# Patient Record
Sex: Male | Born: 1965 | Race: Black or African American | Hispanic: No | Marital: Married
Health system: Midwestern US, Community
[De-identification: ages and names within clinical notes are randomized; demographics above are authoritative.]

## PROBLEM LIST (undated history)

## (undated) DIAGNOSIS — Z992 Dependence on renal dialysis: Secondary | ICD-10-CM

## (undated) DIAGNOSIS — I639 Cerebral infarction, unspecified: Secondary | ICD-10-CM

## (undated) DIAGNOSIS — I10 Essential (primary) hypertension: Secondary | ICD-10-CM

## (undated) DIAGNOSIS — E785 Hyperlipidemia, unspecified: Secondary | ICD-10-CM

## (undated) DIAGNOSIS — N19 Unspecified kidney failure: Secondary | ICD-10-CM

## (undated) DIAGNOSIS — I6350 Cerebral infarction due to unspecified occlusion or stenosis of unspecified cerebral artery: Secondary | ICD-10-CM

## (undated) DIAGNOSIS — N184 Chronic kidney disease, stage 4 (severe): Secondary | ICD-10-CM

## (undated) DIAGNOSIS — M5432 Sciatica, left side: Secondary | ICD-10-CM

## (undated) HISTORY — DX: Hyperlipidemia, unspecified: E78.5

## (undated) MED ORDER — ONDANSETRON 4 MG TAB, RAPID DISSOLVE: 4 mg | ORAL_TABLET | ORAL | Status: AC

## (undated) MED ORDER — CLOPIDOGREL 75 MG TAB
75 mg | ORAL_TABLET | ORAL | Status: DC
Start: ? — End: 2013-12-13

## (undated) MED ORDER — AMLODIPINE 10 MG TAB
10 mg | ORAL_TABLET | ORAL | Status: DC
Start: ? — End: 2013-10-22

## (undated) MED ORDER — HYDRALAZINE 50 MG TAB
50 mg | ORAL_TABLET | ORAL | Status: DC
Start: ? — End: 2013-12-13

## (undated) MED ORDER — LABETALOL 200 MG TAB
200 mg | ORAL_TABLET | ORAL | Status: DC
Start: ? — End: 2013-12-13

## (undated) MED ORDER — PRAVASTATIN 10 MG TAB
10 mg | ORAL_TABLET | ORAL | Status: DC
Start: ? — End: 2013-12-13

---

## 1998-10-02 ENCOUNTER — Emergency Department (HOSPITAL_COMMUNITY): Admission: EM | Admit: 1998-10-02 | Discharge: 1998-10-02 | Payer: Self-pay | Admitting: Emergency Medicine

## 1998-10-02 ENCOUNTER — Encounter: Payer: Self-pay | Admitting: Emergency Medicine

## 1998-11-02 ENCOUNTER — Encounter: Admission: RE | Admit: 1998-11-02 | Discharge: 1998-11-02 | Payer: Self-pay | Admitting: *Deleted

## 2007-06-18 NOTE — Consults (Signed)
Name: [name], [name] Admitted: 06/18/2007  MR #: [id number] DOB: November 23, 1965  Account #: [id number] Age: 41  Consultant: Ottis Stain T. Guss Bunde, M.D. Location: 5E2 521 A     CONSULTATION REPORT    DATE OF CONSULTATION:  REFERRING PHYSICIAN:      REFERRING PHYSICIAN: Carol Ada, MD    REASON FOR CONSULTATION: Assistance with management of a patient with  advanced renal failure.    HISTORY OF PRESENT ILLNESS: Mr. [name] is a very pleasant 41 year old  African-American man who presented to the emergency room of [facility] ([facility] with complaints of persistent accelerated headaches, dizziness,  lightheadedness, and generally not feeling well. The patient was diagnosed  with hypertension several years ago. In 2006, he was told by his primary  care physician, Dr. Colvin Caroli, that his hypertension had effected his  kidneys. The patient was lost to medical followup since he lost his  medical insurance. In the interim time, the patient has been complaining  of intermittent headaches, periods of blurry vision, episodes of  lightheadedness and dizziness. He did not take any medications. He did  not have any blood work done between 2006 and now. For the last several  days, the patient has been feeling increasingly worse. Upon further  questioning, he admits that he developed nocturia 3 times per night,  polyuria, and excessive bubbling of his urine. The patient never noticed  gross hematuria. He does not have urgency, UTI symptoms. He does not have  hesitancy, incontinence or flank pain. The patient never passed a kidney  stone, but he admits having fresh blood per rectum on several occasions.  The patient is not aware of having hemorrhoids or rectal pain.    PAST MEDICAL HISTORY: Hypertension only.    ALLERGIES: No known medical allergies.    MEDICATION LIST  1. At present, the patient is taking only a large amount of nonsteroidal  anti-inflammatory medications from the class of ibuprofen and Aleve, up to   10 to 12 pills per day for control of his headaches.  2. He used to take Lotrel, the strength is unknown, but he stopped taking  it due to financial reasons.    SOCIAL HISTORY: The patient works currently at [employer]. He smokes up to 6 or 7 cigarettes per day. Drinks alcohol  occasionally, rarely. He is married, lives with his wife. The patient  denies illicit drug abuse.    FAMILY HISTORY: Strongly positive for hypertension and diabetes, but the  patient does not recall any family member on dialysis or with established  renal disease.    REVIEW OF SYSTEMS  CONSTITUTIONAL: No chills or fevers, but generalized weakness and recently  poor appetite. The patient lost some weight.  INTEGUMENT: No pruritus or easy bruisability.  HEENT: Headaches, dizziness, lightheadedness. Episodes of blurry vision.  No auditory disturbances.  NECK: No stiffness or odynophagia.  RESPIRATORY: Episodes of minimal shortness of breath, but no cough, sputum  production or hemoptysis.  CARDIOVASCULAR: The patient had at least 1 episode of chest pain. No  palpitations.  GASTROINTESTINAL: No nausea, vomiting, diarrhea, constipation. As  mentioned above, the patient had fresh blood per rectum.  MUSCULOSKELETAL: No joint pain. Patient admits having episodes of  swelling of lower extremities that had diminished significantly recently.  NEUROPSYCHIATRIC: The patient is not depressed. He denies seizure  disorder.  ENDOCRINOLOGIC: The patient denies diabetes, heat or cold intolerance.    PHYSICAL EXAMINATION  GENERAL: Young, black man who is alert  and oriented. He is well built,  well nourished. He does not appear to be in any apparent distress. Now he  is able to provide adequate history. Wife is at bedside.  VITAL SIGNS: Blood pressure 116/68, heart rate in the 80s, respirations 16  to 18.  SKIN: Warm and dry with good peripheral turgor. There are striae around   the shoulders and anterior chest, but no rashes, purpura or eczema.  HEENT: Eyes with anicteric sclerae. Mouth with moist oral mucosa. Ears  and nose are unremarkable on examination.  NECK: Supple. No increased JVP. No carotid bruit or thyromegaly.  HEART: S1, S2. Regular rate and rhythm. No friction rub or gallop.  ABDOMEN: Soft, nontender, nondistended. No hepatosplenomegaly. No CVA  tenderness.  EXTREMITIES: Without edema, cyanosis or clubbing. Peripheral pulses are  preserved.  NEUROLOGICAL: Exam shows no gross motor deficits or sensory deficits. The  patient has appropriate affect.    LABORATORY DATA: Sodium is 138, potassium 2.8, CO2 29, BUN 57,  creatinine 7.5, glucose 105. Hemoglobin 9.6, white blood count is  5.1. Lipase is 1163.    Ultrasound of the abdomen shows bilateral echogenic kidneys without  evidence of hydronephrosis.    IMPRESSIONS:  1. 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 The current level of serum creatinine could represent acute renal  failure or result of the natural progression of the patient's chronic  kidney disease. Clinically, the patient does not appear to be uremic.  Electrolytes with hypokalemia. The patient's hypokalemia could be related  to secondary hyperaldosteronism from uncontrolled hypertension. Of note,  is the patient currently has actually low normal pressure and he is  symptomatic with possibly orthostatic changes. Clinically, volume status  appears to be depleted.  2. Anemia of undetermined etiology. The patient gives a history of a  gastrointestinal bleed with blood loss, and he likely has a diminished  endogenous erythropoietin production.    RECOMMENDATIONS:  1. For his renal failure, monitor renal function with daily electrolytes   and record his urinary output. We need to establish patient's baseline  renal function. Obtain urine studies, especially protein to creatinine  ratio to rule out any primary glomerular disease.  2. For the patient's hypertension, for now I would hold Minoxidil. Given  alone, minoxidil can cause severe orthostatic hypotensive changes. The  patient's blood pressure at this level is low normal anyway. We will  decrease the Norvasc to 5 mg, start hydrochlorothiazide, and adjust  antihypertensive medications as indicated after monitoring. Check for  orthostasis.  3. Rule out primary hyperaldosteronism. Check aldosterone level and a  level after the infusion of normal saline.  4. Continue IV fluids. Change fluids to normal saline combined  with potassium chloride to replace hypokalemia. Give a dose of potassium  chloride orally and monitor potassium level.  5. For anemia. Stool cards, check for occult blood loss. Proceed with GI  evaluation. Check an iron level, replace. The patient may be a candidate  for Procrit injections.  6. Screen for secondary hyperparathyroidism with intact PTH, phosphorous,  and calcium level.    The patient and his wife were informed of possible need for renal  replacement therapy in the near future.    Avoid any nonsteroidal anti-inflammatory medications.    Thank you very much for the opportunity to be part of this patient's care.  Our service will follow up with  you closely.      E-Signed By  Jessie Foot. Guss Bunde, M.D. 07/12/2007 14:03    Fredick Schlosser T. Guss Bunde, M.D.    cc: Ottis Stain T. Guss Bunde, M.D.   Cristine Polio, MD        LTD/WMX; D: 06/18/2007 4:41 P; T: 06/18/2007 6:23 P; Job# [id number]

## 2007-06-21 NOTE — Consults (Signed)
Name: [name], [name] Admitted: 06/18/2007  MR #: [id number] DOB: 11/08/1965  Account #: [id number] Age: 41  Consultant: Bryon Lions, MD Location: PTU 402 B     CONSULTATION REPORT    DATE OF CONSULTATION: 06/21/2007  REFERRING PHYSICIAN:      REFERRING PHYSICIAN: Carol Ada, MD    HISTORY OF PRESENT ILLNESS: This is a 41 year old gentleman with an  abnormal myocardial perfusion study. We are asked to see on this basis. The  patient has been here since 11/21 with complaints of headache and  dizziness, shortness of breath, tightness in his chest, nausea, and other  complaints. He has been found to have malignant or accelerated  hypertension, severe renal failure, profound anemia, and many other  laboratory abnormalities.    The patient has not seen a doctor for 1-1/2 to 2 years, has not taken  medicines for 2 years on the basis of his loss of his medical insurance. He  was established as hypertensive prior to this, and there was some mention  of renal issues then but not to this level. There is no personal history of  myocardial infarction, stroke, or rheumatic fever.    The patient smokes about 1/3 to 1/2 pack of cigarettes per day. He drinks  alcohol daily. He has not used any illegal drugs in several months.    ALLERGIES TO MEDICATIONS: None.    There is no formal diagnosis of asthma or emphysema or sleep apnea,  although the patient's wife says that he snores very loudly.    SOCIAL HISTORY: The patient is married, lives with his wife. He has 3  children including an infant less than a year old. He works at [employer]. Again, he is on no medications. His dizziness and lightheadedness and  headaches have improved since admission.    As part of his evaluation the patient had a myocardial perfusion study with  Persantine and was found to have a low ejection fraction on both the  resting and stress images and an area "suspicious for" mild anterior  ischemia.     REVIEW OF SYSTEMS: Negative for dyspnea on ordinary exertion, orthopnea,  paroxysmal nocturnal dyspnea, diaphoresis, frank syncope, edema, abdominal  pain. He dose have frequent episodes of nausea and vomiting and was having  bright red blood per rectum up until 2 weeks ago when he stopped taking so  many aspirin and other medications for pain. He apparently has not seen any  bright red blood since admission.    PHYSICAL EXAMINATION:  GENERAL: This is a well-developed, very affable, 41 year old gentleman in  no respiratory distress with unlabored breathing.  VITAL SIGNS: Include a blood pressure of 201/120, pulse of 88, respirations  18, temperature 97.8, saturations 98% on room air.  HEENT: Unremarkable. His sclerae are not icteric.  NECK: Supple. Normal carotid pulsations bilaterally. No bruits. No  adenopathy. Normal thyroid.  CHEST WALL: Nontender.  LUNGS: Scattered expiratory wheezes.  HEART: Regular, moderate rhythm. Moderate S4 gallop. No S3 gallop. No  friction rub. No neck vein distention. No hepatojugular reflux, no thrills,  lifts, or heaves.  ABDOMEN: Slightly distended, soft, nontender. No bruits, no organomegaly.  NEUROLOGIC: Normal.  EXTREMITIES: Trace pretibial edema. Normal pedal pulses. Normal brachial  pulses, normal radial pulses.  NEUROLOGIC: Nonfocal and unremarkable.    EKG: Left ventricular hypertrophy, nondiagnostic ST-T changes.    CHEST X-RAY: Mild cardiomegaly, prominence of aortic arch.    FOLLOWUP CT: The chest showed no aneurysm of the thoracic  aorta.    LABORATORY WORK: Indicates a very high creatinine; it was 8.6 on 11/22.  Most recent one is 7.5. His hemoglobin is down to 8.6, hematocrit 24.9,  platelet count 110,000. White count is normal. LDL cholesterol is 100.8,  HDL cholesterol 37.    IMPRESSION:  1. This patient has an abnormal myocardial perfusion study suggesting  coronary artery disease and coronary insufficiency; in addition, his left  ventricular function is depressed.   2. The patient has advanced renal failure, and his creatinine has improved  some from admission to its current level but it is still 7.5 creatinine.  3. Thrombocytopenia. He also has an anemia and demonstrated bright red  blood per rectum until 2 weeks ago. He also has substance issues with heavy  tobacco and alcohol use.    RECOMMENDATIONS: Medical therapy for his coronary disease for now including  a significant dose of beta-blockers, aspirin, and Plavix if his GI  evaluation is negative for active GI bleeding. If the patient has  breakthrough symptoms on medical therapy, then he should have coronary  angiography but this will guarantee renal failure and dialysis. A GI  evaluation should be done and he should also get transfusions for  hemoglobin less than 8. I will explain these issues to the patient and  follow along with you.    Thank you for this referral.      E-Signed By  Bryon Lions, MD 08/01/2007 13:20    Bryon Lions, MD    cc: Bryon Lions, MD   Cristine Polio, MD        SNA/WMX; D: 06/21/2007 8:17 P; T: 06/21/2007 9:37 P; Job# [id number]

## 2007-08-04 NOTE — Op Note (Signed)
Name: [name], [name]  MR #: [id number] Surgeon: Levonne Spiller,   M.D.  Account #: [id number] Surgery Date: 06/22/2007  DOB: 1966/07/27  Age: 42 Location: PTU 402 B     OPERATIVE REPORT      PROCEDURE: Esophagogastroduodenoscopy with biopsy.    PREOPERATIVE DIAGNOSIS: A 42 year old male with history of nausea and  vomiting over 10- to 12-day period with 10 to 12 episodes per day. The  patient is anemic, has a history of renal failure. H and P was done prior  to examination.    ANESTHESIA RISK: 3.    AIRWAY ASSESSMENT: 2.    MEDICATIONS: Fentanyl and Versed. See my notes for dosages.    MONITORING: Conscious anesthesia monitored including O2 saturation, blood  pressure, airway, and EKG monitoring, all remained stable during the  examination.    ANESTHESIA PRE-CHECK AND PLAN: No change. Time-out occurred.    CONSENT: Complications of this procedure were discussed including  perforation and need for surgery.    DESCRIPTION OF PROCEDURE: With the patient in the left lateral position,  an Olympus video endoscope was passed without difficulty from the patient's  oropharynx into the patient's esophagus. Oropharynx was unremarkable.  Esophagus throughout its entirety was unremarkable. No significant  esophagitis was noted. This was passed past the EG junction into the  patient's stomach. There was some mild erythema noted in the antrum of the  patient's stomach, but no evidence of any ulcerations. The instrument was  passed through the pylorus into the duodenum. The duodenum was  unremarkable in the bulb and 2nd portion of the duodenum. The instrument  was pulled back into the stomach and retroflexed. No additional findings  were noted in the fundus or cardia. Representative biopsies were taken for  Helicobacter pylori testing. The instrument was withdrawn. The patient  tolerated the entire procedure well.    ESTIMATED BLOOD LOSS:    SPECIMENS REMOVED:          E-Signed By   Levonne Spiller, M.D. 08/04/2007 13:52  Levonne Spiller, M.D.    cc: Cristine Polio, MD   Levonne Spiller, M.D.      HJS/FI3; D: 08/03/2007 11:48 P; T: 08/04/2007 5:11 A; Job# [id number]

## 2007-08-04 NOTE — Op Note (Signed)
Op Notes signed by  at 08/04/07 1352                  Author: Charolette Forward, MD  Service: --  Author Type: Physician            Filed:   Date of Service: 08/04/07 0511  Status: Signed          <!--EPICS--> Name:      [name], Alexander S.<BR> MR #:      [id number]                    Surgeon:        Levonne Spiller, <BR> M.D.<BR> Account  #: [id number]                 Surgery Date:   06/22/2007<BR> DOB:       1965/10/07<BR> Age:       42                           Location:       PTU 402 B<BR> <BR>                              OPERATIVE REPORT<BR> <BR> <BR> PROCEDURE:  Esophagogastroduodenoscopy  with biopsy.<BR> <BR> PREOPERATIVE DIAGNOSIS:  A 42 year old male with history of nausea and<BR> vomiting over 10- to 12-day period with 10 to 12 episodes per day.  The<BR> patient is anemic, has a history of renal failure.  H and P was done prior<BR>  to examination.<BR> <BR> ANESTHESIA RISK:  3.<BR> <BR> AIRWAY ASSESSMENT:  2.<BR> <BR> MEDICATIONS:  Fentanyl and Versed.  See my notes for dosages.<BR> <BR> MONITORING:  Conscious anesthesia monitored including O2 saturation, blood<BR> pressure, airway,  and EKG monitoring, all remained stable during the<BR> examination.<BR> <BR> ANESTHESIA PRE-CHECK AND PLAN:  No change.  Time-out occurred.<BR> <BR> CONSENT:  Complications of this procedure were discussed including<BR> perforation and need for surgery.<BR>  <BR> DESCRIPTION OF PROCEDURE:  With the patient in the left lateral position,<BR> an Olympus video endoscope was passed without difficulty from the patient's<BR> oropharynx into the patient's esophagus.  Oropharynx was unremarkable.<BR> Esophagus throughout  its entirety was unremarkable.  No significant<BR> esophagitis was noted.  This was passed past the EG junction into the<BR> patient's stomach.  There was some mild erythema noted in the antrum of the<BR> patient's stomach, but no evidence of any ulcerations.   The instrument was<BR> passed through the  pylorus into the duodenum.  The duodenum was<BR> unremarkable in the bulb and 2nd portion of the duodenum.  The instrument<BR> was pulled back into the stomach and retroflexed.  No additional findings<BR> were  noted in the fundus or cardia.  Representative biopsies were taken for<BR> Helicobacter pylori testing.  The instrument was withdrawn.  The patient<BR> tolerated the entire procedure well.<BR> <BR> ESTIMATED BLOOD LOSS:<BR> <BR> SPECIMENS REMOVED:<BR>  <BR> <BR> <BR> <BR> E-Signed By<BR> Levonne Spiller, M.D. 08/04/2007 13:52<BR> Levonne Spiller, M.D.<BR> <BR> cc:   Desmond Dike Haithcock, MD<BR>       Levonne Spiller, M.D.<BR> <BR> <BR> HJS/FI3; D: 08/03/2007 11:48 P; T: 08/04/2007  5:11 A; Job#  [id number]<OZ> <!--EPICE-->

## 2007-09-19 LAB — CBC WITH AUTOMATED DIFF
ABS. BASOPHILS: 0 10*3/uL (ref 0.0–0.1)
ABS. EOSINOPHILS: 0.1 10*3/uL (ref 0.0–0.4)
ABS. LYMPHOCYTES: 1.4 10*3/uL (ref 0.8–3.5)
ABS. MONOCYTES: 0.6 10*3/uL (ref 0–1.0)
ABS. NEUTROPHILS: 1.9 10*3/uL (ref 1.8–8.0)
BASOPHILS: 1 % (ref 0–1)
EOSINOPHILS: 2 % (ref 0–7)
HCT: 34.6 % — ABNORMAL LOW (ref 36.6–50.3)
HGB: 11.5 g/dL — ABNORMAL LOW (ref 12.1–17.0)
LYMPHOCYTES: 35 % (ref 12–49)
MCH: 31.1 PG (ref 26.0–34.0)
MCHC: 33.2 g/dL (ref 30.0–35.0)
MCV: 93.5 FL (ref 80.0–99.0)
MONOCYTES: 14 % — ABNORMAL HIGH (ref 5–13)
NEUTROPHILS: 48 % (ref 32–75)
PLATELET: 224 10*3/uL (ref 150–400)
RBC: 3.7 M/uL — ABNORMAL LOW (ref 4.10–5.70)
RDW: 13.3 % (ref 11.5–14.5)
WBC: 4 10*3/uL — ABNORMAL LOW (ref 4.1–11.1)

## 2007-09-19 LAB — TROPONIN I: Troponin-I, Qt.: 0.11 ng/mL — ABNORMAL HIGH (ref 0.0–0.05)

## 2007-09-19 LAB — METABOLIC PANEL, COMPREHENSIVE
A-G Ratio: 0.8 — ABNORMAL LOW (ref 1.1–2.2)
ALT (SGPT): 30 U/L (ref 30–65)
AST (SGOT): 21 U/L (ref 15–37)
Albumin: 3.7 g/dL (ref 3.5–5.0)
Alk. phosphatase: 85 U/L (ref 50–136)
Anion gap: 7 (ref 5–15)
BUN/Creatinine ratio: 7 — ABNORMAL LOW (ref 12–20)
BUN: 38 MG/DL — ABNORMAL HIGH (ref 6–20)
Bilirubin, total: 0.3 MG/DL (ref 0–1.0)
CO2: 28 MMOL/L (ref 21–32)
Calcium: 8.7 MG/DL (ref 8.5–10.1)
Chloride: 100 MMOL/L (ref 97–108)
Creatinine: 5.8 MG/DL — ABNORMAL HIGH (ref 0.6–1.3)
GFR est AA: 14 mL/min/{1.73_m2} — ABNORMAL LOW (ref >60)
GFR est non-AA: 12 mL/min/{1.73_m2} — ABNORMAL LOW (ref >60)
Globulin: 4.6 g/dL — ABNORMAL HIGH (ref 2.0–4.0)
Glucose: 85 MG/DL (ref 75–110)
Potassium: 3 MMOL/L — ABNORMAL LOW (ref 3.5–5.1)
Protein, total: 8.3 g/dL — ABNORMAL HIGH (ref 6.4–8.2)
Sodium: 135 MMOL/L — ABNORMAL LOW (ref 136–145)

## 2007-09-19 LAB — CK-MB,QUANT.
CK - MB: 1.5 NG/ML (ref 0–5)
CK-MB Index: 0.5 (ref 0–2.5)

## 2007-09-19 LAB — CK W/ REFLX CKMB: CK: 284 U/L — ABNORMAL HIGH (ref 35–232)

## 2007-09-19 NOTE — Consults (Signed)
Name: AZZAN, BUTLER Admitted: 09/19/2007  MR #: [id number] DOB: [birth year]  Account #: [id number] Age: 42  Consultant: Sharon Seller A. Colvin Caroli, M.D. Location:     CONSULTATION REPORT    DATE OF CONSULTATION:  REFERRING PHYSICIAN:      ADDENDUM    His laboratory values are as follows (which I did not mention in my initial  evaluation): Hemoglobin is 11.5, WBC 4.0, MCV was 93.5. Abnormalities on  the comprehensive panel limited to potassium of 3.0, BUN and creatinine 38  and 5.8, respectively. CO2 was 28. Total protein 8.3, globulin 4.6. CK  was elevated at 284 with normal isoenzymes. Troponin was slightly  elevated, and we will repeat this prior to discharge to make sure that the  trend is not one of increasing.      E-Signed By  Christean Grief Colvin Caroli, M.D. 09/22/2007 12:51    Sharon Seller A. Colvin Caroli, M.D.    cc: Christean Grief. Colvin Caroli, M.D.        Kipp Brood; D: 09/19/2007 4:00 P; T: 09/19/2007 5:03 P; DOC# [id number]; Job#  [id number]

## 2007-09-19 NOTE — Consults (Signed)
Name: [name], [name] Admitted: 09/19/2007  MR #: [id number] DOB: 17-Jan-1966  Account #: [id number] Age: 42  Consultant: Sharon Seller A. Colvin Caroli, M.D. Location:     CONSULTATION REPORT    DATE OF CONSULTATION: 09/19/2007  REFERRING PHYSICIAN:      HISTORY OF PRESENT ILLNESS: The patient is a 42 year old gentleman who has  a history of severe primary hypertension. Noted on Monday, which was 6 days  ago, blurred vision, initially involved the right eye, but subsequently  began involving both eyes, and did not involve his entire visual field.  Based on his history, it sounded like he indeed had visual field cuts. He  saw his optometrist/ophthalmologist on Friday where the exam was normal.  Because of the persistence of the symptoms, and the new involvement of the  contralateral eye, he presented to the emergency room. When I evaluated  him, there was an obvious visual field defect that existed in both eyes.  His acuity was adequate in the portion of his visual field that was not  involved.    He has severe high blood pressure and is on polydrug therapy. When he saw  his optometrist/ophthalmologist on Friday, it was suggested that the  current symptoms might be medication related, and he immediately  discontinued the majority of them. Blood pressure was markedly elevated  today presumably as a direct result of this. Historically, the patient  states that with his current blood pressure regimen, his pressures  systolically have generally been in the 140 range and diastolics have  generally been in the mid 90 range.    PAST MEDICAL HISTORY: As a direct result of his long-standing high blood  pressure, he has developed left ventricular hypertrophy, fairly severe.  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  He is being followed by Dr. Guss Bunde of nephrology.     During his last hospitalization, at which time the severity of this renal  failure was manifest, he had esophagogastroduodenoscopy which revealed  gastritis, a stress Cardiolite scan which was negative, CT scan of the  chest which revealed tortuosity of the aorta, abdominal ultrasound which is  negative. It should be known that the patient's secondary complications  were all related to his noncompliance. I saw him in the office years ago,  and at that time he had very mild elevation of his creatinine and had a  good opportunity to arrest the process, but he never followed up and as a  result never took any blood pressure medications on a consistent basis.    SOCIAL HISTORY: He is married and has 3 children. He formerly worked at  [employer], but unfortunately lost his job most recently. He smokes  cigarettes, approximately a third of a pack a day, and drinks daily, 1 or 2  beers at best, and this may have changed of late.    ALLERGIES: He has no known allergies.    CURRENT MEDICATIONS INCLUDE:  1. Omeprazole 20 mg daily.  2. Amlodipine 10 mg daily.  3. Hydralazine 40 mg t.i.d.  4. Metoprolol tartrate 50 mg b.i.d.  5. Rocaltrol 0.25 mcg daily.    REVIEW OF SYSTEMS: HEENT: See history of present illness.  CARDIOVASCULAR: See past medical history. PULMONARY: Negative.  GASTROINTESTINAL: Negative. GENITOURINARY: Negative. NEUROLOGICAL: See  history of present illness. MUSCULOSKELETAL: Negative.    FAMILY HISTORY: Father died at age 108 from cancer of the lung. Mom died  at 54 from breast cancer. He has 2 brothers alive and well.    PHYSICAL EXAMINATION:  VITAL SIGNS: Temperature 98.4 degrees Fahrenheit, pulse 80, respiratory  rate 16, initial blood pressure 215/138.  GENERAL: The patient is a 42 year old well-developed, well-nourished,  unhealthy-looking male in no acute respiratory distress at rest. He is  alert and oriented x3, cooperative, and quite pleasant.  SKIN: Warm, dry, without pallor or cyanosis.   HEENT: Pupils equal, round, and reactive to light. He has fair  dentition.  NECK: There is no JVD, HJR, thyromegaly, carotid bruits, or  lymphadenopathy noted.  CHEST: Clear to A and P.  HEART: PMI is not palpable. S1 and S2 within normal limits. No murmurs  or gallops noted.  ABDOMEN: Soft, without obvious organomegaly, masses, or areas of  tenderness noted.  EXTREMITIES: Pulses full and symmetrical.  NEUROLOGIC: No focality is noted to his exam. In attempting to do visual  fields, there is a definite visual field cut midline involving the medial  aspect on the right and the lateral aspect on the left.    IMPRESSION:  1. 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  2. Severe pulmonary hypertension, currently elevated, secondary to  noncompliance.  3. Left ventricular hypertrophy secondary to long-standing uncontrolled  hypertension.  4. Chronic renal failure secondary to hypertensive nephrosclerosis.    DISPOSITION: Given the onset of the symptoms, which is over a week ago,  with progression only to the contralateral eye, a CT scan of the brain will  be obtained. If there is no hemorrhagic bleed as well as no other  unsuspecting defect, the patient will be discharged home to follow up as an  outpatient. He is already on aspirin 81 mg which I will increase to 325  mg. He will return to the office on Monday for followup.    Poorly controlled blood pressure is related to his noncompliance, and the  patient will resume is antihypertensive medication, and given the fact that  during the week and earlier his pressures had been in a reasonable range,  systolics in the 140s and diastolics in the 90 range, I do not anticipate  him not being able to get back to that baseline with resumption of his  current regimen. Prior to discharge, however, he was given clonidine and I  anticipate a modest drop.      E-Signed By  Christean Grief Colvin Caroli, M.D. 09/22/2007 12:51     Sharon Seller A. Colvin Caroli, M.D.    cc: Christean Grief. Colvin Caroli, M.D.        Kipp Brood; D: 09/19/2007 3:47 P; T: 09/19/2007 5:28 P; DOC# [id number]; Job#  [id number]

## 2007-09-20 LAB — TROPONIN I: Troponin-I, Qt.: 0.1 ng/mL — ABNORMAL HIGH (ref 0.0–0.05)

## 2008-09-01 LAB — CELL COUNT, CSF
CSF RBCs: 1 /mm3 — ABNORMAL HIGH
CSF RBCs: 17 /mm3 — ABNORMAL HIGH
CSF TUBE NO.: 1
CSF TUBE NO.: 3
CSF WBCs: 0 /mm3 (ref 0–5)
CSF WBCs: 3 /mm3 (ref 0–5)

## 2008-09-01 LAB — GLUCOSE, CSF
Glucose,CSF: 65 MG/DL (ref 40–70)
Tube No.: 3

## 2008-09-01 LAB — GLUCOSE, RANDOM: Glucose: 92 MG/DL (ref 50–100)

## 2008-09-01 LAB — PROTEIN, CSF
Protein,CSF: 33 MG/DL (ref 15–45)
Tube No.: 3

## 2008-09-01 NOTE — Consults (Signed)
Name: [name], [name] Admitted: 09/01/2008  MR #: [id number] DOB: 12/07/1965  Account #: [id number] Age: 43  Consultant: Clydene Laming, M.D. Location:     CONSULTATION REPORT    DATE OF CONSULTATION:  REFERRING PHYSICIAN:      REFERRING PHYSICIAN:    The patient had an abnormal neurologic examination with visual loss,  distortion of vision. He had imaging studies of the brain a year ago that  showed temporal lobe inflammation that appeared to be demyelinating. The  patient had no major symptoms of this type of visual change until late last  year. For that reason, we have scheduled lumbar puncture several times to  try to help coordinate, to try to set up an LP. Today this was accomplished  to rule out any additional signs of demyelination. He has not have MRI  since last year.    We did complete LP without complications, immediate type. An 18-gauge  needle was passed with standard sterile procedure to CSF acquisition.  Approximately 8 to 10 mL of spinal fluid was obtained. Opening pressure was  200 mm of water, closing pressure was 180 mm of water. The fluid cleared  after the first slightly blood painted tube. No xanthochromia was seen.  The patient's blood work was sent for standard MS panel to correlate with  the patient's CSF. The patient tolerated the procedures without any  immediate consequences outside of mild anxiety reaction following the  procedure. He tolerated it without any other major problems.    His wife was present throughout the whole procedure and was quite a helpful  assistant as we obtained patient's CSF.        Reviewed on 09/01/2008 1:10 PM      E-Signed By  Clydene Laming, M.D. 09/04/2008 09:49    J. Jerrilyn Cairo, M.D.    cc: Christean Grief. Colvin Caroli, M.D.   Clydene Laming, M.D.        JKH/WMX; D: 09/01/2008 1:00 P; T: 09/01/2008 1:56 P; DOC# [id number]; Job#  [id number]

## 2008-09-06 LAB — MULTIPLE SCLEROSIS EVAL
ALBUMIN INDEX(CSF IgG): 4.7 ratio (ref 0.0–9.0)
ALBUMIN,SERUM: 3840 mg/dL (ref 3500–5200)
Albumin, CSF: 18 mg/dL (ref 0–35)
CSF IgG/ALB RATIO: 0.18 ratio (ref 0.09–0.25)
CSF OLIGOCLONAL BANDS: NEGATIVE
IMMUNOGLOBULIN G: 1780 mg/dL — ABNORMAL HIGH (ref 768–1632)
IgG INDEX: 0.4 ratio (ref 0.28–0.66)
IgG SYNTHESIS RATE: <0 mg/d (ref <=8.0)
Immunoglobulin G,CSF: 3.3 mg/dL (ref 0.0–6.0)
MYELIN BASIC PROTEIN: 0.46 ng/mL (ref 0.00–1.10)

## 2009-07-18 DIAGNOSIS — I1310 Hypertensive heart and chronic kidney disease without heart failure, with stage 1 through stage 4 chronic kidney disease, or unspecified chronic kidney disease: Secondary | ICD-10-CM

## 2009-07-18 NOTE — Progress Notes (Signed)
TRANSFER - IN REPORT:    Verbal report received from Kristen RN(name) on [name]  being received from ED(unit) for routine progression of care      Report consisted of patient???s Situation, Background, Assessment and   Recommendations(SBAR).     Information from the following report(s) ED Summary and Recent Results was reviewed with the receiving nurse.    Opportunity for questions and clarification was provided.      Assessment completed upon patient???s arrival to unit and care assumed.

## 2009-07-18 NOTE — ED Notes (Signed)
Pt was brought in by squad for chest pain and right arm pain. Pt has received 1 SL NTG with no relief. Pt is having nausea and SOB. Pt cannot take Asprin due to renal failure. STEMI not initiated in the field.

## 2009-07-18 NOTE — H&P (Signed)
History and Physical    Subjective:     [name] is a 43 y.o. African American male who presents with R arm tightness and very elevated BP. He has not been taking his meds appropriately and also has been eating a high salt diet. Pt denies chest pain, dyspnea, and edema.    Past Medical History   Diagnosis Date   ??? Chronic kidney disease    ??? Hypertension    ??? Anemia    ??? Coronary artery disease         Past Surgical History   Procedure Date   ??? Abdomen surgery proc unlisted    ??? Hx orthopaedic    ??? Pr acne surgery of skin abscess        No family history on file.   History   Substance Use Topics   ??? Smoking status: Current Everyday Smoker -- 0.2 packs/day   ??? Smokeless tobacco: Not on file   ??? Alcohol Use: 1.5 oz/week     3 Cans of beer per week         Prior to Admission medications    Medication Sig Start Date End Date Taking? Authorizing Provider   AMLODIPINE BESYLATE (NORVASC PO) Take 20 mg by mouth daily. Indications: HYPERTENSION 07/18/09  Yes Phys Other, MD   lisinopril (PRINIVIL, ZESTRIL) 20 mg tablet Take  by mouth daily. Indications: HYPERTENSION   Yes Phys Other, MD   hydrALAZINE (APRESOLINE) 50 mg tablet Take 50 mg by mouth three (3) times daily. Indications: HYPERTENSION   Yes Phys Other, MD   metoprolol (LOPRESSOR) 25 mg tablet Take 50 mg by mouth two (2) times a day. Indications: HYPERTENSION   Yes Phys Other, MD   clopidogrel (PLAVIX) 75 mg tablet Take 75 mg by mouth daily. Indications: UNSTABLE ANGINA PECTORIS   Yes Phys Other, MD       No Known Allergies     Review of Systems:  A comprehensive review of systems was negative except for that written in the History of Present Illness.     Objective:     Intake and Output:            Physical Exam:   BP 187/120   Pulse 74   Resp 16   Ht 6'   Wt 186 lb   SpO2 100%  General appearance: alert, cooperative, no distress, appears stated age   Neck: supple, symmetrical, trachea midline, no adenopathy, thyroid: not enlarged, symmetric, no tenderness/mass/nodules, no carotid bruit and no JVD  Lungs: clear to auscultation bilaterally  Heart: regular rate and rhythm, S1, S2 normal, no murmur, click, rub or gallop  Abdomen: soft, non-tender. Bowel sounds normal. No masses,  no organomegaly  Extremities: extremities normal, atraumatic, no cyanosis or edema  Neurologic: Grossly normal    ECG:  LVH, strain pattern     Data Review:   Recent Results (from the past 24 hour(s))   CBC WITH AUTOMATED DIFF    Collection Time    07/18/09  8:40 PM   Component Value Range   ??? WBC 6.4  4.1 - 11.1 (K/uL)   ??? RBC 3.70 (*) 4.10 - 5.70 (M/uL)   ??? HGB 11.6 (*) 12.1 - 17.0 (g/dL)   ??? HCT 34.4 (*) 36.6 - 50.3 (%)   ??? MCV 93.0  80.0 - 99.0 (FL)   ??? MCH 31.4  26.0 - 34.0 (PG)   ??? MCHC 33.7  30.0 - 36.5 (g/dL)   ??? RDW  13.7  11.5 - 14.5 (%)   ??? PLATELET 174  150 - 400 (K/uL)   ??? NEUTROPHILS 63  32 - 75 (%)   ??? LYMPHOCYTES 26  12 - 49 (%)   ??? MONOCYTES 9  5 - 13 (%)   ??? EOSINOPHILS 2  0 - 7 (%)   ??? BASOPHILS 0  0 - 1 (%)   ??? ABSOLUTE NEUTS 4.0  1.8 - 8.0 (K/UL)   ??? ABSOLUTE LYMPHS 1.7  0.8 - 3.5 (K/UL)   ??? ABSOLUTE MONOS 0.6  0.0 - 1.0 (K/UL)   ??? ABSOLUTE EOSINS 0.1  0.0 - 0.4 (K/UL)   ??? ABSOLUTE BASOS 0.0  0.0 - 0.1 (K/UL)   CK W/ REFLX CKMB    Collection Time    07/18/09  8:40 PM   Component Value Range   ??? CK 209  35 - 232 (U/L)   TROPONIN I    Collection Time    07/18/09  8:40 PM   Component Value Range   ??? Troponin-I, Qt. 0.12 (*) <0.05 (ng/mL)   METABOLIC PANEL, COMPREHENSIVE    Collection Time    07/18/09  8:40 PM   Component Value Range   ??? Sodium 140  136 - 145 (MMOL/L)   ??? Potassium 4.0  3.5 - 5.1 (MMOL/L)   ??? Chloride 109 (*) 97 - 108 (MMOL/L)   ??? CO2 19 (*) 21 - 32 (MMOL/L)   ??? Anion gap 12  5 - 15 (mmol/L)   ??? Glucose 87  65 - 100 (MG/DL)   ??? BUN 29 (*) 6 - 20 (MG/DL)   ??? Creatinine 4.3 (*) 0.6 - 1.3 (MG/DL)   ??? BUN/Creatinine ratio 7 (*) 12 - 20 ( )    ??? GFR est AA 20 (*) >60 (ml/min/1.28m2)   ??? GFR est non-AA 16 (*) >60 (ml/min/1.68m2)   ??? Calcium 8.7  8.5 - 10.1 (MG/DL)   ??? Bilirubin, total 0.3  0.2 - 1.0 (MG/DL)   ??? ALT 19  12 - 78 (U/L)   ??? AST 20  15 - 37 (U/L)   ??? Alk. phosphatase 55  50 - 136 (U/L)   ??? Protein, total 8.3 (*) 6.4 - 8.2 (g/dL)   ??? Albumin 3.8  3.5 - 5.0 (g/dL)   ??? Globulin 4.5 (*) 2.0 - 4.0 (g/dL)   ??? A-G Ratio 0.8 (*) 1.1 - 2.2 ( )   PTT    Collection Time    07/18/09  8:40 PM   Component Value Range   ??? aPTT 29.4  24.0 - 33.0 (sec)   PROTHROMBIN TIME    Collection Time    07/18/09  8:40 PM   Component Value Range   ??? INR 1.0  0.9 - 1.1 ( )   ??? Prothrombin Time-PT 10.7  9.0 - 11.0 (SECS)   BNP    Collection Time    07/18/09  8:40 PM   Component Value Range   ??? B-NP 116 (*) 0 - 100 (pg/mL)   POC TROPONIN-I    Collection Time    07/18/09  8:41 PM   Component Value Range   ??? Troponin-I (POC) 0.04  0.00 - 0.08 (ng/mL)             Assessment:     Patient Active Hospital Problem List:  *Accelerated hypertension (07/18/2009)      Plan:     1) Accelerated Hypertension  2) Get back on previous PO meds and on a  low salt diet.   3) Moniter, R/O MI    Signed By: Barron Schmid, MD     July 18, 2009

## 2009-07-18 NOTE — ED Provider Notes (Addendum)
HPI Comments: This is a 43 year old male with a previous medical hx significant for chronic kidney disease, HTN, and anemia who presents to the ED via EMS for chest flutter and arm pain.  Pt reports constant right arm pain for the past 1 hour which began at work, described as a dull ache, rated as 7/10.  Pt also reports fluttering in his chest.  Pt also reports dizziness and lightheadedness.  EMS reports pt was found to have BP of 222/156, given 1 Nitro, brought down to 190/146.  Pt is followed by Dr. Brooke Bonito.  Pt denies chest pain, SOB, abdominal pain, headache, cough, congestion, and rhinorrhea.  Note written by Larose Hires, Scribe, as dictated by Christella Noa., MD 8:42 PM      The history is provided by the patient and the EMS personnel.        Past Medical History   Diagnosis Date   ??? Chronic kidney disease    ??? Hypertension    ??? Anemia           Past Surgical History   Procedure Date   ??? Abdomen surgery proc unlisted    ??? Hx orthopaedic    ??? Pr acne surgery of skin abscess            No family history on file.     History   Social History   ??? Marital Status: Married     Spouse Name: N/A     Number of Children: N/A   ??? Years of Education: N/A   Occupational History   ??? Not on file.   Social History Main Topics   ??? Smoking status: Current Everyday Smoker -- 0.2 packs/day   ??? Smokeless tobacco: Not on file   ??? Alcohol Use: 1.5 oz/week     3 Cans of beer per week   ??? Drug Use: Yes     Special: Marijuana   ??? Sexually Active:    Other Topics Concern   ??? Not on file   Social History Narrative   ??? No narrative on file           ALLERGIES: Review of patient's allergies indicates no known allergies.      Review of Systems   Constitutional: Negative for fever, chills and appetite change.   HENT: Negative for congestion, rhinorrhea, neck pain and neck stiffness.    Eyes: Negative for discharge and redness.   Respiratory: Negative for cough, shortness of breath and stridor.     Cardiovascular: Positive for chest pain and palpitations. Negative for leg swelling.   Gastrointestinal: Negative for nausea, vomiting, abdominal pain and diarrhea.   Genitourinary: Negative for dysuria, hematuria, flank pain and decreased urine volume.   Neurological: Positive for dizziness and light-headedness. Negative for syncope, weakness and headaches.   Psychiatric/Behavioral: Negative for behavioral problems, confusion and agitation.   Note written by Larose Hires, Scribe, as dictated by Christella Noa., MD 8:43 PM    Filed Vitals:    07/18/2009  8:30 PM   BP: 212/129   Pulse: 72   Resp: 24   Height: 6' (1.829 m)   Weight: 186 lb (84.369 kg)   SpO2: 100%              Physical Exam   Constitutional: He is oriented to person, place, and time. He appears well-developed and well-nourished.   HENT:   Head: Normocephalic and atraumatic.   Mouth/Throat: Oropharynx is clear and moist. No oropharyngeal exudate.  Eyes: Conjunctivae are normal. No scleral icterus.   Neck: Neck supple. No thyromegaly present.   Cardiovascular: Normal rate, regular rhythm and normal heart sounds.  Exam reveals no gallop and no friction rub.    No murmur heard.  Pulmonary/Chest: Effort normal and breath sounds normal. No stridor. No respiratory distress. He has no wheezes. He has no rales.   Abdominal: Soft. Bowel sounds are normal. No tenderness. He has no rebound and no guarding.   Musculoskeletal: Normal range of motion.   Lymphadenopathy:     He has no cervical adenopathy.   Neurological: He is alert and oriented to person, place, and time.   Skin: Skin is warm and dry.   Psychiatric: He has a normal mood and affect.        Coding    Procedures    8:44 PM  EKG: (ED MD Interpretation)  NSR, 66, LVH with strain.  Note written by Larose Hires, Scribe, as dictated by Christella Noa., MD 8:45 PM    10:01 PM  CONSULT NOTE:   Jceon Alverio D., MD spoke to Dr. Bing Ree concerning the patient. The patient's history, presentation, physical findings, and results were all discussed. Will come to see and admit.  Note written by Larose Hires, Scribe, as dictated by Christella Noa., MD 10:02 PM

## 2009-07-18 NOTE — ED Notes (Signed)
Pt has urinal at bedside. Pt states he is unable to void at this time.

## 2009-07-18 NOTE — Progress Notes (Signed)
TRANSFER - OUT REPORT:    Verbal report given to Ebony on Carla Whilden Rainwater  being transferred to PTU 406A for routine progression of care       Report consisted of patient???s Situation, Background, Assessment and   Recommendations(SBAR).     Information from the following report(s) SBAR, ED Summary, [facility] and Recent Results was reviewed with the receiving nurse.    Opportunity for questions and clarification was provided.

## 2009-07-19 ENCOUNTER — Inpatient Hospital Stay
Admit: 2009-07-19 | Discharge: 2009-07-20 | Disposition: A | Discharge status: Home / Self Care | Payer: PRIVATE HEALTH INSURANCE | Attending: Internal Medicine | Admitting: Internal Medicine

## 2009-07-19 LAB — URINALYSIS W/ REFLEX CULTURE
Bacteria: NEGATIVE /HPF
Bilirubin: NEGATIVE
Blood: NEGATIVE
Glucose: NEGATIVE MG/DL
Ketone: NEGATIVE MG/DL
Leukocyte Esterase: NEGATIVE
Nitrites: NEGATIVE
Protein: 30 MG/DL — AB
Specific gravity: 1.02 (ref 1.003–1.030)
Urobilinogen: 0.2 EU/DL (ref 0.2–1.0)
pH (UA): 5.5 (ref 5.0–8.0)

## 2009-07-19 LAB — METABOLIC PANEL, COMPREHENSIVE
A-G Ratio: 0.8 — ABNORMAL LOW (ref 1.1–2.2)
ALT (SGPT): 19 U/L (ref 12–78)
AST (SGOT): 20 U/L (ref 15–37)
Albumin: 3.8 g/dL (ref 3.5–5.0)
Alk. phosphatase: 55 U/L (ref 50–136)
Anion gap: 12 mmol/L (ref 5–15)
BUN/Creatinine ratio: 7 — ABNORMAL LOW (ref 12–20)
BUN: 29 MG/DL — ABNORMAL HIGH (ref 6–20)
Bilirubin, total: 0.3 MG/DL (ref 0.2–1.0)
CO2: 19 MMOL/L — ABNORMAL LOW (ref 21–32)
Calcium: 8.7 MG/DL (ref 8.5–10.1)
Chloride: 109 MMOL/L — ABNORMAL HIGH (ref 97–108)
Creatinine: 4.3 MG/DL — ABNORMAL HIGH (ref 0.6–1.3)
GFR est AA: 20 mL/min/{1.73_m2} — ABNORMAL LOW (ref >60)
GFR est non-AA: 16 mL/min/{1.73_m2} — ABNORMAL LOW (ref >60)
Globulin: 4.5 g/dL — ABNORMAL HIGH (ref 2.0–4.0)
Glucose: 87 MG/DL (ref 65–100)
Potassium: 4 MMOL/L (ref 3.5–5.1)
Protein, total: 8.3 g/dL — ABNORMAL HIGH (ref 6.4–8.2)
Sodium: 140 MMOL/L (ref 136–145)

## 2009-07-19 LAB — CBC WITH AUTOMATED DIFF
ABS. BASOPHILS: 0 10*3/uL (ref 0.0–0.1)
ABS. EOSINOPHILS: 0.1 10*3/uL (ref 0.0–0.4)
ABS. LYMPHOCYTES: 1.7 10*3/uL (ref 0.8–3.5)
ABS. MONOCYTES: 0.6 10*3/uL (ref 0.0–1.0)
ABS. NEUTROPHILS: 4 10*3/uL (ref 1.8–8.0)
BASOPHILS: 0 % (ref 0–1)
EOSINOPHILS: 2 % (ref 0–7)
HCT: 34.4 % — ABNORMAL LOW (ref 36.6–50.3)
HGB: 11.6 g/dL — ABNORMAL LOW (ref 12.1–17.0)
LYMPHOCYTES: 26 % (ref 12–49)
MCH: 31.4 PG (ref 26.0–34.0)
MCHC: 33.7 g/dL (ref 30.0–36.5)
MCV: 93 FL (ref 80.0–99.0)
MONOCYTES: 9 % (ref 5–13)
NEUTROPHILS: 63 % (ref 32–75)
PLATELET: 174 10*3/uL (ref 150–400)
RBC: 3.7 M/uL — ABNORMAL LOW (ref 4.10–5.70)
RDW: 13.7 % (ref 11.5–14.5)
WBC: 6.4 10*3/uL (ref 4.1–11.1)

## 2009-07-19 LAB — EKG, 12 LEAD, INITIAL
Atrial Rate: 66 {beats}/min
Calculated P Axis: 61 degrees
Calculated R Axis: 8 degrees
Calculated T Axis: -124 degrees
Diagnosis: NORMAL
P-R Interval: 156 ms
Q-T Interval: 438 ms
QRS Duration: 78 ms
QTC Calculation (Bezet): 459 ms
Ventricular Rate: 66 {beats}/min

## 2009-07-19 LAB — METABOLIC PANEL, BASIC
Anion gap: 11 mmol/L (ref 5–15)
BUN/Creatinine ratio: 7 — ABNORMAL LOW (ref 12–20)
BUN: 30 MG/DL — ABNORMAL HIGH (ref 6–20)
CO2: 23 MMOL/L (ref 21–32)
Calcium: 8.6 MG/DL (ref 8.5–10.1)
Chloride: 107 MMOL/L (ref 97–108)
Creatinine: 4.4 MG/DL — ABNORMAL HIGH (ref 0.6–1.3)
GFR est AA: 19 mL/min/{1.73_m2} — ABNORMAL LOW (ref >60)
GFR est non-AA: 16 mL/min/{1.73_m2} — ABNORMAL LOW (ref >60)
Glucose: 95 MG/DL (ref 65–100)
Potassium: 4 MMOL/L (ref 3.5–5.1)
Sodium: 141 MMOL/L (ref 136–145)

## 2009-07-19 LAB — TROPONIN I
Troponin-I, Qt.: 0.12 ng/mL — ABNORMAL HIGH (ref <0.05)
Troponin-I, Qt.: 0.11 ng/mL — ABNORMAL HIGH (ref <0.05)

## 2009-07-19 LAB — POC TROPONIN-I: Troponin-I (POC): 0.04 ng/mL (ref 0.00–0.08)

## 2009-07-19 LAB — BNP: BNP: 116 pg/mL — ABNORMAL HIGH (ref 0–100)

## 2009-07-19 LAB — PROTHROMBIN TIME + INR
INR: 1 (ref 0.9–1.1)
Prothrombin time: 10.7 SECS (ref 9.0–11.0)

## 2009-07-19 LAB — CK W/ REFLX CKMB: CK: 209 U/L (ref 35–232)

## 2009-07-19 LAB — PTT: aPTT: 29.4 s (ref 24.0–33.0)

## 2009-07-19 LAB — CK: CK: 170 U/L (ref 35–232)

## 2009-07-19 MED ADMIN — lisinopril (PRINIVIL, ZESTRIL) tablet 20 mg: ORAL | @ 05:00:00 | NDC 00185010210

## 2009-07-19 MED ADMIN — SALINE PERIPHERAL FLUSH Q8H 5 mL: @ 19:00:00 | NDC 82903065462

## 2009-07-19 MED ADMIN — amlodipine (NORVASC) tablet 20 mg: ORAL | @ 19:00:00 | NDC 59762153002

## 2009-07-19 MED ADMIN — metoprolol (LOPRESSOR) tablet 50 mg: ORAL | @ 19:00:00 | NDC 51079080101

## 2009-07-19 MED ADMIN — clopidogrel (PLAVIX) tablet 75 mg: ORAL | @ 19:00:00 | NDC 63653117103

## 2009-07-19 MED ADMIN — metoprolol (LOPRESSOR) tablet 50 mg: ORAL | @ 06:00:00 | NDC 72888000501

## 2009-07-19 MED ADMIN — hydrALAZINE (APRESOLINE) tablet 50 mg: ORAL | @ 21:00:00 | NDC 50111032801

## 2009-07-19 MED ADMIN — hydrocodone-acetaminophen (LORTAB) 7.5-500 mg per tablet 1 Tab: ORAL | @ 06:00:00 | NDC 63739014110

## 2009-07-19 MED ADMIN — hydrALAZINE (APRESOLINE) tablet 50 mg: ORAL | @ 19:00:00 | NDC 50111032801

## 2009-07-19 MED ADMIN — hydrALAZINE (APRESOLINE) tablet 50 mg: ORAL | @ 05:00:00 | NDC 50111032801

## 2009-07-19 MED ADMIN — saline peripheral flush 5 mL: @ 02:00:00 | NDC 82903065462

## 2009-07-19 MED ADMIN — SALINE PERIPHERAL FLUSH Q8H 5 mL: @ 03:00:00

## 2009-07-19 MED ADMIN — lisinopril (PRINIVIL, ZESTRIL) tablet 20 mg: ORAL | @ 19:00:00 | NDC 00185010210

## 2009-07-19 MED ADMIN — saline peripheral flush 5 mL: @ 17:00:00

## 2009-07-19 MED ADMIN — labetalol (NORMODYNE;TRANDATE) injection 20 mg: INTRAVENOUS | @ 02:00:00 | NDC 00409226720

## 2009-07-19 MED ADMIN — nitroglycerin (NITROBID) 2 % ointment 1 Inch: TOPICAL | @ 02:00:00 | NDC 00281032608

## 2009-07-19 MED ADMIN — amlodipine (NORVASC) tablet 20 mg: ORAL | @ 05:00:00 | NDC 59762153002

## 2009-07-19 MED ADMIN — SALINE PERIPHERAL FLUSH Q8H 5 mL: @ 13:00:00 | NDC 82903065462

## 2009-07-19 MED ADMIN — hydrocodone-acetaminophen (LORTAB) 7.5-500 mg per tablet 1 Tab: ORAL | @ 11:00:00 | NDC 63739014110

## 2009-07-19 MED FILL — HYDROCODONE-ACETAMINOPHEN 7.5 MG-500 MG TAB: ORAL | Qty: 1

## 2009-07-19 MED FILL — AMLODIPINE 5 MG TAB: 5 mg | ORAL | Qty: 4

## 2009-07-19 MED FILL — BD PRE-FILLED 0.9% SALINE WITH BLUNT PLASTIC CANNULA INJECTION SYRINGE: INTRAMUSCULAR | Qty: 10

## 2009-07-19 MED FILL — LISINOPRIL 20 MG TAB: 20 mg | ORAL | Qty: 1

## 2009-07-19 MED FILL — PLAVIX 75 MG TABLET: 75 mg | ORAL | Qty: 1

## 2009-07-19 MED FILL — NITRO-BID 2 % TRANSDERMAL OINTMENT: 2 % | TRANSDERMAL | Qty: 1

## 2009-07-19 MED FILL — HYDRALAZINE 50 MG TAB: 50 mg | ORAL | Qty: 1

## 2009-07-19 MED FILL — BD POSIFLUSH NORMAL SALINE 0.9 % INJECTION SYRINGE: INTRAMUSCULAR | Qty: 10

## 2009-07-19 MED FILL — LABETALOL 5 MG/ML IV SOLN: 5 mg/mL | INTRAVENOUS | Qty: 20

## 2009-07-19 MED FILL — METOPROLOL TARTRATE 50 MG TAB: 50 mg | ORAL | Qty: 1

## 2009-07-19 NOTE — Progress Notes (Signed)
Medical Progress Note      NAME: [name]   DOB:  1965-08-18  MRM:  [id number]    Date/Time: 07/19/2009           Problem List:     Patient Active Hospital Problem List:  *Accelerated hypertension (07/18/2009)    Chronic kidney disease, stage IV (severe) (07/19/2009)           Subjective:     Right arm pain 2 years ago associated with salt excess and medication non-compliance resolved overnight then with salt restriction and med compliance.    Right arm discomfort rates a 5/10 this am without chest pain,dizziness, or dyspnea. Right arm discomfort is positional.    1-2 months of migratory extremity cramps including right foot last night.      Past Medical History   Diagnosis Date   ??? Chronic kidney disease    ??? Hypertension    ??? Anemia    ??? Coronary artery disease               Objective:         Vitals:      Last 24hrs VS reviewed since prior progress note. Most recent are:    Visit Vitals   Item Reading   ??? BP 149/88   ??? Pulse 79   ??? Temp 97.4 ??F (36.3 ??C)   ??? Resp 18   ??? Ht 6'   ??? Wt 182 lb 8.7 oz   ??? SpO2 100%       SpO2 Readings from Last 6 Encounters:   07/19/2009 100%      O2 Flow Rate (L/min): 2 l/min     Intake/Output Summary (Last 24 hours) at 07/19/09 0737  Last data filed at 07/19/09 0130   Gross per 24 hour   Intake      0 ml   Output    250 ml   Net   -250 ml                    Exam:      General:  Alert, cooperative, no distress, appears stated age.   Lungs:   Clear to auscultation bilaterally.   Heart:  Regular rate and rhythm, S1, S2 normal, no murmur, click, rub or gallop.   Abdomen:   Soft, non-tender. Bowel sounds normal. No masses,  No organomegaly.   Extremities: Extremities normal, atraumatic, no cyanosis or edema. 2+ DP pulses b/l. Minor right antecubital fossa area discomfort with passive bicep flexion.elbow flex/extend rom intact. A/c fossa iv site looks clean.     Lab Data Reviewed: (see below)  Recent Results (from the past 24 hour(s))   CBC WITH AUTOMATED DIFF     Collection Time    07/18/09  8:40 PM   Component Value Range   ??? WBC 6.4  4.1 - 11.1 (K/uL)   ??? RBC 3.70 (*) 4.10 - 5.70 (M/uL)   ??? HGB 11.6 (*) 12.1 - 17.0 (g/dL)   ??? HCT 34.4 (*) 36.6 - 50.3 (%)   ??? MCV 93.0  80.0 - 99.0 (FL)   ??? MCH 31.4  26.0 - 34.0 (PG)   ??? MCHC 33.7  30.0 - 36.5 (g/dL)   ??? RDW 13.7  11.5 - 14.5 (%)   ??? PLATELET 174  150 - 400 (K/uL)   ??? NEUTROPHILS 63  32 - 75 (%)   ??? LYMPHOCYTES 26  12 - 49 (%)   ??? MONOCYTES 9  5 - 13 (%)   ??? EOSINOPHILS 2  0 - 7 (%)   ??? BASOPHILS 0  0 - 1 (%)   ??? ABSOLUTE NEUTS 4.0  1.8 - 8.0 (K/UL)   ??? ABSOLUTE LYMPHS 1.7  0.8 - 3.5 (K/UL)   ??? ABSOLUTE MONOS 0.6  0.0 - 1.0 (K/UL)   ??? ABSOLUTE EOSINS 0.1  0.0 - 0.4 (K/UL)   ??? ABSOLUTE BASOS 0.0  0.0 - 0.1 (K/UL)   CK Desaree Downen/ REFLX CKMB    Collection Time    07/18/09  8:40 PM   Component Value Range   ??? CK 209  35 - 232 (U/L)   TROPONIN I    Collection Time    07/18/09  8:40 PM   Component Value Range   ??? Troponin-I, Qt. 0.12 (*) <0.05 (ng/mL)   METABOLIC PANEL, COMPREHENSIVE    Collection Time    07/18/09  8:40 PM   Component Value Range   ??? Sodium 140  136 - 145 (MMOL/L)   ??? Potassium 4.0  3.5 - 5.1 (MMOL/L)   ??? Chloride 109 (*) 97 - 108 (MMOL/L)   ??? CO2 19 (*) 21 - 32 (MMOL/L)   ??? Anion gap 12  5 - 15 (mmol/L)   ??? Glucose 87  65 - 100 (MG/DL)   ??? BUN 29 (*) 6 - 20 (MG/DL)   ??? Creatinine 4.3 (*) 0.6 - 1.3 (MG/DL)   ??? BUN/Creatinine ratio 7 (*) 12 - 20 ( )   ??? GFR est AA 20 (*) >60 (ml/min/1.19m2)   ??? GFR est non-AA 16 (*) >60 (ml/min/1.27m2)   ??? Calcium 8.7  8.5 - 10.1 (MG/DL)   ??? Bilirubin, total 0.3  0.2 - 1.0 (MG/DL)   ??? ALT 19  12 - 78 (U/L)   ??? AST 20  15 - 37 (U/L)   ??? Alk. phosphatase 55  50 - 136 (U/L)   ??? Protein, total 8.3 (*) 6.4 - 8.2 (g/dL)   ??? Albumin 3.8  3.5 - 5.0 (g/dL)   ??? Globulin 4.5 (*) 2.0 - 4.0 (g/dL)   ??? A-G Ratio 0.8 (*) 1.1 - 2.2 ( )   PTT    Collection Time    07/18/09  8:40 PM   Component Value Range   ??? aPTT 29.4  24.0 - 33.0 (sec)   PROTHROMBIN TIME    Collection Time    07/18/09  8:40 PM    Component Value Range   ??? INR 1.0  0.9 - 1.1 ( )   ??? Prothrombin Time-PT 10.7  9.0 - 11.0 (SECS)   BNP    Collection Time    07/18/09  8:40 PM   Component Value Range   ??? B-NP 116 (*) 0 - 100 (pg/mL)   POC TROPONIN-I    Collection Time    07/18/09  8:41 PM   Component Value Range   ??? Troponin-I (POC) 0.04  0.00 - 0.08 (ng/mL)   URINALYSIS Cleon Thoma/ REFLEX CULTURE    Collection Time    07/19/09  1:30 AM   Component Value Range   ??? Color YELLOW     ??? Appearance CLOUDY     ??? Specific gravity 1.020  1.003 - 1.030 ( )   ??? pH 5.5  5.0 - 8.0 ( )   ??? Protein 30 (*) NEGATIVE (MG/DL)   ??? Glucose NEGATIVE   NEGATIVE (MG/DL)   ??? Ketone NEGATIVE   NEGATIVE (MG/DL)   ??? Bilirubin NEGATIVE  NEGATIVE    ??? Blood NEGATIVE   NEGATIVE    ??? Urobilinogen 0.2  0.2 - 1.0 (EU/DL)   ??? Nitrites NEGATIVE   NEGATIVE    ??? Leukocyte Esterase NEGATIVE   NEGATIVE    ??? UA:UC IF INDICATED CULTURE NOT INDICATED BY UA RESULT     ??? WBC 0-4  0 - 4 (/HPF)   ??? RBC 0-3  0 - 5 (/HPF)   ??? Epithelial cells 0-5  0 - 5 (/LPF)   ??? Bacteria NEGATIVE   NEGATIVE (/HPF)   ??? Hyaline Cast 0-2  0 - 2    METABOLIC PANEL, BASIC    Collection Time    07/19/09  5:30 AM   Component Value Range   ??? Sodium 141  136 - 145 (MMOL/L)   ??? Potassium 4.0  3.5 - 5.1 (MMOL/L)   ??? Chloride 107  97 - 108 (MMOL/L)   ??? CO2 23  21 - 32 (MMOL/L)   ??? Anion gap 11  5 - 15 (mmol/L)   ??? Glucose 95  65 - 100 (MG/DL)   ??? BUN 30 (*) 6 - 20 (MG/DL)   ??? Creatinine 4.4 (*) 0.6 - 1.3 (MG/DL)   ??? BUN/Creatinine ratio 7 (*) 12 - 20 ( )   ??? GFR est AA 19 (*) >60 (ml/min/1.16m2)   ??? GFR est non-AA 16 (*) >60 (ml/min/1.22m2)   ??? Calcium 8.6  8.5 - 10.1 (MG/DL)   TROPONIN I    Collection Time    07/19/09  5:30 AM   Component Value Range   ??? Troponin-I, Qt. 0.11 (*) <0.05 (ng/mL)   CK    Collection Time    07/19/09  5:30 AM   Component Value Range   ??? CK 170  35 - 232 (U/L)         Medications Reviewed: (see below)    ______________________________________________________________________    Medications:      Current facility-administered medications   Medication Dose Route Frequency   ??? SALINE PERIPHERAL FLUSH Q8H 5 mL  5 mL InterCATHeter Q8H   ??? saline peripheral flush 5 mL  5 mL InterCATHeter PRN   ??? aspirin (ASPIRIN) tablet 325 mg  325 mg Oral ONCE   ??? nitroglycerin (NITROBID) 2 % ointment 1 Inch  1 Inch Topical ONCE   ??? labetalol (NORMODYNE;TRANDATE) injection 20 mg  20 mg IntraVENous NOW   ??? amlodipine (NORVASC) tablet 20 mg  20 mg Oral DAILY   ??? clopidogrel (PLAVIX) tablet 75 mg  75 mg Oral DAILY   ??? hydrALAZINE (APRESOLINE) tablet 50 mg  50 mg Oral TID   ??? lisinopril (PRINIVIL, ZESTRIL) tablet 20 mg  20 mg Oral DAILY   ??? metoprolol (LOPRESSOR) tablet 50 mg  50 mg Oral BID   ??? acetaminophen (TYLENOL) tablet 650 mg  650 mg Oral Q4H PRN   ??? hydrocodone-acetaminophen (LORTAB) 7.5-500 mg per tablet 1 Tab  1 Tab Oral Q4H PRN   ??? nitroglycerin (NITROSTAT) tablet 0.4 mg  0.4 mg SubLINGual PRN                     Assessment:   1. Right arm pain with positional component.  2. EKG with NSR , LVH with repolarization changes , no change on today's ekg vs. Yesterday's   3. Accelerated HTN-bp now controlled.  4. Discussed Cohl Behrens/ dr. Hanley Ben who will see.  Problem List Date Reviewed: 07/19/2009      Class  Chronic kidney disease, stage IV (severe) [585.4] (Chronic)         *Accelerated hypertension [401.0J]                    Plan:   Exercise stress cardiolite since he has ruled out for mi. Nephrology to see. If stress cardiolite neg. And all agree, then hopefully home later today.                                                       ___________________________________________________      Attending Physician: Britt Boozer. Doreene Eland, MD

## 2009-07-19 NOTE — Consults (Signed)
Renal Consult Note                                 ASSESSMENT/PLAN:    HTN - much better control back on PO meds (compliance issues)    CKD 4 - secondary to HTN; bland U/A; no acute indication for RRT; last saw Dr. Mora Appl early in 2010 - needs to get back in with her (I gave him a card to call on Monday for a f/u appt with Dr. Mora Appl in next few weeks)    OK for discharge from my standpoint    Anemia - Hgb greater than 10    Patient Active Hospital Problem List:  *Accelerated hypertension (07/18/2009)    Chronic kidney disease, stage IV (severe) (07/19/2009)      Admit Date- 07/18/2009  Consult Date - 07/19/2009    Consult requested by -  Dr. Doreene Eland    Chief Complaint -  CKD 4 with HTN    HPI-  [name] is a 43 y.o. African American male who has a previous medical hx significant for chronic kidney disease (creatinine 5.8 2/09 and GFR of 17 cc/min earlier this year; followed by Dr. Mora Appl in the past), HTN, and anemia who presented to the ED via EMS for chest flutter and arm pain. Pt reports constant right arm pain for 1 hour prior to presentation to ED (began at work), described as a dull ache, rated as 7/10. Pt also reported fluttering in his chest. Pt also reports dizziness and lightheadedness. EMS reports pt was found to have BP of 222/156, given 1 Nitro, brought down to 190/146. Pt is followed by Dr. Brooke Bonito. Pt denies chest pain, SOB, abdominal pain, headache, cough, congestion, and rhinorrhea.    No further pain or fluttery feeling. Some pruritis, no n/v/d, appetite is ok, some weight loss, good UOP.        LAB -  Lab Results   Component Value Date/Time    BUN 30 07/19/2009  5:30 AM    BUN 29 07/18/2009  8:40 PM    BUN 38 09/19/2007  2:20 PM    Creatinine 4.4 07/19/2009  5:30 AM    Creatinine 4.3 07/18/2009  8:40 PM    Creatinine 5.8 09/19/2007  2:20 PM       Recent Labs   Basename 07/18/09 2040   ??? WBC 6.4   ??? HGB 11.6*   ??? HCT 34.4*   ??? PLT 174       Recent Labs    Basename 07/19/09 0530 07/18/09 2040   ??? NA 141 140   ??? K 4.0 4.0   ??? CL 107 109*   ??? CO2 23 19*   ??? BUN 30* 29*   ??? CREA 4.4* 4.3*   ??? GLU 95 87   ??? CA 8.6 8.7   ??? MG -- --   ??? PHOS -- --   ??? URICA -- --       Recent Labs   Basename 07/18/09 2040   ??? SGOT 20   ??? GPT 19   ??? AP 55   ??? TBIL 0.3   ??? TP 8.3*   ??? ALB 3.8   ??? GLOB 4.5*   ??? GGT --         No results found for this basename: PH:2,PCO2:2,PO2:2, in the last 72 hours    U/A - bland     Chest X-Ray  -  no infiltrate      Physical Exam-    Filed Vitals:    07/19/2009 12:54 AM 07/19/2009 12:55 AM 07/19/2009  5:25 AM 07/19/2009  8:34 AM   BP: 169/92  149/88 141/79   Pulse:   79 76   Temp:   97.4 ??F (36.3 ??C) 96.4 ??F (35.8 ??C)   Resp:   18 18   Height:       Weight:  183 lb 3.2 oz (83.1 kg) 182 lb 8.7 oz (82.8 kg)    SpO2:   100% 100%         Intake/Output Summary (Last 24 hours) at 07/19/09 1121  Last data filed at 07/19/09 0130   Gross per 24 hour   Intake      0 ml   Output    250 ml   Net   -250 ml       Wt Readings from Last 3 Encounters:   07/19/2009 182 lb 8.7 oz (82.8 kg)         Physical Examination: General appearance - alert, well appearing, and in no distress and oriented to person, place, and time  Mental status - normal mood, behavior, speech, dress, motor activity, and thought processes  Eyes - pupils equal and reactive, extraocular eye movements intact  Lymphatics - no palpable lymphadenopathy, no hepatosplenomegaly  Chest - clear to auscultation, no wheezes, rales or rhonchi, symmetric air entry  Heart - normal rate, regular rhythm, normal S1, S2, no murmurs, rubs, clicks or gallops  Abdomen - soft, nontender, nondistended, no masses or organomegaly  bowel sounds normal  Neurological - cranial nerves II through XII intact  Musculoskeletal - no joint tenderness, deformity or swelling  Extremities - peripheral pulses normal, no pedal edema, no clubbing or cyanosis  Skin - normal coloration and turgor, no rashes, no suspicious skin lesions noted      Past Medical History   Diagnosis Date   ??? Chronic kidney disease    ??? Hypertension    ??? Anemia    ??? Coronary artery disease         Past Surgical History   Procedure Date   ??? Pr acne surgery of skin abscess    ??? Hx orthopaedic      right miniscus repair        Patient's Medications   Start Taking    No medications on file   Continue Taking    AMLODIPINE BESYLATE (NORVASC PO)    Take 20 mg by mouth daily. Indications: HYPERTENSION    CLOPIDOGREL (PLAVIX) 75 MG TABLET    Take 75 mg by mouth daily. Indications: UNSTABLE ANGINA PECTORIS    HYDRALAZINE (APRESOLINE) 50 MG TABLET    Take 50 mg by mouth three (3) times daily. Indications: HYPERTENSION    LISINOPRIL (PRINIVIL, ZESTRIL) 20 MG TABLET    Take  by mouth daily. Indications: HYPERTENSION    METOPROLOL (LOPRESSOR) 25 MG TABLET    Take 50 mg by mouth two (2) times a day. Indications: HYPERTENSION   These Medications have changed    No medications on file   Stop Taking    No medications on file        Current facility-administered medications   Medication Dose Route Frequency   ??? SALINE PERIPHERAL FLUSH Q8H 5 mL  5 mL InterCATHeter Q8H   ??? saline peripheral flush 5 mL  5 mL InterCATHeter PRN   ??? aspirin (ASPIRIN) tablet 325 mg  325 mg Oral ONCE   ???  nitroglycerin (NITROBID) 2 % ointment 1 Inch  1 Inch Topical ONCE   ??? labetalol (NORMODYNE;TRANDATE) injection 20 mg  20 mg IntraVENous NOW   ??? amlodipine (NORVASC) tablet 20 mg  20 mg Oral DAILY   ??? clopidogrel (PLAVIX) tablet 75 mg  75 mg Oral DAILY   ??? hydrALAZINE (APRESOLINE) tablet 50 mg  50 mg Oral TID   ??? lisinopril (PRINIVIL, ZESTRIL) tablet 20 mg  20 mg Oral DAILY   ??? metoprolol (LOPRESSOR) tablet 50 mg  50 mg Oral BID   ??? acetaminophen (TYLENOL) tablet 650 mg  650 mg Oral Q4H PRN   ??? hydrocodone-acetaminophen (LORTAB) 7.5-500 mg per tablet 1 Tab  1 Tab Oral Q4H PRN   ??? nitroglycerin (NITROSTAT) tablet 0.4 mg  0.4 mg SubLINGual PRN       No Known Allergies   History   Substance Use Topics    ??? Smoking status: Current Everyday Smoker -- 0.5 packs/day for 15 years   ??? Smokeless tobacco: Never Used   ??? Alcohol Use: 6.0 oz/week     12 Cans of beer per week        No family history on file.  No family history of renal disease.     ROS-   Negative except right arm pain, chest fluttering (gone now), weight loss, pruritis.     Thank you for the consult.    Andrez Grime, MD

## 2009-07-19 NOTE — Progress Notes (Signed)
Bedside and Verbal shift change report given to oncoming nurse.  Frances RN

## 2009-07-19 NOTE — Progress Notes (Signed)
Pt stated he was having R arm pain and not chest pain therefore wanting the Hydrocodone instead of the nitroglycerin. Rosezetta Schlatter, RN

## 2009-07-19 NOTE — Progress Notes (Signed)
Reviewed D/C instructions with Pt and family, no questions were asked, pt d/c by wheelchair with belongings.  Chelsea Aus, RN

## 2009-07-19 NOTE — Consults (Signed)
Name: ANGELA, PLATNER Admitted: 07/18/2009  MR #: [id number] DOB: 1965/11/16  Account #: [id number] Age: 43  Consultant: Ninfa Meeker, M.D. Location: [id number]     CONSULTATION REPORT    DATE OF CONSULTATION:      PROBLEM: Arm pain.    HISTORY OF PRESENT ILLNESS: The patient is a 43 year old, married, black  male with longstanding hypertension and chronic renal failure stage 4. He  presents with arm aching and some breathlessness and accelerated  hypertension. The patient is a third of a pack a day cigarette smoker, and  apparently 2 years ago when hospitalized, Dr. Brooke Bonito saw him and nuclear  stress testing was suggestive of anterior ischemia with a creatinine of  about 7 at the time. It was felt that catheterization would tip him over to  dialysis and medical management of possible coronary disease was  entertained, and has continued. The patient apparently has taken various  medicines sporadically and had been doing okay until the last several days  when he developed recurrent aching in his arm and increasing dyspnea.    PAST HISTORY: Remarkable for right knee arthroscopy, remote appendectomy.  He also had some sort of trauma to his chest playing football in his youth  and wound up needing some surgical removal of a mass, presumably benign.    MEDICATIONS: Recent medications have included:  1. Amlodipine 20 mg a day.  2. Plavix 75 mg a day.  3. Hydralazine 50 mg 3 times a day.  4. Lisinopril 20 mg day.  5. Metoprolol 50 mg twice a day. Some question about compliance.    ALLERGIES: NONE.    FAMILY HISTORY: Mother is diabetic but alive. Family history of cancer,  not much of heart disease.    SOCIAL HISTORY: The patient was in the [employer]. He is married. He has 3  children, ages 2 to 17. 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 Not a significant drinker. A third of a pack of cigarettes a day.    REVIEW OF SYSTEMS: Other than the current arm pain and breathlessness,   review of systems is negative.    PHYSICAL EXAMINATION  VITAL SIGNS: Exam reveals blood pressure 150/80, heart rate of 76,  respiratory rate of 18.  GENERAL: The patient is a well-developed, very pleasant, cooperative black  male.  HEENT: Unremarkable.  NECK: Supple. No bruits.  THORAX: Symmetrical, mobile.  LUNGS: Clear.  HEART: Crisp heart sounds.  ABDOMEN: Soft, active bowel sounds.  EXTREMITIES: Normal pulses. Normal musculature. No edema.  NEUROLOGICAL: Nonfocal.    IMPRESSION  1. Arm pain and breathlessness. Rule out active ischemia.  2. Organic heart disease. Etiology: Hypertension, possibly atherosclerosis.  Anatomy: Left ventricular hypertrophy, possible coronary obstruction.  Physiology: Possible angina.  3. Hypertension.  4. Chronic renal failure.    RECOMMENDATIONS: Blood pressure seems to be controlled taking drugs.  Nuclear stress testing would seem reasonable, looking to see if there is  any significant active ischemic defect. If so, may need to consider  angiography which would obviously require dye. If again trivial hint of  ischemia or none, continue medical management. We will check later after  stress testing has been done. Thank you for allowing me to see him.        Reviewed on 07/19/2009 11:21 AM            Ninfa Meeker, M.D.    cc: Ninfa Meeker, M.D.   Britt Boozer. Doreene Eland, M.D.  Trudy L. Mora Appl, M.D.        GHD/wmx; D: 07/19/2009 9:18 A; T: 07/19/2009 10:16 A; DOC# [id number]; Job#  [id number]

## 2009-07-19 NOTE — Progress Notes (Signed)
IM . STress Myoview negative. He is anxious for discharge and stable for discharge. Instructions are reviewed with patient and family(present). Discharge summary dictated.

## 2009-07-19 NOTE — Consults (Signed)
Consult dictated [phone]    Imp) Right arm pain/dyspnea  Possibly angina           Hypertension with HCVD with LVH           Chronic kidney disease stage IV    Rec) Nuclear stress test with further suggestions pending results                                                                                                        Thanks

## 2009-07-19 NOTE — Procedures (Signed)
Procedures signed by Jerrol Banana, MD at 07/24/09 [phone]                 Author: Jerrol Banana, MD  Service: --  Author Type: Physician       Filed: 07/24/09 0828  Date of Service: 07/23/09 1120  Status: Signed          Editor: Jerrol Banana, MD (Physician)            Procedure Orders        1. STRESS TEST MYOVIEW [[id number]] ordered by  at 07/23/09 1120                         <!--EPICS-->                       [facility]<BR>                               [address]<BR>                              [place], [place] [address]<BR> <BR> <BR> Name:      [name], [name]         Service Date:    07/19/2009<BR> DOB:       Apr 01, 1966                   Ordered by:      Ninfa Meeker, MD<BR> MR #:      [id number]                    Location:        [id number]  [id number]<BR> Sex:       M                            Age:             43<BR> Billing #: [id number]                 Date of Adm:     07/18/2009<BR> <BR> <BR>                             MYOVIEW STRESS TEST<BR> <BR> <BR> <BR> MEDICATIONS:<BR> <BR> Baseline  Tracing Supine:<BR> Standing BP:<BR> Sitting  BP:  144/100<BR> <BR> Smoke:   ppd.<BR> Hypertensive: Yes.<BR> Diabetic: No.<BR> Cholesterol:<BR> Resting EKG: Left ventricular hypertrophy.<BR> Resting BP: 144/100 mmHg<BR> Resting Heart Rate: 74 bpm<BR>  <BR> Baseline Tracing Standing - Limb leads Attached To Trunk:<BR> <BR>              STAGE   mph   %GRADE   MIN   HEART      BP      ST SEGMENT<BR>                                            RATE              SYMPTOM(S)<BR> Preliminary  80    144/100<BR> Progressive    I     1.7     10      3      123    160/90<BR> Multistage    II     2.5     12      3      136    160/80<BR> Continuous    III    3.4     14     2:01   155    170/90<BR> Exercise      IV<BR> <BR> Standing  Post Exercise<BR> Sitting Post Exercise                               185/96<BR> <BR> 86     % of predicted max heart rate (Predicted max  179/min) 152=85%<BR>    - &quot;Double Product&quot; (max systolic pressure x max heart rate)<BR>       METS reached  at maximum effort<BR> <BR> REPORT:<BR> SUMMARY:<BR>    1. Resting electrocardiogram, left ventricular hypertrophy.<BR>    2. Normal functional capacity.<BR>    3. Appropriate heart rate response to exercise.<BR>    4. BP response to exercise, resting  hypertension-blunted response.<BR>    5. No chest pain.<BR>    6. No arrhythmias.<BR>    7. ST changes, not meeting criteria for ischemia.<BR> <BR> IMPRESSION:<BR>    1. Non-diagnostic stress electrocardiogram due to resting abnormality.<BR>    2. Nuclear  images and results to be reported separately.<BR> <BR> <BR> <BR> <BR> <BR> <BR> <BR> ___________________________<BR> Otis Brace. Oscar La, MD<BR> <BR> <BR> cc:   Otis Brace. Oscar La, MD<BR>       Ninfa Meeker, M.D.<BR> <BR> <BR> MDC/cb;  D: 07/19/2009 12:00  P; T:  07/23/2009 11:20 A; DOC# [id number];<BR> [id number]<BR> <!--EPICE-->

## 2009-07-20 MED ORDER — HYDROCODONE-ACETAMINOPHEN 7.5 MG-500 MG TAB
ORAL_TABLET | ORAL | Status: DC | PRN
Start: 2009-07-20 — End: 2010-05-22

## 2009-07-20 MED ORDER — LISINOPRIL 20 MG TAB
20 mg | ORAL_TABLET | Freq: Every day | ORAL | Status: DC
Start: 2009-07-20 — End: 2010-05-22

## 2009-07-20 MED ORDER — ACETAMINOPHEN 325 MG TABLET
325 mg | ORAL_TABLET | ORAL | Status: AC | PRN
Start: 2009-07-20 — End: 2010-07-14

## 2009-07-20 MED ORDER — AMLODIPINE 10 MG TAB
10 mg | ORAL_TABLET | Freq: Every day | ORAL | Status: AC
Start: 2009-07-20 — End: 2010-07-14

## 2009-07-20 NOTE — Discharge Summary (Signed)
Name: [name], [name] Admitted: 07/18/2009  MR #: [id number] Discharged: 07/19/2009  Account #: [id number] DOB: [birth year]  Physician: Britt Boozer. Doreene Eland, M.D. Age 43     DISCHARGE SUMMARY      DIAGNOSES  1. Right arm pain.  2. Accelerated hypertension.  3. 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  4. Hypertension.  5. History of anemia.    DISCHARGE MEDICATIONS  1. Tylenol 650 mg p.o. every 4 hours p.r.n. mild right arm pain.  2. Lortab 7.5/500 one p.o. every 4 hours p.r.n. moderate pain.  3. Amlodipine 20 mg daily.  4. Lisinopril 20 mg daily.  5. Hydralazine 50 mg t.i.d.  6. Lopressor 50 mg b.i.d.  7. Plavix 75 mg daily.  8. Nitroglycerin 0.4 mg sublingual q. 5 minutes p.r.n. arm or chest pain.  If no relief after 2 tablets call MD.    DIET: Low-salt diet.    INSTRUCTIONS: No work 12/24 or 12/25. Return to work 12/28. Call Dr.  Doreene Eland for persisting chest or arm pain. Follow up with Dr. Doreene Eland in 1  week and Dr. Hanley Ben in 2 months.    CHIEF COMPLAINT: A 43 year old African American male known with right arm  tightness and very elevated blood pressure. He has not been taking his  medications appropriately and has been eating a high-salt diet. He reported  feeling right arm discomfort at work and had some associated dizziness. It  is unclear whether he had chest pain.    PAST MEDICAL HISTORY: Hypertensive nephrosclerosis with chronic kidney  disease, stage 4, followed by [name], hypertension, anemia,  hospitalization for accelerated hypertension in 2008 with associated vision  disturbance, possible CVA. He had some thrombocytopenia and a stress test  during that hospitalization suggested anterior wall or septal ischemia.  Other past medical history secondary hyperparathyroidism.    PAST SURGICAL HISTORY: Appendectomy, right knee meniscus surgery, chest  wall mass excision at age 24.    MEDICATIONS  1. Amlodipine 20 mg daily.  2. Lisinopril 20 mg daily.   3. Hydralazine 50 mg t.i.d.  4. Metoprolol 50 mg b.i.d.  5. Plavix 75 mg daily.  6. Nitroglycerin p.r.n.    ALLERGIES: NO KNOWN DRUG ALLERGIES.    FAMILY HISTORY: Noncontributory.    SOCIAL HISTORY: Alcohol: 3 cans of beer per week.    Smoking: 4 cigarettes a day.    REVIEW OF SYSTEMS: A comprehensive review of systems is negative, except  for that written in the History of Present Illness.    PHYSICAL EXAMINATION: Blood pressure 187/120, pulse 74, respiration 16,  SpO2 of 100%.  GENERAL APPEARANCE: Alert, cooperative, in no distress, appears stated  age.  NECK: Supple and symmetric. Trachea midline, no adenopathy. Thyroid not  enlarged, symmetric. No tenderness, mass, nodules. No carotid bruits. No  JVD.  LUNGS: Clear to auscultation bilaterally.  HEART: Regular rate and rhythm, S1, S2 normal. No murmur, click, rub, or  gallop.  ABDOMEN: Soft, nontender. Bowel sounds normal. No masses or organomegaly.  EXTREMITIES: Normal, atraumatic. No cyanosis or edema.  NEUROLOGIC: Grossly normal.    DIAGNOSTIC STUDIES: EKG: Normal sinus rhythm, LVH with strain pattern.    White cells 6.4, hemoglobin 11.6, platelet count 174. Troponin-I of 0.12,  CPK 209. BUN 29, creatinine 4.3, chloride 109, CO2 of 19, calcium 8.7,  total bilirubin 0.3, total protein 8.3, albumin 3.8, globulin 4.5. Pro time  10.7, INR 1.0, PTT 29.4. Brain natriuretic peptide 116. Followup troponin-I  was 0.04.  HOSPITAL COURSE: The patient was admitted by Dr. Mayford Knife on call for me,  placed back on his home medications, admitted to Telemetry, ruled out for  MI by serial EKGs and cardiac enzymes. Dr. Deeann Saint (Nephrology) and Dr.  Hanley Ben (Cardiology) saw the patient and participated in his care. It  was noted that there was at least a component of positional right arm  discomfort. A stress Myoview showed no ischemia, demonstrated no infarct  visible, LVEF 52%. He was feeling improved and was anxious for discharge.   On the evening of 12/23 he was discharged home in improved condition and  would have followup as above. He was to keep his appointment with Dr.  Mora Richardson.    Note in the past the patient had seen Dr. Alvira Philips, who does not  have privileges at [facility].                  Britt Boozer. Doreene Eland, M.D.    cc: [name], M.D.   Britt Boozer. Doreene Eland, M.D.   [name], M.D.        WCH/wmx; D: 07/19/2009 7:43 P; T: 07/20/2009 6:28 A; DOC# [id number]; Job#  [id number]

## 2009-07-23 LAB — STRESS TEST MYOVIEW
Functional capacity: NORMAL
Max. Diastolic BP: 90 mmHg
Max. Heart rate: 157 {beats}/min
Max. Systolic BP: 170 mmHg
Peak Ex METs: 6.3 METS

## 2009-07-23 NOTE — Procedures (Signed)
[facility]ondra [address] [facility]   [address]   [place], [place] [address]      Name: [name], SUR Service Date: 07/19/2009  DOB: 1966/05/02 Ordered by: Ninfa Meeker, MD  MR #: [id number] Location: [id number]  Sex: M Age: 43  Billing #: [id number] Date of Adm: 07/18/2009       MYOVIEW STRESS TEST        MEDICATIONS:    Baseline Tracing Supine:  Standing BP:  Sitting BP: 144/100    Smoke: ppd.  Hypertensive: Yes.  Diabetic: No.  Cholesterol:  Resting EKG: Left ventricular hypertrophy.  Resting BP: 144/100 mmHg  Resting Heart Rate: 74 bpm    Baseline Tracing Standing - Limb leads Attached To Trunk:     STAGE mph %GRADE MIN HEART BP ST SEGMENT   RATE SYMPTOM(S)  Preliminary 80 144/100  Progressive I 1.7 10 3  123 160/90  Multistage II 2.5 12 3  136 160/80  Continuous III 3.4 14 2:01 155 170/90  Exercise IV    Standing Post Exercise  Sitting Post Exercise 185/96    86 % of predicted max heart rate (Predicted max 179/min) 152=85%   - "Double Product" (max systolic pressure x max heart rate)   METS reached at maximum effort    REPORT:  SUMMARY:   1. Resting electrocardiogram, left ventricular hypertrophy.   2. Normal functional capacity.   3. Appropriate heart rate response to exercise.   4. BP response to exercise, resting hypertension-blunted response.   5. No chest pain.   6. No arrhythmias.   7. ST changes, not meeting criteria for ischemia.    IMPRESSION:   1. Non-diagnostic stress electrocardiogram due to resting abnormality.   2. Nuclear images and results to be reported separately.                ___________________________  Otis Brace Oscar La, MD      cc: Otis Brace. Oscar La, MD   Ninfa Meeker, M.D.      MDC/cb; D: 07/19/2009 12:00 P; T: 07/23/2009 11:20 A; DOC# [id number];  [id number]

## 2009-07-25 LAB — EKG, 12 LEAD, INITIAL
Atrial Rate: 74 {beats}/min
Calculated P Axis: 70 degrees
Calculated R Axis: 9 degrees
Calculated T Axis: -88 degrees
Diagnosis: NORMAL
P-R Interval: 168 ms
Q-T Interval: 426 ms
QRS Duration: 88 ms
QTC Calculation (Bezet): 472 ms
Ventricular Rate: 74 {beats}/min

## 2010-05-22 MED ORDER — IBUPROFEN 600 MG TAB
600 mg | ORAL_TABLET | Freq: Four times a day (QID) | ORAL | Status: DC | PRN
Start: 2010-05-22 — End: 2011-06-09

## 2010-05-22 MED ORDER — OXYCODONE-ACETAMINOPHEN 5 MG-325 MG TAB
5-325 mg | ORAL | Status: AC
Start: 2010-05-22 — End: 2010-05-22
  Administered 2010-05-22: 16:00:00 via ORAL

## 2010-05-22 MED ORDER — DIAZEPAM 5 MG TAB
5 mg | ORAL_TABLET | Freq: Three times a day (TID) | ORAL | Status: DC | PRN
Start: 2010-05-22 — End: 2011-06-09

## 2010-05-22 MED ORDER — OXYCODONE-ACETAMINOPHEN 5 MG-325 MG TAB
5-325 mg | ORAL_TABLET | ORAL | Status: AC | PRN
Start: 2010-05-22 — End: 2010-05-29

## 2010-05-22 MED ORDER — DIAZEPAM 5 MG TAB
5 mg | ORAL | Status: AC
Start: 2010-05-22 — End: 2010-05-22
  Administered 2010-05-22: 16:00:00 via ORAL

## 2010-05-22 NOTE — ED Provider Notes (Signed)
Patient is a 44 y.o. male presenting with neck pain.   Neck Pain   Pertinent negatives include no headaches.    Pt. States that he awoke with neck and low back pain on Saturday. Denies any blunt trauma or injury. Denies any heavy pushing, pulling or lifting. Denies fever, abdominal pain or urinary symptoms. Pain increases with bending, lifting and position changes. Gait is slow but steady; reflexes are normal. He has been taking motrin without relief.       Past Medical History   Diagnosis Date   ??? Chronic kidney disease    ??? Hypertension    ??? Anemia    ??? Coronary artery disease           Past Surgical History   Procedure Date   ??? Pr acne surgery of skin abscess    ??? Hx orthopaedic      right miniscus repair           No family history on file.     History   Social History   ??? Marital Status: Married     Spouse Name: N/A     Number of Children: N/A   ??? Years of Education: N/A   Occupational History   ??? Not on file.   Social History Main Topics   ??? Smoking status: Current Everyday Smoker -- 0.5 packs/day for 15 years   ??? Smokeless tobacco: Never Used   ??? Alcohol Use: 6.0 oz/week     12 Cans of beer per week   ??? Drug Use: Yes     Special: Marijuana   ??? Sexually Active:    Other Topics Concern   ??? Not on file   Social History Narrative   ??? No narrative on file                    ALLERGIES: Review of patient's allergies indicates no known allergies.      Review of Systems   Constitutional: Negative for activity change and appetite change.   HENT: Positive for neck pain. Negative for sore throat, facial swelling and trouble swallowing.    Eyes: Negative.    Respiratory: Negative for shortness of breath.    Cardiovascular: Negative.    Gastrointestinal: Negative for vomiting, abdominal pain and diarrhea.   Genitourinary: Negative for dysuria.   Musculoskeletal: Positive for myalgias and back pain.   Skin: Negative for color change.   Neurological: Negative for headaches.   Psychiatric/Behavioral: Negative.         Filed Vitals:    05/22/10 1040   BP: 183/124   Pulse: 72   Temp: 96.7 ??F (35.9 ??C)   Resp: 16   Height: 5\' 11"  (1.803 m)   Weight: 189 lb (85.73 kg)   SpO2: 71%              Physical Exam   Nursing note and vitals reviewed.  Constitutional: He is oriented to person, place, and time. He appears well-nourished.        Black male; non smoker   HENT:   Head: Normocephalic.   Eyes: Pupils are equal, round, and reactive to light.   Neck: Neck supple.        Right sided neck pain with active ROM of the head/neck   Cardiovascular: Normal rate and regular rhythm.    Pulmonary/Chest: Effort normal and breath sounds normal.   Abdominal: Soft. Bowel sounds are normal.   Musculoskeletal: He exhibits tenderness.  Low back pain with bending, lifting, extending and position changes; normal reflexes; normal gait; denies any saddle anesthesia   Neurological: He is alert and oriented to person, place, and time.   Skin: Skin is warm and dry.   Psychiatric: He has a normal mood and affect.        MDM    Procedures  Pt. Has been re-examined and is resting comfortably. 1:18 PM  Patient's results have been reviewed with the pt and his wife.  Patient and/or family have verbally conveyed their understanding and agreement of the patient's signs, symptoms, diagnosis, treatment and prognosis and additionally agree to follow up as recommended or return to the Emergency Room should his condition change prior to follow-up.  Discharge instructions have also been provided to the patient with some educational information regarding his diagnosis as well a list of reasons why he would want to return to the ER prior to his follow-up appointment should his condition change.Yetta Numbers, NP

## 2010-05-22 NOTE — Telephone Encounter (Signed)
[name] Needs Appointment for  Today if possible for sharp pain in neck going down his back . Please call. Ph # is [phone].

## 2010-05-22 NOTE — ED Notes (Signed)
Assumed care of pt at this time

## 2010-05-22 NOTE — ED Notes (Signed)
Pt reports neck pain and mid lower back pain since Sunday.

## 2010-05-27 NOTE — Telephone Encounter (Signed)
[name] [name] Needs Appointment for  Pain in his back; was seen  in [facility] ER last Wednesday . Please call. Ph # is [phone].

## 2010-05-27 NOTE — Patient Instructions (Signed)
Heat, valium, and oxycodone as needed for back and neck pain. See Dr. Jenean Lindau  This week. [phone]. No work till 06/03/10 unless Dr. Aris Everts says differently. Avoid lifting more than 10 pounds.

## 2010-05-28 NOTE — ED Provider Notes (Signed)
I was personally available for consultation in the emergency department.  I have reviewed the chart and agree with the documentation recorded by the MLP, including the assessment, treatment plan, and disposition.  Brallan Denio C Aarna Mihalko, MD

## 2010-05-29 NOTE — Progress Notes (Signed)
[name] is a 44 y.o. male     SUBJECTIVE:    Patient  C/o low back pain in the remote past. Recent cervical and lumbar pain [name]/ radiation from  Low back to buttocks., evaluated in the ER.  Past Medical History   Diagnosis Date   ??? Chronic kidney disease    ??? Hypertension    ??? Anemia    ??? Coronary artery disease    ??? Low back pain        Current outpatient prescriptions   Medication Sig Dispense Refill   ??? ibuprofen (MOTRIN) 600 mg tablet Take 1 Tab by mouth every six (6) hours as needed for Pain.  20 Tab  0   ??? oxycodone-acetaminophen (PERCOCET) 5-325 mg per tablet Take 1 Tab by mouth every four (4) hours as needed for Pain for 7 days.  20 Tab  0   ??? diazepam (VALIUM) 5 mg tablet Take 1 Tab by mouth every eight (8) hours as needed.  20 Tab  0   ??? amlodipine (NORVASC) 10 mg tablet Take 2 Tabs by mouth daily for 360 days.  60 Tab  11   ??? acetaminophen (TYLENOL) 325 mg tablet Take 2 Tabs by mouth every four (4) hours as needed for 360 days.  25 Tab  prn   ??? lisinopril (PRINIVIL, ZESTRIL) 20 mg tablet Take  by mouth daily. Indications: HYPERTENSION       ??? hydrALAZINE (APRESOLINE) 50 mg tablet Take 50 mg by mouth three (3) times daily. Indications: HYPERTENSION       ??? metoprolol (LOPRESSOR) 25 mg tablet Take 50 mg by mouth two (2) times a day. Indications: HYPERTENSION       ??? clopidogrel (PLAVIX) 75 mg tablet Take 75 mg by mouth daily. Indications: UNSTABLE ANGINA PECTORIS           Not on File  .History   Smoking status   ??? Current Everyday Smoker -- 0.5 packs/day for 15 years   Smokeless tobacco   ??? Never Used         Review of Systems  Review of Systems - negative except as per HPI      Physical Examination:  BP 160/112   Wt 198 lb 8 oz (90.039 kg)  Physical Exam    General: Appears in mild  Discomfort  Neck: generally good rom.  Lung-clear  Heart - reg  Abdomen -soft, nt  Extremities -minor low back discomfort Hernandez Losasso/ PROM right hip.     [name] Results for orders placed during the hospital encounter of 07/18/09    CBC WITH AUTOMATED DIFF   Component Value Range   ??? WBC 6.4  4.1 - 11.1 (K/uL)   ??? RBC 3.70 (*) 4.10 - 5.70 (M/uL)   ??? HGB 11.6 (*) 12.1 - 17.0 (g/dL)   ??? HCT 34.4 (*) 36.6 - 50.3 (%)   ??? MCV 93.0  80.0 - 99.0 (FL)   ??? MCH 31.4  26.0 - 34.0 (PG)   ??? MCHC 33.7  30.0 - 36.5 (g/dL)   ??? RDW 13.7  11.5 - 14.5 (%)   ??? PLATELET 174  150 - 400 (K/uL)   ??? NEUTROPHILS 63  32 - 75 (%)   ??? LYMPHOCYTES 26  12 - 49 (%)   ??? MONOCYTES 9  5 - 13 (%)   ??? EOSINOPHILS 2  0 - 7 (%)   ??? BASOPHILS 0  0 - 1 (%)   ??? ABSOLUTE NEUTS  4.0  1.8 - 8.0 (K/UL)   ??? ABSOLUTE LYMPHS 1.7  0.8 - 3.5 (K/UL)   ??? ABSOLUTE MONOS 0.6  0.0 - 1.0 (K/UL)   ??? ABSOLUTE EOSINS 0.1  0.0 - 0.4 (K/UL)   ??? ABSOLUTE BASOS 0.0  0.0 - 0.1 (K/UL)   CK Giah Fickett/ REFLX CKMB   Component Value Range   ??? CK 209  35 - 232 (U/L)   TROPONIN I   Component Value Range   ??? Troponin-I, Qt. 0.12 (*) <0.05 (ng/mL)   METABOLIC PANEL, COMPREHENSIVE   Component Value Range   ??? Sodium 140  136 - 145 (MMOL/L)   ??? Potassium 4.0  3.5 - 5.1 (MMOL/L)   ??? Chloride 109 (*) 97 - 108 (MMOL/L)   ??? CO2 19 (*) 21 - 32 (MMOL/L)   ??? Anion gap 12  5 - 15 (mmol/L)   ??? Glucose 87  65 - 100 (MG/DL)   ??? BUN 29 (*) 6 - 20 (MG/DL)   ??? Creatinine 4.3 (*) 0.6 - 1.3 (MG/DL)   ??? BUN/Creatinine ratio 7 (*) 12 - 20 ( )   ??? GFR est AA 20 (*) >60 (ml/min/1.20m2)   ??? GFR est non-AA 16 (*) >60 (ml/min/1.73m2)   ??? Calcium 8.7  8.5 - 10.1 (MG/DL)   ??? Bilirubin, total 0.3  0.2 - 1.0 (MG/DL)   ??? ALT 19  12 - 78 (U/L)   ??? AST 20  15 - 37 (U/L)   ??? Alk. phosphatase 55  50 - 136 (U/L)   ??? Protein, total 8.3 (*) 6.4 - 8.2 (g/dL)   ??? Albumin 3.8  3.5 - 5.0 (g/dL)   ??? Globulin 4.5 (*) 2.0 - 4.0 (g/dL)   ??? A-G Ratio 0.8 (*) 1.1 - 2.2 ( )   PTT   Component Value Range   ??? aPTT 29.4  24.0 - 33.0 (sec)   PROTHROMBIN TIME   Component Value Range   ??? INR 1.0  0.9 - 1.1 ( )   ??? Prothrombin Time-PT 10.7  9.0 - 11.0 (SECS)   EKG, 12 LEAD, INITIAL   Component Value Range   ??? Ventricular Rate 66  (BPM)   ??? Atrial Rate 66  (BPM)    ??? P-R Interval 156  (ms)   ??? QRS Duration 78  (ms)   ??? Q-T Interval 438  (ms)   ??? QTC Calculation (Bezet) 459  (ms)   ??? Calculated P Axis 61  (degrees)   ??? Calculated R Axis 8  (degrees)   ??? Calculated T Axis -124  (degrees)   ??? Diagnosis        Value: Normal sinus rhythm      Left ventricular hypertrophy with repolarization abnormality      No previous ECGs available      Confirmed by Gasper Lloyd, M.D., [place] ([id number]) on 07/19/2009 9:20:15 AM   BNP   Component Value Range   ??? B-NP 116 (*) 0 - 100 (pg/mL)   POC TROPONIN-I   Component Value Range   ??? Troponin-I (POC) 0.04  0.00 - 0.08 (ng/mL)   METABOLIC PANEL, BASIC   Component Value Range   ??? Sodium 141  136 - 145 (MMOL/L)   ??? Potassium 4.0  3.5 - 5.1 (MMOL/L)   ??? Chloride 107  97 - 108 (MMOL/L)   ??? CO2 23  21 - 32 (MMOL/L)   ??? Anion gap 11  5 - 15 (mmol/L)   ??? Glucose 95  65 -  100 (MG/DL)   ??? BUN 30 (*) 6 - 20 (MG/DL)   ??? Creatinine 4.4 (*) 0.6 - 1.3 (MG/DL)   ??? BUN/Creatinine ratio 7 (*) 12 - 20 ( )   ??? GFR est AA 19 (*) >60 (ml/min/1.73m2)   ??? GFR est non-AA 16 (*) >60 (ml/min/1.39m2)   ??? Calcium 8.6  8.5 - 10.1 (MG/DL)   TROPONIN I   Component Value Range   ??? Troponin-I, Qt. 0.11 (*) <0.05 (ng/mL)   CK   Component Value Range   ??? CK 170  35 - 232 (U/L)   EKG, 12 LEAD, INITIAL   Component Value Range   ??? Ventricular Rate 74  (BPM)   ??? Atrial Rate 74  (BPM)   ??? P-R Interval 168  (ms)   ??? QRS Duration 88  (ms)   ??? Q-T Interval 426  (ms)   ??? QTC Calculation (Bezet) 472  (ms)   ??? Calculated P Axis 70  (degrees)   ??? Calculated R Axis 9  (degrees)   ??? Calculated T Axis -88  (degrees)   ??? Diagnosis        Value: Normal sinus rhythm      Voltage criteria for left ventricular hypertrophy      T wave abnormality, consider inferolateral ischemia      Prolonged QT      Confirmed by Arnoldo Lenis, M.D., [place] ([id number]) on 07/21/2009 2:54:08 PM      Also confirmed by Arnoldo Lenis, M.D., [place] ([id number]), editor Abbey Chatters [phone])        on 07/25/2009 4:16:39 PM    URINALYSIS Kareem Cathey/ REFLEX CULTURE   Component Value Range   ??? Color YELLOW     ??? Appearance CLOUDY     ??? Specific gravity 1.020  1.003 - 1.030 ( )   ??? pH 5.5  5.0 - 8.0 ( )   ??? Protein 30 (*) NEGATIVE (MG/DL)   ??? Glucose NEGATIVE   NEGATIVE (MG/DL)   ??? Ketone NEGATIVE   NEGATIVE (MG/DL)   ??? Bilirubin NEGATIVE   NEGATIVE    ??? Blood NEGATIVE   NEGATIVE    ??? Urobilinogen 0.2  0.2 - 1.0 (EU/DL)   ??? Nitrites NEGATIVE   NEGATIVE    ??? Leukocyte Esterase NEGATIVE   NEGATIVE    ??? UA:UC IF INDICATED CULTURE NOT INDICATED BY UA RESULT     ??? WBC 0-4  0 - 4 (/HPF)   ??? RBC 0-3  0 - 5 (/HPF)   ??? Epithelial cells 0-5  0 - 5 (/LPF)   ??? Bacteria NEGATIVE   NEGATIVE (/HPF)   ??? Hyaline Cast 0-2  0 - 2    STRESS TEST MYOVIEW   Component Value Range   ??? Diagnosis        Value: Confirmed by Rhea Pink ([id number]), editor Gasper Sells, [employer] [phone]) on       07/23/2009 8:25:35 AM   ??? Test indication Chest Discomfort     ??? Functional capacity Normal     ??? ECG Interp. Before Exercise left ventricular hypertrophy     ??? ECG Interp. During Exercise Not meeting criteria for ischemia     ??? Overall HR response to exercise appropriate     ??? Overall BP response to exercise resting hypertension - blunted response     ??? Max. Systolic BP 170  (mmHg)   ??? Max. Diastolic BP 90  (mmHg)   ??? Max. Heart rate 157  (BPM)   ???  Duke treadmill score       ??? Duke TM score result       ??? Peak Ex METs 6.3  (METS)   ??? Protocol name BRUCE                ??? Known cardiac condition       ??? Attending physician [facility] MD.            Assessment/Plan  1. Lumbar sprain (847.2B)    2. Cervical sprain (847.0T)    3. HTN (hypertension) (401.9AF)      Heat . Valium , percocet prn low back pain  And cervical sprain.  . See DR. Sciocssia.. Call back here prn. Renewed his bp meds. He ran out of  1                                                                          Author:  Ysidro Evert, MD 10:24 PM11/08/2009

## 2010-06-17 ENCOUNTER — Encounter

## 2010-08-19 ENCOUNTER — Encounter

## 2010-08-26 NOTE — Telephone Encounter (Signed)
Pt was in hospital yesterday and needs a follow up was having back problems  Pt ph [phone]

## 2010-08-26 NOTE — Patient Instructions (Signed)
MyChart Activation    Thank you for requesting access to MyChart. Please follow the instructions below to securely access and download your online medical record. MyChart allows you to send messages to your doctor, view your test results, renew your prescriptions, schedule appointments, and more.    How Do I Sign Up?    1. In your internet browser, go to [email or web address]  2. Click on the First Time User? Click Here link in the Sign In box. You will be redirect to the New Member Sign Up page.  3. Enter your MyChart Access Code exactly as it appears below. You will not need to use this code after you???ve completed the sign-up process. If you do not sign up before the expiration date, you must request a new code.    MyChart Access Code: [email or web address]  Expires: 11/24/10 04:14 PM (This is the date your MyChart access code will expire)    4. Enter the last four digits of your Social Security Number (xxxx) and Date of Birth (mm/dd/yyyy) as indicated and click Submit. You will be taken to the next sign-up page.  5. Create a MyChart ID. This will be your MyChart login ID and cannot be changed, so think of one that is secure and easy to remember.  6. Create a MyChart password. You can change your password at any time.  7. Enter your Password Reset Question and Answer. This can be used at a later time if you forget your password.   8. Enter your e-mail address. You will receive e-mail notification when new information is available in MyChart.  9. Click Sign Up. You can now view and download portions of your medical record.  10. Click the Download Summary menu link to download a portable copy of your medical information.    Additional Information    If you have questions, please call [phone]. Remember, MyChart is NOT to be used for urgent needs. For medical emergencies, dial 911.  Appt. [name]/ dR. Sciioscia. No work 08/26/10 through 08/30/10.  Ask him about further work restrictions if any.

## 2010-08-26 NOTE — Telephone Encounter (Signed)
Scheduled appt

## 2010-08-27 NOTE — Progress Notes (Signed)
[name] is a 45 y.o. male     SUBJECTIVE:    Patient  C/o  5 days ago lumbar spine contused.  2days ago heavy  Lifting associ'd Sirenity Shew/ left sciatica evaluated by [facility]  [facility]. D/c'd home [facility]/ prn : Valium, Percocet, and muscle relaxer.  Denies saddle anesthesia. Occasional urge fecal near incontinence -difficulty getting to toilet due to leg pain  Past Medical History   Diagnosis Date   ??? Chronic kidney disease    ??? Hypertension    ??? Anemia    ??? Coronary artery disease    ??? Low back pain        Current outpatient prescriptions   Medication Sig Dispense Refill   ??? amLODIPine (NORVASC) 10 mg tablet Take  by mouth daily.       ??? ergocalciferol (VITAMIN D) 50,000 unit capsule Take 50,000 Units by mouth.       ??? oxyCODONE-acetaminophen (PERCOCET) 5-325 mg per tablet Take 1 Tab by mouth every four (4) hours as needed.       ??? ibuprofen (MOTRIN) 600 mg tablet Take 1 Tab by mouth every six (6) hours as needed for Pain.  20 Tab  0   ??? diazepam (VALIUM) 5 mg tablet Take 1 Tab by mouth every eight (8) hours as needed.  20 Tab  0   ??? hydrALAZINE (APRESOLINE) 50 mg tablet Take 50 mg by mouth three (3) times daily. Indications: HYPERTENSION       ??? metoprolol (LOPRESSOR) 25 mg tablet Take 100 mg by mouth two (2) times a day. Indications: HYPERTENSION       ??? clopidogrel (PLAVIX) 75 mg tablet Take 75 mg by mouth daily. Indications: UNSTABLE ANGINA PECTORIS           Not on File  .History   Smoking status   ??? Current Everyday Smoker -- 0.5 packs/day for 15 years   Smokeless tobacco   ??? Never Used         Review of Systems  Review of Systems - negative except as per HPI      Physical Examination:  BP 180/120   Wt 206 lb (93.441 kg)  Physical Exam    General: Appears in no acute distress  Lung-  Heart -   Abdomen -  Extremities: + st. Leg raise on Left. Able to stand on toes and heels. Left leg strength intact. Hip: prom +pain in low back.    . Results for orders placed during the hospital encounter of 07/18/09    CBC WITH AUTOMATED DIFF   Component Value Range   ??? WBC 6.4  4.1 - 11.1 (K/uL)   ??? RBC 3.70 (*) 4.10 - 5.70 (M/uL)   ??? HGB 11.6 (*) 12.1 - 17.0 (g/dL)   ??? HCT 34.4 (*) 36.6 - 50.3 (%)   ??? MCV 93.0  80.0 - 99.0 (FL)   ??? MCH 31.4  26.0 - 34.0 (PG)   ??? MCHC 33.7  30.0 - 36.5 (g/dL)   ??? RDW 13.7  11.5 - 14.5 (%)   ??? PLATELET 174  150 - 400 (K/uL)   ??? NEUTROPHILS 63  32 - 75 (%)   ??? LYMPHOCYTES 26  12 - 49 (%)   ??? MONOCYTES 9  5 - 13 (%)   ??? EOSINOPHILS 2  0 - 7 (%)   ??? BASOPHILS 0  0 - 1 (%)   ??? ABSOLUTE NEUTS 4.0  1.8 - 8.0 (K/UL)   ???  ABSOLUTE LYMPHS 1.7  0.8 - 3.5 (K/UL)   ??? ABSOLUTE MONOS 0.6  0.0 - 1.0 (K/UL)   ??? ABSOLUTE EOSINS 0.1  0.0 - 0.4 (K/UL)   ??? ABSOLUTE BASOS 0.0  0.0 - 0.1 (K/UL)   CK Willine Schwalbe/ REFLX CKMB   Component Value Range   ??? CK 209  35 - 232 (U/L)   TROPONIN I   Component Value Range   ??? Troponin-I, Qt. 0.12 (*) <0.05 (ng/mL)   METABOLIC PANEL, COMPREHENSIVE   Component Value Range   ??? Sodium 140  136 - 145 (MMOL/L)   ??? Potassium 4.0  3.5 - 5.1 (MMOL/L)   ??? Chloride 109 (*) 97 - 108 (MMOL/L)   ??? CO2 19 (*) 21 - 32 (MMOL/L)   ??? Anion gap 12  5 - 15 (mmol/L)   ??? Glucose 87  65 - 100 (MG/DL)   ??? BUN 29 (*) 6 - 20 (MG/DL)   ??? Creatinine 4.3 (*) 0.6 - 1.3 (MG/DL)   ??? BUN/Creatinine ratio 7 (*) 12 - 20 ( )   ??? GFR est AA 20 (*) >60 (ml/min/1.76m2)   ??? GFR est non-AA 16 (*) >60 (ml/min/1.23m2)   ??? Calcium 8.7  8.5 - 10.1 (MG/DL)   ??? Bilirubin, total 0.3  0.2 - 1.0 (MG/DL)   ??? ALT 19  12 - 78 (U/L)   ??? AST 20  15 - 37 (U/L)   ??? Alk. phosphatase 55  50 - 136 (U/L)   ??? Protein, total 8.3 (*) 6.4 - 8.2 (g/dL)   ??? Albumin 3.8  3.5 - 5.0 (g/dL)   ??? Globulin 4.5 (*) 2.0 - 4.0 (g/dL)   ??? A-G Ratio 0.8 (*) 1.1 - 2.2 ( )   PTT   Component Value Range   ??? aPTT 29.4  24.0 - 33.0 (sec)   PROTHROMBIN TIME   Component Value Range   ??? INR 1.0  0.9 - 1.1 ( )   ??? Prothrombin Time-PT 10.7  9.0 - 11.0 (SECS)   EKG, 12 LEAD, INITIAL   Component Value Range   ??? Ventricular Rate 66  (BPM)   ??? Atrial Rate 66  (BPM)    ??? P-R Interval 156  (ms)   ??? QRS Duration 78  (ms)   ??? Q-T Interval 438  (ms)   ??? QTC Calculation (Bezet) 459  (ms)   ??? Calculated P Axis 61  (degrees)   ??? Calculated R Axis 8  (degrees)   ??? Calculated T Axis -124  (degrees)   ??? Diagnosis        Value: Normal sinus rhythm      Left ventricular hypertrophy with repolarization abnormality      No previous ECGs available      Confirmed by Gasper Lloyd, M.D., [place] ([id number]) on 07/19/2009 9:20:15 AM   BNP   Component Value Range   ??? B-NP 116 (*) 0 - 100 (pg/mL)   POC TROPONIN-I   Component Value Range   ??? Troponin-I (POC) 0.04  0.00 - 0.08 (ng/mL)   METABOLIC PANEL, BASIC   Component Value Range   ??? Sodium 141  136 - 145 (MMOL/L)   ??? Potassium 4.0  3.5 - 5.1 (MMOL/L)   ??? Chloride 107  97 - 108 (MMOL/L)   ??? CO2 23  21 - 32 (MMOL/L)   ??? Anion gap 11  5 - 15 (mmol/L)   ??? Glucose 95  65 - 100 (MG/DL)   ??? BUN 30 (*)  6 - 20 (MG/DL)   ??? Creatinine 4.4 (*) 0.6 - 1.3 (MG/DL)   ??? BUN/Creatinine ratio 7 (*) 12 - 20 ( )   ??? GFR est AA 19 (*) >60 (ml/min/1.24m2)   ??? GFR est non-AA 16 (*) >60 (ml/min/1.103m2)   ??? Calcium 8.6  8.5 - 10.1 (MG/DL)   TROPONIN I   Component Value Range   ??? Troponin-I, Qt. 0.11 (*) <0.05 (ng/mL)   CK   Component Value Range   ??? CK 170  35 - 232 (U/L)   EKG, 12 LEAD, INITIAL   Component Value Range   ??? Ventricular Rate 74  (BPM)   ??? Atrial Rate 74  (BPM)   ??? P-R Interval 168  (ms)   ??? QRS Duration 88  (ms)   ??? Q-T Interval 426  (ms)   ??? QTC Calculation (Bezet) 472  (ms)   ??? Calculated P Axis 70  (degrees)   ??? Calculated R Axis 9  (degrees)   ??? Calculated T Axis -88  (degrees)   ??? Diagnosis        Value: Normal sinus rhythm      Voltage criteria for left ventricular hypertrophy      T wave abnormality, consider inferolateral ischemia      Prolonged QT      Confirmed by Arnoldo Lenis, M.D., [place] ([id number]) on 07/21/2009 2:54:08 PM      Also confirmed by Arnoldo Lenis, M.D., [place] ([id number]), editor Abbey Chatters [phone])        on 07/25/2009 4:16:39 PM    URINALYSIS Riley Papin/ REFLEX CULTURE   Component Value Range   ??? Color YELLOW     ??? Appearance CLOUDY     ??? Specific gravity 1.020  1.003 - 1.030 ( )   ??? pH 5.5  5.0 - 8.0 ( )   ??? Protein 30 (*) NEGATIVE (MG/DL)   ??? Glucose NEGATIVE   NEGATIVE (MG/DL)   ??? Ketone NEGATIVE   NEGATIVE (MG/DL)   ??? Bilirubin NEGATIVE   NEGATIVE    ??? Blood NEGATIVE   NEGATIVE    ??? Urobilinogen 0.2  0.2 - 1.0 (EU/DL)   ??? Nitrites NEGATIVE   NEGATIVE    ??? Leukocyte Esterase NEGATIVE   NEGATIVE    ??? UA:UC IF INDICATED CULTURE NOT INDICATED BY UA RESULT     ??? WBC 0-4  0 - 4 (/HPF)   ??? RBC 0-3  0 - 5 (/HPF)   ??? Epithelial cells 0-5  0 - 5 (/LPF)   ??? Bacteria NEGATIVE   NEGATIVE (/HPF)   ??? Hyaline Cast 0-2  0 - 2    STRESS TEST MYOVIEW   Component Value Range   ??? Diagnosis        Value: Confirmed by Rhea Pink ([id number]), editor Gasper Sells, [employer] [phone]) on       07/23/2009 8:25:35 AM   ??? Test indication Chest Discomfort     ??? Functional capacity Normal     ??? ECG Interp. Before Exercise left ventricular hypertrophy     ??? ECG Interp. During Exercise Not meeting criteria for ischemia     ??? Overall HR response to exercise appropriate     ??? Overall BP response to exercise resting hypertension - blunted response     ??? Max. Systolic BP 170  (mmHg)   ??? Max. Diastolic BP 90  (mmHg)   ??? Max. Heart rate 157  (BPM)   ??? Duke treadmill score       ???  Duke TM score result       ??? Peak Ex METs 6.3  (METS)   ??? Protocol name BRUCE                ??? Known cardiac condition       ??? Attending physician [facility] MD.            Assessment/Plan  1. Sciatica of left side (724.3W)  REFERRAL TO ORTHOPEDICS   2. HTN (hypertension) (401.9AF)         No work 1/30-08/30/10.  See Dr. Aris Everts in f/u soon. Same meds. No heavy lifting for now. Pain likely impacting his bp.  Follow up PRN.    Author:  Ysidro Evert, MD 10:37 PM1/31/2012

## 2010-09-02 ENCOUNTER — Encounter

## 2010-09-09 ENCOUNTER — Telehealth

## 2010-09-09 NOTE — Telephone Encounter (Signed)
Message copied by Coralyn Pear IV on Mon Sep 09, 2010  5:30 PM  ------       Message from: Sabino Donovan       Created: Mon Sep 09, 2010  3:14 PM       Regarding: spec.       Contact: [phone]         [name] needs to speak to you about getting a sooner back spec appt.       # [phone]

## 2010-09-13 NOTE — Patient Instructions (Signed)
Stop smoking. Use Diazepam and Percocet as needed for back pain. See dr. Lodema Hong on 09/16/10

## 2010-09-15 NOTE — Progress Notes (Signed)
[name] is a 45 y.o. male     SUBJECTIVE:    Patient  C/o persisting Lt sciatica  And out  Of work for completed so he is out through 09/09/19  C/o lower extr . Edema.  Past Medical History   Diagnosis Date   ??? Chronic kidney disease    ??? Hypertension    ??? Anemia    ??? Coronary artery disease    ??? Low back pain      Current Outpatient Prescriptions   Medication Sig Dispense Refill   ??? amLODIPine (NORVASC) 10 mg tablet Take  by mouth daily.       ??? ergocalciferol (VITAMIN D) 50,000 unit capsule Take 50,000 Units by mouth.       ??? oxyCODONE-acetaminophen (PERCOCET) 5-325 mg per tablet Take 1 Tab by mouth every four (4) hours as needed.       ??? ibuprofen (MOTRIN) 600 mg tablet Take 1 Tab by mouth every six (6) hours as needed for Pain.  20 Tab  0   ??? diazepam (VALIUM) 5 mg tablet Take 1 Tab by mouth every eight (8) hours as needed.  20 Tab  0   ??? hydrALAZINE (APRESOLINE) 50 mg tablet Take 50 mg by mouth three (3) times daily. Indications: HYPERTENSION       ??? metoprolol (LOPRESSOR) 25 mg tablet Take 100 mg by mouth two (2) times a day. Indications: HYPERTENSION       ??? clopidogrel (PLAVIX) 75 mg tablet Take 75 mg by mouth daily. Indications: UNSTABLE ANGINA PECTORIS         No Known Allergies  .  History   Smoking status   ??? Current Some Day Smoker -- 0.5 packs/day for 15 years   Smokeless tobacco   ??? Never Used       Review of Systems  Review of Systems - negative except as per HPI      Physical Examination:  BP 160/100   Ht 5\' 11"  (1.803 m)   Wt 214 lb (97.07 kg)   BMI 29.85 kg/m2  Physical Exam    General: Appears in no acute distress  Lung-clear  Heart - reg  Abdomen -bs+, soft, nt  Extremities - 1+ pretib b/l  Edema.  Lt +st. Leg raise  Barely able to stand heels and toes.     .   Results for orders placed during the hospital encounter of 07/18/09   CBC WITH AUTOMATED DIFF       Component Value Range    WBC 6.4  4.1 - 11.1 (K/uL)    RBC 3.70 (*) 4.10 - 5.70 (M/uL)    HGB 11.6 (*) 12.1 - 17.0 (g/dL)     HCT 16.1 (*) 09.6 - 50.3 (%)    MCV 93.0  80.0 - 99.0 (FL)    MCH 31.4  26.0 - 34.0 (PG)    MCHC 33.7  30.0 - 36.5 (g/dL)    RDW 04.5  40.9 - 81.1 (%)    PLATELET 174  150 - 400 (K/uL)    NEUTROPHILS 63  32 - 75 (%)    LYMPHOCYTES 26  12 - 49 (%)    MONOCYTES 9  5 - 13 (%)    EOSINOPHILS 2  0 - 7 (%)    BASOPHILS 0  0 - 1 (%)    ABSOLUTE NEUTS 4.0  1.8 - 8.0 (K/UL)    ABSOLUTE LYMPHS 1.7  0.8 - 3.5 (K/UL)    ABSOLUTE  MONOS 0.6  0.0 - 1.0 (K/UL)    ABSOLUTE EOSINS 0.1  0.0 - 0.4 (K/UL)    ABSOLUTE BASOS 0.0  0.0 - 0.1 (K/UL)   CK [name]/ REFLX CKMB       Component Value Range    CK 209  35 - 232 (U/L)   TROPONIN I       Component Value Range    Troponin-I, Qt. 0.12 (*) <0.05 (ng/mL)   METABOLIC PANEL, COMPREHENSIVE       Component Value Range    Sodium 140  136 - 145 (MMOL/L)    Potassium 4.0  3.5 - 5.1 (MMOL/L)    Chloride 109 (*) 97 - 108 (MMOL/L)    CO2 19 (*) 21 - 32 (MMOL/L)    Anion gap 12  5 - 15 (mmol/L)    Glucose 87  65 - 100 (MG/DL)    BUN 29 (*) 6 - 20 (MG/DL)    Creatinine 4.3 (*) 0.6 - 1.3 (MG/DL)    BUN/Creatinine ratio 7 (*) 12 - 20 ( )    GFR est AA 20 (*) >60 (ml/min/1.38m2)    GFR est non-AA 16 (*) >60 (ml/min/1.42m2)    Calcium 8.7  8.5 - 10.1 (MG/DL)    Bilirubin, total 0.3  0.2 - 1.0 (MG/DL)    ALT 19  12 - 78 (U/L)    AST 20  15 - 37 (U/L)    Alk. phosphatase 55  50 - 136 (U/L)    Protein, total 8.3 (*) 6.4 - 8.2 (g/dL)    Albumin 3.8  3.5 - 5.0 (g/dL)    Globulin 4.5 (*) 2.0 - 4.0 (g/dL)    A-G Ratio 0.8 (*) 1.1 - 2.2 ( )   PTT       Component Value Range    aPTT 29.4  24.0 - 33.0 (sec)   PROTHROMBIN TIME       Component Value Range    INR 1.0  0.9 - 1.1 ( )    Prothrombin Time-PT 10.7  9.0 - 11.0 (SECS)   EKG, 12 LEAD, INITIAL       Component Value Range    Ventricular Rate 66      Atrial Rate 66      P-R Interval 156      QRS Duration 78      Q-T Interval 438      QTC Calculation (Bezet) 459      Calculated P Axis 61      Calculated R Axis 8      Calculated T Axis -124      Diagnosis         Value: Normal sinus rhythm      Left ventricular hypertrophy with repolarization abnormality      No previous ECGs available      Confirmed by Gasper Lloyd, M.D., [place] ([id number]) on 07/19/2009 9:20:15 AM   BNP       Component Value Range    B-NP 116 (*) 0 - 100 (pg/mL)   POC TROPONIN-I       Component Value Range    Troponin-I (POC) 0.04  0.00 - 0.08 (ng/mL)   METABOLIC PANEL, BASIC       Component Value Range    Sodium 141  136 - 145 (MMOL/L)    Potassium 4.0  3.5 - 5.1 (MMOL/L)    Chloride 107  97 - 108 (MMOL/L)    CO2 23  21 - 32 (MMOL/L)  Anion gap 11  5 - 15 (mmol/L)    Glucose 95  65 - 100 (MG/DL)    BUN 30 (*) 6 - 20 (MG/DL)    Creatinine 4.4 (*) 0.6 - 1.3 (MG/DL)    BUN/Creatinine ratio 7 (*) 12 - 20 ( )    GFR est AA 19 (*) >60 (ml/min/1.31m2)    GFR est non-AA 16 (*) >60 (ml/min/1.57m2)    Calcium 8.6  8.5 - 10.1 (MG/DL)   TROPONIN I       Component Value Range    Troponin-I, Qt. 0.11 (*) <0.05 (ng/mL)   CK       Component Value Range    CK 170  35 - 232 (U/L)   EKG, 12 LEAD, INITIAL       Component Value Range    Ventricular Rate 74      Atrial Rate 74      P-R Interval 168      QRS Duration 88      Q-T Interval 426      QTC Calculation (Bezet) 472      Calculated P Axis 70      Calculated R Axis 9      Calculated T Axis -88      Diagnosis        Value: Normal sinus rhythm      Voltage criteria for left ventricular hypertrophy      T wave abnormality, consider inferolateral ischemia      Prolonged QT      Confirmed by Arnoldo Lenis, M.D., [place] ([id number]) on 07/21/2009 2:54:08 PM      Also confirmed by Arnoldo Lenis, M.D., [place] ([id number]), editor Abbey Chatters [phone])        on 07/25/2009 4:16:39 PM   URINALYSIS [name]/ REFLEX CULTURE       Component Value Range    Color YELLOW      Appearance CLOUDY      Specific gravity 1.020  1.003 - 1.030 ( )    pH 5.5  5.0 - 8.0 ( )    Protein 30 (*) NEGATIVE (MG/DL)    Glucose NEGATIVE   NEGATIVE (MG/DL)    Ketone NEGATIVE   NEGATIVE (MG/DL)    Bilirubin NEGATIVE   NEGATIVE      Blood NEGATIVE   NEGATIVE     Urobilinogen 0.2  0.2 - 1.0 (EU/DL)    Nitrites NEGATIVE   NEGATIVE     Leukocyte Esterase NEGATIVE   NEGATIVE     UA:UC IF INDICATED CULTURE NOT INDICATED BY UA RESULT      WBC 0-4  0 - 4 (/HPF)    RBC 0-3  0 - 5 (/HPF)    Epithelial cells 0-5  0 - 5 (/LPF)    Bacteria NEGATIVE   NEGATIVE (/HPF)    Hyaline Cast 0-2  0 - 2    STRESS TEST MYOVIEW       Component Value Range    Diagnosis        Value: Confirmed by Rhea Pink [phone]), editor Gasper Sells, [employer] [phone]) on       07/23/2009 8:25:35 AM    Test indication Chest Discomfort      Functional capacity Normal      ECG Interp. Before Exercise left ventricular hypertrophy      ECG Interp. During Exercise Not meeting criteria for ischemia      Overall HR response to exercise appropriate      Overall BP response to exercise resting  hypertension - blunted response      Max. Systolic BP 170      Max. Diastolic BP 90      Max. Heart rate 157      Duke treadmill score        Duke TM score result        Peak Ex METs 6.3      Protocol name BRUCE                 Known cardiac condition        Attending physician [facility] MD.          Assessment/Plan  1. Low back pain    2. Lumbar spinal stenosis    3. HTN (hypertension)        Renewed Valium and Percocet . Advised rest.     Out from  Through 2/21. appt Lakes[facility] Levinson/ Dr. Lodema Hong  Soon.  bp chronically difficutl to rx. bp likely aggravated by  Back pain. Same rx .  Fu Nephrology soon.   Suspected gout. Advised  Stopping Aleve now. Tylenol prn back pain No smoking, advised.     Author:  Ysidro Evert, MD 9:43 PM2/19/2012

## 2010-10-25 MED ORDER — AMLODIPINE 10 MG TAB
10 mg | ORAL_TABLET | ORAL | Status: DC
Start: 2010-10-25 — End: 2011-06-12

## 2011-03-20 MED ORDER — HYDRALAZINE 50 MG TAB
50 mg | ORAL_TABLET | ORAL | Status: DC
Start: 2011-03-20 — End: 2011-06-12

## 2011-06-09 ENCOUNTER — Inpatient Hospital Stay
Admit: 2011-06-09 | Discharge: 2011-06-12 | Disposition: A | Discharge status: Home / Self Care | Payer: PRIVATE HEALTH INSURANCE | Attending: Internal Medicine | Admitting: Internal Medicine

## 2011-06-09 DIAGNOSIS — I6350 Cerebral infarction due to unspecified occlusion or stenosis of unspecified cerebral artery: Secondary | ICD-10-CM

## 2011-06-09 LAB — CBC WITH AUTOMATED DIFF
ABS. BASOPHILS: 0 10*3/uL (ref 0.0–0.1)
ABS. EOSINOPHILS: 0.1 10*3/uL (ref 0.0–0.4)
ABS. LYMPHOCYTES: 1.3 10*3/uL (ref 0.8–3.5)
ABS. MONOCYTES: 0.4 10*3/uL (ref 0.0–1.0)
ABS. NEUTROPHILS: 2.3 10*3/uL (ref 1.8–8.0)
BASOPHILS: 1 % (ref 0–1)
EOSINOPHILS: 3 % (ref 0–7)
HCT: 35.8 % — ABNORMAL LOW (ref 36.6–50.3)
HGB: 11.8 g/dL — ABNORMAL LOW (ref 12.1–17.0)
LYMPHOCYTES: 31 % (ref 12–49)
MCH: 30.8 PG (ref 26.0–34.0)
MCHC: 33 g/dL (ref 30.0–36.5)
MCV: 93.5 FL (ref 80.0–99.0)
MONOCYTES: 11 % (ref 5–13)
NEUTROPHILS: 54 % (ref 32–75)
PLATELET: 161 10*3/uL (ref 150–400)
RBC: 3.83 M/uL — ABNORMAL LOW (ref 4.10–5.70)
RDW: 14 % (ref 11.5–14.5)
WBC: 4.2 10*3/uL (ref 4.1–11.1)

## 2011-06-09 LAB — METABOLIC PANEL, COMPREHENSIVE
A-G Ratio: 0.9 — ABNORMAL LOW (ref 1.1–2.2)
ALT (SGPT): 17 U/L (ref 12–78)
AST (SGOT): 18 U/L (ref 15–37)
Albumin: 3.6 g/dL (ref 3.5–5.0)
Alk. phosphatase: 52 U/L (ref 50–136)
Anion gap: 9 mmol/L (ref 5–15)
BUN/Creatinine ratio: 9 — ABNORMAL LOW (ref 12–20)
BUN: 31 MG/DL — ABNORMAL HIGH (ref 6–20)
Bilirubin, total: 0.3 MG/DL (ref 0.2–1.0)
CO2: 20 MMOL/L — ABNORMAL LOW (ref 21–32)
Calcium: 8.5 MG/DL (ref 8.5–10.1)
Chloride: 112 MMOL/L — ABNORMAL HIGH (ref 97–108)
Creatinine: 3.59 MG/DL — ABNORMAL HIGH (ref 0.45–1.15)
GFR est AA: 23 mL/min/{1.73_m2} — ABNORMAL LOW (ref >60)
GFR est non-AA: 19 mL/min/{1.73_m2} — ABNORMAL LOW (ref >60)
Globulin: 3.9 g/dL (ref 2.0–4.0)
Glucose: 91 MG/DL (ref 65–100)
Potassium: 4.2 MMOL/L (ref 3.5–5.1)
Protein, total: 7.5 g/dL (ref 6.4–8.2)
Sodium: 141 MMOL/L (ref 136–145)

## 2011-06-09 LAB — EKG, 12 LEAD, INITIAL
Atrial Rate: 60 {beats}/min
Calculated P Axis: 50 degrees
Calculated R Axis: 0 degrees
Calculated T Axis: -15 degrees
P-R Interval: 166 ms
Q-T Interval: 424 ms
QRS Duration: 88 ms
QTC Calculation (Bezet): 424 ms
Ventricular Rate: 60 {beats}/min

## 2011-06-09 LAB — PROTHROMBIN TIME + INR
INR: 1 (ref 0.9–1.1)
Prothrombin time: 10.6 s (ref 9.4–11.7)

## 2011-06-09 LAB — TSH 3RD GENERATION: TSH: 1.05 u[IU]/mL (ref 0.36–3.74)

## 2011-06-09 LAB — CK W/ CKMB & INDEX
CK - MB: 1.3 NG/ML (ref 0.5–3.6)
CK-MB Index: 0.6 (ref 0–2.5)
CK: 231 U/L (ref 39–308)

## 2011-06-09 LAB — TROPONIN I: Troponin-I, Qt.: 0.07 ng/mL — ABNORMAL HIGH (ref <0.05)

## 2011-06-09 LAB — PTT: aPTT: 28.6 s (ref 24.0–31.5)

## 2011-06-09 MED ADMIN — hydrALAZINE (APRESOLINE) tablet 50 mg: ORAL | @ 23:00:00 | NDC 62584073411

## 2011-06-09 MED ADMIN — aspirin chewable tablet 325 mg: ORAL | @ 19:00:00 | NDC 63739043401

## 2011-06-09 MED ADMIN — 0.9% sodium chloride infusion: INTRAVENOUS | @ 23:00:00 | NDC 00409798348

## 2011-06-09 MED ADMIN — metoprolol (LOPRESSOR) tablet 100 mg: ORAL | @ 23:00:00 | NDC 62584026611

## 2011-06-09 MED ADMIN — 0.9% sodium chloride infusion: INTRAVENOUS | @ 19:00:00 | NDC 00409798348

## 2011-06-09 NOTE — H&P (Signed)
Name:       [name], [name]             Admitted:    06/09/2011    Account #:  [id number]                     DOB:         Feb 23, 1966  Physician:  Britt Boozer. Doreene Eland, M.D.          Age:         45                               HISTORY AND PHYSICAL      CHIEF COMPLAINT: A 45 year old African American male admitted with probable  CVA.    HISTORY OF PRESENT ILLNESS: The patient has a history of severe  hypertension, chronic kidney disease stage IV, medical noncompliance, an  episode of accelerated hypertension in 2008 with vision obscuration, an  episode of right arm discomfort and accelerated hypertension with negative  stress Cardiolite in 2010. Yesterday while in a restaurant he developed  right hemiparesis transiently, lasting 15-20 minutes, and may have been  associated numbness. He went home and experienced a recurrence of the right  hemiparesis, this time associated with right facial droop.  He had rt leg tingling .He elected to  stay home. This morning, he again had a right facial droop and had  expressive aphasia.[name] He came to the  ER, head CT was negative. He was admitted for probable CVA, having been  seen by consulting neurologist, Jethro Bolus, MD.    PAST MEDICAL HISTORY: Illnesses: Severe hypertension, chronic kidney  disease stage IV, secondary to hypertensive nephrosclerosis, followed by  Dr. Ignacia Bayley, hypertension, anemia. Abnormal stress Cardiolite a  number of years ago, prior to 2010 and it was abnormal. No cardiac  catheterization done because of his chronic kidney disease. Low back pain,  secondary hyperparathyroidism, thrombocytopenia, and accelerated  hypertension admissions as mentioned.    PAST SURGICAL HISTORY: Removal of skin abscess from the chest remotely.  Right knee meniscus repair, appendectomy.    ALLERGIES: NO KNOWN DRUG ALLERGIES.    MEDICATIONS  1. Metoprolol.  100 mg b.i.d.  2. Amlodipine.  10 mg b.i.d.  3. Hydralazine 50 mg t.i.d.     FAMILY HISTORY: Mother, cancer, diabetes. Father, cancer. Maternal  grandfather and paternal grandfather, cancer.    SOCIAL HISTORY: Current some day smoker of cigarettes. ETOH social. He  admits to smoking cannabis, but denies use of cocaine or other illicit  substances.    REVIEW OF SYSTEMS: Denies headache, fever, dizziness, palpitations,  dyspnea, chest pain, abdominal pain, hematochezia, urinary difficulties.    PHYSICAL EXAMINATION  VITAL SIGNS: 98.1, 64, 150/107. Follow up BP 145/102, 100% on room air  saturation.  HEENT: Slight right facial droop, otherwise negative.  NECK: No carotid bruits heard.  LUNGS: Clear.  HEART: Regular without murmur, gallop, or rub.  ABDOMEN: BS positive, soft, no mass felt, nontender, nondistended.  EXTREMITIES: Nontender. No pedal edema.  NEUROLOGIC: He is alert and oriented x3. Slight expressive aphasia,  moderate right arm hemiparesis, mild right leg hemiparesis. Decreased light  touch sensation in the right thigh. Remainder of neuro exam is nonfocal.    LABORATORY EVALUATION: EKG normal sinus rhythm, consistent with left  ventricular hypertrophy.    Troponin I 0.07. INR 1.0. PTT 28.6. BUN 31, creatinine 3.6, LFTs normal,  CO2  20, anion gap 9. WBC 4.2, hemoglobin 11.8, hematocrit 35.8, platelet  161.    Portable chest x-ray, no acute process. No change.    Head CT, scattered foci of nonspecific white matter hypoattenuation. No  intracranial bleed or shift of the midline.    Preliminary carotid Doppler consistent with no stenosis of the right  internal carotid, no stenosis of the left internal carotid, and vertebrals  are patent with antegrade flow.    Brain MRI and MRA are pending.    Echocardiogram result is pending.    IMPRESSION  1. Probable CVA.  2. Severe hypertension.  3. Chronic kidney disease, stage IV.  4. Chronic anemia.  5. Borderline troponin I may be due to chronic kidney disease.  6. Stress Cardiolite was negative in 2010, here at [facility].     PLAN: neurology input is appreciated. The patient has been restarted on  Plavix, per recommendation of neurologist. Echocardiogram, head MRI, head  MRA results, lipid profile pending. PT/OT are ordered. The patient knows he  should quit smoking.              Britt Boozer. Doreene Eland, M.D.    cc:   Britt Boozer. Doreene Eland, M.D.      [facility]/wmx; D: 06/09/2011  6:26 P; T: 06/09/2011  7:26 P; DOC# [id number]; Job#  [id number]

## 2011-06-09 NOTE — Progress Notes (Signed)
H and P dict  1. Probable CVA  2. Severe HTN  3. CKD stage 4  4. Chronic anemia  5. Borderline Trop I may be due to CKD  6. Stress Cardiolite 2010 neg . Here    Neuro input appreciated.   Corryn Madewell/u of CVA continues.  LMOM for wife .

## 2011-06-09 NOTE — ED Provider Notes (Signed)
HPI Comments: This is a 45 y.o.male who presents to the ED secondary to right leg weakness. Yesterday at [employer] and began with numb feeling in his leg and weakness; he had to sit down at that time- symptoms lasted 20 minutes. The sx went away for awhile- pt returned home and laid down. Later that day the sx returned to his leg; he reports "it felt dead." Later that night he had difficulty using a fork with the right hand at dinner- this lasted 45 minutes. Wife notes that today patient's voice has been different- she had him smile and it looked asymmetrical last night- noted right sided droop that is still present Pt still having right arm and leg trouble today. He states he can move these extremities but not as well as he can on the left; Speech is still not normal, per family.  Pt has hx of kidney failure- not on dialysis. He has hx of CAD but has not had angioplasty because of his kidney failure. Pt did not take any medications today. No hx of any similar weakness.     Cardiologist; Dr. Brooke Bonito. Neurologist: Dr. Tiburcio Pea  PCP: Ysidro Evert, MD  Social hx: +smoker; occasional EtOH    Note written by Aris Lot, Scribe, as dictated by Domingo Cocking, MD 12:57 PM        The history is provided by the patient, the spouse and a relative.        Past Medical History   Diagnosis Date   ??? Chronic kidney disease    ??? Hypertension    ??? Anemia    ??? Coronary artery disease    ??? Low back pain         Past Surgical History   Procedure Date   ??? Pr acne surgery of skin abscess    ??? Hx orthopaedic      right miniscus repair         No family history on file.     History     Social History   ??? Marital Status: Married     Spouse Name: N/A     Number of Children: N/A   ??? Years of Education: N/A     Occupational History   ??? Not on file.     Social History Main Topics   ??? Smoking status: Current Some Day Smoker -- 0.5 packs/day for 15 years   ??? Smokeless tobacco: Never Used   ??? Alcohol Use: 6.0 oz/week      12 Cans of beer per week   ??? Drug Use: Yes     Special: Marijuana   ??? Sexually Active: Not on file     Other Topics Concern   ??? Not on file     Social History Narrative   ??? No narrative on file                  ALLERGIES: Review of patient's allergies indicates no known allergies.      Review of Systems   Constitutional: Negative for fever and chills.   Respiratory: Negative for shortness of breath.    Cardiovascular: Negative for chest pain.   Genitourinary: Negative for difficulty urinating.   Musculoskeletal: Negative for back pain and arthralgias.   Skin: Negative for rash.   Neurological: Positive for weakness (right leg).   All other systems reviewed and are negative.        Filed Vitals:    06/09/11 1138  BP: 150/107   Pulse: 64   Temp: 98.1 ??F (36.7 ??C)   Resp: 21   Height: 5\' 11"  (1.803 m)   Weight: 88.451 kg (195 lb)   SpO2: 100%            Physical Exam   Nursing note and vitals reviewed.  Constitutional: He is oriented to person, place, and time. He appears well-developed and well-nourished. No distress.   HENT:   Head: Normocephalic and atraumatic.   Eyes: Conjunctivae and EOM are normal. Pupils are equal, round, and reactive to light.   Neck: Normal range of motion. Neck supple. No JVD present. No tracheal deviation present. No thyromegaly present.   Cardiovascular: Normal rate, regular rhythm and normal heart sounds.  Exam reveals no gallop and no friction rub.    No murmur heard.  Pulmonary/Chest: Effort normal and breath sounds normal. No respiratory distress. He has no wheezes. He has no rales.   Abdominal: Soft. He exhibits no mass. There is no tenderness. There is no rebound and no guarding.   Musculoskeletal: Normal range of motion. He exhibits no edema.   Neurological: He is alert and oriented to person, place, and time.        Right sided facial droop- upper and lower.   4/5 strength in the RUE, 3/5 in the RLE and 5/5 in the LUE.    Skin: Skin is warm and dry.    Psychiatric: He has a normal mood and affect. His behavior is normal.        MDM     Differential Diagnosis; Clinical Impression; Plan:     A:  45yo m with R sided weakness and facial droop concerning for CVA.  Had 2 similar episodes yesterday likely TIA's.    P:  Head ct  Labs  ecg  Asa    Pt outside of window for tPA.      Procedures    CONSULT NOTE:  Domingo Cocking, MD spoke to Dr. Doreene Eland concerning the patient. The patient's history, presentation, physical findings, and results were all discussed.   Note written by Joyce Copa ARMSTRONG, Scribe, as dictated by Domingo Cocking, MD 1:45 PM      CONSULT NOTE:  Domingo Cocking, MD spoke to Dr. Maurene Capes concerning the patient. The patient's history, presentation, physical findings, and results were all discussed. He will see.  Note written by JESSICA L ARMSTRONG, Scribe, as dictated by Domingo Cocking, MD 1:49 PM      ED EKG interpretation:  Rhythm: normal sinus rhythm. Rate (approx.): 60.  Axis: normal.  ST segment:  No concerning ST elevations or depressions. This EKG was interpreted by Domingo Cocking, MD,ED Provider.    Head ct neg    1:49 PM  Patient is being admitted to the hospital.  The results of their tests and reasons for their admission have been discussed with them and/or available family.  They convey agreement and understanding for the need to be admitted and for their admission diagnosis.  Consultation will be made now with the inpatient physician for hospitalization.

## 2011-06-09 NOTE — Progress Notes (Addendum)
Bedside and Verbal shift change report given to Sandria Bales, RN (oncoming nurse) by Nada Libman, RN (offgoing nurse).  Report given with SBAR, Kardex, MAR and Recent Results.     The patient has been without neuro changes since arriving on the floor, testing is complete awaiting MRI results.

## 2011-06-09 NOTE — Progress Notes (Signed)
Spoken with Dr Annye Asa r/t Mri/MRA results

## 2011-06-09 NOTE — Consults (Signed)
NEUROLOGY CONSULT NOTE    Patient ID:  [name]  [id number]  45 y.o.  02-26-1966    Date of Consultation:  June 09, 2011    Referring Physician: Marlene Bast    Reason for Consultation:  Stroke    Subjective:      [name] is a 45 y.o. male for whom I have been asked to consult for an acute neurological event. He developed right leg numbness and weakness yesterday evening.  Before going to bed, he noticed numbness and dyscoordination of his right hand.  Today upon awakening, it was noticed that his face was asymmetric.  He was previously on Plavix but stopped taking it after having multiple appointments with the cardiologist rescheduled.    He had a possible stroke in 2009 at which time he had visual disturbance.  MRI showed atypical white matter disease, and he had a lumbar puncture to evaluate for vasculitis, MS, etc.  This was negative.    Past Medical History   Diagnosis Date   ??? Chronic kidney disease    ??? Hypertension    ??? Anemia    ??? Coronary artery disease    ??? Low back pain    ??? Stroke 06/09/2011   ??? HTN (hypertension) 06/09/2011      Past Surgical History   Procedure Date   ??? Pr acne surgery of skin abscess    ??? Hx orthopaedic      right miniscus repair      Prior to Admission medications    Medication Sig Start Date End Date Taking? Authorizing Provider   hydrALAZINE (APRESOLINE) 50 mg tablet TAKE ONE TABLET BY MOUTH THREE TIMES A DAY 03/20/11  Yes W Cliff Hendrix IV, MD   amLODIPine (NORVASC) 10 mg tablet TAKE TWO TABLETS BY MOUTH EVERY DAY 10/25/10  Yes W Cliff Hendrix IV, MD   metoprolol (LOPRESSOR) 25 mg tablet Take 100 mg by mouth two (2) times a day. Indications: HYPERTENSION   Yes Phys Other, MD     No Known Allergies   History   Substance Use Topics   ??? Smoking status: Current Some Day Smoker -- 0.5 packs/day for 15 years   ??? Smokeless tobacco: Never Used   ??? Alcohol Use: 6.0 oz/week     12 Cans of beer per week        Family history:  + for stroke.    Review of Systems:     As noted in the PMH and HPI. No fever, headache, joint pains.  He has had a rash on his back. All other systems were reviewed and were negative.    Objective:     Patient Vitals for the past 8 hrs:   BP Temp Pulse Resp SpO2 Height Weight   06/09/11 1138 150/107 mmHg 98.1 ??F (36.7 ??C) 64  21  100 % 5\' 11"  (1.803 m) 88.451 kg (195 lb)       NEUROLOGICAL EXAM:    Neurovascular:  Normal heart sounds and regular rhythm.  No carotid bruits.   Appearance:  Well developed, well nourished,  and is in no acute distress.   Mental Status: Alert.  Oriented to time, place and person. Fully attentive.  No aphasia. Full fund of knowledge.  Normal recent and remote memory.   Cranial Nerves:    Fundi poorly visualized. PERRL, EOM's full, no nystagmus, no ptosis. Facial sensation is normal.  Mild right facial weakness.  No dysarthria.  Palate is midline.  Normal sternocleidomastoid  strength. Tongue is midline.   Motor:  Right pronator drift.  Right leg drift.  4+/5 to confrontation right arm and leg.   Reflexes:   Deep tendon reflexes 2+/4 and symmetrical.  Toes downgoing left, upgoing right.   Sensory:   Normal to temperature and vibration.   Gait:  Cannot walk due to acute stroke       Cerebellar:  Right sided intention tremor.  Markedly reduced toe- and finger-tapping on right.           Imaging  CT Head: Report and images were independently reviewed. Chronic WM dz.  I reviewed his MRI from 2009.  Extensive white matter changes which include the corpus callosum.  No contrast given.    Lab Review  Laboratory studies were reviewed as below.    Recent Results (from the past 24 hour(s))   CBC WITH AUTOMATED DIFF    Collection Time    06/09/11  1:40 PM       Component Value Range    WBC 4.2  4.1 - 11.1 K/uL    RBC 3.83 (*) 4.10 - 5.70 M/uL    HGB 11.8 (*) 12.1 - 17.0 g/dL    HCT 16.1 (*) 09.6 - 50.3 %    MCV 93.5  80.0 - 99.0 FL    MCH 30.8  26.0 - 34.0 PG    MCHC 33.0  30.0 - 36.5 g/dL    RDW 04.5  40.9 - 81.1 %     PLATELET 161  150 - 400 K/uL    NEUTROPHILS 54  32 - 75 %    LYMPHOCYTES 31  12 - 49 %    MONOCYTES 11  5 - 13 %    EOSINOPHILS 3  0 - 7 %    BASOPHILS 1  0 - 1 %    ABS. NEUTROPHILS 2.3  1.8 - 8.0 K/UL    ABS. LYMPHOCYTES 1.3  0.8 - 3.5 K/UL    ABS. MONOCYTES 0.4  0.0 - 1.0 K/UL    ABS. EOSINOPHILS 0.1  0.0 - 0.4 K/UL    ABS. BASOPHILS 0.0  0.0 - 0.1 K/UL   PROTHROMBIN TIME    Collection Time    06/09/11  1:40 PM       Component Value Range    INR 1.0  0.9 - 1.1      Prothrombin time 10.6  9.4 - 11.7 sec   PTT    Collection Time    06/09/11  1:40 PM       Component Value Range    aPTT 28.6  24.0 - 31.5 sec    aPTT, therapeutic range      58.0 - 77.0 SECS       I discussed the case with Dr. Marlene Bast.    Office records were reviewed.  Dr. Tiburcio Pea raised concern for vasculitis, MS etc.  CSF normal with no oligoclonal bands.  Multiple missed office appointments are described.      Assessment:   Acute stroke, likely due to small vessel disease.  Demyelinating disease less likely with negative OCB's but not excluded.  Malignant HTN  CAD, not on antiplatelet therapy  Tobacco use  Questionable compliance  Chronic renal failure, apparently due to HTN    Active Problems:   Stroke (06/09/2011)     HTN (hypertension) (06/09/2011)     CKD (chronic kidney disease) (06/09/2011)          Plan:   Restart Plavix  Out of  the time window for tPA, etc.  MRI Brain without contrast given renal failure  MRA Head without contrast  2D Echo   Carotid Dopplers  Fasting lipid profile  PT, OT  DVT prophylaxis  I strongly encouraged him to quit smoking

## 2011-06-09 NOTE — ED Notes (Signed)
Intermittent right-sided weakness since yesterday evening.

## 2011-06-09 NOTE — Other (Signed)
TRANSFER - OUT REPORT:    Verbal report given to christina on Jann Ra Storts  being transferred to [facility] for routine progression of care       Report consisted of patient???s Situation, Background, Assessment and   Recommendations(SBAR).     Information from the following report(s) SBAR, ED Summary and MAR was reviewed with the receiving nurse.    Opportunity for questions and clarification was provided.

## 2011-06-09 NOTE — Progress Notes (Signed)
Echocardiogram completed.

## 2011-06-09 NOTE — ED Notes (Signed)
Assumed care of the patient.  Introduced self to the patient.  Patient is in stretcher, on monitor x3.  Patient states that he is thirsty, but has no other complaints at this time.  Advised patient to wait on food or drink until seen by ED MD.  Patient expressed understanding.  Patient has family at the bedside.

## 2011-06-10 LAB — URINALYSIS W/ REFLEX CULTURE
Bacteria: NEGATIVE /HPF
Bilirubin: NEGATIVE
Blood: NEGATIVE
Glucose: NEGATIVE MG/DL
Ketone: NEGATIVE MG/DL
Nitrites: NEGATIVE
Protein: 30 MG/DL — AB
Specific gravity: 1.011 (ref 1.003–1.030)
Urobilinogen: 0.2 EU/DL (ref 0.2–1.0)
pH (UA): 5.5 (ref 5.0–8.0)

## 2011-06-10 LAB — LIPID PANEL
CHOL/HDL Ratio: 4.2 (ref 0–5.0)
Cholesterol, total: 203 MG/DL — ABNORMAL HIGH (ref <200)
HDL Cholesterol: 48 MG/DL
LDL, calculated: 76.2 MG/DL (ref 0–100)
Triglyceride: 394 MG/DL — ABNORMAL HIGH (ref <150)
VLDL, calculated: 78.8 MG/DL

## 2011-06-10 LAB — METABOLIC PANEL, BASIC
Anion gap: 10 mmol/L (ref 5–15)
BUN/Creatinine ratio: 9 — ABNORMAL LOW (ref 12–20)
BUN: 31 MG/DL — ABNORMAL HIGH (ref 6–20)
CO2: 21 MMOL/L (ref 21–32)
Calcium: 8.2 MG/DL — ABNORMAL LOW (ref 8.5–10.1)
Chloride: 109 MMOL/L — ABNORMAL HIGH (ref 97–108)
Creatinine: 3.48 MG/DL — ABNORMAL HIGH (ref 0.45–1.15)
GFR est AA: 23 mL/min/{1.73_m2} — ABNORMAL LOW (ref >60)
GFR est non-AA: 19 mL/min/{1.73_m2} — ABNORMAL LOW (ref >60)
Glucose: 97 MG/DL (ref 65–100)
Potassium: 3.9 MMOL/L (ref 3.5–5.1)
Sodium: 140 MMOL/L (ref 136–145)

## 2011-06-10 LAB — CK W/ CKMB & INDEX
CK - MB: 0.9 NG/ML (ref 0.5–3.6)
CK-MB Index: 0.4 (ref 0–2.5)
CK: 232 U/L (ref 39–308)

## 2011-06-10 LAB — DRUG SCREEN, URINE
AMPHETAMINES: NEGATIVE
BARBITURATES: NEGATIVE
BENZODIAZEPINES: NEGATIVE
COCAINE: NEGATIVE
METHADONE: NEGATIVE
OPIATES: NEGATIVE
PCP(PHENCYCLIDINE): NEGATIVE
THC (TH-CANNABINOL): POSITIVE — AB

## 2011-06-10 LAB — TROPONIN I: Troponin-I, Qt.: 0.08 ng/mL — ABNORMAL HIGH (ref <0.05)

## 2011-06-10 MED ADMIN — amLODIPine (NORVASC) tablet 10 mg: ORAL | @ 14:00:00 | NDC 59762153006

## 2011-06-10 MED ADMIN — aspirin chewable tablet 81 mg: ORAL | @ 14:00:00 | NDC 63739043401

## 2011-06-10 MED ADMIN — hydrALAZINE (APRESOLINE) tablet 50 mg: ORAL | @ 14:00:00 | NDC 62584073411

## 2011-06-10 MED ADMIN — metoprolol (LOPRESSOR) tablet 100 mg: ORAL | @ 23:00:00 | NDC 62584026611

## 2011-06-10 MED ADMIN — atorvastatin (LIPITOR) tablet 10 mg: ORAL | @ 14:00:00 | NDC 68084056411

## 2011-06-10 MED ADMIN — metoprolol (LOPRESSOR) tablet 100 mg: ORAL | @ 14:00:00 | NDC 51079080101

## 2011-06-10 MED ADMIN — amLODIPine (NORVASC) tablet 10 mg: ORAL | @ 23:00:00 | NDC 68084050511

## 2011-06-10 MED ADMIN — hydrALAZINE (APRESOLINE) tablet 50 mg: ORAL | @ 23:00:00 | NDC 62584073411

## 2011-06-10 MED ADMIN — clopidogrel (PLAVIX) tablet 75 mg: ORAL | @ 14:00:00 | NDC 68084060911

## 2011-06-10 MED ADMIN — hydrALAZINE (APRESOLINE) tablet 50 mg: ORAL | @ 05:00:00 | NDC 62584073411

## 2011-06-10 NOTE — Progress Notes (Signed)
Speech Therapy Note    Consult received and appreciated. Spoke with RN who reports no difficulty with speech or swallowing. PT to see patient prior to SLP and concurred no difficulty with speech noted.     Spoke with patient bedside who presented with fluent, clear and appropriate speech. Oral motor exam WFL. 100% intelligible in conversation.    No formal SLP evaluation needed at this time. Signing off; please reconsult as needed.    Alexander Lovett, MS, CCC-SLP

## 2011-06-10 NOTE — Progress Notes (Signed)
Problem: Mobility Impaired (Adult and Pediatric)  Goal: *Acute Goals and Plan of Care (Insert Text)  Physical Therapy Goals  Initiated 06/10/2011  1. Patient will transfer from bed to chair and chair to bed with independence using the least restrictive device within 7 day(s).  2. Patient will perform sit to stand with independence within 7 day(s).  3. Patient will ambulate with independence for 300 feet with the least restrictive device within 7 day(s).   4. Patient will ascend/descend 15 stairs with use of handrail(s) with supervision/set-up within 7 day(s).  PHYSICAL THERAPY EVALUATION  Patient: [name] (45 y.o. male)  Date: 06/10/2011  Primary Diagnosis: Stroke        Precautions:  Fall      ASSESSMENT :   Based on the objective data described below, the patient presents with decreased R side coordination with noted difficulty in finger to nose, heel to shin and rapid alternating movements. He is also presenting with R side weakness and hypertension. BP waxing and waning with positional movements, however continued to stay in the 130's/110's making it unsafe to perform excess activity today. Assessed short distance ambulation of 10 ft and pt did not have any difficulty or LOB. He reports LOB to the right side over the past 24 hours. Will continue to follow for acute PT needs and assess additional ambulation/stairs as appropriate. Anticipate he will be safe for d/c home with family and pending his progression may benefit from outpatient therapy to maximize his functional independence. Recommend he get up in chair and ambulate with nursing staff this afternoon pending BP status.      Patient will benefit from skilled intervention to address the above impairments.  Patient???s rehabilitation potential is considered to be Good  Factors which may influence rehabilitation potential include:   [X]          None noted  [ ]          Mental ability/status  [ ]          Medical condition   [ ]          Home/family situation and support systems  [ ]          Safety awareness  [ ]          Pain tolerance/management  [ ]          Other:        PLAN :   Recommendations and Planned Interventions:  [ ]            Bed Mobility Training             [ ]     Neuromuscular Re-Education  [ ]            Transfer Training                   [ ]     Orthotic/Prosthetic Training  [X]            Gait Training                         [ ]     Modalities  [X]            Therapeutic Exercises           [ ]     Edema Management/Control  [X]            Therapeutic Activities            [X]     Patient and Family Training/Education  [ ]   Other (comment):    Frequency/Duration: Patient will be followed by physical therapy 5 times a week to address goals.  Discharge Recommendations: Outpatient vs None  Further Equipment Recommendations for Discharge: tbd       SUBJECTIVE:   Patient stated ???I just have noticed the weakness on my right side .???      OBJECTIVE DATA SUMMARY:       Past Medical History   Diagnosis Date   ??? Chronic kidney disease         hypertensive nephrosclerosis   ??? Hypertension     ??? Anemia     ??? Coronary artery disease     ??? Low back pain     ??? Stroke 06/09/2011   ??? HTN (hypertension) 06/09/2011   ??? Secondary hyperparathyroidism     ??? Thrombocytopenia     ??? Accelerated hypertension         Hospitalization 2008 -w/ vision disturbance  and 2010 with rt arm pain      Past Surgical History   Procedure Date   ??? Pr acne surgery of skin abscess     ??? Hx orthopaedic         right miniscus repair   ??? Hx appendectomy       Prior Level of Function/Home Situation: indep, working for [employer]  Home Situation  Home Environment: Private residence  # Steps to Enter: 4   Rails to Enter: Yes  Secondary school teacher : Bilateral  Wheelchair Ramp: No  One/Two Story Residence: Two Engineer, materials Steps: 16   Height of Each Step (in): 5 inches  Interior Rails: Both  Retail buyer Available: No  Living Alone: No  Support Systems: Spouse   Patient Expects to be Discharged to:: Private residence  Current DME Used/Available at Home: None  Critical Behavior:  Neurologic State: Alert  Orientation Level: Oriented X4  Cognition: Appropriate decision making;Appropriate for age attention/concentration;Appropriate safety awareness  Safety/Judgement: Awareness of environment  Skin:  Appears intact  Strength:    Strength: Generally decreased, functional (R side weaker than L ); grip strength weaker on R, RLE 4/5 throughout, LLE 5/5 throughout     Tone & Sensation:   Tone: Normal  Sensation: Intact     Range Of Motion:  AROM: Within functional limits     Coordination:  Coordination: Generally decreased, functional (decreased coordiniation R side, difficulty with finger to nose, heel to shin and rapid alternating movements; noted slower movements on the R with overshooting/undershooting with finger to nose and poor control of RLE with heel to shin)    Functional Mobility:  Bed Mobility:  Supine to Sit: Independent  Sit to Supine: Independent     Transfers:  Sit to Stand: Supervision  Stand to Sit: Supervision         Balance:   Sitting: Intact  Standing: Intact  Ambulation/Gait Training:  Distance (ft): 10 Feet (ft)  Assistive Device: Gait belt  Ambulation - Level of Assistance: Supervision/Set-up   Unsafe to ambulate further distances at this time due to high BP    Pain:  Pain Scale 1: Numeric (0 - 10)  Pain Intensity 1: 0     Activity Tolerance:   Limited by increased BP   Please refer to the flowsheet for vital signs taken during this treatment.  After treatment:   [ ]          Patient left in no apparent distress sitting up in chair  [X]   Patient left in no apparent distress in bed  [X]          Call bell left within reach  [X]          Nursing notified  [ ]          Caregiver present  [ ]          Bed alarm activated      COMMUNICATION/EDUCATION:   The patient???s plan of care was discussed with: Registered Nurse, OT, Speech Therapist.   [X]          Fall prevention education was provided and the patient/caregiver indicated understanding.  [X]          Patient/family have participated as able in goal setting and plan of care.  [X]          Patient/family agree to work toward stated goals and plan of care.  [ ]          Patient understands intent and goals of therapy, but is neutral about his/her participation.  [ ]          Patient is unable to participate in goal setting and plan of care.    Thank you for this referral.  Vonzell Schlatter Vellucci, PT   Time Calculation: 20 mins

## 2011-06-10 NOTE — Progress Notes (Signed)
Pressure Ulcer Documentation  (COMPLETE ONE LABEL PER PRESSURE ULCER)  For further information, please review corresponding Wound Care flowsheet.      [name] has:    No pressure ulcer noted and pressure ulcer prevention initiated.      Location Number:      Stage:     Size (cm):  Length:  Width:  Depth:  Undermining/Tracking:      Tissue (%):  Red:  Pink:  Yellow:  Necrotic:  Maroon/Purple:      Barrie Lyme, RN

## 2011-06-10 NOTE — Progress Notes (Signed)
Bedside and Verbal shift change report given to Lauren, RN (oncoming nurse) by Kristi Norment, RN (offgoing nurse).  Report given with SBAR, Kardex, ED Summary, Intake/Output, MAR and Recent Results.

## 2011-06-10 NOTE — Progress Notes (Signed)
Medical Progress Note      NAME: [name]   DOB:  1966/03/28  MRM:  [id number]    Date/Time: 06/10/2011           Problem List:     Active Problems:   Stroke (06/09/2011)     HTN (hypertension) (06/09/2011)     CKD (chronic kidney disease) (06/09/2011)           Subjective:     Echo: nl; carotids:(prelim) neg. Brain MRA(prelim): patent circle of Willis; Brain MRI: Lt Post BG infarct.  Studies reviewed Cannan Beeck/ pt.   Rt hp about the same. Unclear if sensation disturbed.  Speech about same . Denies CP, SOB,  HA or swallowing trouble.    Past Medical History   Diagnosis Date   ??? Chronic kidney disease      hypertensive nephrosclerosis   ??? Hypertension    ??? Anemia    ??? Coronary artery disease    ??? Low back pain    ??? Stroke 06/09/2011   ??? HTN (hypertension) 06/09/2011   ??? Secondary hyperparathyroidism    ??? Thrombocytopenia    ??? Accelerated hypertension      Hospitalization 2008 -Seria[facility] Brotzman/ vision disturbance  and 2010 with rt arm pain             Objective:         Vitals:      Last 24hrs VS reviewed since prior progress note. Most recent are:    Visit Vitals   Item Reading   ??? BP 129/91   ??? Pulse 62   ??? Temp 98.3 ??F (36.8 ??C)   ??? Resp 14   ??? Ht 5\' 11"  (1.803 m)   ??? Wt 194 lb 3.2 oz (88.089 kg)   ??? BMI 27.09 kg/m2   ??? SpO2 96%     SpO2 Readings from Last 6 Encounters:   06/10/11 96%   05/22/10 100%   07/19/09 99%            Intake/Output Summary (Last 24 hours) at 06/10/11 3086  Last data filed at 06/09/11 1748   Gross per 24 hour   Intake 516.67 ml   Output    525 ml   Net  -8.33 ml                  Exam:      General:  Alert, cooperative, no distress, appears stated age.   Lungs:   Clear to auscultation bilaterally.   Heart:  Regular rate and rhythm, S1, S2 normal : monitor : nsr.   Abdomen:   Soft, non-tender. Bowel sounds normal. .   Extremities: No ankle edema.    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 Yesterday.   O/Leelynd Maldonado Neuro Non focal     Ambulates in the hall slowly Elliet Goodnow/ contact guard.     Lab Data Reviewed: (see below)  Recent Results (from the past 24 hour(s))   EKG, 12 LEAD, INITIAL    Collection Time    06/09/11  1:32 PM       Component Value Range    Ventricular Rate 60      Atrial Rate 60      P-R Interval 166      QRS Duration 88      Q-T Interval 424      QTC Calculation (Bezet) 424      Calculated P  Axis 50      Calculated R Axis 0      Calculated T Axis -15      Diagnosis        Value: ** Poor data quality, interpretation may be adversely affected      Normal sinus rhythm      Voltage criteria for left ventricular hypertrophy      Nonspecific T wave abnormality      When compared with ECG of 19-Jul-2009 05:33,      Nonspecific T wave abnormality has replaced inverted T waves in Lateral leads      Confirmed by Delton See, M.D., [place] [phone]) on 06/09/2011 3:35:43 PM   CBC WITH AUTOMATED DIFF    Collection Time    06/09/11  1:40 PM       Component Value Range    WBC 4.2  4.1 - 11.1 K/uL    RBC 3.83 (*) 4.10 - 5.70 M/uL    HGB 11.8 (*) 12.1 - 17.0 g/dL    HCT 52.8 (*) 41.3 - 50.3 %    MCV 93.5  80.0 - 99.0 FL    MCH 30.8  26.0 - 34.0 PG    MCHC 33.0  30.0 - 36.5 g/dL    RDW 24.4  01.0 - 27.2 %    PLATELET 161  150 - 400 K/uL    NEUTROPHILS 54  32 - 75 %    LYMPHOCYTES 31  12 - 49 %    MONOCYTES 11  5 - 13 %    EOSINOPHILS 3  0 - 7 %    BASOPHILS 1  0 - 1 %    ABS. NEUTROPHILS 2.3  1.8 - 8.0 K/UL    ABS. LYMPHOCYTES 1.3  0.8 - 3.5 K/UL    ABS. MONOCYTES 0.4  0.0 - 1.0 K/UL    ABS. EOSINOPHILS 0.1  0.0 - 0.4 K/UL    ABS. BASOPHILS 0.0  0.0 - 0.1 K/UL   CK Vertie Dibbern/ CKMB & INDEX    Collection Time    06/09/11  1:40 PM       Component Value Range    CK - MB 1.3  0.5 - 3.6 NG/ML    CK-MB Index 0.6  0 - 2.5      CK 231  39 - 308 U/L   METABOLIC PANEL, COMPREHENSIVE    Collection Time    06/09/11  1:40 PM       Component Value Range    Sodium 141  136 - 145 MMOL/L    Potassium 4.2  3.5 - 5.1 MMOL/L    Chloride 112 (*) 97 - 108 MMOL/L     CO2 20 (*) 21 - 32 MMOL/L    Anion gap 9  5 - 15 mmol/L    Glucose 91  65 - 100 MG/DL    BUN 31 (*) 6 - 20 MG/DL    Creatinine 5.36 (*) 0.45 - 1.15 MG/DL    BUN/Creatinine ratio 9 (*) 12 - 20      GFR est AA 23 (*) >60 ml/min/1.21m2    GFR est non-AA 19 (*) >60 ml/min/1.55m2    Calcium 8.5  8.5 - 10.1 MG/DL    Bilirubin, total 0.3  0.2 - 1.0 MG/DL    ALT 17  12 - 78 U/L    AST 18  15 - 37 U/L    Alk. phosphatase 52  50 - 136 U/L    Protein, total 7.5  6.4 -  8.2 g/dL    Albumin 3.6  3.5 - 5.0 g/dL    Globulin 3.9  2.0 - 4.0 g/dL    A-G Ratio 0.9 (*) 1.1 - 2.2     PROTHROMBIN TIME    Collection Time    06/09/11  1:40 PM       Component Value Range    INR 1.0  0.9 - 1.1      Prothrombin time 10.6  9.4 - 11.7 sec   PTT    Collection Time    06/09/11  1:40 PM       Component Value Range    aPTT 28.6  24.0 - 31.5 sec    aPTT, therapeutic range      58.0 - 77.0 SECS   TROPONIN I    Collection Time    06/09/11  1:40 PM       Component Value Range    Troponin-I, Qt. 0.07 (*) <0.05 ng/mL   TSH, 3RD GENERATION    Collection Time    06/09/11  1:40 PM       Component Value Range    TSH 1.05  0.36 - 3.74 uIU/mL   TROPONIN I    Collection Time    06/10/11  4:00 AM       Component Value Range    Troponin-I, Qt. 0.08 (*) <0.05 ng/mL   CK Annalisa Colonna/ CKMB & INDEX    Collection Time    06/10/11  4:00 AM       Component Value Range    CK - MB 0.9  0.5 - 3.6 NG/ML    CK-MB Index 0.4  0 - 2.5      CK 232  39 - 308 U/L   METABOLIC PANEL, BASIC    Collection Time    06/10/11  4:00 AM       Component Value Range    Sodium 140  136 - 145 MMOL/L    Potassium 3.9  3.5 - 5.1 MMOL/L    Chloride 109 (*) 97 - 108 MMOL/L    CO2 21  21 - 32 MMOL/L    Anion gap 10  5 - 15 mmol/L    Glucose 97  65 - 100 MG/DL    BUN 31 (*) 6 - 20 MG/DL    Creatinine 1.61 (*) 0.45 - 1.15 MG/DL    BUN/Creatinine ratio 9 (*) 12 - 20      GFR est AA 23 (*) >60 ml/min/1.52m2    GFR est non-AA 19 (*) >60 ml/min/1.90m2    Calcium 8.2 (*) 8.5 - 10.1 MG/DL   LIPID PANEL     Collection Time    06/10/11  4:00 AM       Component Value Range    LIPID PROFILE          Cholesterol, total 203 (*) <200 MG/DL    Triglyceride 096 (*) <150 MG/DL    HDL Cholesterol 48      LDL, calculated 76.2  0 - 045 MG/DL    VLDL, calculated 40.9      CHOL/HDL Ratio 4.2  0 - 5.0         Medications Reviewed: (see below)    ______________________________________________________________________    Medications:     Current Facility-Administered Medications   Medication Dose Route Frequency   ??? aspirin chewable tablet 81 mg  81 mg Oral DAILY   ??? amLODIPine (NORVASC) tablet 10 mg  10 mg Oral BID   ??? atorvastatin (LIPITOR)  tablet 10 mg  10 mg Oral DAILY   ??? aspirin chewable tablet 325 mg  325 mg Oral NOW   ??? hydrALAZINE (APRESOLINE) tablet 50 mg  50 mg Oral TID   ??? metoprolol (LOPRESSOR) tablet 100 mg  100 mg Oral BID   ??? acetaminophen (TYLENOL) tablet 650 mg  650 mg Oral Q4H PRN   ??? acetaminophen (TYLENOL) tablet 650 mg  650 mg Oral Q4H PRN   ??? clopidogrel (PLAVIX) tablet 75 mg  75 mg Oral DAILY   ??? DISCONTD: 0.9% sodium chloride infusion  125 mL/hr IntraVENous CONTINUOUS   ??? DISCONTD: 0.9% sodium chloride infusion  50 mL/hr IntraVENous CONTINUOUS   ??? DISCONTD: amLODIPine (NORVASC) tablet 10 mg  10 mg Oral DAILY   ??? DISCONTD: 0.9% sodium chloride infusion  50 mL/hr IntraVENous CONTINUOUS   ??? DISCONTD: aspirin chewable tablet 81 mg  81 mg Oral DAILY   ??? DISCONTD: amLODIPine (NORVASC) tablet 20 mg  20 mg Oral DAILY                   Assessment:   CVA  Elevated lipids  HTN under better control   Borderline Trop I Darwyn Ponzo/ nl CPK. Suspect due to CKD  Patient Active Problem List   Diagnoses Code   ??? Accelerated hypertension 401.0   ??? Chronic kidney disease, stage IV (severe) 585.4   ??? Low back pain 724.2   ??? Lumbar spinal stenosis 724.02   ??? Stroke 434.91   ??? HTN (hypertension) 401.9   ??? CKD (chronic kidney disease) 585.9          Plan:      PT, OT, ST. Add Lipitor. Neuro to advise whether he should take both Aspirin and Plavix and how long he should be out from work. Aim for d/c tomorrow if all agree. Spoke Wallie Lagrand/ wife. [phone]  Wandra Scot.                                                        ___________________________________________________      Attending Physician: Ysidro Evert, MD

## 2011-06-10 NOTE — Progress Notes (Signed)
Neurology Progress Note    Patient ID:  [name]  [id number]  45 y.o.  November 11, 1965    Subjective:     Chief complaint: Stroke    HPI:   Patient seen for an acute neurological event. Mild right arm and leg weakness and numbness x 2 days now.  No change in symptoms.  Remains markedly hypertensive.  Was taking neither aspirin nor Plavix at home.    Review of Systems:  As noted in the HPI. No CP, SOB.    Current Facility-Administered Medications   Medication Dose Route Frequency   ??? aspirin chewable tablet 81 mg  81 mg Oral DAILY   ??? amLODIPine (NORVASC) tablet 10 mg  10 mg Oral BID   ??? atorvastatin (LIPITOR) tablet 10 mg  10 mg Oral DAILY   ??? aspirin chewable tablet 325 mg  325 mg Oral NOW   ??? hydrALAZINE (APRESOLINE) tablet 50 mg  50 mg Oral TID   ??? metoprolol (LOPRESSOR) tablet 100 mg  100 mg Oral BID   ??? acetaminophen (TYLENOL) tablet 650 mg  650 mg Oral Q4H PRN   ??? acetaminophen (TYLENOL) tablet 650 mg  650 mg Oral Q4H PRN   ??? clopidogrel (PLAVIX) tablet 75 mg  75 mg Oral DAILY        Past Medical History   Diagnosis Date   ??? Chronic kidney disease      hypertensive nephrosclerosis   ??? Hypertension    ??? Anemia    ??? Coronary artery disease    ??? Low back pain    ??? Stroke 06/09/2011   ??? HTN (hypertension) 06/09/2011   ??? Secondary hyperparathyroidism    ??? Thrombocytopenia    ??? Accelerated hypertension      Hospitalization 2008 -w/ vision disturbance  and 2010 with rt arm pain        History   Substance Use Topics   ??? Smoking status: Current Some Day Smoker -- 0.5 packs/day for 24 years   ??? Smokeless tobacco: Never Used   ??? Alcohol Use: 6.0 oz/week     12 Cans of beer per week           Objective:     Visit Vitals   Item Reading   ??? BP 134/121   ??? Pulse 70   ??? Temp 98.2 ??F (36.8 ??C)   ??? Resp 14   ??? Ht 5\' 11"  (1.803 m)   ??? Wt 88.089 kg (194 lb 3.2 oz)   ??? BMI 27.09 kg/m2   ??? SpO2 100%         NEUROLOGICAL EXAM:    Appearance:  Well developed, well nourished,  and is in no acute distress.    Mental Status: Alert.  Oriented to time, place and person. Fully attentive.  No aphasia. Full fund of knowledge.   Cranial Nerves:   PERRL, EOM's full, no nystagmus, no ptosis. Facial sensation is normal. Mild right facial weakness. Palate is midline.  Normal sternocleidomastoid strength. Tongue is midline.   Motor:  Right pronator drift.  4+/5 right arm.  5/5 elsewhere.     Reflexes:   Deep tendon reflexes 2+/4 and symmetrical.  Toes downgoing left, upgoing right.   Sensory:   Normal to temperature.   Gait:  Slightly wide-based gait.       Cerebellar:  Right sided intention tremor.   Neurovascular:  Normal heart sounds and regular rhythm.  No carotid bruits.  Lab Review   Recent Results (from the past 24 hour(s))   EKG, 12 LEAD, INITIAL    Collection Time    06/09/11  1:32 PM       Component Value Range    Ventricular Rate 60      Atrial Rate 60      P-R Interval 166      QRS Duration 88      Q-T Interval 424      QTC Calculation (Bezet) 424      Calculated P Axis 50      Calculated R Axis 0      Calculated T Axis -15      Diagnosis        Value: ** Poor data quality, interpretation may be adversely affected      Normal sinus rhythm      Voltage criteria for left ventricular hypertrophy      Nonspecific T wave abnormality      When compared with ECG of 19-Jul-2009 05:33,      Nonspecific T wave abnormality has replaced inverted T waves in Lateral leads      Confirmed by Delton See, M.D., [place] [id number]) on 06/09/2011 3:35:43 PM   CBC WITH AUTOMATED DIFF    Collection Time    06/09/11  1:40 PM       Component Value Range    WBC 4.2  4.1 - 11.1 K/uL    RBC 3.83 (*) 4.10 - 5.70 M/uL    HGB 11.8 (*) 12.1 - 17.0 g/dL    HCT 60.4 (*) 54.0 - 50.3 %    MCV 93.5  80.0 - 99.0 FL    MCH 30.8  26.0 - 34.0 PG    MCHC 33.0  30.0 - 36.5 g/dL    RDW 98.1  19.1 - 47.8 %    PLATELET 161  150 - 400 K/uL    NEUTROPHILS 54  32 - 75 %    LYMPHOCYTES 31  12 - 49 %    MONOCYTES 11  5 - 13 %    EOSINOPHILS 3  0 - 7 %     BASOPHILS 1  0 - 1 %    ABS. NEUTROPHILS 2.3  1.8 - 8.0 K/UL    ABS. LYMPHOCYTES 1.3  0.8 - 3.5 K/UL    ABS. MONOCYTES 0.4  0.0 - 1.0 K/UL    ABS. EOSINOPHILS 0.1  0.0 - 0.4 K/UL    ABS. BASOPHILS 0.0  0.0 - 0.1 K/UL   CK W/ CKMB & INDEX    Collection Time    06/09/11  1:40 PM       Component Value Range    CK - MB 1.3  0.5 - 3.6 NG/ML    CK-MB Index 0.6  0 - 2.5      CK 231  39 - 308 U/L   METABOLIC PANEL, COMPREHENSIVE    Collection Time    06/09/11  1:40 PM       Component Value Range    Sodium 141  136 - 145 MMOL/L    Potassium 4.2  3.5 - 5.1 MMOL/L    Chloride 112 (*) 97 - 108 MMOL/L    CO2 20 (*) 21 - 32 MMOL/L    Anion gap 9  5 - 15 mmol/L    Glucose 91  65 - 100 MG/DL    BUN 31 (*) 6 - 20 MG/DL    Creatinine 2.95 (*) 0.45 - 1.15 MG/DL  BUN/Creatinine ratio 9 (*) 12 - 20      GFR est AA 23 (*) >60 ml/min/1.16m2    GFR est non-AA 19 (*) >60 ml/min/1.79m2    Calcium 8.5  8.5 - 10.1 MG/DL    Bilirubin, total 0.3  0.2 - 1.0 MG/DL    ALT 17  12 - 78 U/L    AST 18  15 - 37 U/L    Alk. phosphatase 52  50 - 136 U/L    Protein, total 7.5  6.4 - 8.2 g/dL    Albumin 3.6  3.5 - 5.0 g/dL    Globulin 3.9  2.0 - 4.0 g/dL    A-G Ratio 0.9 (*) 1.1 - 2.2     PROTHROMBIN TIME    Collection Time    06/09/11  1:40 PM       Component Value Range    INR 1.0  0.9 - 1.1      Prothrombin time 10.6  9.4 - 11.7 sec   PTT    Collection Time    06/09/11  1:40 PM       Component Value Range    aPTT 28.6  24.0 - 31.5 sec    aPTT, therapeutic range      58.0 - 77.0 SECS   TROPONIN I    Collection Time    06/09/11  1:40 PM       Component Value Range    Troponin-I, Qt. 0.07 (*) <0.05 ng/mL   TSH, 3RD GENERATION    Collection Time    06/09/11  1:40 PM       Component Value Range    TSH 1.05  0.36 - 3.74 uIU/mL   TROPONIN I    Collection Time    06/10/11  4:00 AM       Component Value Range    Troponin-I, Qt. 0.08 (*) <0.05 ng/mL   CK W/ CKMB & INDEX    Collection Time    06/10/11  4:00 AM       Component Value Range     CK - MB 0.9  0.5 - 3.6 NG/ML    CK-MB Index 0.4  0 - 2.5      CK 232  39 - 308 U/L   METABOLIC PANEL, BASIC    Collection Time    06/10/11  4:00 AM       Component Value Range    Sodium 140  136 - 145 MMOL/L    Potassium 3.9  3.5 - 5.1 MMOL/L    Chloride 109 (*) 97 - 108 MMOL/L    CO2 21  21 - 32 MMOL/L    Anion gap 10  5 - 15 mmol/L    Glucose 97  65 - 100 MG/DL    BUN 31 (*) 6 - 20 MG/DL    Creatinine 0.98 (*) 0.45 - 1.15 MG/DL    BUN/Creatinine ratio 9 (*) 12 - 20      GFR est AA 23 (*) >60 ml/min/1.61m2    GFR est non-AA 19 (*) >60 ml/min/1.69m2    Calcium 8.2 (*) 8.5 - 10.1 MG/DL   LIPID PANEL    Collection Time    06/10/11  4:00 AM       Component Value Range    LIPID PROFILE          Cholesterol, total 203 (*) <200 MG/DL    Triglyceride 119 (*) <150 MG/DL    HDL Cholesterol 48  LDL, calculated 76.2  0 - 540 MG/DL    VLDL, calculated 98.1      CHOL/HDL Ratio 4.2  0 - 5.0         Additional comments:I reviewed the patient's new clinical lab test results.     Images and a report from MRI Brain reviewed.  Acute infarct in posterior limb of left internal capsule.  MRA Head normal.  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  Will d/c ASA  I suspect he will need to be out of work at least 2 weeks, perhaps longer depending on recovery.  His ability to go back to work will depend entirely on when/whether he gets adequate function back in his right hand.  Rehab to evaluate  BP control  Agree with statin  Again emphasized the need to quit smoking      Signed:  Nestor Lewandowsky, MD  06/10/2011  10:05 AM

## 2011-06-10 NOTE — Progress Notes (Signed)
Problem: Ischemic Stroke: 3-24 hours  Goal: *Hemodynamically stable  Outcome: Progressing Towards Goal  Iv fluids in progress

## 2011-06-10 NOTE — Progress Notes (Signed)
Problem: Self Care Deficits Care Plan (Adult)  Goal: *Acute Goals and Plan of Care (Insert Text)  Occupational Therapy Goals  Initiated 06/10/2011  1. Patient will perform opening and closing 10/10 ADL containers with supervision/set-up within 7 day(s).  2. Patient will perform simple home management with modified independence within 7 day(s).  3. Patient will perform peg hole test for further OT assessment with modified independence within 7 day(s).  4. Patient will participate in upper extremity therapeutic exercise/activities with light resistance R UE with supervision within 7 day(s).   OCCUPATIONAL THERAPY EVALUATION  Patient: [name] (45 y.o. male)  Date: 06/10/2011  Primary Diagnosis: Stroke        Precautions:   Fall      ASSESSMENT :   Based on the objective data described below, the patient presents with setup containers, supervision gathering items, fair dynamic standing balance, poor FM coordination R hand, strength 3/5 hand and +3/5 elbow - shoulders, elevated BP. Recommend outpatient OT services to increase independence for home and return to work, safety, strengthening, FM coordination.   Safe to discharge home with wife, wife to drive.     Patient will benefit from skilled intervention to address the above impairments.  Patient???s rehabilitation potential is considered to be Good  Factors which may influence rehabilitation potential include:   [X]              None noted  [ ]              Mental ability/status  [ ]              Medical condition  [ ]              Home/family situation and support systems  [ ]              Safety awareness  [ ]              Pain tolerance/management  [ ]              Other:        PLAN :   Recommendations and Planned Interventions:  [X]                Self Care Training                  [X]         Therapeutic Activities  [X]                Functional Mobility Training    [ ]         Cognitive Retraining   [X]                Therapeutic Exercises           [X]         Endurance Activities  [X]                Balance Training                   [ ]         Neuromuscular Re-Education  [ ]                Visual/Perceptual Training     [X]    Home Safety Training  [X]                Patient Education                 [X]         Family  Training/Education  [ ]                Other (comment):    Frequency/Duration: Patient will be followed by occupational therapy 5 times a week to address goals.  Discharge Recommendations: Outpatient  Further Equipment Recommendations for Discharge: TBD       SUBJECTIVE:   Patient stated ???I will do whatever I need to get better.???      OBJECTIVE DATA SUMMARY:       Past Medical History   Diagnosis Date   ??? Chronic kidney disease         hypertensive nephrosclerosis   ??? Hypertension     ??? Anemia     ??? Coronary artery disease     ??? Low back pain     ??? Stroke 06/09/2011   ??? HTN (hypertension) 06/09/2011   ??? Secondary hyperparathyroidism     ??? Thrombocytopenia     ??? Accelerated hypertension         Hospitalization 2008 -w/ vision disturbance  and 2010 with rt arm pain      Past Surgical History   Procedure Date   ??? Pr acne surgery of skin abscess     ??? Hx orthopaedic         right miniscus repair   ??? 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   Home Situation  Home Environment: Private residence  # Steps to Enter: 4   Rails to Enter: Yes  Hand Rails : Bilateral  Wheelchair Ramp: No  One/Two Story Residence: Two Engineer, materials Steps: 16   Height of Each Step (in): 5 inches  Interior Rails: Both  Retail buyer Available: No  Living Alone: No  Support Systems: Spouse  Patient Expects to be Discharged to:: Private residence  Current DME Used/Available at Home: None  Tub or Shower Type: Tub/Shower combination  [X]   Right hand dominant   [ ]   Left hand dominant  Cognitive/Behavioral Status:  Neurologic State: Alert   Orientation Level: Oriented X4  Cognition: Appropriate decision making;Appropriate for age attention/concentration;Appropriate safety awareness  Safety/Judgement: Awareness of environment  Skin: intact as seen  Edema: intact as seen  Vision/Perceptual:                           Acuity: Within Defined Limits       Coordination:  Coordination: Generally decreased, functional (decreased coordiniation R side, difficulty with finger to no)  Fine Motor Skills-Upper: Right Impaired;Left Intact    Gross Motor Skills-Upper: Right Impaired;Left Intact  Balance:  Sitting: Intact  Standing: Impaired;Without support  Standing - Static: Good  Standing - Dynamic : Fair  Strength:  R shoulder - elbow +3/5  R Wrist - digits 3/5;   L UE 4/5  Strength: Grossly decreased, non-functional (R UE)              Tone & Sensation:    Tone: Normal  Sensation: Intact  Per patient                    Range of Motion:  B UEs  AROM: Within functional limits                       Functional Mobility and Transfers for ADLs:  Transfers:  Sit to Stand: Supervision chair  Toilet Transfer : Supervision/set up              Tub Transfer: Total assistance(dependent) (not safe at this time)                                ADL Assessment:  Feeding: Supervision/set up containers    Oral Facial Hygiene/Grooming: Supervision/set up containers, dropping items, self corrects increased time    Bathing: Total assistance (not assessed at this time, states completed with nursing)    Upper Body Dressing: Supervision/set up (loose shirt pulled over head, did not complete buttons)    Lower Body Dressing: Supervision/set up (loose elastic pants, did not complete zipper or shoe laces)    Toileting: Supervision/set up (states completes with L hand increased time to compensate)              ADL Intervention:         Feeding: instruction to use R hand but can use L hand to aid R hand via stabilizing or hand over hand completing full motion. Can use this technique for all tasks until increased ability R UE.      Dressing: encouraged to continue to use loose clothing does not require FM coordination to increase independence and decrease patient's frustration over having wife assist.                         Cognitive Retraining  Safety/Judgement: Awareness of environment    Therapeutic Exercise:  Patient instructed and provided with handout light resistance putty and theraband exercises to increase neuro re-education R UE in prep for ADLs. Goal 10 reps 3 sets 2x/day.     Pain:  Pain Scale 1: Numeric (0 - 10)  Pain Intensity 1: 0              Activity Tolerance:   VSS  Please refer to the flowsheet for vital signs taken during this treatment.  After treatment:   [X]  Patient left in no apparent distress sitting up in chair  [ ]  Patient left in no apparent distress in bed  [X]  Call bell left within reach  [X]  Nursing notified  [ ]  Caregiver present  [ ]  Bed alarm activated      COMMUNICATION/EDUCATION:   The patient???s plan of care was discussed with: Physical Therapist, Registered Nurse and Social Worker.  [X]  Home safety education was provided and the patient/caregiver indicated understanding.  [X]  Patient/family have participated as able in goal setting and plan of care.  [X]  Patient/family agree to work toward stated goals and plan of care.  [ ]  Patient understands intent and goals of therapy, but is neutral about his/her participation.  [ ]  Patient is unable to participate in goal setting and plan of care.  This patient???s plan of care is appropriate for delegation to OTA.    Thank you for this referral.  JACQUELINE R CHERRY, OT  Time Calculation: 20 mins

## 2011-06-10 NOTE — Progress Notes (Signed)
Bedside and Verbal shift change report given to Laymond Purser (oncoming nurse) by Samara Deist (offgoing nurse).  Report given with SBAR, Intake/Output, MAR and Recent Results. New onset of cva with rt sided weakness orherwise alert and oriented.No pain issues

## 2011-06-10 NOTE — Progress Notes (Signed)
Spiritual Care Assessment/Progress Notes    [name] [id number]  [id number]    [birth year]  45 y.o.  male    Patient Telephone Number: [phone] (home)   Religious Affiliation: [name]   Language: English   Extended Emergency Contact Information  Primary Emergency Contact: [name],[name]  Address: [address]           [place], [place] [address] [place]  Home Phone: [phone]  Mobile Phone: [phone]  Relation: Spouse   Patient Active Problem List   Diagnoses Date Noted   ??? Stroke 06/09/2011   ??? HTN (hypertension) 06/09/2011   ??? CKD (chronic kidney disease) 06/09/2011   ??? Lumbar spinal stenosis 08/27/2010   ??? Low back pain 05/29/2010   ??? Chronic kidney disease, stage IV (severe) 07/19/2009   ??? Accelerated hypertension 07/18/2009        Date: 06/10/2011       Level of Religious/Spiritual Activity:  []          Involved in faith tradition/spiritual practice    []          Not involved in faith tradition/spiritual practice  [x]          Spiritually oriented    []          Claims no spiritual orientation    []          seeking spiritual identity  []          Feels alienated from religious practice/tradition  []          Feels angry about religious practice/tradition  []          Spirituality/religious tradition not a resource for coping at this time.  []          Not able to assess due to medical condition    Services Provided Today:  []          crisis intervention    []          reading Scriptures  [x]          spiritual assessment    [x]          prayer  [x]          empathic listening/emotional support  []          rites and rituals (cite in comments)  []          life review     []          religious support  []          theological development   []          advocacy  []          ethical dialog     []          blessing  []          bereavement support    [x]          support to family  []          anticipatory grief support   []          help with AMD  []          spiritual guidance    []          meditation       Spiritual Care Needs  []          Emotional Support  []          Spiritual/Religious Care  []          Loss/Adjustment  []   Advocacy/Referral /Ethics  [x]          No needs expressed at this time  []          Other: (note in comments)  Spiritual Care Plan  []          Follow up visits with pt/family  []          Provide materials  []          Schedule sacraments  []          Contact [facility]  [x]          Follow up as needed  []          Other: (note in comments)     Comments: Provided an empathic and supportive presence to [name] and his wife; offered prayer and assured them of our availability.    Timmothy Euler, M.Div, Staff Chaplain

## 2011-06-10 NOTE — Progress Notes (Signed)
Pressure Ulcer Documentation  (COMPLETE ONE LABEL PER PRESSURE ULCER)  For further information, please review corresponding Wound Care flowsheet.      Izora Ribas Pfarr has:    No pressure ulcer noted and pressure ulcer prevention initiated.      Location Number:      Stage:         Size (cm):  Length:  Width:  Depth:  Undermining/Tracking:      Tissue (%):  Red:  Pink:  Yellow:  Necrotic:  Maroon/Purple:      Barrie Lyme, RN

## 2011-06-11 LAB — CK W/ CKMB & INDEX
CK - MB: 0.9 NG/ML (ref 0.5–3.6)
CK-MB Index: 0.3 (ref 0–2.5)
CK: 281 U/L (ref 39–308)

## 2011-06-11 LAB — CULTURE, URINE
Colonies Counted: <1000
Colony Count: <1000
Culture result:: NO GROWTH
Culture: NO GROWTH

## 2011-06-11 MED ADMIN — atorvastatin (LIPITOR) tablet 10 mg: ORAL | @ 14:00:00 | NDC 68084056411

## 2011-06-11 MED ADMIN — acetaminophen (TYLENOL) tablet 650 mg: ORAL | @ 18:00:00 | NDC 50580050130

## 2011-06-11 MED ADMIN — amLODIPine (NORVASC) tablet 10 mg: ORAL | @ 14:00:00 | NDC 68084050511

## 2011-06-11 MED ADMIN — hydrALAZINE (APRESOLINE) tablet 75 mg: ORAL | @ 23:00:00 | NDC 62584073411

## 2011-06-11 MED ADMIN — clopidogrel (PLAVIX) tablet 75 mg: ORAL | @ 14:00:00 | NDC 68084060911

## 2011-06-11 MED ADMIN — hydrALAZINE (APRESOLINE) tablet 50 mg: ORAL | @ 14:00:00 | NDC 62584073411

## 2011-06-11 MED ADMIN — acetaminophen (TYLENOL) tablet 650 mg: ORAL | @ 23:00:00 | NDC 50580050130

## 2011-06-11 MED ADMIN — hydrALAZINE (APRESOLINE) tablet 50 mg: ORAL | @ 01:00:00 | NDC 62584073411

## 2011-06-11 MED ADMIN — labetalol (NORMODYNE) tablet 100 mg: ORAL | @ 14:00:00 | NDC 00185001001

## 2011-06-11 MED ADMIN — amLODIPine (NORVASC) tablet 10 mg: ORAL | @ 23:00:00 | NDC 00904599261

## 2011-06-11 MED ADMIN — sodium chloride (NS) 0.9 % flush: @ 17:00:00 | NDC 82903065462

## 2011-06-11 NOTE — Progress Notes (Signed)
Neurology Progress Note    Patient ID:  [name]  [id number]  45 y.o.  1965-12-26    Subjective:     Chief complaint: Stroke    HPI: Patient complains of increased difficulty lifting his right arm with pain at the shoulder.  He was doing a lot of exercises with his right arm yesterday.    Review of Systems:  As noted in the HPI.      Current Facility-Administered Medications   Medication Dose Route Frequency   ??? labetalol (NORMODYNE) tablet 100 mg  100 mg Oral Q12H   ??? amLODIPine (NORVASC) tablet 10 mg  10 mg Oral BID   ??? atorvastatin (LIPITOR) tablet 10 mg  10 mg Oral DAILY   ??? hydrALAZINE (APRESOLINE) tablet 50 mg  50 mg Oral TID   ??? acetaminophen (TYLENOL) tablet 650 mg  650 mg Oral Q4H PRN   ??? acetaminophen (TYLENOL) tablet 650 mg  650 mg Oral Q4H PRN   ??? clopidogrel (PLAVIX) tablet 75 mg  75 mg Oral DAILY            Objective:     Visit Vitals   Item Reading   ??? BP 143/108   ??? Pulse 67   ??? Temp 98.6 ??F (37 ??C)   ??? Resp 13   ??? Ht 5\' 11"  (1.803 m)   ??? Wt 88.1 kg (194 lb 3.6 oz)   ??? BMI 27.09 kg/m2   ??? SpO2 97%            11/12 1900 - 11/14 0659  In: 1080 [P.O.:1080]  Out: -     Exam:  Alert. Fully oriented. Attentive. No aphasia.  Tearful at times.  PERRL. EOMI. Face symmetric. Tongue midline.  Mild right pronator drift.  He has pain in the shoulder with passive ROM.  Decreased finger and toe tapping right.  Minimal intention tremor right.     Lab Review   Recent Results (from the past 24 hour(s))   URINALYSIS W/ REFLEX CULTURE    Collection Time    06/10/11 10:30 AM       Component Value Range    Color YELLOW      Appearance CLEAR      Specific gravity 1.011  1.003 - 1.030      pH 5.5  5.0 - 8.0      Protein 30 (*) NEGATIVE MG/DL    Glucose NEGATIVE   NEGATIVE MG/DL    Ketone NEGATIVE   NEGATIVE MG/DL    Bilirubin NEGATIVE   NEGATIVE    Blood NEGATIVE   NEGATIVE    Urobilinogen 0.2  0.2 - 1.0 EU/DL    Nitrites NEGATIVE   NEGATIVE    Leukocyte Esterase TRACE (*) NEGATIVE     UA:UC IF INDICATED URINE CULTURE ORDERED      WBC 5-10  0 - 4 /HPF    RBC 0-3  0 - 5 /HPF    Epithelial cells 0-5  0 - 5 /LPF    Bacteria NEGATIVE   NEGATIVE /HPF    Hyaline Cast 2-5  0 - 2   DRUG SCREEN, URINE    Collection Time    06/10/11 10:30 AM       Component Value Range    AMPHETAMINE NEGATIVE   NEGATIVE    BARBITURATES NEGATIVE   NEGATIVE    BENZODIAZEPINE NEGATIVE   NEGATIVE    COCAINE NEGATIVE   NEGATIVE    METHADONE NEGATIVE   NEGATIVE  OPIATES NEGATIVE   NEGATIVE    PCP(PHENCYCLIDINE) NEGATIVE   NEGATIVE    THC (TH-CANNABINOL) POSITIVE (*) NEGATIVE    DRUG SCRN COMMENT (NOTE)         Additional comments:I reviewed the patient's new clinical lab test results.           Assessment:   Left lacunar infarct involving posterior limb of the internal capsule.  His increased difficulty today appears to be related to shoulder pain rather than worsening of his stroke symptoms.    Active Problems:   Stroke (06/09/2011)     HTN (hypertension) (06/09/2011)     CKD (chronic kidney disease) (06/09/2011)      Plan:   Continue Plavix, statin  Outpatient PT/OT  We talked again about how he is going to quit smoking  His stroke has been stable.  I think he can go home anytime but would defer to PCP with regard to whether he is stable for discharge from a BP standpoint as this still seems to be fluctuating.      Signed:  Nestor Lewandowsky, MD  06/11/2011  9:30 AM

## 2011-06-11 NOTE — Procedures (Signed)
[facility]  *** FINAL REPORT ***    Name: [name], [name]  MRN: [id number]  DOB: [birth year]  Visit: [id number]  Date: 09 Jun 2011    TYPE OF TEST: Cerebrovascular Duplex    REASON FOR TEST  Cerebrovascular accident    Right Carotid:-             Proximal               Mid                 Distal  cm/s  Systolic  Diastolic  Systolic  Diastolic  Systolic  Diastolic  CCA:     16.6                                      47.0  Bulb:  ECA:     57.0  ICA:     31.0                                      31.0  ICA/CCA:  0.7    ICA Stenosis: Unknown    Right Vertebral:-  Finding:  Sys:       16.0  Dia:    Right Subclavian:    Left Carotid:-            Proximal                Mid                 Distal  cm/s  Systolic  Diastolic  Systolic  Diastolic  Systolic  Diastolic  CCA:     06.3                                      52.0  Bulb:  ECA:     44.0  ICA:     33.0                                      31.0  ICA/CCA:  0.6    ICA Stenosis: Unknown    Left Vertebral:-  Finding: Antegrade  Sys:       14.0  Dia:    Left Subclavian:    INTERPRETATION/FINDINGS  PROCEDURE:  Evaluation of the extracranial cerebrovascular arteries  with ultrasound (B-mode imaging, pulsed Doppler, color Doppler).  Includes the common carotid, internal carotid, external carotid, and  vertebral arteries.    FINDINGS:         Right: Internal carotid velocity is within normal limits.  Color  Doppler demonstrates no narrowing of the flow channel.  B-mode imaging   demonstrates no plaque.         Left:   Internal carotid velocity is within normal limits.  Color   Doppler demonstrates no narrowing of the flow channel.  B-mode  imaging demonstrates no plaque.    IMPRESSION:  Consistent with no stenosis of the right internal carotid   and no stenosis of the left internal carotid.  Vertebrals are patent  with antegrade flow.    ADDITIONAL COMMENTS    I have personally reviewed the data relevant  to the interpretation of  this  study.     TECHNOLOGIST:  Alcario Drought, RVT, RDMS    PHYSICIAN:  Electronically signed by:  Mady Haagensen, MD  06/11/2011 06:24 PM

## 2011-06-11 NOTE — Progress Notes (Signed)
Medical Progress Note      NAME: [name]   DOB:  28-Sep-1965  MRM:  [id number]    Date/Time: 06/11/2011           Problem List:     Active Problems:   Stroke (06/09/2011)     HTN (hypertension) (06/09/2011)     CKD (chronic kidney disease) (06/09/2011)           Subjective:     He thinks he overused his rt arm yesterday and now c/o rt shoulder pain on abduction that improves as he moves rt arm near his ear.   Rt 5th finger adduction weakness yesterday persists today.  Hearing ok today.     Past Medical History   Diagnosis Date   ??? Chronic kidney disease      hypertensive nephrosclerosis   ??? Hypertension    ??? Anemia    ??? Coronary artery disease    ??? Low back pain    ??? Stroke 06/09/2011   ??? HTN (hypertension) 06/09/2011   ??? Secondary hyperparathyroidism    ??? Thrombocytopenia    ??? Accelerated hypertension      Hospitalization 2008 -[facility]/ vision disturbance  and 2010 with rt arm pain             Objective:         Vitals:      Last 24hrs VS reviewed since prior progress note. Most recent are:    Visit Vitals   Item Reading   ??? BP 143/108   ??? Pulse 67   ??? Temp 98.6 ??F (37 ??C)   ??? Resp 13   ??? Ht 5\' 11"  (1.803 m)   ??? Wt 194 lb 3.6 oz (88.1 kg)   ??? BMI 27.09 kg/m2   ??? SpO2 97%     SpO2 Readings from Last 6 Encounters:   06/11/11 97%   05/22/10 100%   07/19/09 99%          Intake/Output Summary (Last 24 hours) at 06/11/11 0749  Last data filed at 06/10/11 2028   Gross per 24 hour   Intake   1080 ml   Output      0 ml   Net   1080 ml                  Exam:      General:  Alert, cooperative, no distress, appears stated age.    Lungs:   Clear to auscultation bilaterally.   Heart:  Regular rate and rhythm, S1, S2 normal.   Abdomen:   Soft, non-tender. Bowel sounds normal.   Extremities: No ankle  Edema.    Rt shoulder nt; neg impingement sign. Mild restriction shoulder rom . No shoulder crepitance noted    Neuro:  Rt arm Moderate HP stable. Rt 5th finger Addcution weakness.   Rt leg mild HP stable.    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   Stable    O/Alexzandra Bilton Nonfocal neuro exam     Lab Data Reviewed: (see below)  Recent Results (from the past 24 hour(s))   URINALYSIS Ida Uppal/ REFLEX CULTURE    Collection Time    06/10/11 10:30 AM       Component Value Range    Color YELLOW      Appearance CLEAR      Specific gravity 1.011  1.003 - 1.030      pH 5.5  5.0 - 8.0      Protein 30 (*)  NEGATIVE MG/DL    Glucose NEGATIVE   NEGATIVE MG/DL    Ketone NEGATIVE   NEGATIVE MG/DL    Bilirubin NEGATIVE   NEGATIVE    Blood NEGATIVE   NEGATIVE    Urobilinogen 0.2  0.2 - 1.0 EU/DL    Nitrites NEGATIVE   NEGATIVE    Leukocyte Esterase TRACE (*) NEGATIVE    UA:UC IF INDICATED URINE CULTURE ORDERED      WBC 5-10  0 - 4 /HPF    RBC 0-3  0 - 5 /HPF    Epithelial cells 0-5  0 - 5 /LPF    Bacteria NEGATIVE   NEGATIVE /HPF    Hyaline Cast 2-5  0 - 2   DRUG SCREEN, URINE    Collection Time    06/10/11 10:30 AM       Component Value Range    AMPHETAMINE NEGATIVE   NEGATIVE    BARBITURATES NEGATIVE   NEGATIVE    BENZODIAZEPINE NEGATIVE   NEGATIVE    COCAINE NEGATIVE   NEGATIVE    METHADONE NEGATIVE   NEGATIVE    OPIATES NEGATIVE   NEGATIVE    PCP(PHENCYCLIDINE) NEGATIVE   NEGATIVE    THC (TH-CANNABINOL) POSITIVE (*) NEGATIVE    DRUG SCRN COMMENT (NOTE)         Medications Reviewed: (see below)    ______________________________________________________________________    Medications:     Current Facility-Administered Medications   Medication Dose Route Frequency   ??? labetalol (NORMODYNE) tablet 100 mg  100 mg Oral Q12H   ??? amLODIPine (NORVASC) tablet 10 mg  10 mg Oral BID   ??? atorvastatin (LIPITOR) tablet 10 mg  10 mg Oral DAILY   ??? DISCONTD: aspirin chewable tablet 81 mg  81 mg Oral DAILY   ??? hydrALAZINE (APRESOLINE) tablet 50 mg  50 mg Oral TID   ??? acetaminophen (TYLENOL) tablet 650 mg  650 mg Oral Q4H PRN   ??? acetaminophen (TYLENOL) tablet 650 mg  650 mg Oral Q4H PRN   ??? clopidogrel (PLAVIX) tablet 75 mg  75 mg Oral DAILY    ??? DISCONTD: metoprolol (LOPRESSOR) tablet 100 mg  100 mg Oral BID                   Assessment:   Rt arm overuse. Ortho to see. Shoulder xray pending  nephro to see re: difficult HTN. 143/108 today. Metoprolol changed to Labetalol  CVA. Neuro to see in f/u.   Spoke Sujata Maines/ wife.   Patient Active Problem List   Diagnoses Code   ??? Accelerated hypertension 401.0   ??? Chronic kidney disease, stage IV (severe) 585.4   ??? Low back pain 724.2   ??? Lumbar spinal stenosis 724.02   ??? Stroke 434.91   ??? HTN (hypertension) 401.9   ??? CKD (chronic kidney disease) 585.9          Plan:     Specialists to advise on d/c timing . May need to stay another day to monitor bp and progress.                                                        ___________________________________________________      Attending Physician: Ysidro Evert, MD

## 2011-06-11 NOTE — Progress Notes (Signed)
Patient received and left in bed. Patient completed all ADLs this am with wife and nursing supervision, compensating with increased time L hand, pain R shoulder s/p too many exercises. Patient reminded to slowly increase reps and sets, when fatigue is not felt the next day then can add more reps; to use R hand first then use L hand to support activity and guide in order to increase independence and use R UE. Patient stating no further questions at this time. Patient with home exercise plan for R UE strengthening shoulder - digits GM to FM. Patient highly motivated to increase independence. Recommend outpatient OT services to progress therapy and speed for return to work. Patient is safe to discharge home with wife assistance, not safe to drive or return back to work yet. Discharging further skilled acute OT services at this time. JACQUELINE R CHERRY, OT  Total time 8 mins

## 2011-06-11 NOTE — Consults (Signed)
CKD/ESRD Consult    Subjective:     Patient is a African American who was admitted to the hospital due to left lacunar infarct.  I was asked to see the patient regarding bp management.  HIs stroke symptoms began on Sunday while at [employer]; however, he did not go to the hospital until 11/12.  He has been non compliant with office follow ups.  He states he was waiting for his new insurance to start since he switched from [employer] to [employer]  He admits to not taking the hydralazine mid day.     Patient Active Problem List   Diagnoses Date Noted   ??? Stroke 06/09/2011   ??? HTN (hypertension) 06/09/2011   ??? CKD (chronic kidney disease) 06/09/2011   ??? Lumbar spinal stenosis 08/27/2010   ??? Low back pain 05/29/2010   ??? Chronic kidney disease, stage IV (severe) 07/19/2009   ??? Accelerated hypertension 07/18/2009     Past Medical History   Diagnosis Date   ??? Chronic kidney disease      hypertensive nephrosclerosis   ??? Hypertension    ??? Anemia    ??? Coronary artery disease    ??? Low back pain    ??? Stroke 06/09/2011   ??? HTN (hypertension) 06/09/2011   ??? Secondary hyperparathyroidism    ??? Thrombocytopenia    ??? Accelerated hypertension      Hospitalization 2008 -w/ vision disturbance  and 2010 with rt arm pain       Past Surgical History   Procedure Date   ??? Pr acne surgery of skin abscess    ??? Hx orthopaedic      right miniscus repair   ??? Hx appendectomy       No Known Allergies   History   Substance Use Topics   ??? Smoking status: Current Some Day Smoker -- 0.5 packs/day for 24 years   ??? Smokeless tobacco: Never Used   ??? Alcohol Use: 6.0 oz/week     12  Cans of beer per week      Family History   Problem Relation Age of Onset   ??? Cancer Father      lung   ??? Cancer Paternal Uncle    ??? Cancer Maternal Grandfather    ??? Cancer Paternal Grandfather    ??? Diabetes Mother    ??? Cancer Mother      HOME MEDICINE  Prior to Admission medications    Medication Sig Start Date End Date Taking? Authorizing Provider    hydrALAZINE (APRESOLINE) 50 mg tablet TAKE ONE TABLET BY MOUTH THREE TIMES A DAY 03/20/11  Yes W Cliff Hendrix IV, MD   amLODIPine (NORVASC) 10 mg tablet TAKE TWO TABLETS BY MOUTH EVERY DAY 10/25/10  Yes W Cliff Hendrix IV, MD   metoprolol (LOPRESSOR) 25 mg tablet Take 100 mg by mouth two (2) times a day. Indications: HYPERTENSION   Yes Phys Other, MD     INPATIENT MEDICINES  Current Facility-Administered Medications   Medication Dose Route Frequency   ??? labetalol (NORMODYNE) tablet 100 mg  100 mg Oral Q12H   ??? amLODIPine (NORVASC) tablet 10 mg  10 mg Oral BID   ??? atorvastatin (LIPITOR) tablet 10 mg  10 mg Oral DAILY   ??? hydrALAZINE (APRESOLINE) tablet 50 mg  50 mg Oral TID   ??? acetaminophen (TYLENOL) tablet 650 mg  650 mg Oral Q4H PRN   ??? acetaminophen (TYLENOL) tablet 650 mg  650 mg Oral Q4H PRN   ???  clopidogrel (PLAVIX) tablet 75 mg  75 mg Oral DAILY        Review of Systems  Right hand weakness, trouble with word finding, no cp/sob/f/c/utis    Objective:     Patient Vitals for the past 8 hrs:   BP Temp Pulse Resp SpO2   06/11/11 0741 143/108 mmHg 98.6 ??F (37 ??C) 67  13  97 %   06/11/11 0506 - - - - 96 %   06/11/11 0414 134/84 mmHg 98.2 ??F (36.8 ??C) 59  16  96 %      Temp (24hrs), Avg:98.4 ??F (36.9 ??C), Min:98.1 ??F (36.7 ??C), Max:98.8 ??F (37.1 ??C)    11/12 1900 - 11/14 0659  In: 1080 [P.O.:1080]  Out: -   11/14 0700 - 11/14 1859  In: 240 [P.O.:240]  Out: -     PHYSICAL EXAM:  General   well developed, well nourished, appears stated age, in no acute distress  EENT  Normocephalic, Atraumatic, PERRLA, EOMI, sclera clear, nares clear, pharynx normal  Respiratory   Clear To Auscultation bilaterally - no wheezes, rales, rhonchi, or crackles  Cardiology  RRR  Abdominal  Soft, non-tender, non-distended, positive bowel sounds, no hepatosplenomegaly  Extremities  No edema  Neurological  Facial droop on right, right hand weakness  Psychological  Oriented x 3.  Normal affect.   ________________________________________________________________________  Dialysis access: none ,yet  ________________________________________________________________________  Data Review CBC: Lab Results   Component Value Date/Time    WBC 4.2 06/09/2011  1:40 PM    RBC 3.83 06/09/2011  1:40 PM    HGB 11.8 06/09/2011  1:40 PM    HCT 35.8 06/09/2011  1:40 PM    PLATELET 161 06/09/2011  1:40 PM   , BMP: Lab Results   Component Value Date/Time    Glucose 97 06/10/2011  4:00 AM    Sodium 140 06/10/2011  4:00 AM    Potassium 3.9 06/10/2011  4:00 AM    Chloride 109 06/10/2011  4:00 AM    CO2 21 06/10/2011  4:00 AM    BUN 31 06/10/2011  4:00 AM    Creatinine 3.48 06/10/2011  4:00 AM    Calcium 8.2 06/10/2011  4:00 AM   , PHOS:   No results found for this basename: PHOS   , Coagulation: Lab Results   Component Value Date/Time    Prothrombin time 10.6 06/09/2011  1:40 PM    INR 1.0 06/09/2011  1:40 PM   , Cardiac markers: Lab Results   Component Value Date/Time    CK 232 06/10/2011  4:00 AM       Assessment:   Active Problems:   Stroke (06/09/2011)     HTN (hypertension) (06/09/2011)     CKD (chronic kidney disease) (06/09/2011)      Plan:   1. He is having trouble taking the hydralazine tid.  WIll adjust the dose to twice a day  2. I really don't like amlodipine at 10 bid; however, he has been on this dose since March (px by one of my partners); he seems to be tolerating it so I will leave for now  3. Our office records had him on metoprolol, not labetalol-will titrate labetalol up.  4. Consider clonidine,cardura, etc.  NOte, pt was on bumex-not now.  May need to resume as outpatient.  5. Renal function poor but stable    Gary Fleet, MD

## 2011-06-11 NOTE — Progress Notes (Signed)
Bedside and Verbal shift change report given to Jamison Neighbor RN  (oncoming nurse) by Matilde Haymaker RN  (offgoing nurse).  Report given with SBAR, Kardex, Procedure Summary, Intake/Output, MAR and Recent Results.

## 2011-06-11 NOTE — Other (Signed)
NEURO Stroke INTERDISCIPLINARY ROUNDS      Patient Information:    Name:   [name]    Age:   45 y.o.    Admission Date:   06/09/2011    Expected Discharge Date:    Possibly today or 11/ 15/ 12    Attending Provider:   Jill Side, MD    Consultant:   Neurology, Nephrology and Orthopedics    Problem List:     Patient Active Problem List   Diagnoses Code   ??? Accelerated hypertension 401.0   ??? Chronic kidney disease, stage IV (severe) 585.4   ??? Low back pain 724.2   ??? Lumbar spinal stenosis 724.02   ??? Stroke 434.91   ??? HTN (hypertension) 401.9   ??? CKD (chronic kidney disease) 585.9       Principal Problem:  <principal problem not specified> Right sided weakness    Procedure:   MRI showed acute ischemic changes to left basal ganglia    Lab Results   Component Value Date/Time    Cholesterol, total 203 06/10/2011  4:00 AM    HDL Cholesterol 48 06/10/2011  4:00 AM    LDL, calculated 76.2 06/10/2011  4:00 AM    Triglyceride 394 06/10/2011  4:00 AM    CHOL/HDL Ratio 4.2 06/10/2011  1:61 AM        BSV Stroke Education Binder and Stroke Education provided to patient and the following topics were discussed:    Patients personal risk factors for stroke are hypertension, smoking and Other smoking marijuana    [name] is taking the following medicine(s) Plavix 75mg ,       Patient is receiving the following therapy intervention(s): PT and OT       During rounds the following quality care indicators and evidence based practices were addressed :     Pain Management and Nutritional Status     During rounds the following patient satisfaction measures were addressed: nurse responsiveness, room cleanliness, pain management and medication education    Discharge Management:     Home    Concerns for the day:  Needs encouragement and information about smoking cessation, pt would like to talk to MD about Chantix.  Patient educated on BP management and new medications.     Interdisciplinary team rounds were held on 06/11/11 with the following team members: Care Management and Nursing and the patient and spouse. Plan of care discussed. See clinical pathway and/or care plan for interventions and desired outcomes.

## 2011-06-11 NOTE — Consults (Signed)
Name:       [name], [name]       Admitted:          06/09/2011                                         DOB:               June 25, 1966  Account #:  [id number]               Age:               45  Consultant: Mardi Mainland, M.D.   Location           [id number]                                CONSULTATION REPORT    DATE OF CONSULTATION           06/11/2011      Thank you for asking me to see this patient for evaluation of his right  shoulder.    HISTORY OF PRESENT ILLNESS:  The patient was admitted for CVA.  He has  weakness on his right side.  He has been doing physical therapy modalities  with a lot of active forward flexion exercises and has developed some  anterior right shoulder pain.  He has not had prior surgery on his  shoulder.  He has not had any prior injuries to the right shoulder.  He  does not have symptoms of instability.  He does not have symptoms of  cervical radiculitis.    PHYSICAL EXAMINATION:  On examination the patient is alert and oriented x3.  He exhibits some mild weakness in his right arm.  He can actively elevate  the right shoulder about 125 degrees.  With elevation he notes he has  anterior subacromial tenderness and mildly positive impingement test.  No  evidence of instability, no evidence of cervical radiculitis, no evidence  of cellulitis or infection.    X-RAY:  I reviewed x-ray of the right shoulder.  He does have a type 2  acromion, but no other abnormalities were noted.    IMPRESSION:  I fear that the patient's discomfort is primarily muscular in  nature with his recent therapy modalities.  He may have a component of  impingement.    PLAN:  We discussed the use of ice and head and avoiding repetitive  overhead activities.  If not better in a week or two we mentioned the  possibility of steroid injection.    Thank you for asking me to see this patient.  I will be glad to see him in  a week or two if not better.              Mardi Mainland, M.D.     cc:   Mardi Mainland, M.D.        Britt Boozer. Doreene Eland, M.D.      LWG/wmx; D: 06/11/2011 11:30 A; T: 06/11/2011 12:06 P; DOC# [id number]; Job#  [id number]

## 2011-06-11 NOTE — Progress Notes (Addendum)
physical Therapy TREATMENT/DISCHARGE  Patient: [name] (45 y.o. male)  Date: 06/11/2011  Diagnosis: Stroke <principal problem not specified>       Precautions: Fall    ASSESSMENT:  Pt progressing with all goals met.  Pt completed stairs for home needs and also performed a TUG with avg score of 7.5 seconds indicating low-no fall risk.  Also educated pt on neuroplasticity and importance of "training" his body for return to normal function.  Pt informed to request OPPT consult if he feels his LE coordination and proprioception are not back within a month or so.  Pt safe for return home with no skilled services at this time.  Pt will require OPOT consult for RUE training when home.  Progression toward goals:  [x]       Improving appropriately and progressing toward goals  []       Improving slowly and progressing toward goals  []       Not making progress toward goals and plan of care will be adjusted     PLAN:  Patient will be discharged from physical therapy at this time.  Rationale for discharge:  [x]  Goals Achieved  []  Plateau Reached  []  Patient not participating in therapy  []  Other:  Discharge Recommendations:  None  Further Equipment Recommendations for Discharge:  none     SUBJECTIVE:   Patient stated ???Do you think I need a cane????  TUG completed and pt found to not require DME.  Educated pt on going slowly and playing with children in sitting position for safety until more balance returns if concerned.  Pt agreeable.  OBJECTIVE DATA SUMMARY:   Critical Behavior:  Neurologic State: Alert  Orientation Level: Oriented X4  Cognition: Follows commands  Safety/Judgement: Awareness of environment  Functional Mobility Training:  Transfers:  Sit to Stand: Supervision  Stand to Sit: Supervision  Balance:  Sitting: Intact  Standing: Intact;Without support  Standing - Static: Good  Standing - Dynamic : Poor  Ambulation/Gait Training:  Distance (ft): 140 Feet (ft)  Assistive Device: Gait belt   Ambulation - Level of Assistance: SBA  Gait Abnormalities: Steppage gait (slight steppage gait on RLE)  Swing Pattern: Right asymmetrical   Get-up and Go (sec): 7.5       Stairs:  Number of Stairs Trained: 16   Stairs - Level of Assistance: Contact guard assistance   Rail Use: Both  Pain:  Pain Scale 1: Numeric (0 - 10)  Pain Intensity 1: 0  Activity Tolerance:   Good; pt reported some RLE fatigue following stairs and walking; pt educated on coordination therex such as alternating toe taps, heel-to-shin, and thumb to finger touches  Please refer to the flowsheet for vital signs taken during this treatment.  After treatment:   [x]  Patient left in no apparent distress sitting up in chair with orthopedist  []  Patient left in no apparent distress in bed  []  Call bell left within reach  []  Nursing notified  [x]  Caregiver present  []  Bed alarm activated    COMMUNICATION/COLLABORATION:   The patient???s plan of care was discussed with: Registered Nurse    Gust Rung, PT   Time Calculation: 23 mins

## 2011-06-11 NOTE — Progress Notes (Signed)
VSS. Still having decreased fine motor and grips to right hand.     Bedside and Verbal shift change report given to [facility] (oncoming nurse) by Elvera Maria (offgoing nurse).  Report given with SBAR and Kardex.

## 2011-06-11 NOTE — Procedures (Signed)
[facility]  *** FINAL REPORT ***    Name: [name], [name]  MRN: [id number]  DOB: 21-Aug-1965  Visit: [id number]  Date: 09 Jun 2011    TYPE OF TEST: Cerebrovascular Duplex    REASON FOR TEST  Cerebrovascular accident    Right Carotid:-             Proximal               Mid                 Distal  cm/s  Systolic  Diastolic  Systolic  Diastolic  Systolic  Diastolic  CCA:     29.5                                      47.0  Bulb:  ECA:     57.0  ICA:     31.0                                      31.0  ICA/CCA:  0.7    ICA Stenosis: Unknown    Right Vertebral:-  Finding:  Sys:       16.0  Dia:    Right Subclavian:    Left Carotid:-            Proximal                Mid                 Distal  cm/s  Systolic  Diastolic  Systolic  Diastolic  Systolic  Diastolic  CCA:     28.4                                      52.0  Bulb:  ECA:     44.0  ICA:     33.0                                      31.0  ICA/CCA:  0.6    ICA Stenosis: Unknown    Left Vertebral:-  Finding: Antegrade  Sys:       14.0  Dia:    Left Subclavian:    INTERPRETATION/FINDINGS  PROCEDURE:  Evaluation of the extracranial cerebrovascular arteries  with ultrasound (B-mode imaging, pulsed Doppler, color Doppler).  Includes the common carotid, internal carotid, external carotid, and  vertebral arteries.    FINDINGS:         Right: Internal carotid velocity is within normal limits.  Color  Doppler demonstrates no narrowing of the flow channel.  B-mode imaging   demonstrates no plaque.         Left:   Internal carotid velocity is within normal limits.  Color   Doppler demonstrates no narrowing of the flow channel.  B-mode  imaging demonstrates no plaque.    IMPRESSION:  Consistent with no stenosis of the right internal carotid   and no stenosis of the left internal carotid.  Vertebrals are patent  with antegrade flow.    ADDITIONAL COMMENTS    I have personally reviewed the data relevant  to the interpretation  of  this  study.    TECHNOLOGIST:  Alcario Drought, RVT, RDMS    PHYSICIAN:  Electronically signed by:  Mady Haagensen, MD  06/11/2011 06:24 PM

## 2011-06-12 MED ORDER — HYDRALAZINE 25 MG TAB
25 mg | ORAL_TABLET | Freq: Two times a day (BID) | ORAL | Status: AC
Start: 2011-06-12 — End: ?

## 2011-06-12 MED ORDER — AMLODIPINE 10 MG TAB
10 mg | ORAL_TABLET | Freq: Two times a day (BID) | ORAL | Status: DC
Start: 2011-06-12 — End: 2013-05-21

## 2011-06-12 MED ORDER — ATORVASTATIN 10 MG TAB
10 mg | ORAL_TABLET | Freq: Every day | ORAL | Status: DC
Start: 2011-06-12 — End: 2011-12-07

## 2011-06-12 MED ORDER — ACETAMINOPHEN 650 MG TABLET
650 mg | ORAL_TABLET | ORAL | Status: AC | PRN
Start: 2011-06-12 — End: ?

## 2011-06-12 MED ORDER — LABETALOL 200 MG TAB
200 mg | ORAL_TABLET | Freq: Two times a day (BID) | ORAL | Status: DC
Start: 2011-06-12 — End: 2012-11-11

## 2011-06-12 MED ORDER — CLOPIDOGREL 75 MG TAB
75 mg | ORAL_TABLET | Freq: Every day | ORAL | Status: DC
Start: 2011-06-12 — End: 2012-11-11

## 2011-06-12 MED ADMIN — hydrALAZINE (APRESOLINE) tablet 75 mg: ORAL | @ 16:00:00 | NDC 62584073411

## 2011-06-12 MED ADMIN — clopidogrel (PLAVIX) tablet 75 mg: ORAL | @ 16:00:00 | NDC 68084060911

## 2011-06-12 MED ADMIN — amLODIPine (NORVASC) tablet 10 mg: ORAL | @ 16:00:00 | NDC 68084050511

## 2011-06-12 MED ADMIN — labetalol (NORMODYNE) tablet 200 mg: ORAL | @ 16:00:00 | NDC 68084045611

## 2011-06-12 MED ADMIN — atorvastatin (LIPITOR) tablet 10 mg: ORAL | @ 16:00:00 | NDC 68084056411

## 2011-06-12 MED ADMIN — labetalol (NORMODYNE) tablet 200 mg: ORAL | @ 01:00:00 | NDC 68084045611

## 2011-06-12 NOTE — Progress Notes (Signed)
Renal-OK with discharge. Pt seen. meds reviewed. He will follow up with me upon discharge after Thanksgiving.  Gary Fleet, MD

## 2011-06-12 NOTE — Progress Notes (Signed)
Stable for d/c . instr reviewed [name]/ pt and wife(phone) . D/c summ dictated.

## 2011-06-12 NOTE — Progress Notes (Incomplete)
VSS. No c/o pain.    Bedside and Verbal shift change report given to Lauren May,RN (oncoming nurse) by Elvera Maria (offgoing nurse).  Report given with SBAR and Kardex.

## 2011-06-13 NOTE — Discharge Summary (Signed)
Name:       [name], [name]             Admitted:    06/09/2011                                               Discharged:  06/12/2011  Account #:  [id number]                     DOB:         1966-01-05  Consultant: Britt Boozer. Doreene Eland, M.D.          Age          45                                 DISCHARGE SUMMARY      DISCHARGE DIAGNOSES:  1. Acute cerebrovascular accident.  2. Severe hypertension.  3. Chronic kidney disease.  4. Hypertensive nephrosclerosis.  5. Medical noncompliance.  6. Hyperlipidemia.    DISCHARGE INSTRUCTIONS:  1. Amlodipine 10 mg b.i.d.  2. Tylenol 650 mg q.4h. p.r.n. pain or fever.  3. Lipitor 10 mg daily.  4. Plavix 75 mg daily.  5. Hydralazine 75 mg b.i.d.  6. Labetalol 200 mg b.i.d.  7. Discontinue metoprolol.  8. Outpatient PT, OT.  9. Call for worsening neurologic symptoms.  10. Tylenol or heating pad p.r.n. shoulder pain.  11. Contact Dr. Doreene Eland for up in 2 weeks' time or p.r.n. sooner.  12. No driving, sexual intercourse for now.    CHIEF COMPLAINT: A 45 year old [place] American male admitted with CVA.    HISTORY OF PRESENT ILLNESS: He has severe hypertension, chronic kidney  disease stage IV, medical noncompliance, previous episodes of accelerated  hypertension. On the day prior to admission, he developed waxing and waning  right hemiparesis with associated numbness, right facial droop, and speech  disturbance. He elected to come to the hospital on the day of admission,  where head CT was negative. He was admitted with CVA.    PAST MEDICAL HISTORY: Severe hypertension, chronic kidney disease stage IV  secondary to hypertensive nephrosclerosis, followed by Dr. Madalyn Rob group,  anemia. Accelerated hypertension episodes:  in 2008, with visual  obscuration ;in 2010, with right arm discomfort, and negative stress  Cardiolite. He had a prior abnormal stress Cardiolite, but never had a  cardiac catheterization due to chronic kidney disease. He has had low back   pain, secondary hyperparathyroidism, thrombocytopenia.    PAST SURGICAL HISTORY: Skin abscess removed from the chest, right knee  meniscus injury repair, appendectomy.    ALLERGIES: NO KNOWN DRUG ALLERGIES.    MEDICATIONS:  1. Metoprolol 100 mg b.i.d.  2. Amlodipine 10 mg b.i.d.  3. Hydralazine 50 mg t.i.d. He admits that he has difficulty remembering  the midday dose of hydralazine.    FAMILY HISTORY: Mother cancer, diabetes. Father cancer. Maternal  grandfather and paternal grandfather cancer.    SOCIAL HISTORY: Sunday smoker. ETOH social. Some cannabis use. Denies  cocaine or other illicit substance use.    REVIEW OF SYSTEMS: Denies headache, fever, dizziness, palpitations,  dyspnea, chest pain, abdominal pain, hematochezia, urinary difficulties.    PHYSICAL EXAMINATION  VITAL SIGNS: 98.1, 64, 150/107, 100% room air saturation.  HEENT: Slight right facial droop, otherwise negative.  NECK:  No carotid bruits heard.  LUNGS: Clear.  HEART: Regular without murmur, gallop, or rub.  ABDOMEN: Negative.  EXTREMITIES: No pedal edema.  NEUROLOGIC: Alert and oriented x3. Slight expressive aphasia. Moderate  right arm hemiparesis and mild right leg hemiparesis. Decreased light touch  sensation in the right thigh. Remainder of neuro exam nonfocal.    EKG: NSR, probable LVH. Creatinine 3.6, BUN 31, LFTs normal, CO2 20. WBC  4.3, hemoglobin 11.8. Portable chest x-ray: No acute disease. Head CT:  Negative. Carotid Doppler: Negative.    HOSPITAL COURSE: The patient was admitted to neuro-telemetry, seen by Jethro Bolus, MD, neurologist. He was placed on Plavix 75 mg daily. His blood  pressure medicine was adjusted with the help of consulting nephrologist,  Dr. Ignacia Bayley. His blood pressure came under good control. He ruled out  for MI by serial cardiac enzymes and EKG. UA, 30 protein, otherwise  negative. TSH normal. Serum creatinine remained stable. Total cholesterol   203, triglycerides 394, HDL 48, LDL 76. Urine drug screen positive for THC,  otherwise negative. The patient was advised again to stop smoking all  substances. A detailed explanation of stroke risk factors were discussed  with the patient. 2-D echocardiogram, EF 65%, no regional wall motion  abnormalities, essentially normal echocardiogram, with no thrombus seen.  Carotid Doppler negative. Brain MRI, a small area of acute ischemic change  in the basal ganglia on the left and areas of white matter abnormality in  both hemispheres, consistent with chronic ischemic change from small vessel  disease. Brain MRA normal. He strained his right shoulder during   exercises with physical therapy. Right shoulder x-ray was negative. Dr.  Glendon Axe, consulting orthopedist, felt like he had right shoulder mild  muscular injury. The shoulder pain improved with rest. He was comfortable  by the day of discharge. His neurologic deficits remained stable.    He was discharged home in stable condition, to have followup as above. His  family was apprised of his progress.               Britt Boozer. Doreene Eland, M.D.    cc:   Mardi Mainland, M.D.        Britt Boozer. Doreene Eland, M.D.        Trudy L. Mora Appl, M.D.        Nestor Lewandowsky, MD      WCH/wmx; D: 06/12/2011  7:59 A; T: 06/13/2011  6:16 A; DOC# [id number]; Job#  [id number]

## 2011-06-27 NOTE — Patient Instructions (Addendum)
MyChart Activation    Thank you for requesting access to MyChart. Please follow the instructions below to securely access and download your online medical record. MyChart allows you to send messages to your doctor, view your test results, renew your prescriptions, schedule appointments, and more.    How Do I Sign Up?    1. In your internet browser, go to [email or web address]  2. Click on the First Time User? Click Here link in the Sign In box. You will be redirect to the New Member Sign Up page.  3. Enter your MyChart Access Code exactly as it appears below. You will not need to use this code after you???ve completed the sign-up process. If you do not sign up before the expiration date, you must request a new code.    MyChart Access Code: [email or web address]  Expires: 09/10/2011  8:26 AM (This is the date your MyChart access code will expire)    4. Enter the last four digits of your Social Security Number (xxxx) and Date of Birth (mm/dd/yyyy) as indicated and click Submit. You will be taken to the next sign-up page.  5. Create a MyChart ID. This will be your MyChart login ID and cannot be changed, so think of one that is secure and easy to remember.  6. Create a MyChart password. You can change your password at any time.  7. Enter your Password Reset Question and Answer. This can be used at a later time if you forget your password.   8. Enter your e-mail address. You will receive e-mail notification when new information is available in MyChart.  9. Click Sign Up. You can now view and download portions of your medical record.  10. Click the Download Summary menu link to download a portable copy of your medical information.    Additional Information    If you have questions, please visit the Frequently Asked Questions section of the MyChart website at [email or web address] Remember, MyChart is NOT to be used for urgent needs. For medical emergencies, dial 911.      Stay off smoking   No work for now .   Return visit 08/01/11  Do therapy.   No driving till you return.

## 2011-06-27 NOTE — Progress Notes (Signed)
[name] is a 45 y.o. male     SUBJECTIVE:    S/p CVA. 06/08/11 .  Mild improvement in mild expressive aphasia.  1 hour before next bp med due. , dbp home is in 90's . Yesterday marked headache . dbp 90's -105 . [name] Minor rt temporal headache today and mild probable cervical sprain -post cerv  Positional pain   Smoked only 1 cigarette since d/c from hospital , he says.   Rt hemiparesis and numbness  is about the same . PT to start today.   On Labetalol. Extremities  feel warmer.   Since CVA, reports rt sided swelling.     Past Medical History   Diagnosis Date   ??? Chronic kidney disease      hypertensive nephrosclerosis   ??? Hypertension    ??? Anemia    ??? Coronary artery disease    ??? Low back pain    ??? Stroke 06/09/2011   ??? HTN (hypertension) 06/09/2011   ??? Secondary hyperparathyroidism    ??? Thrombocytopenia    ??? Accelerated hypertension      Hospitalization 2008 -[name]/ vision disturbance  and 2010 with rt arm pain      Current Outpatient Prescriptions   Medication Sig Dispense Refill   ??? amLODIPine (NORVASC) 10 mg tablet Take 1 Tab by mouth two (2) times a day.  60 Tab  11   ??? hydrALAZINE (APRESOLINE) 25 mg tablet Take 3 Tabs by mouth two (2) times a day.  180 Tab  11   ??? acetaminophen 650 mg Tab Take 650 mg by mouth every four (4) hours as needed for Fever (for fever greater than 100.4 F).  30 Tab  11   ??? atorvastatin (LIPITOR) 10 mg tablet Take 1 Tab by mouth daily.  30 Tab  11   ??? clopidogrel (PLAVIX) 75 mg tablet Take 1 Tab by mouth daily.  30 Tab  11   ??? labetalol (NORMODYNE) 200 mg tablet Take 1 Tab by mouth every twelve (12) hours.  60 Tab  12     No Known Allergies  .  History   Smoking status   ??? Current Some Day Smoker -- 0.5 packs/day for 24 years   Smokeless tobacco   ??? Never Used       Review of Systems  Review of Systems - negative except as per HPI      Physical Examination:  BP 139/82   Ht 5\' 11"  (1.803 m)   Wt 201 lb (91.173 kg)   BMI 28.03 kg/m2  Physical Exam     General: Appears in no acute distress  Neck : mild decreased ROM.   Lung-clear  Heart - reg  Abdomen -  Extremities -no hand edema or rt ankle edema.  Rt mild hemiparesis  and decreased light sensation .   Mild  expressive aphasia    .     Assessment/Plan  1. Stroke    2. HTN (hypertension)      CVA deficits stable. P: PT/OT /ST. Disability form completed.   Stop smoking altogether.   HTn controlled. Same rx. Stop home monitoring - home monitor may not be accurate  Cervical sprain. P: heat, tylenol prn   Told him again no driving an auto for now. Wife here, agrees.   Follow up 4 wks or prn     Author:  Ysidro Evert, MD 11:35 AM12/08/2010

## 2011-08-01 NOTE — Progress Notes (Signed)
[name] is a 46 y.o. male     SUBJECTIVE:    Patient  C/o  Fatigues easily when he writes. He reports current  typing speed is 21-26 words/minute vs 70 /minute at baseline pre-stroke. He reports  Writing now  for 30 minutes at a time vs several hours typing at baseline. 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 He  now sleeps for  4 hours after an OT session . He is taking an on line course for typing training. He says  OT recommends another 1 month of training.     Expressive aphasia. Is improved but not resolved. Has episodic crying during speaking- possible Pseudobulbar effect from the CVA    Rt hand strength is improved.   Rt leg strength improved.     Rt post cervical pain with typing , despite creams, ibuprofen , aleve, or heat pads  Rt hand spasm at times.     Past Medical History   Diagnosis Date   ??? Chronic kidney disease      hypertensive nephrosclerosis   ??? Hypertension    ??? Anemia    ??? Coronary artery disease    ??? Low back pain    ??? Stroke 06/09/2011   ??? HTN (hypertension) 06/09/2011   ??? Secondary hyperparathyroidism    ??? Thrombocytopenia    ??? Accelerated hypertension      Hospitalization 2008 -Froylan Hobby/ vision disturbance  and 2010 with rt arm pain      Current Outpatient Prescriptions   Medication Sig Dispense Refill   ??? amLODIPine (NORVASC) 10 mg tablet Take 1 Tab by mouth two (2) times a day.  60 Tab  11   ??? hydrALAZINE (APRESOLINE) 25 mg tablet Take 3 Tabs by mouth two (2) times a day.  180 Tab  11   ??? acetaminophen 650 mg Tab Take 650 mg by mouth every four (4) hours as needed for Fever (for fever greater than 100.4 F).  30 Tab  11   ??? atorvastatin (LIPITOR) 10 mg tablet Take 1 Tab by mouth daily.  30 Tab  11   ??? clopidogrel (PLAVIX) 75 mg tablet Take 1 Tab by mouth daily.  30 Tab  11   ??? labetalol (NORMODYNE) 200 mg tablet Take 1 Tab by mouth every twelve (12) hours.  60 Tab  12     No Known Allergies  .  History   Smoking status    ??? Current Some Day Smoker -- 0.5 packs/day for 24 years   Smokeless tobacco   ??? Never Used       Review of Systems  Review of Systems - negative except as per HPI      Physical Examination:  BP 120/70   Wt 205 lb (92.987 kg)  Physical Exam    General: Appears in no acute distress  Neck extension  Reproduces  posterior cervical  pain   Lung-clear  Heart - reg  Abdomen -  Extremities - b /l trace  ankle edema .   Rt mild hp stable  Finger abduction rt better.   Minimal expressive aphasia -better.      .        Assessment/Plan  1. Stroke  METABOLIC PANEL, COMPREHENSIVE, CBC WITH AUTOMATED DIFF, PR COLLECTION VENOUS BLOOD,VENIPUNCTURE   2. HTN (hypertension)  METABOLIC PANEL, COMPREHENSIVE, CBC WITH AUTOMATED DIFF, PR COLLECTION VENOUS BLOOD,VENIPUNCTURE   3. CKD (chronic kidney disease), stage  IV  METABOLIC PANEL, COMPREHENSIVE, CBC WITH AUTOMATED DIFF, PR COLLECTION VENOUS BLOOD,VENIPUNCTURE   4. Encounter for long-term (current) use of other medications  METABOLIC PANEL, COMPREHENSIVE, CBC WITH AUTOMATED DIFF, PR COLLECTION VENOUS BLOOD,VENIPUNCTURE   5. Cervical sprain         CVA. Slowly improving [name]/ therapy. Continue   No work till at least 09/29/11. Neurology f/u to be arranged.   Stop smoking altogether advised.  Cervical sprain. Advised heat, stretches, rest. Tylenol ; rx written Percocet prn   CKD. Avoid NSAIDS. Labs pending.     Follow up next month here or  PRN.    Author:  Ysidro Evert, MD 10:58 AM1/10/2011

## 2011-08-01 NOTE — Patient Instructions (Addendum)
MyChart Activation    Thank you for requesting access to MyChart. Please follow the instructions below to securely access and download your online medical record. MyChart allows you to send messages to your doctor, view your test results, renew your prescriptions, schedule appointments, and more.    How Do I Sign Up?    1. In your internet browser, go to [email or web address]  2. Click on the First Time User? Click Here link in the Sign In box. You will be redirect to the New Member Sign Up page.  3. Enter your MyChart Access Code exactly as it appears below. You will not need to use this code after you???ve completed the sign-up process. If you do not sign up before the expiration date, you must request a new code.    MyChart Access Code: [email or web address]  Expires: 09/10/2011  8:26 AM (This is the date your MyChart access code will expire)    4. Enter the last four digits of your Social Security Number (xxxx) and Date of Birth (mm/dd/yyyy) as indicated and click Submit. You will be taken to the next sign-up page.  5. Create a MyChart ID. This will be your MyChart login ID and cannot be changed, so think of one that is secure and easy to remember.  6. Create a MyChart password. You can change your password at any time.  7. Enter your Password Reset Question and Answer. This can be used at a later time if you forget your password.   8. Enter your e-mail address. You will receive e-mail notification when new information is available in MyChart.  9. Click Sign Up. You can now view and download portions of your medical record.  10. Click the Download Summary menu link to download a portable copy of your medical information.    Additional Information    If you have questions, please visit the Frequently Asked Questions section of the MyChart website at [email or web address] Remember, MyChart is NOT to be used for urgent needs. For medical emergencies, dial 911.      No work till 09/29/11    Return here in week of 09/15/11  No smoking.   Neck exercises.   Use heat patches or sports cream as needed.   No aleve or ibuprofen.  Percocet as needed for severe pain   Continue therapy

## 2011-08-02 LAB — CBC WITH AUTOMATED DIFF
ABS. BASOPHILS: 0 10*3/uL (ref 0.0–0.2)
ABS. EOSINOPHILS: 0.2 10*3/uL (ref 0.0–0.4)
ABS. IMM. GRANS.: 0 10*3/uL (ref 0.0–0.1)
ABS. MONOCYTES: 0.6 10*3/uL (ref 0.1–1.0)
ABS. NEUTROPHILS: 2.7 10*3/uL (ref 1.8–7.8)
Abs Lymphocytes: 1.1 10*3/uL (ref 0.7–4.5)
BASOPHILS: 1 % (ref 0–3)
EOSINOPHILS: 4 % (ref 0–7)
HCT: 33.4 % — ABNORMAL LOW (ref 37.5–51.0)
HGB: 11.3 g/dL — ABNORMAL LOW (ref 12.6–17.7)
IMMATURE GRANULOCYTES: 0 % (ref 0–2)
Lymphocytes: 25 % (ref 14–46)
MCH: 31.2 pg (ref 26.6–33.0)
MCHC: 33.8 g/dL (ref 31.5–35.7)
MCV: 92 fL (ref 79–97)
MONOCYTES: 12 % (ref 4–13)
NEUTROPHILS: 58 % (ref 40–74)
PLATELET: 191 10*3/uL (ref 140–415)
RBC: 3.62 x10E6/uL — ABNORMAL LOW (ref 4.14–5.80)
RDW: 14.2 % (ref 12.3–15.4)
WBC: 4.6 10*3/uL (ref 4.0–10.5)

## 2011-08-02 LAB — METABOLIC PANEL, COMPREHENSIVE
A-G Ratio: 1.4 (ref 1.1–2.5)
ALT (SGPT): 132 IU/L — ABNORMAL HIGH (ref 0–44)
AST (SGOT): 25 IU/L (ref 0–40)
Albumin: 4.5 g/dL (ref 3.5–5.5)
Alk. phosphatase: 51 IU/L (ref 25–150)
BUN/Creatinine ratio: 7 — ABNORMAL LOW (ref 9–20)
BUN: 32 mg/dL — ABNORMAL HIGH (ref 6–24)
Bilirubin, total: 0.2 mg/dL (ref 0.0–1.2)
CO2: 21 mmol/L (ref 20–32)
Calcium: 8.9 mg/dL (ref 8.7–10.2)
Chloride: 105 mmol/L (ref 97–108)
Creatinine: 4.27 mg/dL — ABNORMAL HIGH (ref 0.76–1.27)
GFR est non-AA: 16 mL/min/{1.73_m2} — ABNORMAL LOW (ref >59)
GLOBULIN, TOTAL: 3.2 g/dL (ref 1.5–4.5)
Glucose: 101 mg/dL — ABNORMAL HIGH (ref 65–99)
Potassium: 4.3 mmol/L (ref 3.5–5.2)
Protein, total: 7.7 g/dL (ref 6.0–8.5)
Sodium: 139 mmol/L (ref 134–144)
eGFR If African American: 18 mL/min/{1.73_m2} — ABNORMAL LOW (ref >59)

## 2011-08-02 NOTE — Progress Notes (Signed)
Quick Note:    D/[name] pt results. D/c Lipitor and D/c ETOH. Limit Acetaminophen use.   He wants to see a different Nephrology group  ______

## 2011-08-03 ENCOUNTER — Telehealth

## 2011-08-03 NOTE — Telephone Encounter (Signed)
Spoke [name]/ pt. On 08/02/11

## 2011-08-06 NOTE — Telephone Encounter (Signed)
See Neurology c/s requenst. 08/03/11

## 2011-09-11 LAB — METABOLIC PANEL, COMPREHENSIVE
A-G Ratio: 0.9 — ABNORMAL LOW (ref 1.1–2.2)
ALT (SGPT): 44 U/L (ref 12–78)
AST (SGOT): 53 U/L — ABNORMAL HIGH (ref 15–37)
Albumin: 3.7 g/dL (ref 3.5–5.0)
Alk. phosphatase: 41 U/L — ABNORMAL LOW (ref 50–136)
Anion gap: 11 mmol/L (ref 5–15)
BUN/Creatinine ratio: 9 — ABNORMAL LOW (ref 12–20)
BUN: 31 MG/DL — ABNORMAL HIGH (ref 6–20)
Bilirubin, total: 0.4 MG/DL (ref 0.2–1.0)
CO2: 18 MMOL/L — ABNORMAL LOW (ref 21–32)
Calcium: 8.3 MG/DL — ABNORMAL LOW (ref 8.5–10.1)
Chloride: 110 MMOL/L — ABNORMAL HIGH (ref 97–108)
Creatinine: 3.56 MG/DL — ABNORMAL HIGH (ref 0.45–1.15)
GFR est AA: 23 mL/min/{1.73_m2} — ABNORMAL LOW (ref >60)
GFR est non-AA: 19 mL/min/{1.73_m2} — ABNORMAL LOW (ref >60)
Globulin: 4.2 g/dL — ABNORMAL HIGH (ref 2.0–4.0)
Glucose: 123 MG/DL — ABNORMAL HIGH (ref 65–100)
Potassium: 5.1 MMOL/L (ref 3.5–5.1)
Protein, total: 7.9 g/dL (ref 6.4–8.2)
Sodium: 139 MMOL/L (ref 136–145)

## 2011-09-11 LAB — URINALYSIS W/ REFLEX CULTURE
Bacteria: NEGATIVE /HPF
Bilirubin: NEGATIVE
Blood: NEGATIVE
Glucose: NEGATIVE MG/DL
Ketone: NEGATIVE MG/DL
Leukocyte Esterase: NEGATIVE
Nitrites: NEGATIVE
Protein: 30 MG/DL — AB
Specific gravity: 1.011 (ref 1.003–1.030)
Urobilinogen: 0.2 EU/DL (ref 0.2–1.0)
pH (UA): 5.5 (ref 5.0–8.0)

## 2011-09-11 LAB — CBC WITH AUTOMATED DIFF
ABS. BASOPHILS: 0 10*3/uL (ref 0.0–0.1)
ABS. EOSINOPHILS: 0.1 10*3/uL (ref 0.0–0.4)
ABS. LYMPHOCYTES: 1.1 10*3/uL (ref 0.8–3.5)
ABS. MONOCYTES: 0.7 10*3/uL (ref 0.0–1.0)
ABS. NEUTROPHILS: 3.5 10*3/uL (ref 1.8–8.0)
BASOPHILS: 1 % (ref 0–1)
EOSINOPHILS: 3 % (ref 0–7)
HCT: 33.8 % — ABNORMAL LOW (ref 36.6–50.3)
HGB: 11.1 g/dL — ABNORMAL LOW (ref 12.1–17.0)
LYMPHOCYTES: 19 % (ref 12–49)
MCH: 30.5 PG (ref 26.0–34.0)
MCHC: 32.8 g/dL (ref 30.0–36.5)
MCV: 92.9 FL (ref 80.0–99.0)
MONOCYTES: 12 % (ref 5–13)
NEUTROPHILS: 65 % (ref 32–75)
PLATELET: 171 10*3/uL (ref 150–400)
RBC: 3.64 M/uL — ABNORMAL LOW (ref 4.10–5.70)
RDW: 13.6 % (ref 11.5–14.5)
WBC: 5.4 10*3/uL (ref 4.1–11.1)

## 2011-09-11 LAB — TROPONIN I: Troponin-I, Qt.: 0.06 ng/mL — ABNORMAL HIGH (ref <0.05)

## 2011-09-11 MED ADMIN — sodium chloride 0.9 % bolus infusion 1,000 mL: INTRAVENOUS | @ 19:00:00 | NDC 00409798309

## 2011-09-11 MED ADMIN — ketorolac (TORADOL) injection 30 mg: INTRAVENOUS | @ 19:00:00 | NDC 00409379501

## 2011-09-11 MED FILL — KETOROLAC TROMETHAMINE 30 MG/ML INJECTION: 30 mg/mL (1 mL) | INTRAMUSCULAR | Qty: 1

## 2011-09-11 MED FILL — SODIUM CHLORIDE 0.9 % IV: INTRAVENOUS | Qty: 1000

## 2011-09-11 NOTE — ED Notes (Signed)
Report given to Rachel Stargell RN who will assume care of pt at this time.

## 2011-09-11 NOTE — ED Notes (Signed)
Patient complains of "lightheadedness".  Patient states he has history of CVA.  Patient states he is unable to hold left hand still.  +nausea  Denies blurred vision.  Difficulty ambulating.

## 2011-09-11 NOTE — ED Provider Notes (Signed)
HPI Comments: 46 y.o.male with significant hx of hypertension, coronary artery disease, stroke (05/2011), CKD, secondary hyperparathyroidism, and thrombocytopenia who presents to the ED secondary to dizziness. Pt complains of dizziness and generalized weakness onset this morning. Pt also states, "my ears feel like they're going to pop." Pt reports starting this morning "my left hand is jumping and ticking, which is what my right hand does from my stroke." Pt also reports left leg cramping. New headache to the mid-frontal region onset this morning. Pt was seen by his neurologist's NP last week and started on Baclofen which he was taking twice daily and then increased to 3 x daily yesterday. Pt denies fever, chills, coughing, or other acute complaints.    Social hx: Current some day smoker, drinks alcohol.    Note written by Malvin Johns. Ho, Neurosurgeon, as dictated by Jorje Guild, MD 1:21 PM          The history is provided by the patient.        Past Medical History   Diagnosis Date   ??? Chronic kidney disease      hypertensive nephrosclerosis   ??? Hypertension    ??? Anemia    ??? Coronary artery disease    ??? Low back pain    ??? Stroke 06/09/2011   ??? HTN (hypertension) 06/09/2011   ??? Secondary hyperparathyroidism    ??? Thrombocytopenia    ??? Accelerated hypertension      Hospitalization 2008 -w/ vision disturbance  and 2010 with rt arm pain         Past Surgical History   Procedure Date   ??? Pr acne surgery of skin abscess    ??? Hx orthopaedic      right miniscus repair   ??? Hx appendectomy          Family History   Problem Relation Age of Onset   ??? Cancer Father      lung   ??? Cancer Paternal Uncle    ??? Cancer Maternal Grandfather    ??? Cancer Paternal Grandfather    ??? Diabetes Mother    ??? Cancer Mother         History     Social History   ??? Marital Status: MARRIED     Spouse Name: N/A     Number of Children: N/A   ??? Years of Education: N/A     Occupational History   ??? Not on file.     Social History Main Topics   ??? Smoking  status: Current Some Day Smoker -- 0.5 packs/day for 24 years   ??? Smokeless tobacco: Never Used   ??? Alcohol Use: 6.0 oz/week     12 Cans of beer per week   ??? Drug Use: Yes     Special: Marijuana      every other day   ??? Sexually Active: Yes -- Male partner(s)     Other Topics Concern   ??? Not on file     Social History Narrative   ??? No narrative on file                  ALLERGIES: Review of patient's allergies indicates no known allergies.      Review of Systems   Constitutional: Negative for fever and chills.   Respiratory: Negative for cough.    Neurological: Positive for dizziness, weakness and headaches.   All other systems reviewed and are negative.    Note written by Malvin Johns.  Ho, Neurosurgeon, as dictated by Jorje Guild, MD 1:26 PM        Filed Vitals:    09/11/11 1157 09/11/11 1238 09/11/11 1241   BP: 139/88 139/89    Pulse: 78  80   Temp: 97.8 ??F (36.6 ??C)     Resp: 18  15   SpO2: 98%  99%            Physical Exam   Nursing note and vitals reviewed.  Constitutional: He is oriented to person, place, and time. He appears well-developed and well-nourished. No distress.   HENT:   Head: Normocephalic and atraumatic.   Right Ear: Tympanic membrane normal.   Left Ear: Tympanic membrane normal.   Mouth/Throat: Oropharynx is clear and moist.   Eyes: Conjunctivae and EOM are normal. No scleral icterus.   Neck: Normal range of motion and phonation normal. No tracheal deviation present.   Cardiovascular: Normal rate, regular rhythm and intact distal pulses.    Pulmonary/Chest: Effort normal. No respiratory distress.   Abdominal: Soft. He exhibits no distension. There is no tenderness.   Musculoskeletal: Normal range of motion. He exhibits no edema and no tenderness.   Lymphadenopathy:     He has no cervical adenopathy.   Neurological: He is alert and oriented to person, place, and time. He has normal strength. He displays tremor (left hand). No sensory deficit. He exhibits abnormal muscle tone (RUE weakness at  baseline).   Skin: Skin is warm and dry. No rash noted.        MDM     Differential Diagnosis; Clinical Impression; Plan:     Headache and left arm cramping.  History of CVA, on baclofen and increasing dose.  Will check labs and CT      3:56 PM  PAtient reassessed and tremor is improved.  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  Labs reviewed and significant for trop 0.06, which is at baseline as well as Cr 3.56 at baseline due to his chronic renal failure.  Will discharge home with instructions to continue baclofen at previous dose and follow-up with his neurologist.  Jorje Guild, MD    Procedures

## 2011-09-11 NOTE — ED Notes (Signed)
Patient to CT via wheelchair with Samuella Cota, RN.

## 2011-09-11 NOTE — ED Notes (Signed)
Patient ambulatory out of department without complaints or questions regarding discharge instructions accompanied by family member.

## 2011-09-19 NOTE — Progress Notes (Signed)
[name] is a 46 y.o. male seen in f/u CVA Nov . 2012     SUBJECTIVE:   On baclofen from Neurology  for spasticity.  When dose increased to TID, he had tremor and  headache and went to the ER.[name] Now he is    Tolerating baclofen bid .     S/p CVA with continued  Rt hemipresis ,  rt numbness, intermittent mild expressive aphasia,    Emotional lability and  difficulty concentrating. He has not passed work simulation with therapy . His typing speed  is not up to the desirable level.  He has F/u PT eval today.     Past Medical History   Diagnosis Date   ??? Chronic kidney disease      hypertensive nephrosclerosis   ??? Hypertension    ??? Anemia    ??? Coronary artery disease    ??? Low back pain    ??? Stroke 06/09/2011   ??? HTN (hypertension) 06/09/2011   ??? Secondary hyperparathyroidism    ??? Thrombocytopenia    ??? Accelerated hypertension      Hospitalization 2008 -[name]/ vision disturbance  and 2010 with rt arm pain      Current Outpatient Prescriptions   Medication Sig Dispense Refill   ??? oxyCODONE-acetaminophen (PERCOCET) 5-325 mg per tablet Take 1 Tab by mouth every six (6) hours as needed.         ??? amLODIPine (NORVASC) 10 mg tablet Take 1 Tab by mouth two (2) times a day.  60 Tab  11   ??? hydrALAZINE (APRESOLINE) 25 mg tablet Take 3 Tabs by mouth two (2) times a day.  180 Tab  11   ??? acetaminophen 650 mg Tab Take 650 mg by mouth every four (4) hours as needed for Fever (for fever greater than 100.4 F).  30 Tab  11   ??? atorvastatin (LIPITOR) 10 mg tablet Take 1 Tab by mouth daily.  30 Tab  11   ??? clopidogrel (PLAVIX) 75 mg tablet Take 1 Tab by mouth daily.  30 Tab  11   ??? labetalol (NORMODYNE) 200 mg tablet Take 1 Tab by mouth every twelve (12) hours.  60 Tab  12     No Known Allergies  .  History   Smoking status   ??? Current Some Day Smoker -- 0.5 packs/day for 24 years   Smokeless tobacco   ??? Never Used       Review of Systems  Review of Systems - negative except as per HPI      Physical Examination:  BP 110/72   Wt 203  lb (92.08 kg)  Physical Exam    General: Appears in no acute distress  Lung-clear   Heart -reg  Abdomen -bs+, soft nt  Extremities - 1+ ankle edema  Neuro:   Rt arm  mod HP  Rt leg Mild HP  Rt fingers moderately slow RAM  decr light touch  Sens. rt arm and leg       .        Assessment/Plan  1. Stroke  METABOLIC PANEL, COMPREHENSIVE, PR COLLECTION VENOUS BLOOD,VENIPUNCTURE   2. CKD (chronic kidney disease)  METABOLIC PANEL, COMPREHENSIVE, PR COLLECTION VENOUS BLOOD,VENIPUNCTURE   3. HTN (hypertension)  METABOLIC PANEL, COMPREHENSIVE, PR COLLECTION VENOUS BLOOD,VENIPUNCTURE   4. Encounter for long-term (current) use of other medications  METABOLIC PANEL, COMPREHENSIVE, PR COLLECTION VENOUS BLOOD,VENIPUNCTURE     S/P CVA ,  Unable to work at present.  P: continue therapy. F/u [name]/Neurology .    Check LFTs . If ok ,consider Pravastatin   HTN well controlled. Same rx  CKD , f/u labs pending  Stop smoking advised again   Follow up in 3 mos  or PRN.    Author:  Ysidro Evert, MD 10:37 AM2/23/2013

## 2011-09-19 NOTE — Patient Instructions (Signed)
MyChart Activation    Thank you for requesting access to MyChart. Please follow the instructions below to securely access and download your online medical record. MyChart allows you to send messages to your doctor, view your test results, renew your prescriptions, schedule appointments, and more.    How Do I Sign Up?    1. In your internet browser, go to [email or web address]  2. Click on the First Time User? Click Here link in the Sign In box. You will be redirect to the New Member Sign Up page.  3. Enter your MyChart Access Code exactly as it appears below. You will not need to use this code after you???ve completed the sign-up process. If you do not sign up before the expiration date, you must request a new code.    MyChart Access Code: [email or web address]  Expires: 12/18/2011 10:45 AM (This is the date your MyChart access code will expire)    4. Enter the last four digits of your Social Security Number (xxxx) and Date of Birth (mm/dd/yyyy) as indicated and click Submit. You will be taken to the next sign-up page.  5. Create a MyChart ID. This will be your MyChart login ID and cannot be changed, so think of one that is secure and easy to remember.  6. Create a MyChart password. You can change your password at any time.  7. Enter your Password Reset Question and Answer. This can be used at a later time if you forget your password.   8. Enter your e-mail address. You will receive e-mail notification when new information is available in MyChart.  9. Click Sign Up. You can now view and download portions of your medical record.  10. Click the Download Summary menu link to download a portable copy of your medical information.    Additional Information    If you have questions, please visit the Frequently Asked Questions section of the MyChart website at [email or web address] Remember, MyChart is NOT to be used for urgent needs. For medical emergencies, dial 911.      OTC Miralax , generic , one capfull  in 8 ounces of water daily as needed for constipation   Return in 3 months.   Stop smoking

## 2011-09-20 LAB — METABOLIC PANEL, COMPREHENSIVE
A-G Ratio: 1.5 (ref 1.1–2.5)
ALT (SGPT): 33 IU/L (ref 0–44)
AST (SGOT): 30 IU/L (ref 0–40)
Albumin: 4.7 g/dL (ref 3.5–5.5)
Alk. phosphatase: 66 IU/L (ref 25–150)
BUN/Creatinine ratio: 8 — ABNORMAL LOW (ref 9–20)
BUN: 32 mg/dL — ABNORMAL HIGH (ref 6–24)
Bilirubin, total: 0.2 mg/dL (ref 0.0–1.2)
CO2: 19 mmol/L — ABNORMAL LOW (ref 20–32)
Calcium: 9.2 mg/dL (ref 8.7–10.2)
Chloride: 108 mmol/L (ref 97–108)
Creatinine: 4.05 mg/dL — ABNORMAL HIGH (ref 0.76–1.27)
GFR est non-AA: 17 mL/min/{1.73_m2} — ABNORMAL LOW (ref >59)
GLOBULIN, TOTAL: 3.1 g/dL (ref 1.5–4.5)
Glucose: 87 mg/dL (ref 65–99)
Potassium: 4.3 mmol/L (ref 3.5–5.2)
Protein, total: 7.8 g/dL (ref 6.0–8.5)
Sodium: 142 mmol/L (ref 134–144)
eGFR If African American: 20 mL/min/{1.73_m2} — ABNORMAL LOW (ref >59)

## 2011-09-22 MED ORDER — PRAVASTATIN 10 MG TAB
10 mg | ORAL_TABLET | ORAL | Status: DC
Start: 2011-09-22 — End: 2011-12-07

## 2011-09-22 NOTE — Progress Notes (Signed)
Quick Note:    D/[name] pt. Results.  ______

## 2011-09-22 NOTE — Telephone Encounter (Signed)
[name] pt. Add Pravachol . rv 12/19/11 or prn sooner

## 2011-12-07 MED ORDER — PRAVASTATIN 10 MG TAB
10 mg | ORAL_TABLET | ORAL | Status: DC
Start: 2011-12-07 — End: 2013-01-28

## 2011-12-12 NOTE — Patient Instructions (Addendum)
MyChart Activation    Thank you for requesting access to MyChart. Please follow the instructions below to securely access and download your online medical record. MyChart allows you to send messages to your doctor, view your test results, renew your prescriptions, schedule appointments, and more.    How Do I Sign Up?    1. In your internet browser, go to [email or web address]  2. Click on the First Time User? Click Here link in the Sign In box. You will be redirect to the New Member Sign Up page.  3. Enter your MyChart Access Code exactly as it appears below. You will not need to use this code after you???ve completed the sign-up process. If you do not sign up before the expiration date, you must request a new code.    MyChart Access Code: [email or web address]  Expires: 12/18/2011 11:45 AM (This is the date your MyChart access code will expire)    4. Enter the last four digits of your Social Security Number (xxxx) and Date of Birth (mm/dd/yyyy) as indicated and click Submit. You will be taken to the next sign-up page.  5. Create a MyChart ID. This will be your MyChart login ID and cannot be changed, so think of one that is secure and easy to remember.  6. Create a MyChart password. You can change your password at any time.  7. Enter your Password Reset Question and Answer. This can be used at a later time if you forget your password.   8. Enter your e-mail address. You will receive e-mail notification when new information is available in MyChart.  9. Click Sign Up. You can now view and download portions of your medical record.  10. Click the Download Summary menu link to download a portable copy of your medical information.    Additional Information    If you have questions, please visit the Frequently Asked Questions section of the MyChart website at [email or web address] Remember, MyChart is NOT to be used for urgent needs. For medical emergencies, dial 911.      Stop smoking   Continue home  exercises for physical therapy   appt with Rehab Physician is being made for you   Return here in 3 months.   Continue the same medications.

## 2011-12-12 NOTE — Progress Notes (Signed)
[name] is a 46 y.o. male     SUBJECTIVE:    When under stress , rt arm and leg increased weakness with  increased expressive aphasia,   Despite Zoloft qhs x 3 weeks from Neurologist.   When this  happens, he feels unable to work .  Mild fatigue after morning meds.   Past Medical History   Diagnosis Date   ??? Chronic kidney disease      hypertensive nephrosclerosis   ??? Hypertension    ??? Anemia    ??? Coronary artery disease    ??? Low back pain    ??? Stroke 06/09/2011   ??? HTN (hypertension) 06/09/2011   ??? Secondary hyperparathyroidism    ??? Thrombocytopenia    ??? Accelerated hypertension      Hospitalization 2008 -[name]/ vision disturbance  and 2010 with rt arm pain      Current Outpatient Prescriptions   Medication Sig Dispense Refill   ??? SERTRALINE HCL (ZOLOFT PO) Take  by mouth.       ??? pravastatin (PRAVACHOL) 10 mg tablet TAKE 1 TABLET BY MOUTH EVERY MONDAY, WEDNESDAY, FRIDAY.  11 Tab  2   ??? oxyCODONE-acetaminophen (PERCOCET) 5-325 mg per tablet Take 1 Tab by mouth every six (6) hours as needed.         ??? amLODIPine (NORVASC) 10 mg tablet Take 1 Tab by mouth two (2) times a day.  60 Tab  11   ??? hydrALAZINE (APRESOLINE) 25 mg tablet Take 3 Tabs by mouth two (2) times a day.  180 Tab  11   ??? acetaminophen 650 mg Tab Take 650 mg by mouth every four (4) hours as needed for Fever (for fever greater than 100.4 F).  30 Tab  11   ??? clopidogrel (PLAVIX) 75 mg tablet Take 1 Tab by mouth daily.  30 Tab  11   ??? labetalol (NORMODYNE) 200 mg tablet Take 1 Tab by mouth every twelve (12) hours.  60 Tab  12     Allergies   Allergen Reactions   ??? Lipitor (Atorvastatin) Other (comments)     Abnormal LFT     .  History   Smoking status   ??? Current Some Day Smoker -- 0.5 packs/day for 24 years   Smokeless tobacco   ??? Never Used       Review of Systems  Review of Systems - negative except as per HPI      Physical Examination:  BP 110/70   Wt 200 lb (90.719 kg)  Physical Exam    General: Appears in no acute distress  Lung-  Heart -  reg  Abdomen -  Extremities -   Rt mild hemiparesis and decr LT sens rt arm and leg   Speech generallly fluent.     .   Results for orders placed in visit on 12/12/11   ALT       Component Value Range    ALT 27  0 - 44 IU/L   LIPID PANEL       Component Value Range    Cholesterol, total 220 (*) 100 - 199 mg/dL    Triglyceride 93  0 - 149 mg/dL    HDL Cholesterol 81  >39 mg/dL    VLDL, calculated 19  5 - 40 mg/dL    LDL, calculated 147 (*) 0 - 99 mg/dL        Assessment/Plan  1. Stroke  ALT, LIPID PANEL, REFERRAL TO PHYSICIAL MEDICINE REHAB  2. HTN (hypertension)  ALT, LIPID PANEL, REFERRAL TO PHYSICIAL MEDICINE REHAB   3. Hyperlipidemia  ALT, LIPID PANEL, REFERRAL TO PHYSICIAL MEDICINE REHAB   4. Encounter for long-term (current) use of other medications  ALT, LIPID PANEL, REFERRAL TO PHYSICIAL MEDICINE REHAB     I find him unable to work now due to CVA 6 months ago, despite PT/OT/ST .  I referred him to [facility].   Disability form to be completed.   Plan to increase Pravastatin to daily   Same rx for severe HTN under good control.   Follow up in 3 mos or PRN.    Author:  Ysidro Evert, MD 12:00 PM5/19/2013

## 2011-12-13 LAB — LIPID PANEL
Cholesterol, total: 220 mg/dL — ABNORMAL HIGH (ref 100–199)
HDL Cholesterol: 81 mg/dL (ref >39)
LDL, calculated: 120 mg/dL — ABNORMAL HIGH (ref 0–99)
Triglyceride: 93 mg/dL (ref 0–149)
VLDL, calculated: 19 mg/dL (ref 5–40)

## 2011-12-13 LAB — ALT: ALT (SGPT): 27 IU/L (ref 0–44)

## 2011-12-19 NOTE — Progress Notes (Signed)
Quick Note:    D/Onna Nodal pt . Increase Pravachol to 5 d /week  ______

## 2012-02-11 NOTE — Progress Notes (Signed)
[name] is a 46 y.o. male     SUBJECTIVE:    With emotional upset and cold exposure  He experiences temporary flare up of chronic   rt hemiparesis. Typing is improved, but not up to his pre stroke level   Pt is to see Dr. Jerrilyn Cairo 8/26  And Rehab physician Oct 1.   Started walking for exercise 3 weeks ago . Since then , right leg intermittent involuntary movement.   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  For 40 minutes on a hot day .  Past Medical History   Diagnosis Date   ??? Chronic kidney disease      hypertensive nephrosclerosis   ??? Hypertension    ??? Anemia    ??? Coronary artery disease    ??? Low back pain    ??? Stroke 06/09/2011   ??? HTN (hypertension) 06/09/2011   ??? Secondary hyperparathyroidism    ??? Thrombocytopenia    ??? Accelerated hypertension      Hospitalization 2008 -Mayte Diers/ vision disturbance  and 2010 with rt arm pain      Current Outpatient Prescriptions   Medication Sig Dispense Refill   ??? SERTRALINE HCL (ZOLOFT PO) Take  by mouth.       ??? pravastatin (PRAVACHOL) 10 mg tablet TAKE 1 TABLET BY MOUTH EVERY MONDAY, WEDNESDAY, FRIDAY.  11 Tab  2   ??? oxyCODONE-acetaminophen (PERCOCET) 5-325 mg per tablet Take 1 Tab by mouth every six (6) hours as needed.         ??? amLODIPine (NORVASC) 10 mg tablet Take 1 Tab by mouth two (2) times a day.  60 Tab  11   ??? hydrALAZINE (APRESOLINE) 25 mg tablet Take 3 Tabs by mouth two (2) times a day.  180 Tab  11   ??? acetaminophen 650 mg Tab Take 650 mg by mouth every four (4) hours as needed for Fever (for fever greater than 100.4 F).  30 Tab  11   ??? clopidogrel (PLAVIX) 75 mg tablet Take 1 Tab by mouth daily.  30 Tab  11   ??? labetalol (NORMODYNE) 200 mg tablet Take 1 Tab by mouth every twelve (12) hours.  60 Tab  12     Allergies   Allergen Reactions   ??? Lipitor (Atorvastatin) Other (comments)     Abnormal LFT     .  History   Smoking status   ??? Current Some Day Smoker -- 0.5 packs/day for 24 years   Smokeless tobacco   ??? Never Used       Review of Systems  Review  of Systems - negative except as per HPI      Physical Examination:  BP 132/82   Ht 5\' 11"  (1.803 m)   Wt 200 lb (90.719 kg)   BMI 27.89 kg/m2  Physical Exam    General: Appears in no acute distress  Lung clear   Heart - reg Roselia Snipe/o mgr   Abdomen -bs+,soft, nt  Extremities - 1+ ankle edema    Neuro:   Mild rt Hemiparesis; mild rt pronator drift   Rt leg spontaneous movements at times  Mild decr LT sens rt arm and leg  Infreq Mild expressive  aphasia.   Rest neuro  Exam non focal       .     Assessment/Plan  1. Stroke  METABOLIC PANEL, COMPREHENSIVE, LIPID PANEL, PR COLLECTION VENOUS BLOOD,VENIPUNCTURE   2. CKD (chronic kidney disease)  METABOLIC PANEL, COMPREHENSIVE, LIPID  PANEL, PR COLLECTION VENOUS BLOOD,VENIPUNCTURE   3. HTN (hypertension)  METABOLIC PANEL, COMPREHENSIVE, LIPID PANEL, PR COLLECTION VENOUS BLOOD,VENIPUNCTURE   4. Encounter for long-term (current) use of other medications  METABOLIC PANEL, COMPREHENSIVE, LIPID PANEL, PR COLLECTION VENOUS BLOOD,VENIPUNCTURE   5. Hyperlipidemia  METABOLIC PANEL, COMPREHENSIVE, LIPID PANEL, PR COLLECTION VENOUS BLOOD,VENIPUNCTURE     He is overall just slightly improved s/p CVA vs last visit here.  Continue current plan . Lipids pending   Advised smoking cessation   Follow up in 4 mos or PRN.    Author:  Ysidro Evert, MD 4:01 PM7/17/2013

## 2012-02-11 NOTE — Patient Instructions (Addendum)
No smoking . Keep Neurology and Rehab doctor's appointments. See Kidney physician   Continue your rehab exercises at home  Return in 4 months.

## 2012-02-12 LAB — METABOLIC PANEL, COMPREHENSIVE
A-G Ratio: 1.3 (ref 1.1–2.5)
ALT (SGPT): 15 IU/L (ref 0–44)
AST (SGOT): 22 IU/L (ref 0–40)
Albumin: 4.5 g/dL (ref 3.5–5.5)
Alk. phosphatase: 61 IU/L (ref 44–102)
BUN/Creatinine ratio: 8 — ABNORMAL LOW (ref 9–20)
BUN: 32 mg/dL — ABNORMAL HIGH (ref 6–24)
Bilirubin, total: 0.3 mg/dL (ref 0.0–1.2)
CO2: 21 mmol/L (ref 19–28)
Calcium: 9.2 mg/dL (ref 8.7–10.2)
Chloride: 106 mmol/L (ref 97–108)
Creatinine: 3.8 mg/dL — ABNORMAL HIGH (ref 0.76–1.27)
GFR est non-AA: 18 mL/min/{1.73_m2} — ABNORMAL LOW (ref >59)
GLOBULIN, TOTAL: 3.4 g/dL (ref 1.5–4.5)
Glucose: 87 mg/dL (ref 65–99)
Potassium: 4.3 mmol/L (ref 3.5–5.2)
Protein, total: 7.9 g/dL (ref 6.0–8.5)
Sodium: 139 mmol/L (ref 134–144)
eGFR If African American: 21 mL/min/{1.73_m2} — ABNORMAL LOW (ref >59)

## 2012-02-12 LAB — LIPID PANEL
Cholesterol, total: 242 mg/dL — ABNORMAL HIGH (ref 100–199)
HDL Cholesterol: 65 mg/dL (ref >39)
LDL, calculated: 138 mg/dL — ABNORMAL HIGH (ref 0–99)
Triglyceride: 197 mg/dL — ABNORMAL HIGH (ref 0–149)
VLDL, calculated: 39 mg/dL (ref 5–40)

## 2012-02-12 NOTE — Progress Notes (Signed)
Quick Note:    L/M results. Advised Same meds. Low chol diet  ______

## 2012-08-24 NOTE — Progress Notes (Signed)
[name] is a 47 y.o. male     SUBJECTIVE:    Mild rt hp and  Expressive aphasia from CVA 2012 with  Mild deficits. Malvin Johns Neuro Dr. Tiburcio Pea yesterday . Repeat MRI Brain soon   Has ED. [name] deneis cp.  Past Medical History   Diagnosis Date   ??? Chronic kidney disease      hypertensive nephrosclerosis   ??? Hypertension    ??? Anemia    ??? Coronary artery disease    ??? Low back pain    ??? Stroke 06/09/2011   ??? HTN (hypertension) 06/09/2011   ??? Secondary hyperparathyroidism    ??? Thrombocytopenia    ??? Accelerated hypertension      Hospitalization 2008 -Donata Reddick/ vision disturbance  and 2010 with rt arm pain      Current Outpatient Prescriptions   Medication Sig Dispense Refill   ??? SERTRALINE HCL (ZOLOFT PO) Take  by mouth.       ??? pravastatin (PRAVACHOL) 10 mg tablet TAKE 1 TABLET BY MOUTH EVERY MONDAY, WEDNESDAY, FRIDAY.  11 Tab  2   ??? oxyCODONE-acetaminophen (PERCOCET) 5-325 mg per tablet Take 1 Tab by mouth every six (6) hours as needed.         ??? amLODIPine (NORVASC) 10 mg tablet Take 1 Tab by mouth two (2) times a day.  60 Tab  11   ??? hydrALAZINE (APRESOLINE) 25 mg tablet Take 3 Tabs by mouth two (2) times a day.  180 Tab  11   ??? acetaminophen 650 mg Tab Take 650 mg by mouth every four (4) hours as needed for Fever (for fever greater than 100.4 F).  30 Tab  11   ??? clopidogrel (PLAVIX) 75 mg tablet Take 1 Tab by mouth daily.  30 Tab  11   ??? labetalol (NORMODYNE) 200 mg tablet Take 1 Tab by mouth every twelve (12) hours.  60 Tab  12     Allergies   Allergen Reactions   ??? Lipitor (Atorvastatin) Other (comments)     Abnormal LFT     .  History   Smoking status   ??? Current Some Day Smoker -- 0.50 packs/day for 24 years   Smokeless tobacco   ??? Never Used       Review of Systems  Review of Systems - negative except as per HPI      Physical Examination:  BP 110/82   Temp(Src) 97.2 ??F (36.2 ??C)   Wt 196 lb (88.905 kg)   BMI 27.35 kg/m2  Physical Exam    General: Appears in no acute distress  Lung-clear  Heart - reg Brendyn Mclaren/o mgr    Abdomen -bs+,soft, nt  gu : nl genitalia Chlora Mcbain/o hernia  Extremities - no ankle edema  Mild expr aphasia   Mild rt hp .   [name]    Assessment/Plan  1. HTN (hypertension)  LIPID PANEL, METABOLIC PANEL, COMPREHENSIVE, URINALYSIS Slaton Reaser/ RFLX MICROSCOPIC, PR COLLECTION VENOUS BLOOD,VENIPUNCTURE   2. CKD (chronic kidney disease)  LIPID PANEL, METABOLIC PANEL, COMPREHENSIVE, URINALYSIS Aliscia Clayton/ RFLX MICROSCOPIC, PR COLLECTION VENOUS BLOOD,VENIPUNCTURE   3. Encounter for long-term (current) use of other medications  LIPID PANEL, METABOLIC PANEL, COMPREHENSIVE, URINALYSIS Chavela Justiniano/ RFLX MICROSCOPIC, PR COLLECTION VENOUS BLOOD,VENIPUNCTURE   4. Hypercholesterolemia  LIPID PANEL, METABOLIC PANEL, COMPREHENSIVE, URINALYSIS Dima Mini/ RFLX MICROSCOPIC, PR COLLECTION VENOUS BLOOD,VENIPUNCTURE     HTN good control.  Referred to Nephrology   Await Cr   Await lipids.   S/p CVA deficits appear stable. F/u  Dr. Kirtland Bouchard. [place] .   Follow up in 4 mos or PRN.    Author:  Ysidro Evert, MD 11:17 AM1/28/2014

## 2012-08-24 NOTE — Patient Instructions (Addendum)
Call [facility] ( you have seen D.r Renee Rival in the past ) for an appt  Return here in 4 months.   Stop smoking

## 2012-08-25 ENCOUNTER — Encounter

## 2012-08-25 LAB — METABOLIC PANEL, COMPREHENSIVE
A-G Ratio: 1.3 (ref 1.1–2.5)
ALT (SGPT): 15 IU/L (ref 0–44)
AST (SGOT): 32 IU/L (ref 0–40)
Albumin: 4.4 g/dL (ref 3.5–5.5)
Alk. phosphatase: 52 IU/L (ref 39–117)
BUN/Creatinine ratio: 8 — ABNORMAL LOW (ref 9–20)
BUN: 35 mg/dL — ABNORMAL HIGH (ref 6–24)
Bilirubin, total: 0.2 mg/dL (ref 0.0–1.2)
CO2: 19 mmol/L (ref 19–28)
Calcium: 9.6 mg/dL (ref 8.7–10.2)
Chloride: 103 mmol/L (ref 97–108)
Creatinine: 4.12 mg/dL — ABNORMAL HIGH (ref 0.76–1.27)
GFR est AA: 19 mL/min/{1.73_m2} — ABNORMAL LOW (ref >59)
GFR est non-AA: 16 mL/min/{1.73_m2} — ABNORMAL LOW (ref >59)
GLOBULIN, TOTAL: 3.3 g/dL (ref 1.5–4.5)
Glucose: 92 mg/dL (ref 65–99)
Potassium: 4.4 mmol/L (ref 3.5–5.2)
Protein, total: 7.7 g/dL (ref 6.0–8.5)
Sodium: 141 mmol/L (ref 134–144)

## 2012-08-25 LAB — LIPID PANEL
Cholesterol, total: 238 mg/dL — ABNORMAL HIGH (ref 100–199)
HDL Cholesterol: 73 mg/dL (ref >39)
LDL, calculated: 134 mg/dL — ABNORMAL HIGH (ref 0–99)
Triglyceride: 156 mg/dL — ABNORMAL HIGH (ref 0–149)
VLDL, calculated: 31 mg/dL (ref 5–40)

## 2012-08-25 LAB — URINALYSIS W/ RFLX MICROSCOPIC
Bilirubin: NEGATIVE
Blood: NEGATIVE
Glucose: NEGATIVE
Ketone: NEGATIVE
Leukocyte Esterase: NEGATIVE
Nitrites: NEGATIVE
Specific Gravity: 1.018 (ref 1.005–1.030)
Urobilinogen: 0.2 mg/dL (ref 0.0–1.9)
pH (UA): 6 (ref 5.0–7.5)

## 2012-08-25 LAB — MICROSCOPIC EXAMINATION: Bacteria: NONE SEEN

## 2012-08-25 NOTE — Progress Notes (Signed)
Quick Note:    PC. Gave results. Continue same meds. Avoid NSAIDS.  B- fax to DR. F[facility] . Thanks , [facility]  ______

## 2012-09-10 NOTE — Procedures (Signed)
[facility]  *** FINAL REPORT ***    Name: [name], [name]  MRN: [id number]  DOB: Mar 22, 1966  HIS Order #: [id number]  TRAKnet Visit #: [id number]  Date: 10 Sep 2012    TYPE OF TEST: Cerebrovascular Duplex    REASON FOR TEST  Cerebrovascular accident    Right Carotid:-             Proximal               Mid                 Distal  cm/s  Systolic  Diastolic  Systolic  Diastolic  Systolic  Diastolic  CCA:     03.4      21.0                            60.0      21.0  Bulb:  ECA:     79.0  ICA:     47.0      12.0                            53.0      22.0  ICA/CCA:  0.8       0.6    ICA Stenosis:    Right Vertebral:-  Finding: Antegrade  Sys:       31.0  Dia:    Right Subclavian:    Left Carotid:-            Proximal                Mid                 Distal  cm/s  Systolic  Diastolic  Systolic  Diastolic  Systolic  Diastolic  CCA:     74.2      26.0                            71.0      21.0  Bulb:  ECA:     87.0  ICA:     47.0      17.0                            57.0      28.0  ICA/CCA:  0.7       0.8    ICA Stenosis:    Left Vertebral:-  Finding: Antegrade  Sys:       46.0  Dia:    Left Subclavian:    INTERPRETATION/FINDINGS  PROCEDURE:  Evaluation of the extracranial cerebrovascular arteries  with ultrasound (B-mode imaging, pulsed Doppler, color Doppler).  Includes the common carotid, internal carotid, external carotid, and  vertebral arteries.    FINDINGS:       Right:  Internal carotid velocity is within normal limits, there  is no narrowing of the flow channel on color Doppler imaging, and no  plaque is visualized on B-mode imaging.  The common and external  carotid arteries are patent and without evidence of hemodynamically  significant stenosis.       Left:    Internal carotid velocity is within normal limits, there   is no narrowing of the flow channel on color Doppler imaging, and no  plaque is  visualized on B-mode imaging.  The common and external  carotid arteries are patent and  without evidence of hemodynamically  significant stenosis.    IMPRESSION:  Consistent with no stenosis of the right internal carotid   and no stenosis of the left internal carotid.  Vertebrals are patent  with antegrade flow.    ADDITIONAL COMMENTS    I have personally reviewed the data relevant to the interpretation of  this  study.    TECHNOLOGIST: Bonnye Fava, RVT  Signed: 09/10/2012 01:48 PM    PHYSICIAN: Gaetana Michaelis, MD  Signed: 09/13/2012 06:47 AM

## 2012-09-13 NOTE — Procedures (Signed)
[facility]  *** FINAL REPORT ***    Name: [name], [name]  MRN: [id number]  DOB: 12 Oct 1965  HIS Order #: [id number]  TRAKnet Visit #: [id number]  Date: 10 Sep 2012    TYPE OF TEST: Cerebrovascular Duplex    REASON FOR TEST  Cerebrovascular accident    Right Carotid:-             Proximal               Mid                 Distal  cm/s  Systolic  Diastolic  Systolic  Diastolic  Systolic  Diastolic  CCA:     74.0      21.0                            60.0      21.0  Bulb:  ECA:     79.0  ICA:     47.0      12.0                            53.0      22.0  ICA/CCA:  0.8       0.6    ICA Stenosis:    Right Vertebral:-  Finding: Antegrade  Sys:       31.0  Dia:    Right Subclavian:    Left Carotid:-            Proximal                Mid                 Distal  cm/s  Systolic  Diastolic  Systolic  Diastolic  Systolic  Diastolic  CCA:     91.0      26.0                            71.0      21.0  Bulb:  ECA:     87.0  ICA:     47.0      17.0                            57.0      28.0  ICA/CCA:  0.7       0.8    ICA Stenosis:    Left Vertebral:-  Finding: Antegrade  Sys:       46.0  Dia:    Left Subclavian:    INTERPRETATION/FINDINGS  PROCEDURE:  Evaluation of the extracranial cerebrovascular arteries  with ultrasound (B-mode imaging, pulsed Doppler, color Doppler).  Includes the common carotid, internal carotid, external carotid, and  vertebral arteries.    FINDINGS:       Right:  Internal carotid velocity is within normal limits, there  is no narrowing of the flow channel on color Doppler imaging, and no  plaque is visualized on B-mode imaging.  The common and external  carotid arteries are patent and without evidence of hemodynamically  significant stenosis.       Left:    Internal carotid velocity is within normal limits, there   is no narrowing of the flow channel on color Doppler imaging, and no  plaque is   visualized on B-mode imaging.  The common and external  carotid arteries are patent and  without evidence of hemodynamically  significant stenosis.    IMPRESSION:  Consistent with no stenosis of the right internal carotid   and no stenosis of the left internal carotid.  Vertebrals are patent  with antegrade flow.    ADDITIONAL COMMENTS    I have personally reviewed the data relevant to the interpretation of  this  study.    TECHNOLOGIST: James Kaczor, RVT  Signed: 09/10/2012 01:48 PM    PHYSICIAN: Nahshon Reich, MD  Signed: 09/13/2012 06:47 AM

## 2012-09-17 NOTE — Telephone Encounter (Signed)
Done.

## 2012-09-17 NOTE — Telephone Encounter (Signed)
Please fax ov notes to Dr. Deirdre Peer, appt 09/22/12   Velna Hatchet [phone]  Fax  [phone]

## 2013-09-23 NOTE — Patient Instructions (Signed)
Stop Ibuprofen, elevate feet, avoid salt, use Tylenol as needed for pain   Stop smoking   [facility] for [facility]

## 2013-09-23 NOTE — Progress Notes (Signed)
[name] is a 48 y.o. male  He ran out of meds from augst 2014  -Jan. 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   10 days of  rt leg > left lg swelling x 10 days  7 days of  Ibuprofen 800 mg bid  From dentist for chipped tooth .   Increased urgency urine, stream strength weak at times.   Breathing ok [name] nausea    Past Medical History   Diagnosis Date   ??? Chronic kidney disease      hypertensive nephrosclerosis   ??? Hypertension    ??? Anemia    ??? Coronary artery disease    ??? Low back pain    ??? Stroke 06/09/2011   ??? HTN (hypertension) 06/09/2011   ??? Secondary hyperparathyroidism    ??? Thrombocytopenia    ??? Accelerated hypertension      Hospitalization 2008 -[place]/ vision disturbance  and 2010 with rt arm pain      Current Outpatient Prescriptions   Medication Sig Dispense Refill   ??? ondansetron (ZOFRAN ODT) 4 mg disintegrating tablet DISSOLVE 1 TABLET ON TONGUE EVERY 12 HOURS AS NEEDED FOR NAUSEA  3 tablet  0   ??? amlodipine (NORVASC) 10 mg tablet TAKE 2 TABLETS BY MOUTH TWICE A DAY  360 tablet  0   ??? pravastatin (PRAVACHOL) 10 mg tablet TAKE 1 TABLET BY MOUTH EVERY MONDAY, WEDNESDAY AND FRIDAY  90 Tab  0   ??? hydrALAZINE (APRESOLINE) 50 mg tablet TAKE 1 TABLET BY MOUTH 3 TIMES A DAY  270 Tab  3   ??? clopidogrel (PLAVIX) 75 mg tablet TAKE 1 TABLET BY MOUTH DAILY  90 Tab  3   ??? labetalol (NORMODYNE) 200 mg tablet TAKE 1 TABLET BY MOUTH EVERY 12 HOURS  180 Tab  3   ??? SERTRALINE HCL (ZOLOFT PO) Take  by mouth.       ??? oxyCODONE-acetaminophen (PERCOCET) 5-325 mg per tablet Take 1 Tab by mouth every six (6) hours as needed.         ??? hydrALAZINE (APRESOLINE) 25 mg tablet Take 3 Tabs by mouth two (2) times a day.  180 Tab  11   ??? acetaminophen 650 mg Tab Take 650 mg by mouth every four (4) hours as needed for Fever (for fever greater than 100.4 F).  30 Tab  11     Allergies   Allergen Reactions   ??? Lipitor [Atorvastatin] Other (comments)     Abnormal LFT     .   History   Smoking status   ??? Current Some Day Smoker -- 0.50 packs/day for 24 years   Smokeless tobacco   ??? Never Used       Review of Systems  Review of Systems - negative except as per HPI      Physical Examination:  BP 110/80    Temp(Src) 96.6 ??F (35.9 ??C)    Ht 5\' 11"  (1.803 m)    Wt 191 lb (86.637 kg)    BMI 26.65 kg/m2     Physical Exam    General: Appears in no acute distress  Lung-clear  Heart - reg [name] mgr   Abdomen neg   Extremities - 2 + pretib edmea   B/l  Neuro : mild Rt HP baseline     .        Assessment/Plan  ICD-9-CM    1. Edema of both legs 782.3 URINALYSIS [name]/ [facility] MICROSCOPIC     CBC WITH AUTOMATED DIFF     METABOLIC PANEL, COMPREHENSIVE     PR COLLECTION VENOUS BLOOD,VENIPUNCTURE   2. HTN (hypertension) 401.9 URINALYSIS [name]/ [facility] MICROSCOPIC     CBC WITH AUTOMATED DIFF     METABOLIC PANEL, COMPREHENSIVE     PR COLLECTION VENOUS BLOOD,VENIPUNCTURE   3. CKD (chronic kidney disease) stage 4, GFR 15-29 ml/min 585.4 URINALYSIS [name]/ [facility] MICROSCOPIC     CBC WITH AUTOMATED DIFF     METABOLIC PANEL, COMPREHENSIVE     PR COLLECTION VENOUS BLOOD,VENIPUNCTURE     PHOSPHORUS   4. Encounter for long-term (current) use of other medications V58.69 URINALYSIS [name]/ [facility] MICROSCOPIC     CBC WITH AUTOMATED DIFF     METABOLIC PANEL, COMPREHENSIVE     PR COLLECTION VENOUS BLOOD,VENIPUNCTURE     Labs Cr 6.2 . Pt has ARF on CKD. Advised elevation of feet . Low K+ diet. Stop all NSAIDS.   Call [name] (old pt of [name]) now for prompt f//u .   HTN well controlled.   F/u sooner prn       Author:  [name], MD 12:21 PM2/28/2015

## 2013-09-24 LAB — CBC WITH AUTOMATED DIFF
ABS. BASOPHILS: 0 10*3/uL (ref 0.0–0.2)
ABS. EOSINOPHILS: 0.2 10*3/uL (ref 0.0–0.4)
ABS. IMM. GRANS.: 0 10*3/uL (ref 0.0–0.1)
ABS. MONOCYTES: 0.5 10*3/uL (ref 0.1–0.9)
ABS. NEUTROPHILS: 3.2 10*3/uL (ref 1.4–7.0)
Abs Lymphocytes: 1.1 10*3/uL (ref 0.7–3.1)
BASOPHILS: 1 %
EOSINOPHILS: 3 %
HCT: 31.2 % — ABNORMAL LOW (ref 37.5–51.0)
HGB: 10.5 g/dL — ABNORMAL LOW (ref 12.6–17.7)
IMMATURE GRANULOCYTES: 0 %
Lymphocytes: 22 %
MCH: 31.2 pg (ref 26.6–33.0)
MCHC: 33.7 g/dL (ref 31.5–35.7)
MCV: 93 fL (ref 79–97)
MONOCYTES: 11 %
NEUTROPHILS: 63 %
PLATELET: 188 10*3/uL (ref 150–379)
RBC: 3.37 x10E6/uL — ABNORMAL LOW (ref 4.14–5.80)
RDW: 14.2 % (ref 12.3–15.4)
WBC: 5 10*3/uL (ref 3.4–10.8)

## 2013-09-24 LAB — METABOLIC PANEL, COMPREHENSIVE
A-G Ratio: 1.5 (ref 1.1–2.5)
ALT (SGPT): 41 IU/L (ref 0–44)
AST (SGOT): 44 IU/L — ABNORMAL HIGH (ref 0–40)
Albumin: 4.3 g/dL (ref 3.5–5.5)
Alk. phosphatase: 74 IU/L (ref 39–117)
BUN/Creatinine ratio: 9 (ref 9–20)
BUN: 55 mg/dL — ABNORMAL HIGH (ref 6–24)
Bilirubin, total: <0.2 mg/dL (ref 0.0–1.2)
CO2: 16 mmol/L — ABNORMAL LOW (ref 18–29)
Calcium: 8.7 mg/dL (ref 8.7–10.2)
Chloride: 106 mmol/L (ref 97–108)
Creatinine: 6.2 mg/dL — ABNORMAL HIGH (ref 0.76–1.27)
GFR est AA: 12 mL/min/{1.73_m2} — ABNORMAL LOW (ref >59)
GFR est non-AA: 10 mL/min/{1.73_m2} — ABNORMAL LOW (ref >59)
GLOBULIN, TOTAL: 2.8 g/dL (ref 1.5–4.5)
Glucose: 102 mg/dL — ABNORMAL HIGH (ref 65–99)
Potassium: 4.4 mmol/L (ref 3.5–5.2)
Protein, total: 7.1 g/dL (ref 6.0–8.5)
Sodium: 139 mmol/L (ref 134–144)

## 2013-09-24 LAB — MICROSCOPIC EXAMINATION

## 2013-09-24 LAB — URINALYSIS W/ RFLX MICROSCOPIC
Bilirubin: NEGATIVE
Blood: NEGATIVE
Glucose: NEGATIVE
Ketone: NEGATIVE
Leukocyte Esterase: NEGATIVE
Nitrites: NEGATIVE
Specific Gravity: 1.016 (ref 1.005–1.030)
Urobilinogen: 0.2 mg/dL (ref 0.0–1.9)
pH (UA): 6 (ref 5.0–7.5)

## 2013-09-24 NOTE — Progress Notes (Signed)
Quick Note:    Pc. Spoke [name]/ pt. Advised prompt Nephrology f/u . Discussed that Dialysis may well be on the near term horizon .  ______

## 2013-09-24 NOTE — Addendum Note (Signed)
Addended by: Coralyn PearHENDRIX, Alaia Lordi. CLIFFORD IV on: 09/24/2013 09:46 AM     Modules accepted: Orders

## 2013-09-26 NOTE — Telephone Encounter (Signed)
Reason for call:   Returning call re a referral to kidney specialist.   # [phone]

## 2013-09-26 NOTE — Telephone Encounter (Signed)
[facility] , last 2 labs, office notes, and Demographics.   Please see pt today or tomorrow

## 2013-09-27 LAB — PHOSPHORUS: Phosphorus: 4 mg/dL (ref 2.5–4.5)

## 2013-09-27 LAB — SPECIMEN STATUS REPORT

## 2013-09-27 NOTE — Progress Notes (Signed)
Quick Note:    noted  ______

## 2013-10-22 MED ORDER — AMLODIPINE 10 MG TAB
10 mg | ORAL_TABLET | ORAL | Status: DC
Start: 2013-10-22 — End: 2013-11-02

## 2013-11-02 MED ORDER — AMLODIPINE 10 MG TAB
10 mg | ORAL_TABLET | ORAL | Status: AC
Start: 2013-11-02 — End: ?

## 2013-12-13 MED ORDER — LABETALOL 200 MG TAB
200 mg | ORAL_TABLET | ORAL | Status: AC
Start: 2013-12-13 — End: ?

## 2013-12-13 MED ORDER — HYDRALAZINE 50 MG TAB
50 mg | ORAL_TABLET | ORAL | Status: AC
Start: 2013-12-13 — End: ?

## 2013-12-13 MED ORDER — CLOPIDOGREL 75 MG TAB
75 mg | ORAL_TABLET | ORAL | Status: AC
Start: 2013-12-13 — End: ?

## 2013-12-13 MED ORDER — PRAVASTATIN 10 MG TAB
10 mg | ORAL_TABLET | ORAL | Status: AC
Start: 2013-12-13 — End: ?

## 2014-04-03 MED ORDER — CLINDAMYCIN 300 MG CAP
300 mg | ORAL_CAPSULE | Freq: Four times a day (QID) | ORAL | Status: AC
Start: 2014-04-03 — End: 2014-04-10

## 2014-04-03 MED ORDER — OXYCODONE-ACETAMINOPHEN 5 MG-325 MG TAB
5-325 mg | ORAL_TABLET | Freq: Four times a day (QID) | ORAL | Status: AC | PRN
Start: 2014-04-03 — End: ?

## 2014-04-03 MED ADMIN — oxyCODONE-acetaminophen (PERCOCET) 5-325 mg per tablet 2 Tab: ORAL | @ 23:00:00 | NDC 68084035511

## 2014-04-03 MED ADMIN — clindamycin (CLEOCIN) capsule 600 mg: ORAL | @ 23:00:00 | NDC 68084024311

## 2014-04-03 MED FILL — OXYCODONE-ACETAMINOPHEN 5 MG-325 MG TAB: 5-325 mg | ORAL | Qty: 2

## 2014-04-03 MED FILL — CLINDAMYCIN 150 MG CAP: 150 mg | ORAL | Qty: 4

## 2014-04-03 NOTE — ED Notes (Signed)
Pt is also having concerns over intermittent lower back pains.

## 2014-04-03 NOTE — ED Notes (Signed)
Triage:  Pt to the ED due to concern over a recently broken tooth, Pt has a cracked left side upper molar that is causing pain.  Pt is also concerned about possible infection at the site.  Pt states that he is concerned about developing an infection related to his general health history.

## 2014-04-03 NOTE — ED Provider Notes (Signed)
Patient is a 48 y.o. male presenting with dental problem and dental injury.   Dental Pain       Dental Injury        Pt. States that a piece of his left upper molar broke off while chewing yesterday. He states that he has had left upper dental pain since then. He awoke this morning with left facial swelling without erythema. Denies any fever, difficulty breathing, difficulty swallowing, SOB or chest pain. Denies any gum swelling or fum bleeding. Pt. Reports taking over the counter pain remedies without relief.       Past Medical History   Diagnosis Date   ??? Chronic kidney disease      hypertensive nephrosclerosis   ??? Hypertension    ??? Anemia    ??? Coronary artery disease    ??? Low back pain    ??? Stroke (HCC) 06/09/2011   ??? HTN (hypertension) 06/09/2011   ??? Secondary hyperparathyroidism (HCC)    ??? Thrombocytopenia (HCC)    ??? Accelerated hypertension      Hospitalization 2008 -[place]/ vision disturbance  and 2010 with rt arm pain         Past Surgical History   Procedure Laterality Date   ??? Pr acne surgery of skin abscess     ??? Hx orthopaedic       right miniscus repair   ??? Hx appendectomy           Family History   Problem Relation Age of Onset   ??? Cancer Father      lung   ??? Cancer Paternal Uncle    ??? Cancer Maternal Grandfather    ??? Cancer Paternal Grandfather    ??? Diabetes Mother    ??? Cancer Mother         History     Social History   ??? Marital Status: MARRIED     Spouse Name: N/A     Number of Children: N/A   ??? Years of Education: N/A     Occupational History   ??? Not on file.     Social History Main Topics   ??? Smoking status: Current Some Day Smoker -- 0.50 packs/day for 24 years   ??? Smokeless tobacco: Never Used   ??? Alcohol Use: No      Comment: infrequent    ??? Drug Use: No      Comment: periodic    ??? Sexual Activity:     Partners: Female     Other Topics Concern   ??? Not on file     Social History Narrative                  ALLERGIES: Lipitor      Review of Systems    Constitutional: Negative for activity change and appetite change.   HENT: Positive for dental problem. Negative for facial swelling, sore throat and trouble swallowing.    Eyes: Negative.    Respiratory: Negative for shortness of breath.    Cardiovascular: Negative.    Gastrointestinal: Negative for vomiting, abdominal pain and diarrhea.   Genitourinary: Negative for dysuria.   Musculoskeletal: Negative for back pain and neck pain.   Skin: Negative for color change.   Neurological: Negative for headaches.   Psychiatric/Behavioral: Negative.        Filed Vitals:    04/03/14 1805   BP: 123/89   Pulse: 74   Temp: 98.3 ??F (36.8 ??C)   Resp: 18   Height: 5'  11" (1.803 m)   Weight: 73.936 kg (163 lb)   SpO2: 98%            Physical Exam   Constitutional: He is oriented to person, place, and time. He appears well-nourished.   Black male, smoker   HENT:   Head: Normocephalic.   Mouth/Throat: Oropharynx is clear and moist.   Left upper posterior molar is broken and decayed; no obvious dental abscess or gum swelling   Eyes: Pupils are equal, round, and reactive to light.   Neck: Normal range of motion. Neck supple.   Cardiovascular: Normal rate and regular rhythm.    Pulmonary/Chest: Effort normal and breath sounds normal.   Abdominal: Bowel sounds are normal.   Musculoskeletal: Normal range of motion.   Lymphadenopathy:     He has no cervical adenopathy.   Neurological: He is alert and oriented to person, place, and time.   Skin: Skin is warm and dry.   Nursing note and vitals reviewed.       MDM    Procedures    Mouth care and emergency dental care referral information was reviewed with the pt. 6:52 PM  Patient's results and plan of care have been reviewed with him.  Patient and/or family have verbally conveyed their understanding and agreement of the patient's signs, symptoms, diagnosis, treatment and prognosis and additionally agree to follow up as recommended or return to the Emergency  Room should his condition change prior to follow-up.  Discharge instructions have also been provided to the patient with some educational information regarding his diagnosis as well a list of reasons why he would want to return to the ER prior to his follow-up appointment should his condition change.Yetta Numbers, NP

## 2014-04-03 NOTE — ED Notes (Signed)
Patient verbalizes understanding of discharge instructions. Ambulatory and in no acute distress at discharge.

## 2020-06-19 ENCOUNTER — Ambulatory Visit: Payer: Self-pay | Admitting: Medical

## 2020-07-28 DIAGNOSIS — Z992 Dependence on renal dialysis: Secondary | ICD-10-CM | POA: Diagnosis not present

## 2020-07-28 DIAGNOSIS — N186 End stage renal disease: Secondary | ICD-10-CM | POA: Diagnosis not present

## 2020-07-29 DIAGNOSIS — N186 End stage renal disease: Secondary | ICD-10-CM | POA: Diagnosis not present

## 2020-07-29 DIAGNOSIS — Z992 Dependence on renal dialysis: Secondary | ICD-10-CM | POA: Diagnosis not present

## 2020-07-30 DIAGNOSIS — Z992 Dependence on renal dialysis: Secondary | ICD-10-CM | POA: Diagnosis not present

## 2020-07-30 DIAGNOSIS — N186 End stage renal disease: Secondary | ICD-10-CM | POA: Diagnosis not present

## 2020-07-31 DIAGNOSIS — N186 End stage renal disease: Secondary | ICD-10-CM | POA: Diagnosis not present

## 2020-07-31 DIAGNOSIS — Z992 Dependence on renal dialysis: Secondary | ICD-10-CM | POA: Diagnosis not present

## 2020-08-01 DIAGNOSIS — N186 End stage renal disease: Secondary | ICD-10-CM | POA: Diagnosis not present

## 2020-08-01 DIAGNOSIS — Z992 Dependence on renal dialysis: Secondary | ICD-10-CM | POA: Diagnosis not present

## 2020-08-02 DIAGNOSIS — N186 End stage renal disease: Secondary | ICD-10-CM | POA: Diagnosis not present

## 2020-08-02 DIAGNOSIS — Z992 Dependence on renal dialysis: Secondary | ICD-10-CM | POA: Diagnosis not present

## 2020-08-03 DIAGNOSIS — Z992 Dependence on renal dialysis: Secondary | ICD-10-CM | POA: Diagnosis not present

## 2020-08-03 DIAGNOSIS — N186 End stage renal disease: Secondary | ICD-10-CM | POA: Diagnosis not present

## 2020-08-04 DIAGNOSIS — N186 End stage renal disease: Secondary | ICD-10-CM | POA: Diagnosis not present

## 2020-08-04 DIAGNOSIS — Z992 Dependence on renal dialysis: Secondary | ICD-10-CM | POA: Diagnosis not present

## 2020-08-05 DIAGNOSIS — N186 End stage renal disease: Secondary | ICD-10-CM | POA: Diagnosis not present

## 2020-08-05 DIAGNOSIS — Z992 Dependence on renal dialysis: Secondary | ICD-10-CM | POA: Diagnosis not present

## 2020-08-06 DIAGNOSIS — Z992 Dependence on renal dialysis: Secondary | ICD-10-CM | POA: Diagnosis not present

## 2020-08-06 DIAGNOSIS — N186 End stage renal disease: Secondary | ICD-10-CM | POA: Diagnosis not present

## 2020-08-07 DIAGNOSIS — N186 End stage renal disease: Secondary | ICD-10-CM | POA: Diagnosis not present

## 2020-08-07 DIAGNOSIS — Z992 Dependence on renal dialysis: Secondary | ICD-10-CM | POA: Diagnosis not present

## 2020-08-08 DIAGNOSIS — Z992 Dependence on renal dialysis: Secondary | ICD-10-CM | POA: Diagnosis not present

## 2020-08-08 DIAGNOSIS — N186 End stage renal disease: Secondary | ICD-10-CM | POA: Diagnosis not present

## 2020-08-09 DIAGNOSIS — N186 End stage renal disease: Secondary | ICD-10-CM | POA: Diagnosis not present

## 2020-08-09 DIAGNOSIS — Z992 Dependence on renal dialysis: Secondary | ICD-10-CM | POA: Diagnosis not present

## 2020-08-10 DIAGNOSIS — N186 End stage renal disease: Secondary | ICD-10-CM | POA: Diagnosis not present

## 2020-08-10 DIAGNOSIS — Z992 Dependence on renal dialysis: Secondary | ICD-10-CM | POA: Diagnosis not present

## 2020-08-11 DIAGNOSIS — Z992 Dependence on renal dialysis: Secondary | ICD-10-CM | POA: Diagnosis not present

## 2020-08-11 DIAGNOSIS — N186 End stage renal disease: Secondary | ICD-10-CM | POA: Diagnosis not present

## 2020-08-12 DIAGNOSIS — Z992 Dependence on renal dialysis: Secondary | ICD-10-CM | POA: Diagnosis not present

## 2020-08-12 DIAGNOSIS — N186 End stage renal disease: Secondary | ICD-10-CM | POA: Diagnosis not present

## 2020-08-13 DIAGNOSIS — N186 End stage renal disease: Secondary | ICD-10-CM | POA: Diagnosis not present

## 2020-08-13 DIAGNOSIS — Z992 Dependence on renal dialysis: Secondary | ICD-10-CM | POA: Diagnosis not present

## 2020-08-14 DIAGNOSIS — N186 End stage renal disease: Secondary | ICD-10-CM | POA: Diagnosis not present

## 2020-08-14 DIAGNOSIS — Z992 Dependence on renal dialysis: Secondary | ICD-10-CM | POA: Diagnosis not present

## 2020-08-15 DIAGNOSIS — Z992 Dependence on renal dialysis: Secondary | ICD-10-CM | POA: Diagnosis not present

## 2020-08-15 DIAGNOSIS — N186 End stage renal disease: Secondary | ICD-10-CM | POA: Diagnosis not present

## 2020-08-16 DIAGNOSIS — Z992 Dependence on renal dialysis: Secondary | ICD-10-CM | POA: Diagnosis not present

## 2020-08-16 DIAGNOSIS — N186 End stage renal disease: Secondary | ICD-10-CM | POA: Diagnosis not present

## 2020-08-17 DIAGNOSIS — N186 End stage renal disease: Secondary | ICD-10-CM | POA: Diagnosis not present

## 2020-08-17 DIAGNOSIS — Z992 Dependence on renal dialysis: Secondary | ICD-10-CM | POA: Diagnosis not present

## 2020-08-18 DIAGNOSIS — Z992 Dependence on renal dialysis: Secondary | ICD-10-CM | POA: Diagnosis not present

## 2020-08-18 DIAGNOSIS — N186 End stage renal disease: Secondary | ICD-10-CM | POA: Diagnosis not present

## 2020-08-19 DIAGNOSIS — N186 End stage renal disease: Secondary | ICD-10-CM | POA: Diagnosis not present

## 2020-08-19 DIAGNOSIS — Z992 Dependence on renal dialysis: Secondary | ICD-10-CM | POA: Diagnosis not present

## 2020-08-20 ENCOUNTER — Encounter (HOSPITAL_BASED_OUTPATIENT_CLINIC_OR_DEPARTMENT_OTHER): Payer: Self-pay | Admitting: Emergency Medicine

## 2020-08-20 ENCOUNTER — Other Ambulatory Visit: Payer: Self-pay

## 2020-08-20 ENCOUNTER — Emergency Department (HOSPITAL_BASED_OUTPATIENT_CLINIC_OR_DEPARTMENT_OTHER)
Admission: EM | Admit: 2020-08-20 | Discharge: 2020-08-20 | Disposition: A | Discharge status: Home / Self Care | Payer: Medicare HMO | Attending: Emergency Medicine | Admitting: Emergency Medicine

## 2020-08-20 DIAGNOSIS — S9032XA Contusion of left foot, initial encounter: Secondary | ICD-10-CM | POA: Insufficient documentation

## 2020-08-20 DIAGNOSIS — X58XXXA Exposure to other specified factors, initial encounter: Secondary | ICD-10-CM | POA: Insufficient documentation

## 2020-08-20 DIAGNOSIS — F17200 Nicotine dependence, unspecified, uncomplicated: Secondary | ICD-10-CM | POA: Diagnosis not present

## 2020-08-20 DIAGNOSIS — R69 Illness, unspecified: Secondary | ICD-10-CM | POA: Diagnosis not present

## 2020-08-20 DIAGNOSIS — Z992 Dependence on renal dialysis: Secondary | ICD-10-CM | POA: Diagnosis not present

## 2020-08-20 DIAGNOSIS — S9030XA Contusion of unspecified foot, initial encounter: Secondary | ICD-10-CM

## 2020-08-20 DIAGNOSIS — S9031XA Contusion of right foot, initial encounter: Secondary | ICD-10-CM | POA: Insufficient documentation

## 2020-08-20 DIAGNOSIS — N186 End stage renal disease: Secondary | ICD-10-CM | POA: Diagnosis not present

## 2020-08-20 DIAGNOSIS — I10 Essential (primary) hypertension: Secondary | ICD-10-CM | POA: Diagnosis not present

## 2020-08-20 DIAGNOSIS — S99921A Unspecified injury of right foot, initial encounter: Secondary | ICD-10-CM | POA: Diagnosis not present

## 2020-08-20 HISTORY — DX: Cerebral infarction, unspecified: I63.9

## 2020-08-20 HISTORY — DX: Unspecified kidney failure: N19

## 2020-08-20 HISTORY — DX: Dependence on renal dialysis: Z99.2

## 2020-08-20 HISTORY — DX: Essential (primary) hypertension: I10

## 2020-08-20 NOTE — ED Notes (Signed)
Patient reports bilateral boot pain to the lateral bottom of both feet, callus present on both feet.

## 2020-08-20 NOTE — ED Triage Notes (Signed)
Pt having bilateral foot pain since October.  Pt states the pain has progressively worsened.

## 2020-08-20 NOTE — ED Provider Notes (Signed)
[place] EMERGENCY DEPARTMENT Provider Note   CSN: [id number] Arrival date & time: 08/20/20  1003     History Chief Complaint  Patient presents with  . Foot Pain    Derrick Moore is a 55 y.o. male.  The history is provided by the patient.  Foot Pain This is a chronic problem. The current episode started more than 1 week ago. The problem occurs every several days. The problem has not changed since onset.Nothing aggravates the symptoms. Nothing relieves the symptoms. He has tried nothing for the symptoms. The treatment provided no relief.       Past Medical History:  Diagnosis Date  . Dialysis patient ([place])   . Hypertension   . Renal failure   . Stroke [facility])     There are no problems to display for this patient.   History reviewed. No pertinent surgical history.     No family history on file.  Social History   Tobacco Use  . Smoking status: Current Some Day Smoker  . Smokeless tobacco: Never Used    Home Medications Prior to Admission medications   Not on File    Allergies    No known allergies  Review of Systems   Review of Systems  Constitutional: Negative for fever.  Musculoskeletal: Positive for arthralgias. Negative for back pain, gait problem, joint swelling, myalgias, neck pain and neck stiffness.  Skin: Negative for color change, pallor, rash and wound.    Physical Exam Updated Vital Signs BP (!) 122/93 (BP Location: Right Arm)   Pulse 78   Temp 97.9 F (36.6 C)   Resp 16   Ht '5\' 10"'$  (1.778 m)   Wt 64 kg   SpO2 99%   BMI 20.24 kg/m   Physical Exam Cardiovascular:     Pulses: Normal pulses.  Musculoskeletal:        General: Tenderness present.     Comments: Tenderness over hard callus to the base of the pinkies bilaterally in his feet  Skin:    General: Skin is warm.     Capillary Refill: Capillary refill takes less than 2 seconds.  Neurological:     Sensory: No sensory deficit.     Motor: No weakness.      ED Results / Procedures / Treatments   Labs (all labs ordered are listed, but only abnormal results are displayed) Labs Reviewed - No data to display  EKG None  Radiology No results found.  Procedures Procedures   Medications Ordered in ED Medications - No data to display  ED Course  I have reviewed the triage vital signs and the nursing notes.  Pertinent labs & imaging results that were available during my care of the patient were reviewed by me and considered in my medical decision making (see chart for details).    MDM Rules/Calculators/A&P                          Derrick Moore is a 55 year old male with history of stroke who presents to the ED with bilateral foot pain.  This has been ongoing chronic problem for several months.  Overall he has hard callus to the base of his fifth metatarsal bilaterally.  This is likely due to overuse/wearing hard soled shoes like boots which he often does.  Recommend pumice stone, Tylenol Motrin, more comfortable shoes.  Will refer him to podiatry.  Discharged in good condition.  No concern for infectious process or  fracture.  This chart was dictated using voice recognition software.  Despite best efforts to proofread,  errors can occur which can change the documentation meaning.   Final Clinical Impression(s) / ED Diagnoses Final diagnoses:  Contusion of foot, unspecified laterality, initial encounter    Rx / DC Orders ED Discharge Orders    None       Lennice Sites, DO 08/20/20 1140

## 2020-08-20 NOTE — Discharge Instructions (Signed)
Overall suspect that you have acute on chronic bone bruise to both feet likely due to wearing some narrow shoes.  You have hard calluses on the bottom of your feet that are likely causing discomfort.  Use pumice stone as we discussed.  Follow-up with your primary care doctor.  Podiatry may be helpful as well.

## 2020-08-21 DIAGNOSIS — Z992 Dependence on renal dialysis: Secondary | ICD-10-CM | POA: Diagnosis not present

## 2020-08-21 DIAGNOSIS — D509 Iron deficiency anemia, unspecified: Secondary | ICD-10-CM | POA: Diagnosis not present

## 2020-08-21 DIAGNOSIS — N186 End stage renal disease: Secondary | ICD-10-CM | POA: Diagnosis not present

## 2020-08-22 DIAGNOSIS — N186 End stage renal disease: Secondary | ICD-10-CM | POA: Diagnosis not present

## 2020-08-22 DIAGNOSIS — Z992 Dependence on renal dialysis: Secondary | ICD-10-CM | POA: Diagnosis not present

## 2020-08-23 DIAGNOSIS — M21621 Bunionette of right foot: Secondary | ICD-10-CM | POA: Diagnosis not present

## 2020-08-23 DIAGNOSIS — M21622 Bunionette of left foot: Secondary | ICD-10-CM | POA: Diagnosis not present

## 2020-08-23 DIAGNOSIS — M79672 Pain in left foot: Secondary | ICD-10-CM | POA: Diagnosis not present

## 2020-08-23 DIAGNOSIS — M79671 Pain in right foot: Secondary | ICD-10-CM | POA: Diagnosis not present

## 2020-08-23 DIAGNOSIS — Z992 Dependence on renal dialysis: Secondary | ICD-10-CM | POA: Diagnosis not present

## 2020-08-23 DIAGNOSIS — N186 End stage renal disease: Secondary | ICD-10-CM | POA: Diagnosis not present

## 2020-08-23 DIAGNOSIS — M7752 Other enthesopathy of left foot: Secondary | ICD-10-CM | POA: Diagnosis not present

## 2020-08-23 DIAGNOSIS — M7751 Other enthesopathy of right foot: Secondary | ICD-10-CM | POA: Diagnosis not present

## 2020-08-24 DIAGNOSIS — N186 End stage renal disease: Secondary | ICD-10-CM | POA: Diagnosis not present

## 2020-08-24 DIAGNOSIS — Z992 Dependence on renal dialysis: Secondary | ICD-10-CM | POA: Diagnosis not present

## 2020-08-25 DIAGNOSIS — N186 End stage renal disease: Secondary | ICD-10-CM | POA: Diagnosis not present

## 2020-08-25 DIAGNOSIS — Z992 Dependence on renal dialysis: Secondary | ICD-10-CM | POA: Diagnosis not present

## 2020-08-26 DIAGNOSIS — Z992 Dependence on renal dialysis: Secondary | ICD-10-CM | POA: Diagnosis not present

## 2020-08-26 DIAGNOSIS — N186 End stage renal disease: Secondary | ICD-10-CM | POA: Diagnosis not present

## 2020-08-27 ENCOUNTER — Other Ambulatory Visit: Payer: Self-pay

## 2020-08-27 ENCOUNTER — Ambulatory Visit (INDEPENDENT_AMBULATORY_CARE_PROVIDER_SITE_OTHER): Payer: Medicare HMO | Admitting: Medical

## 2020-08-27 DIAGNOSIS — Z992 Dependence on renal dialysis: Secondary | ICD-10-CM | POA: Diagnosis not present

## 2020-08-27 DIAGNOSIS — N186 End stage renal disease: Secondary | ICD-10-CM | POA: Diagnosis not present

## 2020-08-27 NOTE — Progress Notes (Signed)
   Subjective:    Patient ID: Derrick Moore, male    DOB: [birth year], 55 y.o.   MRN: [id number]  HPI Pt showed up late more than 20 minutes. Needs to reschedule   Review of Systems     Objective:   Physical Exam        Assessment & Plan:

## 2020-08-28 DIAGNOSIS — N186 End stage renal disease: Secondary | ICD-10-CM | POA: Diagnosis not present

## 2020-08-28 DIAGNOSIS — Z992 Dependence on renal dialysis: Secondary | ICD-10-CM | POA: Diagnosis not present

## 2020-08-29 DIAGNOSIS — N186 End stage renal disease: Secondary | ICD-10-CM | POA: Diagnosis not present

## 2020-08-29 DIAGNOSIS — Z992 Dependence on renal dialysis: Secondary | ICD-10-CM | POA: Diagnosis not present

## 2020-08-30 DIAGNOSIS — N186 End stage renal disease: Secondary | ICD-10-CM | POA: Diagnosis not present

## 2020-08-30 DIAGNOSIS — Z992 Dependence on renal dialysis: Secondary | ICD-10-CM | POA: Diagnosis not present

## 2020-08-31 DIAGNOSIS — Z992 Dependence on renal dialysis: Secondary | ICD-10-CM | POA: Diagnosis not present

## 2020-08-31 DIAGNOSIS — N186 End stage renal disease: Secondary | ICD-10-CM | POA: Diagnosis not present

## 2020-09-01 DIAGNOSIS — Z992 Dependence on renal dialysis: Secondary | ICD-10-CM | POA: Diagnosis not present

## 2020-09-01 DIAGNOSIS — N186 End stage renal disease: Secondary | ICD-10-CM | POA: Diagnosis not present

## 2020-09-02 DIAGNOSIS — N186 End stage renal disease: Secondary | ICD-10-CM | POA: Diagnosis not present

## 2020-09-02 DIAGNOSIS — Z992 Dependence on renal dialysis: Secondary | ICD-10-CM | POA: Diagnosis not present

## 2020-09-03 DIAGNOSIS — Z992 Dependence on renal dialysis: Secondary | ICD-10-CM | POA: Diagnosis not present

## 2020-09-03 DIAGNOSIS — N186 End stage renal disease: Secondary | ICD-10-CM | POA: Diagnosis not present

## 2020-09-04 DIAGNOSIS — Z992 Dependence on renal dialysis: Secondary | ICD-10-CM | POA: Diagnosis not present

## 2020-09-04 DIAGNOSIS — N186 End stage renal disease: Secondary | ICD-10-CM | POA: Diagnosis not present

## 2020-09-05 DIAGNOSIS — Z992 Dependence on renal dialysis: Secondary | ICD-10-CM | POA: Diagnosis not present

## 2020-09-05 DIAGNOSIS — N186 End stage renal disease: Secondary | ICD-10-CM | POA: Diagnosis not present

## 2020-09-06 DIAGNOSIS — N186 End stage renal disease: Secondary | ICD-10-CM | POA: Diagnosis not present

## 2020-09-06 DIAGNOSIS — Z992 Dependence on renal dialysis: Secondary | ICD-10-CM | POA: Diagnosis not present

## 2020-09-06 DIAGNOSIS — M25571 Pain in right ankle and joints of right foot: Secondary | ICD-10-CM | POA: Diagnosis not present

## 2020-09-06 DIAGNOSIS — M25572 Pain in left ankle and joints of left foot: Secondary | ICD-10-CM | POA: Diagnosis not present

## 2020-09-06 DIAGNOSIS — L600 Ingrowing nail: Secondary | ICD-10-CM | POA: Diagnosis not present

## 2020-09-06 DIAGNOSIS — M722 Plantar fascial fibromatosis: Secondary | ICD-10-CM | POA: Diagnosis not present

## 2020-09-07 DIAGNOSIS — N186 End stage renal disease: Secondary | ICD-10-CM | POA: Diagnosis not present

## 2020-09-07 DIAGNOSIS — Z992 Dependence on renal dialysis: Secondary | ICD-10-CM | POA: Diagnosis not present

## 2020-09-08 DIAGNOSIS — N186 End stage renal disease: Secondary | ICD-10-CM | POA: Diagnosis not present

## 2020-09-08 DIAGNOSIS — Z992 Dependence on renal dialysis: Secondary | ICD-10-CM | POA: Diagnosis not present

## 2020-09-09 DIAGNOSIS — Z992 Dependence on renal dialysis: Secondary | ICD-10-CM | POA: Diagnosis not present

## 2020-09-09 DIAGNOSIS — N186 End stage renal disease: Secondary | ICD-10-CM | POA: Diagnosis not present

## 2020-09-10 ENCOUNTER — Other Ambulatory Visit: Payer: Self-pay

## 2020-09-10 DIAGNOSIS — N186 End stage renal disease: Secondary | ICD-10-CM | POA: Diagnosis not present

## 2020-09-10 DIAGNOSIS — Z992 Dependence on renal dialysis: Secondary | ICD-10-CM | POA: Diagnosis not present

## 2020-09-11 ENCOUNTER — Ambulatory Visit (INDEPENDENT_AMBULATORY_CARE_PROVIDER_SITE_OTHER): Payer: Medicare HMO | Admitting: Medical

## 2020-09-11 ENCOUNTER — Encounter: Payer: Self-pay | Admitting: Medical

## 2020-09-11 VITALS — BP 110/65 | HR 77 | Temp 98.1°F | Resp 20 | Ht 70.0 in | Wt 156.0 lb

## 2020-09-11 DIAGNOSIS — I10 Essential (primary) hypertension: Secondary | ICD-10-CM | POA: Diagnosis not present

## 2020-09-11 DIAGNOSIS — I1311 Hypertensive heart and chronic kidney disease without heart failure, with stage 5 chronic kidney disease, or end stage renal disease: Secondary | ICD-10-CM

## 2020-09-11 DIAGNOSIS — E785 Hyperlipidemia, unspecified: Secondary | ICD-10-CM

## 2020-09-11 DIAGNOSIS — D649 Anemia, unspecified: Secondary | ICD-10-CM

## 2020-09-11 DIAGNOSIS — F32A Depression, unspecified: Secondary | ICD-10-CM

## 2020-09-11 DIAGNOSIS — Z992 Dependence on renal dialysis: Secondary | ICD-10-CM | POA: Diagnosis not present

## 2020-09-11 DIAGNOSIS — R69 Illness, unspecified: Secondary | ICD-10-CM | POA: Diagnosis not present

## 2020-09-11 DIAGNOSIS — N186 End stage renal disease: Secondary | ICD-10-CM | POA: Diagnosis not present

## 2020-09-11 DIAGNOSIS — Z7185 Encounter for immunization safety counseling: Secondary | ICD-10-CM | POA: Diagnosis not present

## 2020-09-11 DIAGNOSIS — Z125 Encounter for screening for malignant neoplasm of prostate: Secondary | ICD-10-CM | POA: Diagnosis not present

## 2020-09-11 DIAGNOSIS — L709 Acne, unspecified: Secondary | ICD-10-CM | POA: Diagnosis not present

## 2020-09-11 DIAGNOSIS — F17200 Nicotine dependence, unspecified, uncomplicated: Secondary | ICD-10-CM

## 2020-09-11 MED ORDER — SILDENAFIL CITRATE 50 MG PO TABS: 50.0000 mg | ORAL_TABLET | Freq: Every day | ORAL | 0 refills | Status: DC | PRN

## 2020-09-11 NOTE — Patient Instructions (Addendum)
History of hypertension your blood pressure is controlled today.  Continue lisinopril 30 mg daily, labetalol 200 mg twice daily, hydralazine 50 mg twice daily and amlodipine 5 mg at night.  For history of stroke Plavix.  For history of hyperlipidemia continue Pravachol.  Place in future lipid panel to be done fasting.  History of CKD stage V.  Continue with dialysis and continue follow-up with nephrologist.  History of anemia in place future CBC to assess anemia level when future fasting labs done.  Vaccine counseling regarding risk complications with Covid infections.  Consider getting vaccinated.  History of acne.  Went ahead and place referral to dermatologist.  History of depression.  Did place referral to behavioral health.  Consider use of sertraline.  I can prescribe that if you want.  If you have any thoughts of harm to self or others then be seen at [facility] long ED.  Do recommend try to quit smoking.  For ED rx of viagra given. Rx advisement given  Follow-up in 1 month or as needed.   [name], PA-C .

## 2020-09-11 NOTE — Progress Notes (Signed)
Subjective:    Patient ID: Derrick Moore, male    DOB: [birth year], 55 y.o.   MRN: [id number]  HPI  Pt in for first time.  Pt states born and raised in [place]. Pt state move back to [place] in September  2021. Pt on disability. Works part time at [employer]. Smokes 5 cigarretes a day. Smokes since 1988. Drinks 12 pack every 2 weeks.   Pt states had stroke in 2012 which let to disability. 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  Pt states after his stroke had speech difficulty and rt sided weakness. He gradually recovered. Sometimes seems to have hard time finding words. He states when whether real cold has some tight muscles on rt upper and lower extremity.Pt is still on plavix.  Pt has hx of anemia.  Hx of htn. Lisinopril 30 mg at night. labetolol 200 mg bid. Hydrazine 50 mg bid.  On amlodipine 5 mg at bed time.  Hx of high cholesterol as well. He is on pravachol 10 mg daily.  Pt has hx of renal failure. He started dialysis in 2016. Pt goes to [facility] in [place]. Pt states he does dialysis at home. Sees [facility] twice a month. 1st week of month does labs.  Pt never had colonoscopy. He is willing to get.  Pt never had covid vaccines. Counseled on vaccine today.  Hx of premature ejaculations. States getting over a divorce.   Review of Systems  Constitutional: Negative for chills, diaphoresis, fatigue and fever.  HENT: Negative for congestion.   Respiratory: Negative for cough, choking, chest tightness, shortness of breath and wheezing.   Cardiovascular: Negative for chest pain and palpitations.  Gastrointestinal: Negative for abdominal pain, blood in stool, constipation, diarrhea, nausea and vomiting.  Genitourinary: Negative for decreased urine volume, dysuria, flank pain, frequency, penile swelling and testicular pain.       ED and some premature ejaculation.  Musculoskeletal: Negative for back pain,  joint swelling, myalgias and neck stiffness.  Skin: Negative for rash.  Neurological: Negative for dizziness, seizures, speech difficulty, weakness, light-headedness and headaches.  Hematological: Negative for adenopathy. Does not bruise/bleed easily.  Psychiatric/Behavioral: Positive for dysphoric mood. Negative for behavioral problems, decreased concentration, hallucinations, self-injury and suicidal ideas. The patient is not nervous/anxious.        No thought of harm to self or others but states in past when found out about wife cheating on him.    Past Medical History:  Diagnosis Date  . Dialysis patient ([place])   . Hypertension   . Renal failure   . Stroke [facility])      Social History   Socioeconomic History  . Marital status: Single    Spouse name: Not on file  . Number of children: Not on file  . Years of education: Not on file  . Highest education level: Not on file  Occupational History  . Not on file  Tobacco Use  . Smoking status: Current Some Day Smoker  . Smokeless tobacco: Never Used  Substance and Sexual Activity  . Alcohol use: Not on file  . Drug use: Not on file  . Sexual activity: Not on file  Other Topics Concern  . Not on file  Social History Narrative  . Not on file   Social Determinants of Health   Financial Resource Strain: Not on file  Food Insecurity: Not on file  Transportation Needs: Not on file  Physical Activity: Not on file  Stress: Not on file  Social Connections: Not on file  Intimate Partner Violence: Not on file    No past surgical history on file.  No family history on file.  Allergies  Allergen Reactions  . No Known Allergies     Current Outpatient Medications on File Prior to Visit  Medication Sig Dispense Refill  . amLODipine (NORVASC) 5 MG tablet Take 5 mg by mouth at bedtime.    . clopidogrel (PLAVIX) 75 MG tablet Take 75 mg by mouth daily.    . hydrALAZINE (APRESOLINE) 50 MG tablet Take 50 mg by mouth 2 (two) times  daily.    . JUBLIA 10 % SOLN Apply topically.    [name] labetalol (NORMODYNE) 200 MG tablet Take 200 mg by mouth 2 (two) times daily.    [name] LASIX 40 MG tablet Take 40 mg by mouth every morning.    [name] lisinopril (ZESTRIL) 30 MG tablet Take 30 mg by mouth at bedtime.    . naproxen (NAPROSYN) 500 MG tablet Take 500 mg by mouth 2 (two) times daily.    . pravastatin (PRAVACHOL) 10 MG tablet Take 10 mg by mouth daily.    [name] RAYOS 5 MG TBEC Take 1 tablet by mouth daily.     No current facility-administered medications on file prior to visit.    BP (!) 133/94   Pulse 77   Temp 98.1 F (36.7 C) (Oral)   Resp 20   Ht '5\' 10"'$  (1.778 m)   Wt 156 lb (70.8 kg)   SpO2 92%   BMI 22.38 kg/m       Objective:   Physical Exam   General Mental Status- Alert. General Appearance- Not in acute distress.   Skin Scattered acne throughout her back.  Neck Carotid Arteries- Normal color. Moisture- Normal Moisture. No carotid bruits. No JVD.  Chest and Lung Exam Auscultation: Breath Sounds:-Normal.  Cardiovascular Auscultation:Rythm- Regular. Murmurs & Other Heart Sounds:Auscultation of the heart reveals- No Murmurs.  Abdomen Inspection:-Inspeection Normal. Palpation/Percussion:Note:No mass. Palpation and Percussion of the abdomen reveal- Non Tender, Non Distended + BS, no rebound or guarding.   Neurologic Cranial Nerve exam:- CN III-XII intact(No nystagmus), symmetric smile.       Assessment & Plan:  History of hypertension your blood pressure is controlled today.  Continue lisinopril 30 mg daily, labetalol 200 mg twice daily, hydralazine 50 mg twice daily and amlodipine 5 mg at night.  For history of stroke Plavix.  For history of hyperlipidemia continue Pravachol.  Place in future lipid panel to be done fasting.  History of CKD stage V.  Continue with dialysis and continue follow-up with nephrologist.  History of anemia in place future CBC to assess anemia level when future fasting labs  done.  Vaccine counseling regarding risk complications with Covid infections.  Consider getting vaccinated.  History of acne.  Went ahead and place referral to dermatologist.  History of depression.  Did place referral to behavioral health.  Consider use of sertraline.  I can prescribe that if you want.  If you have any thoughts of harm to self or others then be seen at [facility] long ED.  Do recommend try to quit smoking.  Follow-up in 1 month or as needed.  Placed referral for screening colonoscopy.  Time spent with patient today was  45 + minutes which consisted of chart review, discussing diagnoses, work up treatment and documentation.

## 2020-09-12 DIAGNOSIS — Z992 Dependence on renal dialysis: Secondary | ICD-10-CM | POA: Diagnosis not present

## 2020-09-12 DIAGNOSIS — N186 End stage renal disease: Secondary | ICD-10-CM | POA: Diagnosis not present

## 2020-09-13 DIAGNOSIS — Z992 Dependence on renal dialysis: Secondary | ICD-10-CM | POA: Diagnosis not present

## 2020-09-13 DIAGNOSIS — N186 End stage renal disease: Secondary | ICD-10-CM | POA: Diagnosis not present

## 2020-09-14 DIAGNOSIS — Z992 Dependence on renal dialysis: Secondary | ICD-10-CM | POA: Diagnosis not present

## 2020-09-14 DIAGNOSIS — N186 End stage renal disease: Secondary | ICD-10-CM | POA: Diagnosis not present

## 2020-09-15 DIAGNOSIS — Z992 Dependence on renal dialysis: Secondary | ICD-10-CM | POA: Diagnosis not present

## 2020-09-15 DIAGNOSIS — N186 End stage renal disease: Secondary | ICD-10-CM | POA: Diagnosis not present

## 2020-09-16 DIAGNOSIS — Z992 Dependence on renal dialysis: Secondary | ICD-10-CM | POA: Diagnosis not present

## 2020-09-16 DIAGNOSIS — N186 End stage renal disease: Secondary | ICD-10-CM | POA: Diagnosis not present

## 2020-09-17 DIAGNOSIS — N186 End stage renal disease: Secondary | ICD-10-CM | POA: Diagnosis not present

## 2020-09-17 DIAGNOSIS — Z992 Dependence on renal dialysis: Secondary | ICD-10-CM | POA: Diagnosis not present

## 2020-09-18 ENCOUNTER — Other Ambulatory Visit: Payer: Self-pay

## 2020-09-18 ENCOUNTER — Other Ambulatory Visit (INDEPENDENT_AMBULATORY_CARE_PROVIDER_SITE_OTHER): Payer: Medicare HMO

## 2020-09-18 DIAGNOSIS — Z125 Encounter for screening for malignant neoplasm of prostate: Secondary | ICD-10-CM

## 2020-09-18 DIAGNOSIS — D649 Anemia, unspecified: Secondary | ICD-10-CM | POA: Diagnosis not present

## 2020-09-18 DIAGNOSIS — I10 Essential (primary) hypertension: Secondary | ICD-10-CM | POA: Diagnosis not present

## 2020-09-18 DIAGNOSIS — N186 End stage renal disease: Secondary | ICD-10-CM | POA: Diagnosis not present

## 2020-09-18 DIAGNOSIS — Z992 Dependence on renal dialysis: Secondary | ICD-10-CM | POA: Diagnosis not present

## 2020-09-18 DIAGNOSIS — E785 Hyperlipidemia, unspecified: Secondary | ICD-10-CM

## 2020-09-18 LAB — LIPID PANEL
Cholesterol: 170 mg/dL (ref 0–200)
HDL: 66.9 mg/dL (ref >39.00)
LDL Cholesterol: 86 mg/dL (ref 0–99)
NonHDL: 102.99
Total CHOL/HDL Ratio: 3
Triglycerides: 86 mg/dL (ref 0.0–149.0)
VLDL: 17.2 mg/dL (ref 0.0–40.0)

## 2020-09-18 LAB — CBC WITH DIFFERENTIAL/PLATELET
Basophils Absolute: 0 10*3/uL (ref 0.0–0.1)
Basophils Relative: 0.9 % (ref 0.0–3.0)
Eosinophils Absolute: 0.2 10*3/uL (ref 0.0–0.7)
Eosinophils Relative: 3.9 % (ref 0.0–5.0)
HCT: 29 % — ABNORMAL LOW (ref 39.0–52.0)
Hemoglobin: 9.7 g/dL — ABNORMAL LOW (ref 13.0–17.0)
Lymphocytes Relative: 21.5 % (ref 12.0–46.0)
Lymphs Abs: 1.1 10*3/uL (ref 0.7–4.0)
MCHC: 33.6 g/dL (ref 30.0–36.0)
MCV: 100.4 fl — ABNORMAL HIGH (ref 78.0–100.0)
Monocytes Absolute: 0.5 10*3/uL (ref 0.1–1.0)
Monocytes Relative: 9.2 % (ref 3.0–12.0)
Neutro Abs: 3.2 10*3/uL (ref 1.4–7.7)
Neutrophils Relative %: 64.5 % (ref 43.0–77.0)
Platelets: 176 10*3/uL (ref 150.0–400.0)
RBC: 2.89 Mil/uL — ABNORMAL LOW (ref 4.22–5.81)
RDW: 17.6 % — ABNORMAL HIGH (ref 11.5–15.5)
WBC: 4.9 10*3/uL (ref 4.0–10.5)

## 2020-09-18 LAB — PSA: PSA: 0.63 ng/mL (ref 0.10–4.00)

## 2020-09-19 DIAGNOSIS — Z992 Dependence on renal dialysis: Secondary | ICD-10-CM | POA: Diagnosis not present

## 2020-09-19 DIAGNOSIS — N186 End stage renal disease: Secondary | ICD-10-CM | POA: Diagnosis not present

## 2020-09-20 DIAGNOSIS — Z992 Dependence on renal dialysis: Secondary | ICD-10-CM | POA: Diagnosis not present

## 2020-09-20 DIAGNOSIS — N186 End stage renal disease: Secondary | ICD-10-CM | POA: Diagnosis not present

## 2020-09-21 ENCOUNTER — Ambulatory Visit (INDEPENDENT_AMBULATORY_CARE_PROVIDER_SITE_OTHER): Payer: Medicare HMO | Admitting: Psychologist

## 2020-09-21 DIAGNOSIS — F331 Major depressive disorder, recurrent, moderate: Secondary | ICD-10-CM | POA: Diagnosis not present

## 2020-09-21 DIAGNOSIS — R69 Illness, unspecified: Secondary | ICD-10-CM | POA: Diagnosis not present

## 2020-09-21 DIAGNOSIS — N186 End stage renal disease: Secondary | ICD-10-CM | POA: Diagnosis not present

## 2020-09-21 DIAGNOSIS — Z992 Dependence on renal dialysis: Secondary | ICD-10-CM | POA: Diagnosis not present

## 2020-09-22 DIAGNOSIS — N186 End stage renal disease: Secondary | ICD-10-CM | POA: Diagnosis not present

## 2020-09-22 DIAGNOSIS — Z992 Dependence on renal dialysis: Secondary | ICD-10-CM | POA: Diagnosis not present

## 2020-09-23 DIAGNOSIS — N186 End stage renal disease: Secondary | ICD-10-CM | POA: Diagnosis not present

## 2020-09-23 DIAGNOSIS — Z992 Dependence on renal dialysis: Secondary | ICD-10-CM | POA: Diagnosis not present

## 2020-09-24 DIAGNOSIS — Z992 Dependence on renal dialysis: Secondary | ICD-10-CM | POA: Diagnosis not present

## 2020-09-24 DIAGNOSIS — N186 End stage renal disease: Secondary | ICD-10-CM | POA: Diagnosis not present

## 2020-09-25 DIAGNOSIS — N186 End stage renal disease: Secondary | ICD-10-CM | POA: Diagnosis not present

## 2020-09-25 DIAGNOSIS — Z992 Dependence on renal dialysis: Secondary | ICD-10-CM | POA: Diagnosis not present

## 2020-09-26 DIAGNOSIS — N186 End stage renal disease: Secondary | ICD-10-CM | POA: Diagnosis not present

## 2020-09-26 DIAGNOSIS — Z992 Dependence on renal dialysis: Secondary | ICD-10-CM | POA: Diagnosis not present

## 2020-09-27 ENCOUNTER — Other Ambulatory Visit: Payer: Self-pay

## 2020-09-27 ENCOUNTER — Telehealth: Payer: Self-pay | Admitting: Medical

## 2020-09-27 ENCOUNTER — Ambulatory Visit (INDEPENDENT_AMBULATORY_CARE_PROVIDER_SITE_OTHER): Payer: Medicare HMO | Admitting: Medical

## 2020-09-27 ENCOUNTER — Encounter: Payer: Self-pay | Admitting: Medical

## 2020-09-27 VITALS — BP 128/85 | HR 88 | Ht 70.0 in | Wt 156.8 lb

## 2020-09-27 DIAGNOSIS — D649 Anemia, unspecified: Secondary | ICD-10-CM

## 2020-09-27 DIAGNOSIS — Z1211 Encounter for screening for malignant neoplasm of colon: Secondary | ICD-10-CM

## 2020-09-27 DIAGNOSIS — I10 Essential (primary) hypertension: Secondary | ICD-10-CM | POA: Diagnosis not present

## 2020-09-27 DIAGNOSIS — E785 Hyperlipidemia, unspecified: Secondary | ICD-10-CM | POA: Diagnosis not present

## 2020-09-27 DIAGNOSIS — R69 Illness, unspecified: Secondary | ICD-10-CM | POA: Diagnosis not present

## 2020-09-27 DIAGNOSIS — N186 End stage renal disease: Secondary | ICD-10-CM

## 2020-09-27 DIAGNOSIS — Z992 Dependence on renal dialysis: Secondary | ICD-10-CM | POA: Diagnosis not present

## 2020-09-27 DIAGNOSIS — F32A Depression, unspecified: Secondary | ICD-10-CM

## 2020-09-27 LAB — CBC WITH DIFFERENTIAL/PLATELET
Basophils Absolute: 0 10*3/uL (ref 0.0–0.1)
Basophils Relative: 0.9 % (ref 0.0–3.0)
Eosinophils Absolute: 0.2 10*3/uL (ref 0.0–0.7)
Eosinophils Relative: 3.9 % (ref 0.0–5.0)
HCT: 29.4 % — ABNORMAL LOW (ref 39.0–52.0)
Hemoglobin: 9.8 g/dL — ABNORMAL LOW (ref 13.0–17.0)
Lymphocytes Relative: 18.1 % (ref 12.0–46.0)
Lymphs Abs: 0.9 10*3/uL (ref 0.7–4.0)
MCHC: 33.5 g/dL (ref 30.0–36.0)
MCV: 99.1 fl (ref 78.0–100.0)
Monocytes Absolute: 0.4 10*3/uL (ref 0.1–1.0)
Monocytes Relative: 7.8 % (ref 3.0–12.0)
Neutro Abs: 3.5 10*3/uL (ref 1.4–7.7)
Neutrophils Relative %: 69.3 % (ref 43.0–77.0)
Platelets: 133 10*3/uL — ABNORMAL LOW (ref 150.0–400.0)
RBC: 2.96 Mil/uL — ABNORMAL LOW (ref 4.22–5.81)
RDW: 17.7 % — ABNORMAL HIGH (ref 11.5–15.5)
WBC: 5 10*3/uL (ref 4.0–10.5)

## 2020-09-27 LAB — IRON: Iron: 153 ug/dL (ref 42–165)

## 2020-09-27 NOTE — Patient Instructions (Addendum)
Htn reasonably controlled today and forgot to take med so recommend continuing same bp med regimen.  For high cholesterol pravastatin 10 mg daily.  For ckd continue with dialysis.  For anemia will repeat cbc and add iron level.  Referral for screening colonoscopy placed.  For depression continue with behavioral health.  Follow up in 2 months or as needed

## 2020-09-27 NOTE — Progress Notes (Signed)
Subjective:    Patient ID: Derrick Moore, male    DOB: April 16, 1966, 55 y.o.   MRN: [id number]  HPI Pt in for follow up.   Hx of htn. He forgot to take bp med this morning.   Pt has hx of depression. Behavioral health called and  2nd appointment schedule in process. Seen  already once by video.   Pt has history of anemia. Pt thinks around time he started dialysis/2016. No black or bloody stools.    Review of Systems  Constitutional: Negative for chills, fatigue and fever.  HENT: Negative for congestion.   Respiratory: Negative for cough, chest tightness, shortness of breath and wheezing.   Cardiovascular: Negative for chest pain and palpitations.  Gastrointestinal: Negative for abdominal pain.  Genitourinary: Negative for dysuria and flank pain.  Musculoskeletal: Negative for back pain and neck pain.  Skin: Negative for rash.  Neurological: Negative for dizziness, weakness, numbness and headaches.  Hematological: Negative for adenopathy. Does not bruise/bleed easily.  Psychiatric/Behavioral: Positive for dysphoric mood. Negative for behavioral problems, sleep disturbance and suicidal ideas. The patient is not hyperactive.    Past Medical History:  Diagnosis Date  . Dialysis patient ([place])   . Hyperlipidemia   . Hypertension   . Renal failure   . Renal failure   . Stroke [facility])      Social History   Socioeconomic History  . Marital status: Single    Spouse name: Not on file  . Number of children: Not on file  . Years of education: Not on file  . Highest education level: Not on file  Occupational History  . Not on file  Tobacco Use  . Smoking status: Current Some Day Smoker    Packs/day: 0.25    Years: 33.00    Pack years: 8.25    Types: Cigarettes  . Smokeless tobacco: Never Used  Vaping Use  . Vaping Use: Never used  Substance and Sexual Activity  . Alcohol use: Yes    Alcohol/week: 1.0 standard drink    Types: 1 Cans of beer per week    Comment: 1 beer  a day at most on average. 12 pack in 2 weeks.  . Drug use: Yes    Frequency: 7.0 times per week    Types: Marijuana    Comment: smoke daily. helps his appetite.  . Sexual activity: Not on file  Other Topics Concern  . Not on file  Social History Narrative  . Not on file   Social Determinants of Health   Financial Resource Strain: Not on file  Food Insecurity: Not on file  Transportation Needs: Not on file  Physical Activity: Not on file  Stress: Not on file  Social Connections: Not on file  Intimate Partner Violence: Not on file    No past surgical history on file.  No family history on file.  Allergies  Allergen Reactions  . No Known Allergies     Current Outpatient Medications on File Prior to Visit  Medication Sig Dispense Refill  . amLODipine (NORVASC) 5 MG tablet Take 5 mg by mouth at bedtime.    . clopidogrel (PLAVIX) 75 MG tablet Take 75 mg by mouth daily.    . hydrALAZINE (APRESOLINE) 50 MG tablet Take 50 mg by mouth 2 (two) times daily.    . JUBLIA 10 % SOLN Apply topically.    [name] labetalol (NORMODYNE) 200 MG tablet Take 200 mg by mouth 2 (two) times daily.    [name] LASIX  40 MG tablet Take 40 mg by mouth every morning.    [name] lisinopril (ZESTRIL) 30 MG tablet Take 30 mg by mouth at bedtime.    . naproxen (NAPROSYN) 500 MG tablet Take 500 mg by mouth 2 (two) times daily.    . pravastatin (PRAVACHOL) 10 MG tablet Take 10 mg by mouth daily.    [name] RAYOS 5 MG TBEC Take 1 tablet by mouth daily.    . sildenafil (VIAGRA) 50 MG tablet Take 1 tablet (50 mg total) by mouth daily as needed for erectile dysfunction. 10 tablet 0   No current facility-administered medications on file prior to visit.    BP 128/85   Pulse 88   Ht '5\' 10"'$  (1.778 m)   Wt 156 lb 12.8 oz (71.1 kg)   SpO2 100%   BMI 22.50 kg/m       Objective:   Physical Exam  General Mental Status- Alert. General Appearance- Not in acute distress.   Skin General: Color- Normal Color. Moisture- Normal  Moisture.  Neck Carotid Arteries- Normal color. Moisture- Normal Moisture. No carotid bruits. No JVD.  Chest and Lung Exam Auscultation: Breath Sounds:-Normal.  Cardiovascular Auscultation:Rythm- Regular. Murmurs & Other Heart Sounds:Auscultation of the heart reveals- No Murmurs.  Abdomen Inspection:-Inspeection Normal. Palpation/Percussion:Note:No mass. Palpation and Percussion of the abdomen reveal- Non Tender, Non Distended + BS, no rebound or guarding.    Neurologic Cranial Nerve exam:- CN III-XII intact(No nystagmus), symmetric smile. Strength:- 5/5 equal and symmetric strength both upper and lower extremities.      Assessment & Plan:  Htn reasonably controlled today and forgot to take med so recommend continuing same bp med regimen.  For high cholesterol pravastatin 10 mg daily.  For ckd continue with dialysis.  For anemia will repeat cbc and add iron level.  Referral for screening colonoscopy placed.  For depression continue with behavioral health.  Follow up in 2 months or as needed  [employer]

## 2020-09-27 NOTE — Telephone Encounter (Signed)
Opened to review 

## 2020-09-28 DIAGNOSIS — Z992 Dependence on renal dialysis: Secondary | ICD-10-CM | POA: Diagnosis not present

## 2020-09-28 DIAGNOSIS — N186 End stage renal disease: Secondary | ICD-10-CM | POA: Diagnosis not present

## 2020-09-29 DIAGNOSIS — N186 End stage renal disease: Secondary | ICD-10-CM | POA: Diagnosis not present

## 2020-09-29 DIAGNOSIS — Z992 Dependence on renal dialysis: Secondary | ICD-10-CM | POA: Diagnosis not present

## 2020-09-30 DIAGNOSIS — N186 End stage renal disease: Secondary | ICD-10-CM | POA: Diagnosis not present

## 2020-09-30 DIAGNOSIS — Z992 Dependence on renal dialysis: Secondary | ICD-10-CM | POA: Diagnosis not present

## 2020-10-01 DIAGNOSIS — N186 End stage renal disease: Secondary | ICD-10-CM | POA: Diagnosis not present

## 2020-10-01 DIAGNOSIS — Z992 Dependence on renal dialysis: Secondary | ICD-10-CM | POA: Diagnosis not present

## 2020-10-02 ENCOUNTER — Ambulatory Visit: Payer: Medicare HMO | Admitting: Psychologist

## 2020-10-02 DIAGNOSIS — Z992 Dependence on renal dialysis: Secondary | ICD-10-CM | POA: Diagnosis not present

## 2020-10-02 DIAGNOSIS — N186 End stage renal disease: Secondary | ICD-10-CM | POA: Diagnosis not present

## 2020-10-03 ENCOUNTER — Encounter: Payer: Self-pay | Admitting: Gastroenterology

## 2020-10-03 DIAGNOSIS — N186 End stage renal disease: Secondary | ICD-10-CM | POA: Diagnosis not present

## 2020-10-03 DIAGNOSIS — Z992 Dependence on renal dialysis: Secondary | ICD-10-CM | POA: Diagnosis not present

## 2020-10-04 DIAGNOSIS — N186 End stage renal disease: Secondary | ICD-10-CM | POA: Diagnosis not present

## 2020-10-04 DIAGNOSIS — D509 Iron deficiency anemia, unspecified: Secondary | ICD-10-CM | POA: Diagnosis not present

## 2020-10-04 DIAGNOSIS — Z992 Dependence on renal dialysis: Secondary | ICD-10-CM | POA: Diagnosis not present

## 2020-10-05 DIAGNOSIS — N186 End stage renal disease: Secondary | ICD-10-CM | POA: Diagnosis not present

## 2020-10-05 DIAGNOSIS — Z992 Dependence on renal dialysis: Secondary | ICD-10-CM | POA: Diagnosis not present

## 2020-10-06 DIAGNOSIS — Z992 Dependence on renal dialysis: Secondary | ICD-10-CM | POA: Diagnosis not present

## 2020-10-06 DIAGNOSIS — N186 End stage renal disease: Secondary | ICD-10-CM | POA: Diagnosis not present

## 2020-10-07 DIAGNOSIS — Z992 Dependence on renal dialysis: Secondary | ICD-10-CM | POA: Diagnosis not present

## 2020-10-07 DIAGNOSIS — N186 End stage renal disease: Secondary | ICD-10-CM | POA: Diagnosis not present

## 2020-10-08 DIAGNOSIS — N186 End stage renal disease: Secondary | ICD-10-CM | POA: Diagnosis not present

## 2020-10-08 DIAGNOSIS — Z992 Dependence on renal dialysis: Secondary | ICD-10-CM | POA: Diagnosis not present

## 2020-10-09 DIAGNOSIS — Z992 Dependence on renal dialysis: Secondary | ICD-10-CM | POA: Diagnosis not present

## 2020-10-09 DIAGNOSIS — N186 End stage renal disease: Secondary | ICD-10-CM | POA: Diagnosis not present

## 2020-10-10 DIAGNOSIS — N186 End stage renal disease: Secondary | ICD-10-CM | POA: Diagnosis not present

## 2020-10-10 DIAGNOSIS — Z992 Dependence on renal dialysis: Secondary | ICD-10-CM | POA: Diagnosis not present

## 2020-10-11 DIAGNOSIS — Z992 Dependence on renal dialysis: Secondary | ICD-10-CM | POA: Diagnosis not present

## 2020-10-11 DIAGNOSIS — N186 End stage renal disease: Secondary | ICD-10-CM | POA: Diagnosis not present

## 2020-10-12 DIAGNOSIS — Z992 Dependence on renal dialysis: Secondary | ICD-10-CM | POA: Diagnosis not present

## 2020-10-12 DIAGNOSIS — N186 End stage renal disease: Secondary | ICD-10-CM | POA: Diagnosis not present

## 2020-10-13 DIAGNOSIS — Z992 Dependence on renal dialysis: Secondary | ICD-10-CM | POA: Diagnosis not present

## 2020-10-13 DIAGNOSIS — N186 End stage renal disease: Secondary | ICD-10-CM | POA: Diagnosis not present

## 2020-10-14 DIAGNOSIS — Z992 Dependence on renal dialysis: Secondary | ICD-10-CM | POA: Diagnosis not present

## 2020-10-14 DIAGNOSIS — N186 End stage renal disease: Secondary | ICD-10-CM | POA: Diagnosis not present

## 2020-10-15 DIAGNOSIS — Z992 Dependence on renal dialysis: Secondary | ICD-10-CM | POA: Diagnosis not present

## 2020-10-15 DIAGNOSIS — N186 End stage renal disease: Secondary | ICD-10-CM | POA: Diagnosis not present

## 2020-10-16 DIAGNOSIS — N186 End stage renal disease: Secondary | ICD-10-CM | POA: Diagnosis not present

## 2020-10-16 DIAGNOSIS — Z992 Dependence on renal dialysis: Secondary | ICD-10-CM | POA: Diagnosis not present

## 2020-10-17 DIAGNOSIS — Z992 Dependence on renal dialysis: Secondary | ICD-10-CM | POA: Diagnosis not present

## 2020-10-17 DIAGNOSIS — N186 End stage renal disease: Secondary | ICD-10-CM | POA: Diagnosis not present

## 2020-10-18 DIAGNOSIS — N186 End stage renal disease: Secondary | ICD-10-CM | POA: Diagnosis not present

## 2020-10-18 DIAGNOSIS — Z992 Dependence on renal dialysis: Secondary | ICD-10-CM | POA: Diagnosis not present

## 2020-10-19 DIAGNOSIS — Z992 Dependence on renal dialysis: Secondary | ICD-10-CM | POA: Diagnosis not present

## 2020-10-19 DIAGNOSIS — N186 End stage renal disease: Secondary | ICD-10-CM | POA: Diagnosis not present

## 2020-10-20 DIAGNOSIS — Z992 Dependence on renal dialysis: Secondary | ICD-10-CM | POA: Diagnosis not present

## 2020-10-20 DIAGNOSIS — N186 End stage renal disease: Secondary | ICD-10-CM | POA: Diagnosis not present

## 2020-10-21 DIAGNOSIS — N186 End stage renal disease: Secondary | ICD-10-CM | POA: Diagnosis not present

## 2020-10-21 DIAGNOSIS — Z992 Dependence on renal dialysis: Secondary | ICD-10-CM | POA: Diagnosis not present

## 2020-10-22 DIAGNOSIS — N186 End stage renal disease: Secondary | ICD-10-CM | POA: Diagnosis not present

## 2020-10-22 DIAGNOSIS — Z992 Dependence on renal dialysis: Secondary | ICD-10-CM | POA: Diagnosis not present

## 2020-10-23 ENCOUNTER — Ambulatory Visit: Payer: Medicare HMO | Admitting: Gastroenterology

## 2020-10-23 DIAGNOSIS — N186 End stage renal disease: Secondary | ICD-10-CM | POA: Diagnosis not present

## 2020-10-23 DIAGNOSIS — Z992 Dependence on renal dialysis: Secondary | ICD-10-CM | POA: Diagnosis not present

## 2020-10-24 DIAGNOSIS — N186 End stage renal disease: Secondary | ICD-10-CM | POA: Diagnosis not present

## 2020-10-24 DIAGNOSIS — Z992 Dependence on renal dialysis: Secondary | ICD-10-CM | POA: Diagnosis not present

## 2020-10-25 DIAGNOSIS — N186 End stage renal disease: Secondary | ICD-10-CM | POA: Diagnosis not present

## 2020-10-25 DIAGNOSIS — Z992 Dependence on renal dialysis: Secondary | ICD-10-CM | POA: Diagnosis not present

## 2020-10-26 DIAGNOSIS — Z992 Dependence on renal dialysis: Secondary | ICD-10-CM | POA: Diagnosis not present

## 2020-10-26 DIAGNOSIS — N186 End stage renal disease: Secondary | ICD-10-CM | POA: Diagnosis not present

## 2020-10-27 DIAGNOSIS — Z992 Dependence on renal dialysis: Secondary | ICD-10-CM | POA: Diagnosis not present

## 2020-10-27 DIAGNOSIS — N186 End stage renal disease: Secondary | ICD-10-CM | POA: Diagnosis not present

## 2020-10-28 DIAGNOSIS — Z992 Dependence on renal dialysis: Secondary | ICD-10-CM | POA: Diagnosis not present

## 2020-10-28 DIAGNOSIS — N186 End stage renal disease: Secondary | ICD-10-CM | POA: Diagnosis not present

## 2020-10-29 DIAGNOSIS — N186 End stage renal disease: Secondary | ICD-10-CM | POA: Diagnosis not present

## 2020-10-29 DIAGNOSIS — Z992 Dependence on renal dialysis: Secondary | ICD-10-CM | POA: Diagnosis not present

## 2020-10-29 DIAGNOSIS — D509 Iron deficiency anemia, unspecified: Secondary | ICD-10-CM | POA: Diagnosis not present

## 2020-10-30 DIAGNOSIS — N186 End stage renal disease: Secondary | ICD-10-CM | POA: Diagnosis not present

## 2020-10-30 DIAGNOSIS — Z992 Dependence on renal dialysis: Secondary | ICD-10-CM | POA: Diagnosis not present

## 2020-10-31 DIAGNOSIS — N186 End stage renal disease: Secondary | ICD-10-CM | POA: Diagnosis not present

## 2020-10-31 DIAGNOSIS — Z992 Dependence on renal dialysis: Secondary | ICD-10-CM | POA: Diagnosis not present

## 2020-11-01 DIAGNOSIS — N186 End stage renal disease: Secondary | ICD-10-CM | POA: Diagnosis not present

## 2020-11-01 DIAGNOSIS — Z992 Dependence on renal dialysis: Secondary | ICD-10-CM | POA: Diagnosis not present

## 2020-11-02 DIAGNOSIS — N186 End stage renal disease: Secondary | ICD-10-CM | POA: Diagnosis not present

## 2020-11-02 DIAGNOSIS — Z992 Dependence on renal dialysis: Secondary | ICD-10-CM | POA: Diagnosis not present

## 2020-11-03 DIAGNOSIS — N186 End stage renal disease: Secondary | ICD-10-CM | POA: Diagnosis not present

## 2020-11-03 DIAGNOSIS — Z992 Dependence on renal dialysis: Secondary | ICD-10-CM | POA: Diagnosis not present

## 2020-11-04 DIAGNOSIS — Z992 Dependence on renal dialysis: Secondary | ICD-10-CM | POA: Diagnosis not present

## 2020-11-04 DIAGNOSIS — N186 End stage renal disease: Secondary | ICD-10-CM | POA: Diagnosis not present

## 2020-11-05 DIAGNOSIS — Z992 Dependence on renal dialysis: Secondary | ICD-10-CM | POA: Diagnosis not present

## 2020-11-05 DIAGNOSIS — N186 End stage renal disease: Secondary | ICD-10-CM | POA: Diagnosis not present

## 2020-11-06 DIAGNOSIS — N186 End stage renal disease: Secondary | ICD-10-CM | POA: Diagnosis not present

## 2020-11-06 DIAGNOSIS — Z992 Dependence on renal dialysis: Secondary | ICD-10-CM | POA: Diagnosis not present

## 2020-11-07 DIAGNOSIS — Z992 Dependence on renal dialysis: Secondary | ICD-10-CM | POA: Diagnosis not present

## 2020-11-07 DIAGNOSIS — N186 End stage renal disease: Secondary | ICD-10-CM | POA: Diagnosis not present

## 2020-11-08 DIAGNOSIS — Z992 Dependence on renal dialysis: Secondary | ICD-10-CM | POA: Diagnosis not present

## 2020-11-08 DIAGNOSIS — N186 End stage renal disease: Secondary | ICD-10-CM | POA: Diagnosis not present

## 2020-11-09 DIAGNOSIS — Z992 Dependence on renal dialysis: Secondary | ICD-10-CM | POA: Diagnosis not present

## 2020-11-09 DIAGNOSIS — N186 End stage renal disease: Secondary | ICD-10-CM | POA: Diagnosis not present

## 2020-11-10 DIAGNOSIS — Z992 Dependence on renal dialysis: Secondary | ICD-10-CM | POA: Diagnosis not present

## 2020-11-10 DIAGNOSIS — N186 End stage renal disease: Secondary | ICD-10-CM | POA: Diagnosis not present

## 2020-11-11 DIAGNOSIS — Z992 Dependence on renal dialysis: Secondary | ICD-10-CM | POA: Diagnosis not present

## 2020-11-11 DIAGNOSIS — N186 End stage renal disease: Secondary | ICD-10-CM | POA: Diagnosis not present

## 2020-11-12 DIAGNOSIS — Z992 Dependence on renal dialysis: Secondary | ICD-10-CM | POA: Diagnosis not present

## 2020-11-12 DIAGNOSIS — N186 End stage renal disease: Secondary | ICD-10-CM | POA: Diagnosis not present

## 2020-11-13 DIAGNOSIS — N186 End stage renal disease: Secondary | ICD-10-CM | POA: Diagnosis not present

## 2020-11-13 DIAGNOSIS — Z992 Dependence on renal dialysis: Secondary | ICD-10-CM | POA: Diagnosis not present

## 2020-11-14 DIAGNOSIS — N186 End stage renal disease: Secondary | ICD-10-CM | POA: Diagnosis not present

## 2020-11-14 DIAGNOSIS — Z992 Dependence on renal dialysis: Secondary | ICD-10-CM | POA: Diagnosis not present

## 2020-11-15 DIAGNOSIS — N186 End stage renal disease: Secondary | ICD-10-CM | POA: Diagnosis not present

## 2020-11-15 DIAGNOSIS — Z992 Dependence on renal dialysis: Secondary | ICD-10-CM | POA: Diagnosis not present

## 2020-11-16 DIAGNOSIS — Z992 Dependence on renal dialysis: Secondary | ICD-10-CM | POA: Diagnosis not present

## 2020-11-16 DIAGNOSIS — N186 End stage renal disease: Secondary | ICD-10-CM | POA: Diagnosis not present

## 2020-11-17 DIAGNOSIS — N186 End stage renal disease: Secondary | ICD-10-CM | POA: Diagnosis not present

## 2020-11-17 DIAGNOSIS — Z992 Dependence on renal dialysis: Secondary | ICD-10-CM | POA: Diagnosis not present

## 2020-11-18 DIAGNOSIS — N186 End stage renal disease: Secondary | ICD-10-CM | POA: Diagnosis not present

## 2020-11-18 DIAGNOSIS — Z992 Dependence on renal dialysis: Secondary | ICD-10-CM | POA: Diagnosis not present

## 2020-11-19 DIAGNOSIS — Z992 Dependence on renal dialysis: Secondary | ICD-10-CM | POA: Diagnosis not present

## 2020-11-19 DIAGNOSIS — N186 End stage renal disease: Secondary | ICD-10-CM | POA: Diagnosis not present

## 2020-11-20 DIAGNOSIS — N186 End stage renal disease: Secondary | ICD-10-CM | POA: Diagnosis not present

## 2020-11-20 DIAGNOSIS — Z992 Dependence on renal dialysis: Secondary | ICD-10-CM | POA: Diagnosis not present

## 2020-11-21 DIAGNOSIS — Z992 Dependence on renal dialysis: Secondary | ICD-10-CM | POA: Diagnosis not present

## 2020-11-21 DIAGNOSIS — N186 End stage renal disease: Secondary | ICD-10-CM | POA: Diagnosis not present

## 2020-11-22 DIAGNOSIS — Z992 Dependence on renal dialysis: Secondary | ICD-10-CM | POA: Diagnosis not present

## 2020-11-22 DIAGNOSIS — N186 End stage renal disease: Secondary | ICD-10-CM | POA: Diagnosis not present

## 2020-11-23 DIAGNOSIS — Z992 Dependence on renal dialysis: Secondary | ICD-10-CM | POA: Diagnosis not present

## 2020-11-23 DIAGNOSIS — N186 End stage renal disease: Secondary | ICD-10-CM | POA: Diagnosis not present

## 2020-11-24 DIAGNOSIS — Z992 Dependence on renal dialysis: Secondary | ICD-10-CM | POA: Diagnosis not present

## 2020-11-24 DIAGNOSIS — N186 End stage renal disease: Secondary | ICD-10-CM | POA: Diagnosis not present

## 2020-11-25 DIAGNOSIS — N186 End stage renal disease: Secondary | ICD-10-CM | POA: Diagnosis not present

## 2020-11-25 DIAGNOSIS — Z992 Dependence on renal dialysis: Secondary | ICD-10-CM | POA: Diagnosis not present

## 2020-11-26 DIAGNOSIS — N186 End stage renal disease: Secondary | ICD-10-CM | POA: Diagnosis not present

## 2020-11-26 DIAGNOSIS — Z992 Dependence on renal dialysis: Secondary | ICD-10-CM | POA: Diagnosis not present

## 2020-11-26 DIAGNOSIS — D509 Iron deficiency anemia, unspecified: Secondary | ICD-10-CM | POA: Diagnosis not present

## 2020-11-27 DIAGNOSIS — N186 End stage renal disease: Secondary | ICD-10-CM | POA: Diagnosis not present

## 2020-11-27 DIAGNOSIS — Z992 Dependence on renal dialysis: Secondary | ICD-10-CM | POA: Diagnosis not present

## 2020-11-28 DIAGNOSIS — Z992 Dependence on renal dialysis: Secondary | ICD-10-CM | POA: Diagnosis not present

## 2020-11-28 DIAGNOSIS — N186 End stage renal disease: Secondary | ICD-10-CM | POA: Diagnosis not present

## 2020-11-29 DIAGNOSIS — Z992 Dependence on renal dialysis: Secondary | ICD-10-CM | POA: Diagnosis not present

## 2020-11-29 DIAGNOSIS — N186 End stage renal disease: Secondary | ICD-10-CM | POA: Diagnosis not present

## 2020-11-30 DIAGNOSIS — Z992 Dependence on renal dialysis: Secondary | ICD-10-CM | POA: Diagnosis not present

## 2020-11-30 DIAGNOSIS — N186 End stage renal disease: Secondary | ICD-10-CM | POA: Diagnosis not present

## 2020-12-01 DIAGNOSIS — N186 End stage renal disease: Secondary | ICD-10-CM | POA: Diagnosis not present

## 2020-12-01 DIAGNOSIS — Z992 Dependence on renal dialysis: Secondary | ICD-10-CM | POA: Diagnosis not present

## 2020-12-02 DIAGNOSIS — N186 End stage renal disease: Secondary | ICD-10-CM | POA: Diagnosis not present

## 2020-12-02 DIAGNOSIS — Z992 Dependence on renal dialysis: Secondary | ICD-10-CM | POA: Diagnosis not present

## 2020-12-03 DIAGNOSIS — Z992 Dependence on renal dialysis: Secondary | ICD-10-CM | POA: Diagnosis not present

## 2020-12-03 DIAGNOSIS — N186 End stage renal disease: Secondary | ICD-10-CM | POA: Diagnosis not present

## 2020-12-04 ENCOUNTER — Ambulatory Visit: Payer: Medicare HMO | Admitting: Gastroenterology

## 2020-12-04 DIAGNOSIS — Z992 Dependence on renal dialysis: Secondary | ICD-10-CM | POA: Diagnosis not present

## 2020-12-04 DIAGNOSIS — N186 End stage renal disease: Secondary | ICD-10-CM | POA: Diagnosis not present

## 2020-12-05 DIAGNOSIS — N186 End stage renal disease: Secondary | ICD-10-CM | POA: Diagnosis not present

## 2020-12-05 DIAGNOSIS — Z992 Dependence on renal dialysis: Secondary | ICD-10-CM | POA: Diagnosis not present

## 2020-12-06 DIAGNOSIS — Z992 Dependence on renal dialysis: Secondary | ICD-10-CM | POA: Diagnosis not present

## 2020-12-06 DIAGNOSIS — N186 End stage renal disease: Secondary | ICD-10-CM | POA: Diagnosis not present

## 2020-12-07 DIAGNOSIS — Z992 Dependence on renal dialysis: Secondary | ICD-10-CM | POA: Diagnosis not present

## 2020-12-07 DIAGNOSIS — N186 End stage renal disease: Secondary | ICD-10-CM | POA: Diagnosis not present

## 2020-12-08 DIAGNOSIS — Z992 Dependence on renal dialysis: Secondary | ICD-10-CM | POA: Diagnosis not present

## 2020-12-08 DIAGNOSIS — N186 End stage renal disease: Secondary | ICD-10-CM | POA: Diagnosis not present

## 2020-12-09 DIAGNOSIS — N186 End stage renal disease: Secondary | ICD-10-CM | POA: Diagnosis not present

## 2020-12-09 DIAGNOSIS — Z992 Dependence on renal dialysis: Secondary | ICD-10-CM | POA: Diagnosis not present

## 2020-12-10 DIAGNOSIS — Z992 Dependence on renal dialysis: Secondary | ICD-10-CM | POA: Diagnosis not present

## 2020-12-10 DIAGNOSIS — N186 End stage renal disease: Secondary | ICD-10-CM | POA: Diagnosis not present

## 2020-12-11 DIAGNOSIS — Z992 Dependence on renal dialysis: Secondary | ICD-10-CM | POA: Diagnosis not present

## 2020-12-11 DIAGNOSIS — N186 End stage renal disease: Secondary | ICD-10-CM | POA: Diagnosis not present

## 2020-12-12 DIAGNOSIS — Z992 Dependence on renal dialysis: Secondary | ICD-10-CM | POA: Diagnosis not present

## 2020-12-12 DIAGNOSIS — N186 End stage renal disease: Secondary | ICD-10-CM | POA: Diagnosis not present

## 2020-12-13 DIAGNOSIS — Z992 Dependence on renal dialysis: Secondary | ICD-10-CM | POA: Diagnosis not present

## 2020-12-13 DIAGNOSIS — N186 End stage renal disease: Secondary | ICD-10-CM | POA: Diagnosis not present

## 2020-12-14 DIAGNOSIS — Z992 Dependence on renal dialysis: Secondary | ICD-10-CM | POA: Diagnosis not present

## 2020-12-14 DIAGNOSIS — N186 End stage renal disease: Secondary | ICD-10-CM | POA: Diagnosis not present

## 2020-12-15 DIAGNOSIS — N186 End stage renal disease: Secondary | ICD-10-CM | POA: Diagnosis not present

## 2020-12-15 DIAGNOSIS — Z992 Dependence on renal dialysis: Secondary | ICD-10-CM | POA: Diagnosis not present

## 2020-12-16 DIAGNOSIS — Z992 Dependence on renal dialysis: Secondary | ICD-10-CM | POA: Diagnosis not present

## 2020-12-16 DIAGNOSIS — N186 End stage renal disease: Secondary | ICD-10-CM | POA: Diagnosis not present

## 2020-12-17 DIAGNOSIS — Z992 Dependence on renal dialysis: Secondary | ICD-10-CM | POA: Diagnosis not present

## 2020-12-17 DIAGNOSIS — N186 End stage renal disease: Secondary | ICD-10-CM | POA: Diagnosis not present

## 2020-12-18 DIAGNOSIS — N186 End stage renal disease: Secondary | ICD-10-CM | POA: Diagnosis not present

## 2020-12-18 DIAGNOSIS — Z992 Dependence on renal dialysis: Secondary | ICD-10-CM | POA: Diagnosis not present

## 2020-12-19 DIAGNOSIS — N186 End stage renal disease: Secondary | ICD-10-CM | POA: Diagnosis not present

## 2020-12-19 DIAGNOSIS — Z992 Dependence on renal dialysis: Secondary | ICD-10-CM | POA: Diagnosis not present

## 2020-12-20 DIAGNOSIS — Z992 Dependence on renal dialysis: Secondary | ICD-10-CM | POA: Diagnosis not present

## 2020-12-20 DIAGNOSIS — N186 End stage renal disease: Secondary | ICD-10-CM | POA: Diagnosis not present

## 2020-12-21 DIAGNOSIS — N186 End stage renal disease: Secondary | ICD-10-CM | POA: Diagnosis not present

## 2020-12-21 DIAGNOSIS — Z992 Dependence on renal dialysis: Secondary | ICD-10-CM | POA: Diagnosis not present

## 2020-12-22 DIAGNOSIS — N186 End stage renal disease: Secondary | ICD-10-CM | POA: Diagnosis not present

## 2020-12-22 DIAGNOSIS — Z992 Dependence on renal dialysis: Secondary | ICD-10-CM | POA: Diagnosis not present

## 2020-12-23 DIAGNOSIS — Z992 Dependence on renal dialysis: Secondary | ICD-10-CM | POA: Diagnosis not present

## 2020-12-23 DIAGNOSIS — N186 End stage renal disease: Secondary | ICD-10-CM | POA: Diagnosis not present

## 2020-12-24 DIAGNOSIS — N186 End stage renal disease: Secondary | ICD-10-CM | POA: Diagnosis not present

## 2020-12-24 DIAGNOSIS — Z992 Dependence on renal dialysis: Secondary | ICD-10-CM | POA: Diagnosis not present

## 2020-12-25 DIAGNOSIS — Z992 Dependence on renal dialysis: Secondary | ICD-10-CM | POA: Diagnosis not present

## 2020-12-25 DIAGNOSIS — N186 End stage renal disease: Secondary | ICD-10-CM | POA: Diagnosis not present

## 2020-12-26 DIAGNOSIS — N186 End stage renal disease: Secondary | ICD-10-CM | POA: Diagnosis not present

## 2020-12-26 DIAGNOSIS — Z992 Dependence on renal dialysis: Secondary | ICD-10-CM | POA: Diagnosis not present

## 2020-12-27 DIAGNOSIS — N186 End stage renal disease: Secondary | ICD-10-CM | POA: Diagnosis not present

## 2020-12-27 DIAGNOSIS — Z992 Dependence on renal dialysis: Secondary | ICD-10-CM | POA: Diagnosis not present

## 2020-12-28 DIAGNOSIS — N186 End stage renal disease: Secondary | ICD-10-CM | POA: Diagnosis not present

## 2020-12-28 DIAGNOSIS — Z992 Dependence on renal dialysis: Secondary | ICD-10-CM | POA: Diagnosis not present

## 2020-12-29 DIAGNOSIS — Z992 Dependence on renal dialysis: Secondary | ICD-10-CM | POA: Diagnosis not present

## 2020-12-29 DIAGNOSIS — N186 End stage renal disease: Secondary | ICD-10-CM | POA: Diagnosis not present

## 2020-12-30 DIAGNOSIS — N186 End stage renal disease: Secondary | ICD-10-CM | POA: Diagnosis not present

## 2020-12-30 DIAGNOSIS — Z992 Dependence on renal dialysis: Secondary | ICD-10-CM | POA: Diagnosis not present

## 2020-12-31 DIAGNOSIS — N186 End stage renal disease: Secondary | ICD-10-CM | POA: Diagnosis not present

## 2020-12-31 DIAGNOSIS — Z992 Dependence on renal dialysis: Secondary | ICD-10-CM | POA: Diagnosis not present

## 2021-01-01 DIAGNOSIS — Z992 Dependence on renal dialysis: Secondary | ICD-10-CM | POA: Diagnosis not present

## 2021-01-01 DIAGNOSIS — N186 End stage renal disease: Secondary | ICD-10-CM | POA: Diagnosis not present

## 2021-01-02 DIAGNOSIS — N186 End stage renal disease: Secondary | ICD-10-CM | POA: Diagnosis not present

## 2021-01-02 DIAGNOSIS — Z992 Dependence on renal dialysis: Secondary | ICD-10-CM | POA: Diagnosis not present

## 2021-01-03 DIAGNOSIS — N186 End stage renal disease: Secondary | ICD-10-CM | POA: Diagnosis not present

## 2021-01-03 DIAGNOSIS — Z992 Dependence on renal dialysis: Secondary | ICD-10-CM | POA: Diagnosis not present

## 2021-01-04 DIAGNOSIS — N186 End stage renal disease: Secondary | ICD-10-CM | POA: Diagnosis not present

## 2021-01-04 DIAGNOSIS — Z992 Dependence on renal dialysis: Secondary | ICD-10-CM | POA: Diagnosis not present

## 2021-01-05 DIAGNOSIS — N186 End stage renal disease: Secondary | ICD-10-CM | POA: Diagnosis not present

## 2021-01-05 DIAGNOSIS — Z992 Dependence on renal dialysis: Secondary | ICD-10-CM | POA: Diagnosis not present

## 2021-01-06 DIAGNOSIS — Z992 Dependence on renal dialysis: Secondary | ICD-10-CM | POA: Diagnosis not present

## 2021-01-06 DIAGNOSIS — N186 End stage renal disease: Secondary | ICD-10-CM | POA: Diagnosis not present

## 2021-01-07 DIAGNOSIS — N186 End stage renal disease: Secondary | ICD-10-CM | POA: Diagnosis not present

## 2021-01-07 DIAGNOSIS — Z992 Dependence on renal dialysis: Secondary | ICD-10-CM | POA: Diagnosis not present

## 2021-01-08 DIAGNOSIS — Z992 Dependence on renal dialysis: Secondary | ICD-10-CM | POA: Diagnosis not present

## 2021-01-08 DIAGNOSIS — N186 End stage renal disease: Secondary | ICD-10-CM | POA: Diagnosis not present

## 2021-01-09 DIAGNOSIS — Z992 Dependence on renal dialysis: Secondary | ICD-10-CM | POA: Diagnosis not present

## 2021-01-09 DIAGNOSIS — N186 End stage renal disease: Secondary | ICD-10-CM | POA: Diagnosis not present

## 2021-01-10 DIAGNOSIS — Z992 Dependence on renal dialysis: Secondary | ICD-10-CM | POA: Diagnosis not present

## 2021-01-10 DIAGNOSIS — N186 End stage renal disease: Secondary | ICD-10-CM | POA: Diagnosis not present

## 2021-01-11 DIAGNOSIS — N186 End stage renal disease: Secondary | ICD-10-CM | POA: Diagnosis not present

## 2021-01-11 DIAGNOSIS — Z992 Dependence on renal dialysis: Secondary | ICD-10-CM | POA: Diagnosis not present

## 2021-01-12 DIAGNOSIS — Z992 Dependence on renal dialysis: Secondary | ICD-10-CM | POA: Diagnosis not present

## 2021-01-12 DIAGNOSIS — N186 End stage renal disease: Secondary | ICD-10-CM | POA: Diagnosis not present

## 2021-01-13 DIAGNOSIS — N186 End stage renal disease: Secondary | ICD-10-CM | POA: Diagnosis not present

## 2021-01-13 DIAGNOSIS — Z992 Dependence on renal dialysis: Secondary | ICD-10-CM | POA: Diagnosis not present

## 2021-01-14 DIAGNOSIS — Z7902 Long term (current) use of antithrombotics/antiplatelets: Secondary | ICD-10-CM | POA: Diagnosis not present

## 2021-01-14 DIAGNOSIS — E213 Hyperparathyroidism, unspecified: Secondary | ICD-10-CM | POA: Diagnosis not present

## 2021-01-14 DIAGNOSIS — I120 Hypertensive chronic kidney disease with stage 5 chronic kidney disease or end stage renal disease: Secondary | ICD-10-CM | POA: Diagnosis not present

## 2021-01-14 DIAGNOSIS — Z992 Dependence on renal dialysis: Secondary | ICD-10-CM | POA: Diagnosis not present

## 2021-01-14 DIAGNOSIS — Z8673 Personal history of transient ischemic attack (TIA), and cerebral infarction without residual deficits: Secondary | ICD-10-CM | POA: Diagnosis not present

## 2021-01-14 DIAGNOSIS — Z809 Family history of malignant neoplasm, unspecified: Secondary | ICD-10-CM | POA: Diagnosis not present

## 2021-01-14 DIAGNOSIS — N186 End stage renal disease: Secondary | ICD-10-CM | POA: Diagnosis not present

## 2021-01-14 DIAGNOSIS — R69 Illness, unspecified: Secondary | ICD-10-CM | POA: Diagnosis not present

## 2021-01-15 DIAGNOSIS — N186 End stage renal disease: Secondary | ICD-10-CM | POA: Diagnosis not present

## 2021-01-15 DIAGNOSIS — Z992 Dependence on renal dialysis: Secondary | ICD-10-CM | POA: Diagnosis not present

## 2021-01-16 DIAGNOSIS — N186 End stage renal disease: Secondary | ICD-10-CM | POA: Diagnosis not present

## 2021-01-16 DIAGNOSIS — Z992 Dependence on renal dialysis: Secondary | ICD-10-CM | POA: Diagnosis not present

## 2021-01-17 DIAGNOSIS — Z992 Dependence on renal dialysis: Secondary | ICD-10-CM | POA: Diagnosis not present

## 2021-01-17 DIAGNOSIS — N186 End stage renal disease: Secondary | ICD-10-CM | POA: Diagnosis not present

## 2021-01-18 DIAGNOSIS — Z992 Dependence on renal dialysis: Secondary | ICD-10-CM | POA: Diagnosis not present

## 2021-01-18 DIAGNOSIS — N186 End stage renal disease: Secondary | ICD-10-CM | POA: Diagnosis not present

## 2021-01-19 DIAGNOSIS — N186 End stage renal disease: Secondary | ICD-10-CM | POA: Diagnosis not present

## 2021-01-19 DIAGNOSIS — Z992 Dependence on renal dialysis: Secondary | ICD-10-CM | POA: Diagnosis not present

## 2021-01-20 DIAGNOSIS — N186 End stage renal disease: Secondary | ICD-10-CM | POA: Diagnosis not present

## 2021-01-20 DIAGNOSIS — Z992 Dependence on renal dialysis: Secondary | ICD-10-CM | POA: Diagnosis not present

## 2021-01-21 DIAGNOSIS — D8481 Immunodeficiency due to conditions classified elsewhere: Secondary | ICD-10-CM | POA: Diagnosis not present

## 2021-01-21 DIAGNOSIS — D631 Anemia in chronic kidney disease: Secondary | ICD-10-CM | POA: Diagnosis not present

## 2021-01-21 DIAGNOSIS — E785 Hyperlipidemia, unspecified: Secondary | ICD-10-CM | POA: Diagnosis not present

## 2021-01-21 DIAGNOSIS — I120 Hypertensive chronic kidney disease with stage 5 chronic kidney disease or end stage renal disease: Secondary | ICD-10-CM | POA: Diagnosis not present

## 2021-01-21 DIAGNOSIS — R531 Weakness: Secondary | ICD-10-CM | POA: Diagnosis not present

## 2021-01-21 DIAGNOSIS — Z9049 Acquired absence of other specified parts of digestive tract: Secondary | ICD-10-CM | POA: Diagnosis not present

## 2021-01-21 DIAGNOSIS — N2581 Secondary hyperparathyroidism of renal origin: Secondary | ICD-10-CM | POA: Diagnosis not present

## 2021-01-21 DIAGNOSIS — I69398 Other sequelae of cerebral infarction: Secondary | ICD-10-CM | POA: Diagnosis not present

## 2021-01-21 DIAGNOSIS — Z992 Dependence on renal dialysis: Secondary | ICD-10-CM | POA: Diagnosis not present

## 2021-01-21 DIAGNOSIS — R69 Illness, unspecified: Secondary | ICD-10-CM | POA: Diagnosis not present

## 2021-01-21 DIAGNOSIS — N186 End stage renal disease: Secondary | ICD-10-CM | POA: Diagnosis not present

## 2021-01-22 DIAGNOSIS — Z992 Dependence on renal dialysis: Secondary | ICD-10-CM | POA: Diagnosis not present

## 2021-01-22 DIAGNOSIS — N186 End stage renal disease: Secondary | ICD-10-CM | POA: Diagnosis not present

## 2021-01-23 DIAGNOSIS — N186 End stage renal disease: Secondary | ICD-10-CM | POA: Diagnosis not present

## 2021-01-23 DIAGNOSIS — D509 Iron deficiency anemia, unspecified: Secondary | ICD-10-CM | POA: Diagnosis not present

## 2021-01-23 DIAGNOSIS — Z992 Dependence on renal dialysis: Secondary | ICD-10-CM | POA: Diagnosis not present

## 2021-01-24 DIAGNOSIS — Z992 Dependence on renal dialysis: Secondary | ICD-10-CM | POA: Diagnosis not present

## 2021-01-24 DIAGNOSIS — N186 End stage renal disease: Secondary | ICD-10-CM | POA: Diagnosis not present

## 2021-01-25 DIAGNOSIS — N186 End stage renal disease: Secondary | ICD-10-CM | POA: Diagnosis not present

## 2021-01-25 DIAGNOSIS — Z992 Dependence on renal dialysis: Secondary | ICD-10-CM | POA: Diagnosis not present

## 2021-01-26 DIAGNOSIS — Z992 Dependence on renal dialysis: Secondary | ICD-10-CM | POA: Diagnosis not present

## 2021-01-26 DIAGNOSIS — N186 End stage renal disease: Secondary | ICD-10-CM | POA: Diagnosis not present

## 2021-01-27 DIAGNOSIS — Z992 Dependence on renal dialysis: Secondary | ICD-10-CM | POA: Diagnosis not present

## 2021-01-27 DIAGNOSIS — N186 End stage renal disease: Secondary | ICD-10-CM | POA: Diagnosis not present

## 2021-01-28 DIAGNOSIS — Z992 Dependence on renal dialysis: Secondary | ICD-10-CM | POA: Diagnosis not present

## 2021-01-28 DIAGNOSIS — N186 End stage renal disease: Secondary | ICD-10-CM | POA: Diagnosis not present

## 2021-01-29 ENCOUNTER — Telehealth: Payer: Self-pay

## 2021-01-29 DIAGNOSIS — Z992 Dependence on renal dialysis: Secondary | ICD-10-CM | POA: Diagnosis not present

## 2021-01-29 DIAGNOSIS — N186 End stage renal disease: Secondary | ICD-10-CM | POA: Diagnosis not present

## 2021-01-29 NOTE — Telephone Encounter (Signed)
Derrick Moore with [facility] called stating the NP that does dialysis is requesting pt's PCP place a psychiatry referral for pt.  Please call Derrick Moore back at [phone] and let the person know you are calling her in regards to Pt. Derrick Moore.

## 2021-01-30 DIAGNOSIS — Z992 Dependence on renal dialysis: Secondary | ICD-10-CM | POA: Diagnosis not present

## 2021-01-30 DIAGNOSIS — N186 End stage renal disease: Secondary | ICD-10-CM | POA: Diagnosis not present

## 2021-01-30 NOTE — Telephone Encounter (Signed)
Talked with Staff at [facility]. They could not give me details on pt mental status. So gave number for nursing staff or NP to call back. Explained leave message as I may be in visit with patients.

## 2021-01-30 NOTE — Telephone Encounter (Signed)
Appointment Friday at 230 made

## 2021-01-31 DIAGNOSIS — Z992 Dependence on renal dialysis: Secondary | ICD-10-CM | POA: Diagnosis not present

## 2021-01-31 DIAGNOSIS — N186 End stage renal disease: Secondary | ICD-10-CM | POA: Diagnosis not present

## 2021-02-01 ENCOUNTER — Ambulatory Visit: Payer: Medicare HMO | Admitting: Medical

## 2021-02-01 DIAGNOSIS — Z992 Dependence on renal dialysis: Secondary | ICD-10-CM | POA: Diagnosis not present

## 2021-02-01 DIAGNOSIS — N186 End stage renal disease: Secondary | ICD-10-CM | POA: Diagnosis not present

## 2021-02-02 DIAGNOSIS — Z992 Dependence on renal dialysis: Secondary | ICD-10-CM | POA: Diagnosis not present

## 2021-02-02 DIAGNOSIS — N186 End stage renal disease: Secondary | ICD-10-CM | POA: Diagnosis not present

## 2021-02-03 DIAGNOSIS — Z992 Dependence on renal dialysis: Secondary | ICD-10-CM | POA: Diagnosis not present

## 2021-02-03 DIAGNOSIS — N186 End stage renal disease: Secondary | ICD-10-CM | POA: Diagnosis not present

## 2021-02-04 DIAGNOSIS — Z992 Dependence on renal dialysis: Secondary | ICD-10-CM | POA: Diagnosis not present

## 2021-02-04 DIAGNOSIS — N186 End stage renal disease: Secondary | ICD-10-CM | POA: Diagnosis not present

## 2021-02-05 DIAGNOSIS — Z992 Dependence on renal dialysis: Secondary | ICD-10-CM | POA: Diagnosis not present

## 2021-02-05 DIAGNOSIS — N186 End stage renal disease: Secondary | ICD-10-CM | POA: Diagnosis not present

## 2021-02-06 DIAGNOSIS — Z992 Dependence on renal dialysis: Secondary | ICD-10-CM | POA: Diagnosis not present

## 2021-02-06 DIAGNOSIS — N186 End stage renal disease: Secondary | ICD-10-CM | POA: Diagnosis not present

## 2021-02-07 DIAGNOSIS — Z992 Dependence on renal dialysis: Secondary | ICD-10-CM | POA: Diagnosis not present

## 2021-02-07 DIAGNOSIS — N186 End stage renal disease: Secondary | ICD-10-CM | POA: Diagnosis not present

## 2021-02-08 ENCOUNTER — Other Ambulatory Visit: Payer: Self-pay

## 2021-02-08 ENCOUNTER — Ambulatory Visit (INDEPENDENT_AMBULATORY_CARE_PROVIDER_SITE_OTHER): Payer: Medicare HMO | Admitting: Medical

## 2021-02-08 ENCOUNTER — Telehealth: Payer: Self-pay

## 2021-02-08 ENCOUNTER — Ambulatory Visit (HOSPITAL_BASED_OUTPATIENT_CLINIC_OR_DEPARTMENT_OTHER)
Admission: RE | Admit: 2021-02-08 | Discharge: 2021-02-08 | Disposition: A | Discharge status: Home / Self Care | Payer: Medicare HMO | Source: Ambulatory Visit | Attending: Medical | Admitting: Medical

## 2021-02-08 VITALS — BP 102/75 | HR 83 | Resp 18 | Ht 70.0 in | Wt 153.0 lb

## 2021-02-08 DIAGNOSIS — M79671 Pain in right foot: Secondary | ICD-10-CM | POA: Diagnosis not present

## 2021-02-08 DIAGNOSIS — I10 Essential (primary) hypertension: Secondary | ICD-10-CM | POA: Diagnosis not present

## 2021-02-08 DIAGNOSIS — M25551 Pain in right hip: Secondary | ICD-10-CM | POA: Diagnosis not present

## 2021-02-08 DIAGNOSIS — F419 Anxiety disorder, unspecified: Secondary | ICD-10-CM | POA: Diagnosis not present

## 2021-02-08 DIAGNOSIS — M79672 Pain in left foot: Secondary | ICD-10-CM

## 2021-02-08 DIAGNOSIS — G8929 Other chronic pain: Secondary | ICD-10-CM | POA: Diagnosis not present

## 2021-02-08 DIAGNOSIS — R69 Illness, unspecified: Secondary | ICD-10-CM | POA: Diagnosis not present

## 2021-02-08 DIAGNOSIS — Z992 Dependence on renal dialysis: Secondary | ICD-10-CM | POA: Diagnosis not present

## 2021-02-08 DIAGNOSIS — F32A Depression, unspecified: Secondary | ICD-10-CM | POA: Diagnosis not present

## 2021-02-08 DIAGNOSIS — N186 End stage renal disease: Secondary | ICD-10-CM | POA: Diagnosis not present

## 2021-02-08 DIAGNOSIS — I878 Other specified disorders of veins: Secondary | ICD-10-CM | POA: Diagnosis not present

## 2021-02-08 LAB — COMPREHENSIVE METABOLIC PANEL
ALT: 12 U/L (ref 0–53)
AST: 16 U/L (ref 0–37)
Albumin: 4.1 g/dL (ref 3.5–5.2)
Alkaline Phosphatase: 127 U/L — ABNORMAL HIGH (ref 39–117)
BUN: 77 mg/dL — ABNORMAL HIGH (ref 6–23)
CO2: 19 mEq/L (ref 19–32)
Calcium: 7.5 mg/dL — ABNORMAL LOW (ref 8.4–10.5)
Chloride: 105 mEq/L (ref 96–112)
Creatinine, Ser: 12.65 mg/dL (ref 0.40–1.50)
GFR: 4.09 mL/min — CL (ref >60.00)
Glucose, Bld: 96 mg/dL (ref 70–99)
Potassium: 5.2 mEq/L — ABNORMAL HIGH (ref 3.5–5.1)
Sodium: 137 mEq/L (ref 135–145)
Total Bilirubin: 0.3 mg/dL (ref 0.2–1.2)
Total Protein: 7.2 g/dL (ref 6.0–8.3)

## 2021-02-08 MED ORDER — SERTRALINE HCL 25 MG PO TABS: 25.0000 mg | ORAL_TABLET | Freq: Every day | ORAL | 3 refills | Status: DC

## 2021-02-08 NOTE — Progress Notes (Signed)
Subjective:    Patient ID: Derrick Moore, male    DOB: 06-11-1966, 54 y.o.   MRN: [id number]  HPI Pt inf for evaluation for depression/mood. He has some problems dealing with divorce and issues with kids. He states dwells on these negative events in the past. States will have some days of moderate depression for 2 days. He will feel better then depressed again.  I had gotten call from dialysis center that they thought he needed referral to psychiatrist. Called to ask for more detail. Was never notified of details. Asked to come in to discuss.  In the past pt was referred to behavioral health. Pt states with one video visit he did not make much progress. He never did 2nd visit. Pt can't remember who he saw. Pt saw Dr. Michail Sermon  Phq-9 score 9. Pt answered  yes several day thoughts of harming self. No thoughs of harm to others. Pt states these thought of harm to self are transient. Not present today. Never makes plans.   Pt is open to taking antidepressants.   Gad-7 score is 4.     Review of Systems  Constitutional:  Negative for chills, fatigue and fever.  Respiratory:  Negative for cough, choking, shortness of breath and wheezing.   Cardiovascular:  Negative for chest pain and palpitations.  Gastrointestinal:  Negative for abdominal pain, diarrhea, nausea and vomiting.  Musculoskeletal:  Negative for back pain.  Psychiatric/Behavioral:  Positive for behavioral problems and dysphoric mood. Negative for self-injury, sleep disturbance and suicidal ideas. The patient is nervous/anxious.     Past Medical History:  Diagnosis Date   Dialysis patient [facility])    Hyperlipidemia    Hypertension    Renal failure    Renal failure    Stroke [facility])      Social History   Socioeconomic History   Marital status: Single    Spouse name: Not on file   Number of children: Not on file   Years of education: Not on file   Highest education level: Not on file  Occupational History   Not on  file  Tobacco Use   Smoking status: Some Days    Packs/day: 0.25    Years: 33.00    Pack years: 8.25    Types: Cigarettes   Smokeless tobacco: Never  Vaping Use   Vaping Use: Never used  Substance and Sexual Activity   Alcohol use: Yes    Alcohol/week: 1.0 standard drink    Types: 1 Cans of beer per week    Comment: 1 beer a day at most on average. 12 pack in 2 weeks.   Drug use: Yes    Frequency: 7.0 times per week    Types: Marijuana    Comment: smoke daily. helps his appetite.   Sexual activity: Not on file  Other Topics Concern   Not on file  Social History Narrative   Not on file   Social Determinants of Health   Financial Resource Strain: Not on file  Food Insecurity: Not on file  Transportation Needs: Not on file  Physical Activity: Not on file  Stress: Not on file  Social Connections: Not on file  Intimate Partner Violence: Not on file    No past surgical history on file.  No family history on file.  Allergies  Allergen Reactions   No Known Allergies     Current Outpatient Medications on File Prior to Visit  Medication Sig Dispense Refill   amLODipine (NORVASC) 5 MG  tablet Take 5 mg by mouth at bedtime.     clopidogrel (PLAVIX) 75 MG tablet Take 75 mg by mouth daily.     hydrALAZINE (APRESOLINE) 50 MG tablet Take 50 mg by mouth 2 (two) times daily.     JUBLIA 10 % SOLN Apply topically.     labetalol (NORMODYNE) 200 MG tablet Take 200 mg by mouth 2 (two) times daily.     LASIX 40 MG tablet Take 40 mg by mouth every morning.     lisinopril (ZESTRIL) 30 MG tablet Take 30 mg by mouth at bedtime.     naproxen (NAPROSYN) 500 MG tablet Take 500 mg by mouth 2 (two) times daily.     pravastatin (PRAVACHOL) 10 MG tablet Take 10 mg by mouth daily.     RAYOS 5 MG TBEC Take 1 tablet by mouth daily.     sildenafil (VIAGRA) 50 MG tablet Take 1 tablet (50 mg total) by mouth daily as needed for erectile dysfunction. 10 tablet 0   No current facility-administered  medications on file prior to visit.    BP 102/75   Pulse 83   Resp 18   Ht '5\' 10"'$  (1.778 m)   Wt 153 lb (69.4 kg)   SpO2 100%   BMI 21.95 kg/m       Objective:   Physical Exam  General Mental Status- Alert. General Appearance- Not in acute distress.   Skin General: Color- Normal Color. Moisture- Normal Moisture.  Neck Carotid Arteries- Normal color. Moisture- Normal Moisture. No carotid bruits. No JVD.  Chest and Lung Exam Auscultation: Breath Sounds:-Normal.  Cardiovascular Auscultation:Rythm- Regular. Murmurs & Other Heart Sounds:Auscultation of the heart reveals- No Murmurs.  Abdomen Inspection:-Inspeection Normal. Palpation/Percussion:Note:No mass. Palpation and Percussion of the abdomen reveal- Non Tender, Non Distended + BS, no rebound or guarding.    Neurologic Cranial Nerve exam:- CN III-XII intact(No nystagmus), symmetric smile. Strength:- 5/5 equal and symmetric strength both upper and lower extremities.   Rt hip- mild soreness on rom.    Assessment & Plan:   For depression and anxiety will rx sertraline 25 mg low dose. Giving you list of psychiatrist and counselors for you to call. Hopefully will find good match. When you contact new office that will see you let me know who you are seeing and will place the referral and ask staff to help finalize scheduling you.  For new rt hip pain will get xray to evaluate if degenarative changes.  For feet pain use your Dr. Lala Lund blue heel pads. Stop naprosyn presently due to rare potential interaction with above. Check lft today. If normal try tylenol.   Follow up in 4 weeks or sooner if needed.   If you have any thoughts harm to self or others then recommend evaluation at [facility] ED.  [name], PA-C

## 2021-02-08 NOTE — Telephone Encounter (Signed)
CRITICAL VALUE STICKER  CRITICAL VALUE: Creat: 12.65, GFR: 4.09  RECEIVER (on-site recipient of call):Charisma  DATE & TIME NOTIFIED:  02/08/21, 2: 31 pm  MESSENGER (representative from lab): Saa  MD NOTIFIED: yes  TIME OF NOTIFICATION: 2:31 pm  RESPONSE: none yet

## 2021-02-08 NOTE — Patient Instructions (Addendum)
For depression and anxiety will rx sertraline 25 mg low dose. Giving you list of psychiatrist and counselors for you to call. Hopefully will find good match. When you contact new office that will see you let me know who you are seeing and will place the referral and ask staff to help finalize scheduling you.  For new rt hip pain will get xray to evaluate if degenerative changes.  For feet pain use your Dr. Lala Lund blue heel pads. Stop naprosyn presently due to rare potential interaction with above. Check LFT today. If normal try tylenol.   Follow up in 4 weeks or sooner if needed.   If you have any thoughts harm to self or others then recommend evaluation at [facility] ED.

## 2021-02-09 DIAGNOSIS — N186 End stage renal disease: Secondary | ICD-10-CM | POA: Diagnosis not present

## 2021-02-09 DIAGNOSIS — Z992 Dependence on renal dialysis: Secondary | ICD-10-CM | POA: Diagnosis not present

## 2021-02-10 DIAGNOSIS — N186 End stage renal disease: Secondary | ICD-10-CM | POA: Diagnosis not present

## 2021-02-10 DIAGNOSIS — Z992 Dependence on renal dialysis: Secondary | ICD-10-CM | POA: Diagnosis not present

## 2021-02-11 ENCOUNTER — Other Ambulatory Visit: Payer: Self-pay

## 2021-02-11 DIAGNOSIS — M25551 Pain in right hip: Secondary | ICD-10-CM

## 2021-02-11 DIAGNOSIS — N186 End stage renal disease: Secondary | ICD-10-CM | POA: Diagnosis not present

## 2021-02-11 DIAGNOSIS — Z992 Dependence on renal dialysis: Secondary | ICD-10-CM | POA: Diagnosis not present

## 2021-02-12 DIAGNOSIS — N186 End stage renal disease: Secondary | ICD-10-CM | POA: Diagnosis not present

## 2021-02-12 DIAGNOSIS — Z992 Dependence on renal dialysis: Secondary | ICD-10-CM | POA: Diagnosis not present

## 2021-02-13 ENCOUNTER — Telehealth: Payer: Self-pay | Admitting: Medical

## 2021-02-13 DIAGNOSIS — N186 End stage renal disease: Secondary | ICD-10-CM | POA: Diagnosis not present

## 2021-02-13 DIAGNOSIS — Z992 Dependence on renal dialysis: Secondary | ICD-10-CM | POA: Diagnosis not present

## 2021-02-13 NOTE — Telephone Encounter (Signed)
Lockie Mola NP ([facility]) Call back number: [phone]  Would like patient to be refer to a behavior Health for depression. Rubin Payor does not need a phone call back unless you have further questions.

## 2021-02-14 DIAGNOSIS — Z992 Dependence on renal dialysis: Secondary | ICD-10-CM | POA: Diagnosis not present

## 2021-02-14 DIAGNOSIS — N186 End stage renal disease: Secondary | ICD-10-CM | POA: Diagnosis not present

## 2021-02-14 NOTE — Addendum Note (Signed)
Addended by: Anabel Halon on: 02/14/2021 09:21 AM   Modules accepted: Orders

## 2021-02-14 NOTE — Telephone Encounter (Signed)
Called pt and he stated he has not looked at the paper but he will look today when he gets off and call us back today.

## 2021-02-15 DIAGNOSIS — Z992 Dependence on renal dialysis: Secondary | ICD-10-CM | POA: Diagnosis not present

## 2021-02-15 DIAGNOSIS — N186 End stage renal disease: Secondary | ICD-10-CM | POA: Diagnosis not present

## 2021-02-16 DIAGNOSIS — N186 End stage renal disease: Secondary | ICD-10-CM | POA: Diagnosis not present

## 2021-02-16 DIAGNOSIS — Z992 Dependence on renal dialysis: Secondary | ICD-10-CM | POA: Diagnosis not present

## 2021-02-17 DIAGNOSIS — N186 End stage renal disease: Secondary | ICD-10-CM | POA: Diagnosis not present

## 2021-02-17 DIAGNOSIS — Z992 Dependence on renal dialysis: Secondary | ICD-10-CM | POA: Diagnosis not present

## 2021-02-18 DIAGNOSIS — Z992 Dependence on renal dialysis: Secondary | ICD-10-CM | POA: Diagnosis not present

## 2021-02-18 DIAGNOSIS — N186 End stage renal disease: Secondary | ICD-10-CM | POA: Diagnosis not present

## 2021-02-19 DIAGNOSIS — Z992 Dependence on renal dialysis: Secondary | ICD-10-CM | POA: Diagnosis not present

## 2021-02-19 DIAGNOSIS — N186 End stage renal disease: Secondary | ICD-10-CM | POA: Diagnosis not present

## 2021-02-20 DIAGNOSIS — N186 End stage renal disease: Secondary | ICD-10-CM | POA: Diagnosis not present

## 2021-02-20 DIAGNOSIS — Z992 Dependence on renal dialysis: Secondary | ICD-10-CM | POA: Diagnosis not present

## 2021-02-20 DIAGNOSIS — D509 Iron deficiency anemia, unspecified: Secondary | ICD-10-CM | POA: Diagnosis not present

## 2021-02-21 ENCOUNTER — Ambulatory Visit (INDEPENDENT_AMBULATORY_CARE_PROVIDER_SITE_OTHER): Payer: Medicare HMO | Admitting: Family Medicine

## 2021-02-21 ENCOUNTER — Other Ambulatory Visit: Payer: Self-pay

## 2021-02-21 ENCOUNTER — Encounter: Payer: Self-pay | Admitting: Family Medicine

## 2021-02-21 VITALS — BP 92/70 | Ht 70.0 in | Wt 153.0 lb

## 2021-02-21 DIAGNOSIS — N186 End stage renal disease: Secondary | ICD-10-CM | POA: Diagnosis not present

## 2021-02-21 DIAGNOSIS — M76891 Other specified enthesopathies of right lower limb, excluding foot: Secondary | ICD-10-CM | POA: Diagnosis not present

## 2021-02-21 DIAGNOSIS — Z992 Dependence on renal dialysis: Secondary | ICD-10-CM | POA: Diagnosis not present

## 2021-02-21 NOTE — Patient Instructions (Signed)
Nice to meet you Please try the exercises  Please try heat   Please send me a message in MyChart with any questions or updates.  Please see [place] back as needed.   --Dr. Raeford Razor

## 2021-02-21 NOTE — Assessment & Plan Note (Signed)
Pain has improved and he is no longer having limitations in his range of motion or strength.  Seems more consistent with a tendinitis or muscle injury given the improvement.  Seems less likely for labral issue. -Counseled on home exercise therapy and supportive care. -Could consider physical therapy

## 2021-02-21 NOTE — Progress Notes (Signed)
  Derrick Moore - 55 y.o. male MRN [id number]  Date of birth: Sep 24, 1965  SUBJECTIVE:  Including CC & ROS.  No chief complaint on file.   Derrick Moore is a 55 y.o. male that is presenting with acute right hip pain.  The hip pain was proximal and lateral in nature.  He felt the pain after he was picking up a bookshelf at work.  No radicular type pain.  No swelling or ecchymosis.  He reports the pain is doing much better and he feels much stronger.  He initially had inability to flex his hip..  Independent review of the AP pelvis from 7/16 and right hip shows no acute changes.   Review of Systems See HPI   HISTORY: Past Medical, Surgical, Social, and Family History Reviewed & Updated per EMR.   Pertinent Historical Findings include:  Past Medical History:  Diagnosis Date   Dialysis patient [facility])    Hyperlipidemia    Hypertension    Renal failure    Renal failure    Stroke [facility])     History reviewed. No pertinent surgical history.  History reviewed. No pertinent family history.  Social History   Socioeconomic History   Marital status: Single    Spouse name: Not on file   Number of children: Not on file   Years of education: Not on file   Highest education level: Not on file  Occupational History   Not on file  Tobacco Use   Smoking status: Some Days    Packs/day: 0.25    Years: 33.00    Pack years: 8.25    Types: Cigarettes   Smokeless tobacco: Never  Vaping Use   Vaping Use: Never used  Substance and Sexual Activity   Alcohol use: Yes    Alcohol/week: 1.0 standard drink    Types: 1 Cans of beer per week    Comment: 1 beer a day at most on average. 12 pack in 2 weeks.   Drug use: Yes    Frequency: 7.0 times per week    Types: Marijuana    Comment: smoke daily. helps his appetite.   Sexual activity: Not on file  Other Topics Concern   Not on file  Social History Narrative   Not on file   Social Determinants of Health   Financial Resource  Strain: Not on file  Food Insecurity: Not on file  Transportation Needs: Not on file  Physical Activity: Not on file  Stress: Not on file  Social Connections: Not on file  Intimate Partner Violence: Not on file     PHYSICAL EXAM:  VS: BP 92/70 (BP Location: Left Arm, Patient Position: Sitting, Cuff Size: Normal)   Ht '5\' 10"'$  (1.778 m)   Wt 153 lb (69.4 kg)   BMI 21.95 kg/m  Physical Exam Gen: NAD, alert, cooperative with exam, well-appearing MSK:  Right hip: Normal range of motion. Normal strength resistance. No tender to palpation of the ASIS. No tenderness palpation of the greater trochanter. Neurovascularly intact     ASSESSMENT & PLAN:   Hip flexor tendinitis, right Pain has improved and he is no longer having limitations in his range of motion or strength.  Seems more consistent with a tendinitis or muscle injury given the improvement.  Seems less likely for labral issue. -Counseled on home exercise therapy and supportive care. -Could consider physical therapy

## 2021-02-22 DIAGNOSIS — N186 End stage renal disease: Secondary | ICD-10-CM | POA: Diagnosis not present

## 2021-02-22 DIAGNOSIS — Z992 Dependence on renal dialysis: Secondary | ICD-10-CM | POA: Diagnosis not present

## 2021-02-23 DIAGNOSIS — N186 End stage renal disease: Secondary | ICD-10-CM | POA: Diagnosis not present

## 2021-02-23 DIAGNOSIS — Z992 Dependence on renal dialysis: Secondary | ICD-10-CM | POA: Diagnosis not present

## 2021-02-24 DIAGNOSIS — N186 End stage renal disease: Secondary | ICD-10-CM | POA: Diagnosis not present

## 2021-02-24 DIAGNOSIS — Z992 Dependence on renal dialysis: Secondary | ICD-10-CM | POA: Diagnosis not present

## 2021-02-25 DIAGNOSIS — N186 End stage renal disease: Secondary | ICD-10-CM | POA: Diagnosis not present

## 2021-02-25 DIAGNOSIS — Z992 Dependence on renal dialysis: Secondary | ICD-10-CM | POA: Diagnosis not present

## 2021-02-26 DIAGNOSIS — N186 End stage renal disease: Secondary | ICD-10-CM | POA: Diagnosis not present

## 2021-02-26 DIAGNOSIS — Z992 Dependence on renal dialysis: Secondary | ICD-10-CM | POA: Diagnosis not present

## 2021-02-27 DIAGNOSIS — Z992 Dependence on renal dialysis: Secondary | ICD-10-CM | POA: Diagnosis not present

## 2021-02-27 DIAGNOSIS — N186 End stage renal disease: Secondary | ICD-10-CM | POA: Diagnosis not present

## 2021-02-27 DIAGNOSIS — D509 Iron deficiency anemia, unspecified: Secondary | ICD-10-CM | POA: Diagnosis not present

## 2021-02-28 DIAGNOSIS — Z992 Dependence on renal dialysis: Secondary | ICD-10-CM | POA: Diagnosis not present

## 2021-02-28 DIAGNOSIS — N186 End stage renal disease: Secondary | ICD-10-CM | POA: Diagnosis not present

## 2021-03-01 DIAGNOSIS — Z992 Dependence on renal dialysis: Secondary | ICD-10-CM | POA: Diagnosis not present

## 2021-03-01 DIAGNOSIS — N186 End stage renal disease: Secondary | ICD-10-CM | POA: Diagnosis not present

## 2021-03-02 DIAGNOSIS — N186 End stage renal disease: Secondary | ICD-10-CM | POA: Diagnosis not present

## 2021-03-02 DIAGNOSIS — Z992 Dependence on renal dialysis: Secondary | ICD-10-CM | POA: Diagnosis not present

## 2021-03-03 DIAGNOSIS — Z992 Dependence on renal dialysis: Secondary | ICD-10-CM | POA: Diagnosis not present

## 2021-03-03 DIAGNOSIS — N186 End stage renal disease: Secondary | ICD-10-CM | POA: Diagnosis not present

## 2021-03-04 DIAGNOSIS — Z992 Dependence on renal dialysis: Secondary | ICD-10-CM | POA: Diagnosis not present

## 2021-03-04 DIAGNOSIS — N186 End stage renal disease: Secondary | ICD-10-CM | POA: Diagnosis not present

## 2021-03-05 DIAGNOSIS — Z992 Dependence on renal dialysis: Secondary | ICD-10-CM | POA: Diagnosis not present

## 2021-03-05 DIAGNOSIS — N186 End stage renal disease: Secondary | ICD-10-CM | POA: Diagnosis not present

## 2021-03-06 DIAGNOSIS — N186 End stage renal disease: Secondary | ICD-10-CM | POA: Diagnosis not present

## 2021-03-06 DIAGNOSIS — Z992 Dependence on renal dialysis: Secondary | ICD-10-CM | POA: Diagnosis not present

## 2021-03-07 DIAGNOSIS — Z992 Dependence on renal dialysis: Secondary | ICD-10-CM | POA: Diagnosis not present

## 2021-03-07 DIAGNOSIS — N186 End stage renal disease: Secondary | ICD-10-CM | POA: Diagnosis not present

## 2021-03-08 ENCOUNTER — Other Ambulatory Visit: Payer: Self-pay

## 2021-03-08 ENCOUNTER — Ambulatory Visit (INDEPENDENT_AMBULATORY_CARE_PROVIDER_SITE_OTHER): Payer: Medicare HMO | Admitting: Medical

## 2021-03-08 VITALS — BP 110/70 | HR 78 | Resp 18 | Ht 70.0 in | Wt 155.0 lb

## 2021-03-08 DIAGNOSIS — N186 End stage renal disease: Secondary | ICD-10-CM | POA: Diagnosis not present

## 2021-03-08 DIAGNOSIS — I10 Essential (primary) hypertension: Secondary | ICD-10-CM

## 2021-03-08 DIAGNOSIS — F32A Depression, unspecified: Secondary | ICD-10-CM

## 2021-03-08 DIAGNOSIS — R69 Illness, unspecified: Secondary | ICD-10-CM | POA: Diagnosis not present

## 2021-03-08 DIAGNOSIS — Z992 Dependence on renal dialysis: Secondary | ICD-10-CM

## 2021-03-08 DIAGNOSIS — L709 Acne, unspecified: Secondary | ICD-10-CM

## 2021-03-08 DIAGNOSIS — F419 Anxiety disorder, unspecified: Secondary | ICD-10-CM

## 2021-03-08 MED ORDER — SERTRALINE HCL 50 MG PO TABS: 50.0000 mg | ORAL_TABLET | Freq: Every day | ORAL | 3 refills | Status: DC

## 2021-03-08 MED ORDER — DOXYCYCLINE HYCLATE 100 MG PO TABS: 100.0000 mg | ORAL_TABLET | Freq: Two times a day (BID) | ORAL | 0 refills | Status: DC

## 2021-03-08 NOTE — Patient Instructions (Addendum)
Hx of htn. Tightly controlled and on 4 med regimen. Also getting dialysis for CKD. If you feel light headed on standing check bp as want to know if bp staying low. If so may need dose modification of current meds. Let us know and you could also run numbers by dialysis center.  Depression and anxiety improved but not ideal. Increase you sertraline to 50 mg daily. I am asking staff to get name of psychiatirst we sent your referral to.  For acne on back rx doxycycline.  For ckd continue on daily dialysis.   Follow up in one month or sooner if needed.

## 2021-03-08 NOTE — Progress Notes (Signed)
Subjective:    Patient ID: Derrick Moore, male    DOB: 04/10/1966, 55 y.o.   MRN: [id number]  HPI  Pt in for follow up.  I had given pt information on psychiatrist, behavioral health and counseling. He did not pick one or call. Looks our referral staff did send over information to Dr. Erling Cruz psychiatrist office.  Pt has hx of anxiety and depression. On last visit I had given him setrraline 25 mg low dose. Pt states he feels less depressed. He states exercising some and not focusing on negative life events in past. No side effects. He is ok with increasing.   Pt states his rt hip pain is feeling better. Xray showed. IMPRESSION: No acute osseous abnormality. Since pain resolved with less work activity pt thinks muscle pain.  Pt also mentions some bumps on back. Just felt 2 weeks ago.   Pt feet pain also resolved as his hours were cut.   Review of Systems  Constitutional:  Negative for chills, fatigue and fever.  Respiratory:  Negative for cough, chest tightness, shortness of breath and wheezing.   Cardiovascular:  Negative for chest pain and palpitations.  Gastrointestinal:  Negative for abdominal distention, anal bleeding, blood in stool, diarrhea, nausea and vomiting.  Genitourinary:  Negative for difficulty urinating, flank pain and frequency.  Musculoskeletal:        See hpi.  Skin:        See hpi.  Psychiatric/Behavioral:  Positive for dysphoric mood. Negative for behavioral problems, decreased concentration, self-injury, sleep disturbance and suicidal ideas. The patient is nervous/anxious.        Improvement with low dose sertraline.    Past Medical History:  Diagnosis Date   Dialysis patient [facility])    Hyperlipidemia    Hypertension    Renal failure    Renal failure    Stroke [facility])      Social History   Socioeconomic History   Marital status: Single    Spouse name: Not on file   Number of children: Not on file   Years of education: Not on file   Highest  education level: Not on file  Occupational History   Not on file  Tobacco Use   Smoking status: Some Days    Packs/day: 0.25    Years: 33.00    Pack years: 8.25    Types: Cigarettes   Smokeless tobacco: Never  Vaping Use   Vaping Use: Never used  Substance and Sexual Activity   Alcohol use: Yes    Alcohol/week: 1.0 standard drink    Types: 1 Cans of beer per week    Comment: 1 beer a day at most on average. 12 pack in 2 weeks.   Drug use: Yes    Frequency: 7.0 times per week    Types: Marijuana    Comment: smoke daily. helps his appetite.   Sexual activity: Not on file  Other Topics Concern   Not on file  Social History Narrative   Not on file   Social Determinants of Health   Financial Resource Strain: Not on file  Food Insecurity: Not on file  Transportation Needs: Not on file  Physical Activity: Not on file  Stress: Not on file  Social Connections: Not on file  Intimate Partner Violence: Not on file    No past surgical history on file.  No family history on file.  Allergies  Allergen Reactions   No Known Allergies     Current Outpatient Medications  on File Prior to Visit  Medication Sig Dispense Refill   amLODipine (NORVASC) 5 MG tablet Take 5 mg by mouth at bedtime.     clopidogrel (PLAVIX) 75 MG tablet Take 75 mg by mouth daily.     hydrALAZINE (APRESOLINE) 50 MG tablet Take 50 mg by mouth 2 (two) times daily.     JUBLIA 10 % SOLN Apply topically.     labetalol (NORMODYNE) 200 MG tablet Take 200 mg by mouth 2 (two) times daily.     LASIX 40 MG tablet Take 40 mg by mouth every morning.     lisinopril (ZESTRIL) 30 MG tablet Take 30 mg by mouth at bedtime.     pravastatin (PRAVACHOL) 10 MG tablet Take 10 mg by mouth daily.     RAYOS 5 MG TBEC Take 1 tablet by mouth daily.     sertraline (ZOLOFT) 25 MG tablet Take 1 tablet (25 mg total) by mouth daily. 30 tablet 3   sildenafil (VIAGRA) 50 MG tablet Take 1 tablet (50 mg total) by mouth daily as needed for  erectile dysfunction. 10 tablet 0   No current facility-administered medications on file prior to visit.    BP 109/74   Pulse 78   Resp 18   Ht '5\' 10"'$  (1.778 m)   Wt 155 lb (70.3 kg)   SpO2 98%   BMI 22.24 kg/m       Objective:   Physical Exam  General- No acute distress. Pleasant patient. Neck- Full range of motion, no jvd Lungs- Clear, even and unlabored. Heart- regular rate and rhythm. Neurologic- CNII- XII grossly intact.  Skin- moderate acnes in upper back with some old scars/hyperpigemnted spots through lower and mid back.      Assessment & Plan:   Hx of htn. Tightly controlled and on 4 med regimen. Also getting dialysis for CKD. If you feel light headed on standing check bp as want to know if bp staying low. If so may need dose modification of current meds. Let us know and you could also run numbers by dialysis center.  Depression and anxiety improved but not ideal. Increase you sertraline to 50 mg daily. I am asking staff to get name of psychiatirst we sent your referral to.  For acne on back rx doxycycline.  For ckd continue on daily dialysis.   Follow up in one month or sooner if needed.  [name], PA-C

## 2021-03-09 DIAGNOSIS — Z992 Dependence on renal dialysis: Secondary | ICD-10-CM | POA: Diagnosis not present

## 2021-03-09 DIAGNOSIS — N186 End stage renal disease: Secondary | ICD-10-CM | POA: Diagnosis not present

## 2021-03-10 DIAGNOSIS — Z992 Dependence on renal dialysis: Secondary | ICD-10-CM | POA: Diagnosis not present

## 2021-03-10 DIAGNOSIS — N186 End stage renal disease: Secondary | ICD-10-CM | POA: Diagnosis not present

## 2021-03-11 DIAGNOSIS — Z992 Dependence on renal dialysis: Secondary | ICD-10-CM | POA: Diagnosis not present

## 2021-03-11 DIAGNOSIS — N186 End stage renal disease: Secondary | ICD-10-CM | POA: Diagnosis not present

## 2021-03-12 DIAGNOSIS — Z992 Dependence on renal dialysis: Secondary | ICD-10-CM | POA: Diagnosis not present

## 2021-03-12 DIAGNOSIS — N186 End stage renal disease: Secondary | ICD-10-CM | POA: Diagnosis not present

## 2021-03-13 DIAGNOSIS — N186 End stage renal disease: Secondary | ICD-10-CM | POA: Diagnosis not present

## 2021-03-13 DIAGNOSIS — Z992 Dependence on renal dialysis: Secondary | ICD-10-CM | POA: Diagnosis not present

## 2021-03-14 DIAGNOSIS — Z992 Dependence on renal dialysis: Secondary | ICD-10-CM | POA: Diagnosis not present

## 2021-03-14 DIAGNOSIS — N186 End stage renal disease: Secondary | ICD-10-CM | POA: Diagnosis not present

## 2021-03-15 DIAGNOSIS — N186 End stage renal disease: Secondary | ICD-10-CM | POA: Diagnosis not present

## 2021-03-15 DIAGNOSIS — Z992 Dependence on renal dialysis: Secondary | ICD-10-CM | POA: Diagnosis not present

## 2021-03-16 DIAGNOSIS — N186 End stage renal disease: Secondary | ICD-10-CM | POA: Diagnosis not present

## 2021-03-16 DIAGNOSIS — Z992 Dependence on renal dialysis: Secondary | ICD-10-CM | POA: Diagnosis not present

## 2021-03-17 DIAGNOSIS — N186 End stage renal disease: Secondary | ICD-10-CM | POA: Diagnosis not present

## 2021-03-17 DIAGNOSIS — Z992 Dependence on renal dialysis: Secondary | ICD-10-CM | POA: Diagnosis not present

## 2021-03-18 DIAGNOSIS — N186 End stage renal disease: Secondary | ICD-10-CM | POA: Diagnosis not present

## 2021-03-18 DIAGNOSIS — Z992 Dependence on renal dialysis: Secondary | ICD-10-CM | POA: Diagnosis not present

## 2021-03-19 DIAGNOSIS — N186 End stage renal disease: Secondary | ICD-10-CM | POA: Diagnosis not present

## 2021-03-19 DIAGNOSIS — Z992 Dependence on renal dialysis: Secondary | ICD-10-CM | POA: Diagnosis not present

## 2021-03-20 DIAGNOSIS — Z992 Dependence on renal dialysis: Secondary | ICD-10-CM | POA: Diagnosis not present

## 2021-03-20 DIAGNOSIS — N186 End stage renal disease: Secondary | ICD-10-CM | POA: Diagnosis not present

## 2021-03-21 DIAGNOSIS — N186 End stage renal disease: Secondary | ICD-10-CM | POA: Diagnosis not present

## 2021-03-21 DIAGNOSIS — Z992 Dependence on renal dialysis: Secondary | ICD-10-CM | POA: Diagnosis not present

## 2021-03-22 DIAGNOSIS — Z992 Dependence on renal dialysis: Secondary | ICD-10-CM | POA: Diagnosis not present

## 2021-03-22 DIAGNOSIS — N186 End stage renal disease: Secondary | ICD-10-CM | POA: Diagnosis not present

## 2021-03-23 DIAGNOSIS — Z992 Dependence on renal dialysis: Secondary | ICD-10-CM | POA: Diagnosis not present

## 2021-03-23 DIAGNOSIS — N186 End stage renal disease: Secondary | ICD-10-CM | POA: Diagnosis not present

## 2021-03-24 DIAGNOSIS — N186 End stage renal disease: Secondary | ICD-10-CM | POA: Diagnosis not present

## 2021-03-24 DIAGNOSIS — Z992 Dependence on renal dialysis: Secondary | ICD-10-CM | POA: Diagnosis not present

## 2021-03-25 DIAGNOSIS — Z992 Dependence on renal dialysis: Secondary | ICD-10-CM | POA: Diagnosis not present

## 2021-03-25 DIAGNOSIS — N186 End stage renal disease: Secondary | ICD-10-CM | POA: Diagnosis not present

## 2021-03-26 DIAGNOSIS — Z992 Dependence on renal dialysis: Secondary | ICD-10-CM | POA: Diagnosis not present

## 2021-03-26 DIAGNOSIS — N186 End stage renal disease: Secondary | ICD-10-CM | POA: Diagnosis not present

## 2021-03-27 DIAGNOSIS — Z992 Dependence on renal dialysis: Secondary | ICD-10-CM | POA: Diagnosis not present

## 2021-03-27 DIAGNOSIS — N186 End stage renal disease: Secondary | ICD-10-CM | POA: Diagnosis not present

## 2021-03-28 DIAGNOSIS — Z992 Dependence on renal dialysis: Secondary | ICD-10-CM | POA: Diagnosis not present

## 2021-03-28 DIAGNOSIS — N186 End stage renal disease: Secondary | ICD-10-CM | POA: Diagnosis not present

## 2021-03-29 DIAGNOSIS — N186 End stage renal disease: Secondary | ICD-10-CM | POA: Diagnosis not present

## 2021-03-29 DIAGNOSIS — Z992 Dependence on renal dialysis: Secondary | ICD-10-CM | POA: Diagnosis not present

## 2021-03-30 DIAGNOSIS — N186 End stage renal disease: Secondary | ICD-10-CM | POA: Diagnosis not present

## 2021-03-30 DIAGNOSIS — Z992 Dependence on renal dialysis: Secondary | ICD-10-CM | POA: Diagnosis not present

## 2021-03-31 DIAGNOSIS — Z992 Dependence on renal dialysis: Secondary | ICD-10-CM | POA: Diagnosis not present

## 2021-03-31 DIAGNOSIS — N186 End stage renal disease: Secondary | ICD-10-CM | POA: Diagnosis not present

## 2021-04-01 DIAGNOSIS — N186 End stage renal disease: Secondary | ICD-10-CM | POA: Diagnosis not present

## 2021-04-01 DIAGNOSIS — Z992 Dependence on renal dialysis: Secondary | ICD-10-CM | POA: Diagnosis not present

## 2021-04-02 DIAGNOSIS — Z992 Dependence on renal dialysis: Secondary | ICD-10-CM | POA: Diagnosis not present

## 2021-04-02 DIAGNOSIS — N186 End stage renal disease: Secondary | ICD-10-CM | POA: Diagnosis not present

## 2021-04-03 DIAGNOSIS — N186 End stage renal disease: Secondary | ICD-10-CM | POA: Diagnosis not present

## 2021-04-03 DIAGNOSIS — Z992 Dependence on renal dialysis: Secondary | ICD-10-CM | POA: Diagnosis not present

## 2021-04-04 DIAGNOSIS — N186 End stage renal disease: Secondary | ICD-10-CM | POA: Diagnosis not present

## 2021-04-04 DIAGNOSIS — Z992 Dependence on renal dialysis: Secondary | ICD-10-CM | POA: Diagnosis not present

## 2021-04-05 DIAGNOSIS — N186 End stage renal disease: Secondary | ICD-10-CM | POA: Diagnosis not present

## 2021-04-05 DIAGNOSIS — Z992 Dependence on renal dialysis: Secondary | ICD-10-CM | POA: Diagnosis not present

## 2021-04-06 DIAGNOSIS — Z992 Dependence on renal dialysis: Secondary | ICD-10-CM | POA: Diagnosis not present

## 2021-04-06 DIAGNOSIS — N186 End stage renal disease: Secondary | ICD-10-CM | POA: Diagnosis not present

## 2021-04-07 DIAGNOSIS — Z992 Dependence on renal dialysis: Secondary | ICD-10-CM | POA: Diagnosis not present

## 2021-04-07 DIAGNOSIS — N186 End stage renal disease: Secondary | ICD-10-CM | POA: Diagnosis not present

## 2021-04-08 DIAGNOSIS — Z992 Dependence on renal dialysis: Secondary | ICD-10-CM | POA: Diagnosis not present

## 2021-04-08 DIAGNOSIS — N186 End stage renal disease: Secondary | ICD-10-CM | POA: Diagnosis not present

## 2021-04-09 DIAGNOSIS — N186 End stage renal disease: Secondary | ICD-10-CM | POA: Diagnosis not present

## 2021-04-09 DIAGNOSIS — Z992 Dependence on renal dialysis: Secondary | ICD-10-CM | POA: Diagnosis not present

## 2021-04-10 DIAGNOSIS — Z992 Dependence on renal dialysis: Secondary | ICD-10-CM | POA: Diagnosis not present

## 2021-04-10 DIAGNOSIS — N186 End stage renal disease: Secondary | ICD-10-CM | POA: Diagnosis not present

## 2021-04-11 DIAGNOSIS — N186 End stage renal disease: Secondary | ICD-10-CM | POA: Diagnosis not present

## 2021-04-11 DIAGNOSIS — Z992 Dependence on renal dialysis: Secondary | ICD-10-CM | POA: Diagnosis not present

## 2021-04-11 DIAGNOSIS — D509 Iron deficiency anemia, unspecified: Secondary | ICD-10-CM | POA: Diagnosis not present

## 2021-04-12 DIAGNOSIS — N186 End stage renal disease: Secondary | ICD-10-CM | POA: Diagnosis not present

## 2021-04-12 DIAGNOSIS — Z992 Dependence on renal dialysis: Secondary | ICD-10-CM | POA: Diagnosis not present

## 2021-04-13 DIAGNOSIS — Z992 Dependence on renal dialysis: Secondary | ICD-10-CM | POA: Diagnosis not present

## 2021-04-13 DIAGNOSIS — N186 End stage renal disease: Secondary | ICD-10-CM | POA: Diagnosis not present

## 2021-04-14 DIAGNOSIS — N186 End stage renal disease: Secondary | ICD-10-CM | POA: Diagnosis not present

## 2021-04-14 DIAGNOSIS — Z992 Dependence on renal dialysis: Secondary | ICD-10-CM | POA: Diagnosis not present

## 2021-04-15 DIAGNOSIS — N186 End stage renal disease: Secondary | ICD-10-CM | POA: Diagnosis not present

## 2021-04-15 DIAGNOSIS — Z992 Dependence on renal dialysis: Secondary | ICD-10-CM | POA: Diagnosis not present

## 2021-04-16 DIAGNOSIS — N186 End stage renal disease: Secondary | ICD-10-CM | POA: Diagnosis not present

## 2021-04-16 DIAGNOSIS — Z992 Dependence on renal dialysis: Secondary | ICD-10-CM | POA: Diagnosis not present

## 2021-04-17 DIAGNOSIS — N186 End stage renal disease: Secondary | ICD-10-CM | POA: Diagnosis not present

## 2021-04-17 DIAGNOSIS — Z992 Dependence on renal dialysis: Secondary | ICD-10-CM | POA: Diagnosis not present

## 2021-04-18 DIAGNOSIS — N186 End stage renal disease: Secondary | ICD-10-CM | POA: Diagnosis not present

## 2021-04-18 DIAGNOSIS — Z992 Dependence on renal dialysis: Secondary | ICD-10-CM | POA: Diagnosis not present

## 2021-04-19 DIAGNOSIS — Z992 Dependence on renal dialysis: Secondary | ICD-10-CM | POA: Diagnosis not present

## 2021-04-19 DIAGNOSIS — N186 End stage renal disease: Secondary | ICD-10-CM | POA: Diagnosis not present

## 2021-04-20 DIAGNOSIS — N186 End stage renal disease: Secondary | ICD-10-CM | POA: Diagnosis not present

## 2021-04-20 DIAGNOSIS — Z992 Dependence on renal dialysis: Secondary | ICD-10-CM | POA: Diagnosis not present

## 2021-04-21 DIAGNOSIS — Z992 Dependence on renal dialysis: Secondary | ICD-10-CM | POA: Diagnosis not present

## 2021-04-21 DIAGNOSIS — N186 End stage renal disease: Secondary | ICD-10-CM | POA: Diagnosis not present

## 2021-04-22 DIAGNOSIS — N186 End stage renal disease: Secondary | ICD-10-CM | POA: Diagnosis not present

## 2021-04-22 DIAGNOSIS — Z992 Dependence on renal dialysis: Secondary | ICD-10-CM | POA: Diagnosis not present

## 2021-04-23 DIAGNOSIS — N186 End stage renal disease: Secondary | ICD-10-CM | POA: Diagnosis not present

## 2021-04-23 DIAGNOSIS — Z992 Dependence on renal dialysis: Secondary | ICD-10-CM | POA: Diagnosis not present

## 2021-04-24 DIAGNOSIS — N186 End stage renal disease: Secondary | ICD-10-CM | POA: Diagnosis not present

## 2021-04-24 DIAGNOSIS — Z992 Dependence on renal dialysis: Secondary | ICD-10-CM | POA: Diagnosis not present

## 2021-04-25 DIAGNOSIS — Z992 Dependence on renal dialysis: Secondary | ICD-10-CM | POA: Diagnosis not present

## 2021-04-25 DIAGNOSIS — N186 End stage renal disease: Secondary | ICD-10-CM | POA: Diagnosis not present

## 2021-04-26 DIAGNOSIS — N186 End stage renal disease: Secondary | ICD-10-CM | POA: Diagnosis not present

## 2021-04-26 DIAGNOSIS — Z992 Dependence on renal dialysis: Secondary | ICD-10-CM | POA: Diagnosis not present

## 2021-04-27 DIAGNOSIS — Z992 Dependence on renal dialysis: Secondary | ICD-10-CM | POA: Diagnosis not present

## 2021-04-27 DIAGNOSIS — N186 End stage renal disease: Secondary | ICD-10-CM | POA: Diagnosis not present

## 2021-04-28 DIAGNOSIS — Z992 Dependence on renal dialysis: Secondary | ICD-10-CM | POA: Diagnosis not present

## 2021-04-28 DIAGNOSIS — N186 End stage renal disease: Secondary | ICD-10-CM | POA: Diagnosis not present

## 2021-04-29 DIAGNOSIS — N186 End stage renal disease: Secondary | ICD-10-CM | POA: Diagnosis not present

## 2021-04-29 DIAGNOSIS — Z992 Dependence on renal dialysis: Secondary | ICD-10-CM | POA: Diagnosis not present

## 2021-04-30 DIAGNOSIS — N186 End stage renal disease: Secondary | ICD-10-CM | POA: Diagnosis not present

## 2021-04-30 DIAGNOSIS — Z992 Dependence on renal dialysis: Secondary | ICD-10-CM | POA: Diagnosis not present

## 2021-05-01 DIAGNOSIS — Z992 Dependence on renal dialysis: Secondary | ICD-10-CM | POA: Diagnosis not present

## 2021-05-01 DIAGNOSIS — N186 End stage renal disease: Secondary | ICD-10-CM | POA: Diagnosis not present

## 2021-05-02 DIAGNOSIS — N186 End stage renal disease: Secondary | ICD-10-CM | POA: Diagnosis not present

## 2021-05-02 DIAGNOSIS — Z992 Dependence on renal dialysis: Secondary | ICD-10-CM | POA: Diagnosis not present

## 2021-05-03 DIAGNOSIS — N186 End stage renal disease: Secondary | ICD-10-CM | POA: Diagnosis not present

## 2021-05-03 DIAGNOSIS — Z992 Dependence on renal dialysis: Secondary | ICD-10-CM | POA: Diagnosis not present

## 2021-05-04 DIAGNOSIS — Z992 Dependence on renal dialysis: Secondary | ICD-10-CM | POA: Diagnosis not present

## 2021-05-04 DIAGNOSIS — N186 End stage renal disease: Secondary | ICD-10-CM | POA: Diagnosis not present

## 2021-05-05 DIAGNOSIS — Z992 Dependence on renal dialysis: Secondary | ICD-10-CM | POA: Diagnosis not present

## 2021-05-05 DIAGNOSIS — N186 End stage renal disease: Secondary | ICD-10-CM | POA: Diagnosis not present

## 2021-05-06 DIAGNOSIS — Z992 Dependence on renal dialysis: Secondary | ICD-10-CM | POA: Diagnosis not present

## 2021-05-06 DIAGNOSIS — N186 End stage renal disease: Secondary | ICD-10-CM | POA: Diagnosis not present

## 2021-05-07 DIAGNOSIS — N186 End stage renal disease: Secondary | ICD-10-CM | POA: Diagnosis not present

## 2021-05-07 DIAGNOSIS — Z992 Dependence on renal dialysis: Secondary | ICD-10-CM | POA: Diagnosis not present

## 2021-05-08 DIAGNOSIS — Z992 Dependence on renal dialysis: Secondary | ICD-10-CM | POA: Diagnosis not present

## 2021-05-08 DIAGNOSIS — N186 End stage renal disease: Secondary | ICD-10-CM | POA: Diagnosis not present

## 2021-05-09 DIAGNOSIS — Z992 Dependence on renal dialysis: Secondary | ICD-10-CM | POA: Diagnosis not present

## 2021-05-09 DIAGNOSIS — N186 End stage renal disease: Secondary | ICD-10-CM | POA: Diagnosis not present

## 2021-05-10 DIAGNOSIS — N186 End stage renal disease: Secondary | ICD-10-CM | POA: Diagnosis not present

## 2021-05-10 DIAGNOSIS — Z992 Dependence on renal dialysis: Secondary | ICD-10-CM | POA: Diagnosis not present

## 2021-05-11 DIAGNOSIS — Z992 Dependence on renal dialysis: Secondary | ICD-10-CM | POA: Diagnosis not present

## 2021-05-11 DIAGNOSIS — N186 End stage renal disease: Secondary | ICD-10-CM | POA: Diagnosis not present

## 2021-05-12 DIAGNOSIS — Z992 Dependence on renal dialysis: Secondary | ICD-10-CM | POA: Diagnosis not present

## 2021-05-12 DIAGNOSIS — N186 End stage renal disease: Secondary | ICD-10-CM | POA: Diagnosis not present

## 2021-05-13 DIAGNOSIS — Z992 Dependence on renal dialysis: Secondary | ICD-10-CM | POA: Diagnosis not present

## 2021-05-13 DIAGNOSIS — N186 End stage renal disease: Secondary | ICD-10-CM | POA: Diagnosis not present

## 2021-05-14 DIAGNOSIS — D509 Iron deficiency anemia, unspecified: Secondary | ICD-10-CM | POA: Diagnosis not present

## 2021-05-14 DIAGNOSIS — N186 End stage renal disease: Secondary | ICD-10-CM | POA: Diagnosis not present

## 2021-05-14 DIAGNOSIS — Z992 Dependence on renal dialysis: Secondary | ICD-10-CM | POA: Diagnosis not present

## 2021-05-15 DIAGNOSIS — Z992 Dependence on renal dialysis: Secondary | ICD-10-CM | POA: Diagnosis not present

## 2021-05-15 DIAGNOSIS — N186 End stage renal disease: Secondary | ICD-10-CM | POA: Diagnosis not present

## 2021-05-16 DIAGNOSIS — Z992 Dependence on renal dialysis: Secondary | ICD-10-CM | POA: Diagnosis not present

## 2021-05-16 DIAGNOSIS — N186 End stage renal disease: Secondary | ICD-10-CM | POA: Diagnosis not present

## 2021-05-17 DIAGNOSIS — N186 End stage renal disease: Secondary | ICD-10-CM | POA: Diagnosis not present

## 2021-05-17 DIAGNOSIS — Z992 Dependence on renal dialysis: Secondary | ICD-10-CM | POA: Diagnosis not present

## 2021-05-18 DIAGNOSIS — N186 End stage renal disease: Secondary | ICD-10-CM | POA: Diagnosis not present

## 2021-05-18 DIAGNOSIS — Z992 Dependence on renal dialysis: Secondary | ICD-10-CM | POA: Diagnosis not present

## 2021-05-19 DIAGNOSIS — Z992 Dependence on renal dialysis: Secondary | ICD-10-CM | POA: Diagnosis not present

## 2021-05-19 DIAGNOSIS — N186 End stage renal disease: Secondary | ICD-10-CM | POA: Diagnosis not present

## 2021-05-20 DIAGNOSIS — N186 End stage renal disease: Secondary | ICD-10-CM | POA: Diagnosis not present

## 2021-05-20 DIAGNOSIS — Z992 Dependence on renal dialysis: Secondary | ICD-10-CM | POA: Diagnosis not present

## 2021-05-21 DIAGNOSIS — Z992 Dependence on renal dialysis: Secondary | ICD-10-CM | POA: Diagnosis not present

## 2021-05-21 DIAGNOSIS — N186 End stage renal disease: Secondary | ICD-10-CM | POA: Diagnosis not present

## 2021-05-22 DIAGNOSIS — N186 End stage renal disease: Secondary | ICD-10-CM | POA: Diagnosis not present

## 2021-05-22 DIAGNOSIS — Z992 Dependence on renal dialysis: Secondary | ICD-10-CM | POA: Diagnosis not present

## 2021-05-23 DIAGNOSIS — Z992 Dependence on renal dialysis: Secondary | ICD-10-CM | POA: Diagnosis not present

## 2021-05-23 DIAGNOSIS — N186 End stage renal disease: Secondary | ICD-10-CM | POA: Diagnosis not present

## 2021-05-24 DIAGNOSIS — Z992 Dependence on renal dialysis: Secondary | ICD-10-CM | POA: Diagnosis not present

## 2021-05-24 DIAGNOSIS — N186 End stage renal disease: Secondary | ICD-10-CM | POA: Diagnosis not present

## 2021-05-25 DIAGNOSIS — Z992 Dependence on renal dialysis: Secondary | ICD-10-CM | POA: Diagnosis not present

## 2021-05-25 DIAGNOSIS — N186 End stage renal disease: Secondary | ICD-10-CM | POA: Diagnosis not present

## 2021-05-26 DIAGNOSIS — Z992 Dependence on renal dialysis: Secondary | ICD-10-CM | POA: Diagnosis not present

## 2021-05-26 DIAGNOSIS — N186 End stage renal disease: Secondary | ICD-10-CM | POA: Diagnosis not present

## 2021-05-27 DIAGNOSIS — Z992 Dependence on renal dialysis: Secondary | ICD-10-CM | POA: Diagnosis not present

## 2021-05-27 DIAGNOSIS — N186 End stage renal disease: Secondary | ICD-10-CM | POA: Diagnosis not present

## 2021-05-28 DIAGNOSIS — N186 End stage renal disease: Secondary | ICD-10-CM | POA: Diagnosis not present

## 2021-05-28 DIAGNOSIS — Z992 Dependence on renal dialysis: Secondary | ICD-10-CM | POA: Diagnosis not present

## 2021-05-29 DIAGNOSIS — N186 End stage renal disease: Secondary | ICD-10-CM | POA: Diagnosis not present

## 2021-05-29 DIAGNOSIS — Z992 Dependence on renal dialysis: Secondary | ICD-10-CM | POA: Diagnosis not present

## 2021-05-30 DIAGNOSIS — Z992 Dependence on renal dialysis: Secondary | ICD-10-CM | POA: Diagnosis not present

## 2021-05-30 DIAGNOSIS — D509 Iron deficiency anemia, unspecified: Secondary | ICD-10-CM | POA: Diagnosis not present

## 2021-05-30 DIAGNOSIS — N186 End stage renal disease: Secondary | ICD-10-CM | POA: Diagnosis not present

## 2021-05-31 DIAGNOSIS — N186 End stage renal disease: Secondary | ICD-10-CM | POA: Diagnosis not present

## 2021-05-31 DIAGNOSIS — Z992 Dependence on renal dialysis: Secondary | ICD-10-CM | POA: Diagnosis not present

## 2021-06-01 DIAGNOSIS — Z992 Dependence on renal dialysis: Secondary | ICD-10-CM | POA: Diagnosis not present

## 2021-06-01 DIAGNOSIS — N186 End stage renal disease: Secondary | ICD-10-CM | POA: Diagnosis not present

## 2021-06-02 DIAGNOSIS — N186 End stage renal disease: Secondary | ICD-10-CM | POA: Diagnosis not present

## 2021-06-02 DIAGNOSIS — Z992 Dependence on renal dialysis: Secondary | ICD-10-CM | POA: Diagnosis not present

## 2021-06-03 DIAGNOSIS — Z992 Dependence on renal dialysis: Secondary | ICD-10-CM | POA: Diagnosis not present

## 2021-06-03 DIAGNOSIS — N186 End stage renal disease: Secondary | ICD-10-CM | POA: Diagnosis not present

## 2021-06-04 DIAGNOSIS — Z992 Dependence on renal dialysis: Secondary | ICD-10-CM | POA: Diagnosis not present

## 2021-06-04 DIAGNOSIS — N186 End stage renal disease: Secondary | ICD-10-CM | POA: Diagnosis not present

## 2021-06-05 DIAGNOSIS — Z992 Dependence on renal dialysis: Secondary | ICD-10-CM | POA: Diagnosis not present

## 2021-06-05 DIAGNOSIS — N186 End stage renal disease: Secondary | ICD-10-CM | POA: Diagnosis not present

## 2021-06-06 DIAGNOSIS — Z992 Dependence on renal dialysis: Secondary | ICD-10-CM | POA: Diagnosis not present

## 2021-06-06 DIAGNOSIS — N186 End stage renal disease: Secondary | ICD-10-CM | POA: Diagnosis not present

## 2021-06-07 DIAGNOSIS — Z992 Dependence on renal dialysis: Secondary | ICD-10-CM | POA: Diagnosis not present

## 2021-06-07 DIAGNOSIS — N186 End stage renal disease: Secondary | ICD-10-CM | POA: Diagnosis not present

## 2021-06-08 DIAGNOSIS — N186 End stage renal disease: Secondary | ICD-10-CM | POA: Diagnosis not present

## 2021-06-08 DIAGNOSIS — Z992 Dependence on renal dialysis: Secondary | ICD-10-CM | POA: Diagnosis not present

## 2021-06-09 DIAGNOSIS — N186 End stage renal disease: Secondary | ICD-10-CM | POA: Diagnosis not present

## 2021-06-09 DIAGNOSIS — Z992 Dependence on renal dialysis: Secondary | ICD-10-CM | POA: Diagnosis not present

## 2021-06-10 DIAGNOSIS — N186 End stage renal disease: Secondary | ICD-10-CM | POA: Diagnosis not present

## 2021-06-10 DIAGNOSIS — Z992 Dependence on renal dialysis: Secondary | ICD-10-CM | POA: Diagnosis not present

## 2021-06-11 DIAGNOSIS — N186 End stage renal disease: Secondary | ICD-10-CM | POA: Diagnosis not present

## 2021-06-11 DIAGNOSIS — Z992 Dependence on renal dialysis: Secondary | ICD-10-CM | POA: Diagnosis not present

## 2021-06-12 DIAGNOSIS — Z992 Dependence on renal dialysis: Secondary | ICD-10-CM | POA: Diagnosis not present

## 2021-06-12 DIAGNOSIS — N186 End stage renal disease: Secondary | ICD-10-CM | POA: Diagnosis not present

## 2021-06-13 DIAGNOSIS — Z992 Dependence on renal dialysis: Secondary | ICD-10-CM | POA: Diagnosis not present

## 2021-06-13 DIAGNOSIS — N186 End stage renal disease: Secondary | ICD-10-CM | POA: Diagnosis not present

## 2021-06-14 DIAGNOSIS — N186 End stage renal disease: Secondary | ICD-10-CM | POA: Diagnosis not present

## 2021-06-14 DIAGNOSIS — Z992 Dependence on renal dialysis: Secondary | ICD-10-CM | POA: Diagnosis not present

## 2021-06-15 DIAGNOSIS — Z992 Dependence on renal dialysis: Secondary | ICD-10-CM | POA: Diagnosis not present

## 2021-06-15 DIAGNOSIS — N186 End stage renal disease: Secondary | ICD-10-CM | POA: Diagnosis not present

## 2021-06-16 DIAGNOSIS — N186 End stage renal disease: Secondary | ICD-10-CM | POA: Diagnosis not present

## 2021-06-16 DIAGNOSIS — Z992 Dependence on renal dialysis: Secondary | ICD-10-CM | POA: Diagnosis not present

## 2021-06-17 DIAGNOSIS — N186 End stage renal disease: Secondary | ICD-10-CM | POA: Diagnosis not present

## 2021-06-17 DIAGNOSIS — Z992 Dependence on renal dialysis: Secondary | ICD-10-CM | POA: Diagnosis not present

## 2021-06-18 DIAGNOSIS — Z992 Dependence on renal dialysis: Secondary | ICD-10-CM | POA: Diagnosis not present

## 2021-06-18 DIAGNOSIS — N186 End stage renal disease: Secondary | ICD-10-CM | POA: Diagnosis not present

## 2021-06-19 DIAGNOSIS — Z992 Dependence on renal dialysis: Secondary | ICD-10-CM | POA: Diagnosis not present

## 2021-06-19 DIAGNOSIS — N186 End stage renal disease: Secondary | ICD-10-CM | POA: Diagnosis not present

## 2021-06-20 DIAGNOSIS — N186 End stage renal disease: Secondary | ICD-10-CM | POA: Diagnosis not present

## 2021-06-20 DIAGNOSIS — Z992 Dependence on renal dialysis: Secondary | ICD-10-CM | POA: Diagnosis not present

## 2021-06-21 DIAGNOSIS — Z992 Dependence on renal dialysis: Secondary | ICD-10-CM | POA: Diagnosis not present

## 2021-06-21 DIAGNOSIS — N186 End stage renal disease: Secondary | ICD-10-CM | POA: Diagnosis not present

## 2021-06-22 DIAGNOSIS — N186 End stage renal disease: Secondary | ICD-10-CM | POA: Diagnosis not present

## 2021-06-22 DIAGNOSIS — Z992 Dependence on renal dialysis: Secondary | ICD-10-CM | POA: Diagnosis not present

## 2021-06-23 DIAGNOSIS — Z992 Dependence on renal dialysis: Secondary | ICD-10-CM | POA: Diagnosis not present

## 2021-06-23 DIAGNOSIS — N186 End stage renal disease: Secondary | ICD-10-CM | POA: Diagnosis not present

## 2021-06-24 DIAGNOSIS — Z992 Dependence on renal dialysis: Secondary | ICD-10-CM | POA: Diagnosis not present

## 2021-06-24 DIAGNOSIS — N186 End stage renal disease: Secondary | ICD-10-CM | POA: Diagnosis not present

## 2021-06-25 DIAGNOSIS — N186 End stage renal disease: Secondary | ICD-10-CM | POA: Diagnosis not present

## 2021-06-25 DIAGNOSIS — Z992 Dependence on renal dialysis: Secondary | ICD-10-CM | POA: Diagnosis not present

## 2021-06-26 DIAGNOSIS — N186 End stage renal disease: Secondary | ICD-10-CM | POA: Diagnosis not present

## 2021-06-26 DIAGNOSIS — Z992 Dependence on renal dialysis: Secondary | ICD-10-CM | POA: Diagnosis not present

## 2021-06-27 DIAGNOSIS — Z992 Dependence on renal dialysis: Secondary | ICD-10-CM | POA: Diagnosis not present

## 2021-06-27 DIAGNOSIS — N186 End stage renal disease: Secondary | ICD-10-CM | POA: Diagnosis not present

## 2021-06-28 DIAGNOSIS — N186 End stage renal disease: Secondary | ICD-10-CM | POA: Diagnosis not present

## 2021-06-28 DIAGNOSIS — Z992 Dependence on renal dialysis: Secondary | ICD-10-CM | POA: Diagnosis not present

## 2021-06-29 DIAGNOSIS — Z992 Dependence on renal dialysis: Secondary | ICD-10-CM | POA: Diagnosis not present

## 2021-06-29 DIAGNOSIS — N186 End stage renal disease: Secondary | ICD-10-CM | POA: Diagnosis not present

## 2021-06-30 DIAGNOSIS — N186 End stage renal disease: Secondary | ICD-10-CM | POA: Diagnosis not present

## 2021-06-30 DIAGNOSIS — Z992 Dependence on renal dialysis: Secondary | ICD-10-CM | POA: Diagnosis not present

## 2021-07-01 DIAGNOSIS — Z992 Dependence on renal dialysis: Secondary | ICD-10-CM | POA: Diagnosis not present

## 2021-07-01 DIAGNOSIS — N186 End stage renal disease: Secondary | ICD-10-CM | POA: Diagnosis not present

## 2021-07-02 DIAGNOSIS — D509 Iron deficiency anemia, unspecified: Secondary | ICD-10-CM | POA: Diagnosis not present

## 2021-07-02 DIAGNOSIS — Z992 Dependence on renal dialysis: Secondary | ICD-10-CM | POA: Diagnosis not present

## 2021-07-02 DIAGNOSIS — N186 End stage renal disease: Secondary | ICD-10-CM | POA: Diagnosis not present

## 2021-07-03 DIAGNOSIS — N186 End stage renal disease: Secondary | ICD-10-CM | POA: Diagnosis not present

## 2021-07-03 DIAGNOSIS — Z992 Dependence on renal dialysis: Secondary | ICD-10-CM | POA: Diagnosis not present

## 2021-07-04 DIAGNOSIS — Z992 Dependence on renal dialysis: Secondary | ICD-10-CM | POA: Diagnosis not present

## 2021-07-04 DIAGNOSIS — N186 End stage renal disease: Secondary | ICD-10-CM | POA: Diagnosis not present

## 2021-07-05 DIAGNOSIS — N186 End stage renal disease: Secondary | ICD-10-CM | POA: Diagnosis not present

## 2021-07-05 DIAGNOSIS — Z992 Dependence on renal dialysis: Secondary | ICD-10-CM | POA: Diagnosis not present

## 2021-07-06 DIAGNOSIS — N186 End stage renal disease: Secondary | ICD-10-CM | POA: Diagnosis not present

## 2021-07-06 DIAGNOSIS — Z992 Dependence on renal dialysis: Secondary | ICD-10-CM | POA: Diagnosis not present

## 2021-07-07 DIAGNOSIS — N186 End stage renal disease: Secondary | ICD-10-CM | POA: Diagnosis not present

## 2021-07-07 DIAGNOSIS — Z992 Dependence on renal dialysis: Secondary | ICD-10-CM | POA: Diagnosis not present

## 2021-07-08 DIAGNOSIS — Z992 Dependence on renal dialysis: Secondary | ICD-10-CM | POA: Diagnosis not present

## 2021-07-08 DIAGNOSIS — N186 End stage renal disease: Secondary | ICD-10-CM | POA: Diagnosis not present

## 2021-07-09 DIAGNOSIS — Z992 Dependence on renal dialysis: Secondary | ICD-10-CM | POA: Diagnosis not present

## 2021-07-09 DIAGNOSIS — N186 End stage renal disease: Secondary | ICD-10-CM | POA: Diagnosis not present

## 2021-07-10 DIAGNOSIS — Z992 Dependence on renal dialysis: Secondary | ICD-10-CM | POA: Diagnosis not present

## 2021-07-10 DIAGNOSIS — N186 End stage renal disease: Secondary | ICD-10-CM | POA: Diagnosis not present

## 2021-07-11 DIAGNOSIS — N186 End stage renal disease: Secondary | ICD-10-CM | POA: Diagnosis not present

## 2021-07-11 DIAGNOSIS — Z992 Dependence on renal dialysis: Secondary | ICD-10-CM | POA: Diagnosis not present

## 2021-07-12 DIAGNOSIS — N186 End stage renal disease: Secondary | ICD-10-CM | POA: Diagnosis not present

## 2021-07-12 DIAGNOSIS — Z992 Dependence on renal dialysis: Secondary | ICD-10-CM | POA: Diagnosis not present

## 2021-07-13 DIAGNOSIS — Z992 Dependence on renal dialysis: Secondary | ICD-10-CM | POA: Diagnosis not present

## 2021-07-13 DIAGNOSIS — N186 End stage renal disease: Secondary | ICD-10-CM | POA: Diagnosis not present

## 2021-07-14 DIAGNOSIS — N186 End stage renal disease: Secondary | ICD-10-CM | POA: Diagnosis not present

## 2021-07-14 DIAGNOSIS — Z992 Dependence on renal dialysis: Secondary | ICD-10-CM | POA: Diagnosis not present

## 2021-07-15 DIAGNOSIS — Z992 Dependence on renal dialysis: Secondary | ICD-10-CM | POA: Diagnosis not present

## 2021-07-15 DIAGNOSIS — N186 End stage renal disease: Secondary | ICD-10-CM | POA: Diagnosis not present

## 2021-07-16 DIAGNOSIS — N186 End stage renal disease: Secondary | ICD-10-CM | POA: Diagnosis not present

## 2021-07-16 DIAGNOSIS — Z992 Dependence on renal dialysis: Secondary | ICD-10-CM | POA: Diagnosis not present

## 2021-07-17 DIAGNOSIS — Z992 Dependence on renal dialysis: Secondary | ICD-10-CM | POA: Diagnosis not present

## 2021-07-17 DIAGNOSIS — N186 End stage renal disease: Secondary | ICD-10-CM | POA: Diagnosis not present

## 2021-07-18 DIAGNOSIS — N186 End stage renal disease: Secondary | ICD-10-CM | POA: Diagnosis not present

## 2021-07-18 DIAGNOSIS — Z992 Dependence on renal dialysis: Secondary | ICD-10-CM | POA: Diagnosis not present

## 2021-07-19 DIAGNOSIS — N186 End stage renal disease: Secondary | ICD-10-CM | POA: Diagnosis not present

## 2021-07-19 DIAGNOSIS — Z992 Dependence on renal dialysis: Secondary | ICD-10-CM | POA: Diagnosis not present

## 2021-07-20 DIAGNOSIS — Z992 Dependence on renal dialysis: Secondary | ICD-10-CM | POA: Diagnosis not present

## 2021-07-20 DIAGNOSIS — N186 End stage renal disease: Secondary | ICD-10-CM | POA: Diagnosis not present

## 2021-07-21 DIAGNOSIS — N186 End stage renal disease: Secondary | ICD-10-CM | POA: Diagnosis not present

## 2021-07-21 DIAGNOSIS — Z992 Dependence on renal dialysis: Secondary | ICD-10-CM | POA: Diagnosis not present

## 2021-07-22 DIAGNOSIS — N186 End stage renal disease: Secondary | ICD-10-CM | POA: Diagnosis not present

## 2021-07-22 DIAGNOSIS — Z992 Dependence on renal dialysis: Secondary | ICD-10-CM | POA: Diagnosis not present

## 2021-07-23 DIAGNOSIS — Z992 Dependence on renal dialysis: Secondary | ICD-10-CM | POA: Diagnosis not present

## 2021-07-23 DIAGNOSIS — N186 End stage renal disease: Secondary | ICD-10-CM | POA: Diagnosis not present

## 2021-07-24 DIAGNOSIS — N186 End stage renal disease: Secondary | ICD-10-CM | POA: Diagnosis not present

## 2021-07-24 DIAGNOSIS — Z992 Dependence on renal dialysis: Secondary | ICD-10-CM | POA: Diagnosis not present

## 2021-07-25 DIAGNOSIS — Z992 Dependence on renal dialysis: Secondary | ICD-10-CM | POA: Diagnosis not present

## 2021-07-25 DIAGNOSIS — N186 End stage renal disease: Secondary | ICD-10-CM | POA: Diagnosis not present

## 2021-07-26 DIAGNOSIS — Z992 Dependence on renal dialysis: Secondary | ICD-10-CM | POA: Diagnosis not present

## 2021-07-26 DIAGNOSIS — N186 End stage renal disease: Secondary | ICD-10-CM | POA: Diagnosis not present

## 2021-07-27 DIAGNOSIS — N186 End stage renal disease: Secondary | ICD-10-CM | POA: Diagnosis not present

## 2021-07-27 DIAGNOSIS — Z992 Dependence on renal dialysis: Secondary | ICD-10-CM | POA: Diagnosis not present

## 2021-07-28 DIAGNOSIS — Z992 Dependence on renal dialysis: Secondary | ICD-10-CM | POA: Diagnosis not present

## 2021-07-28 DIAGNOSIS — N186 End stage renal disease: Secondary | ICD-10-CM | POA: Diagnosis not present

## 2021-07-29 DIAGNOSIS — N186 End stage renal disease: Secondary | ICD-10-CM | POA: Diagnosis not present

## 2021-07-29 DIAGNOSIS — Z992 Dependence on renal dialysis: Secondary | ICD-10-CM | POA: Diagnosis not present

## 2021-07-30 DIAGNOSIS — N186 End stage renal disease: Secondary | ICD-10-CM | POA: Diagnosis not present

## 2021-07-30 DIAGNOSIS — Z992 Dependence on renal dialysis: Secondary | ICD-10-CM | POA: Diagnosis not present

## 2021-07-31 DIAGNOSIS — Z992 Dependence on renal dialysis: Secondary | ICD-10-CM | POA: Diagnosis not present

## 2021-07-31 DIAGNOSIS — N186 End stage renal disease: Secondary | ICD-10-CM | POA: Diagnosis not present

## 2021-08-01 DIAGNOSIS — Z992 Dependence on renal dialysis: Secondary | ICD-10-CM | POA: Diagnosis not present

## 2021-08-01 DIAGNOSIS — N186 End stage renal disease: Secondary | ICD-10-CM | POA: Diagnosis not present

## 2021-08-02 DIAGNOSIS — N186 End stage renal disease: Secondary | ICD-10-CM | POA: Diagnosis not present

## 2021-08-02 DIAGNOSIS — Z992 Dependence on renal dialysis: Secondary | ICD-10-CM | POA: Diagnosis not present

## 2021-08-03 DIAGNOSIS — N186 End stage renal disease: Secondary | ICD-10-CM | POA: Diagnosis not present

## 2021-08-03 DIAGNOSIS — Z992 Dependence on renal dialysis: Secondary | ICD-10-CM | POA: Diagnosis not present

## 2021-08-04 DIAGNOSIS — N186 End stage renal disease: Secondary | ICD-10-CM | POA: Diagnosis not present

## 2021-08-04 DIAGNOSIS — Z992 Dependence on renal dialysis: Secondary | ICD-10-CM | POA: Diagnosis not present

## 2021-08-05 DIAGNOSIS — Z992 Dependence on renal dialysis: Secondary | ICD-10-CM | POA: Diagnosis not present

## 2021-08-05 DIAGNOSIS — N186 End stage renal disease: Secondary | ICD-10-CM | POA: Diagnosis not present

## 2021-08-06 DIAGNOSIS — Z992 Dependence on renal dialysis: Secondary | ICD-10-CM | POA: Diagnosis not present

## 2021-08-06 DIAGNOSIS — N186 End stage renal disease: Secondary | ICD-10-CM | POA: Diagnosis not present

## 2021-08-07 DIAGNOSIS — Z992 Dependence on renal dialysis: Secondary | ICD-10-CM | POA: Diagnosis not present

## 2021-08-07 DIAGNOSIS — N186 End stage renal disease: Secondary | ICD-10-CM | POA: Diagnosis not present

## 2021-08-08 DIAGNOSIS — N186 End stage renal disease: Secondary | ICD-10-CM | POA: Diagnosis not present

## 2021-08-08 DIAGNOSIS — Z992 Dependence on renal dialysis: Secondary | ICD-10-CM | POA: Diagnosis not present

## 2021-08-09 DIAGNOSIS — N186 End stage renal disease: Secondary | ICD-10-CM | POA: Diagnosis not present

## 2021-08-09 DIAGNOSIS — Z992 Dependence on renal dialysis: Secondary | ICD-10-CM | POA: Diagnosis not present

## 2021-08-10 DIAGNOSIS — Z992 Dependence on renal dialysis: Secondary | ICD-10-CM | POA: Diagnosis not present

## 2021-08-10 DIAGNOSIS — N186 End stage renal disease: Secondary | ICD-10-CM | POA: Diagnosis not present

## 2021-08-11 DIAGNOSIS — N186 End stage renal disease: Secondary | ICD-10-CM | POA: Diagnosis not present

## 2021-08-11 DIAGNOSIS — Z992 Dependence on renal dialysis: Secondary | ICD-10-CM | POA: Diagnosis not present

## 2021-08-12 DIAGNOSIS — Z992 Dependence on renal dialysis: Secondary | ICD-10-CM | POA: Diagnosis not present

## 2021-08-12 DIAGNOSIS — N186 End stage renal disease: Secondary | ICD-10-CM | POA: Diagnosis not present

## 2021-08-13 DIAGNOSIS — N186 End stage renal disease: Secondary | ICD-10-CM | POA: Diagnosis not present

## 2021-08-13 DIAGNOSIS — Z992 Dependence on renal dialysis: Secondary | ICD-10-CM | POA: Diagnosis not present

## 2021-08-14 DIAGNOSIS — N186 End stage renal disease: Secondary | ICD-10-CM | POA: Diagnosis not present

## 2021-08-14 DIAGNOSIS — Z992 Dependence on renal dialysis: Secondary | ICD-10-CM | POA: Diagnosis not present

## 2021-08-15 DIAGNOSIS — N186 End stage renal disease: Secondary | ICD-10-CM | POA: Diagnosis not present

## 2021-08-15 DIAGNOSIS — Z992 Dependence on renal dialysis: Secondary | ICD-10-CM | POA: Diagnosis not present

## 2021-08-16 DIAGNOSIS — N186 End stage renal disease: Secondary | ICD-10-CM | POA: Diagnosis not present

## 2021-08-16 DIAGNOSIS — Z992 Dependence on renal dialysis: Secondary | ICD-10-CM | POA: Diagnosis not present

## 2021-08-17 DIAGNOSIS — Z992 Dependence on renal dialysis: Secondary | ICD-10-CM | POA: Diagnosis not present

## 2021-08-17 DIAGNOSIS — N186 End stage renal disease: Secondary | ICD-10-CM | POA: Diagnosis not present

## 2021-08-18 DIAGNOSIS — N186 End stage renal disease: Secondary | ICD-10-CM | POA: Diagnosis not present

## 2021-08-18 DIAGNOSIS — Z992 Dependence on renal dialysis: Secondary | ICD-10-CM | POA: Diagnosis not present

## 2021-08-19 DIAGNOSIS — Z992 Dependence on renal dialysis: Secondary | ICD-10-CM | POA: Diagnosis not present

## 2021-08-19 DIAGNOSIS — N186 End stage renal disease: Secondary | ICD-10-CM | POA: Diagnosis not present

## 2021-08-20 DIAGNOSIS — N186 End stage renal disease: Secondary | ICD-10-CM | POA: Diagnosis not present

## 2021-08-20 DIAGNOSIS — Z992 Dependence on renal dialysis: Secondary | ICD-10-CM | POA: Diagnosis not present

## 2021-08-21 DIAGNOSIS — N186 End stage renal disease: Secondary | ICD-10-CM | POA: Diagnosis not present

## 2021-08-21 DIAGNOSIS — Z992 Dependence on renal dialysis: Secondary | ICD-10-CM | POA: Diagnosis not present

## 2021-08-22 DIAGNOSIS — Z992 Dependence on renal dialysis: Secondary | ICD-10-CM | POA: Diagnosis not present

## 2021-08-22 DIAGNOSIS — N186 End stage renal disease: Secondary | ICD-10-CM | POA: Diagnosis not present

## 2021-08-23 DIAGNOSIS — Z992 Dependence on renal dialysis: Secondary | ICD-10-CM | POA: Diagnosis not present

## 2021-08-23 DIAGNOSIS — N186 End stage renal disease: Secondary | ICD-10-CM | POA: Diagnosis not present

## 2021-08-24 DIAGNOSIS — N186 End stage renal disease: Secondary | ICD-10-CM | POA: Diagnosis not present

## 2021-08-24 DIAGNOSIS — Z992 Dependence on renal dialysis: Secondary | ICD-10-CM | POA: Diagnosis not present

## 2021-08-25 DIAGNOSIS — N186 End stage renal disease: Secondary | ICD-10-CM | POA: Diagnosis not present

## 2021-08-25 DIAGNOSIS — Z992 Dependence on renal dialysis: Secondary | ICD-10-CM | POA: Diagnosis not present

## 2021-08-26 DIAGNOSIS — Z992 Dependence on renal dialysis: Secondary | ICD-10-CM | POA: Diagnosis not present

## 2021-08-26 DIAGNOSIS — N186 End stage renal disease: Secondary | ICD-10-CM | POA: Diagnosis not present

## 2021-08-27 DIAGNOSIS — Z992 Dependence on renal dialysis: Secondary | ICD-10-CM | POA: Diagnosis not present

## 2021-08-27 DIAGNOSIS — N186 End stage renal disease: Secondary | ICD-10-CM | POA: Diagnosis not present

## 2021-08-28 DIAGNOSIS — Z992 Dependence on renal dialysis: Secondary | ICD-10-CM | POA: Diagnosis not present

## 2021-08-28 DIAGNOSIS — N186 End stage renal disease: Secondary | ICD-10-CM | POA: Diagnosis not present

## 2021-08-29 DIAGNOSIS — Z992 Dependence on renal dialysis: Secondary | ICD-10-CM | POA: Diagnosis not present

## 2021-08-29 DIAGNOSIS — N186 End stage renal disease: Secondary | ICD-10-CM | POA: Diagnosis not present

## 2021-08-30 DIAGNOSIS — N186 End stage renal disease: Secondary | ICD-10-CM | POA: Diagnosis not present

## 2021-08-30 DIAGNOSIS — Z992 Dependence on renal dialysis: Secondary | ICD-10-CM | POA: Diagnosis not present

## 2021-08-31 DIAGNOSIS — N186 End stage renal disease: Secondary | ICD-10-CM | POA: Diagnosis not present

## 2021-08-31 DIAGNOSIS — Z992 Dependence on renal dialysis: Secondary | ICD-10-CM | POA: Diagnosis not present

## 2021-09-01 DIAGNOSIS — Z992 Dependence on renal dialysis: Secondary | ICD-10-CM | POA: Diagnosis not present

## 2021-09-01 DIAGNOSIS — N186 End stage renal disease: Secondary | ICD-10-CM | POA: Diagnosis not present

## 2021-09-02 DIAGNOSIS — Z992 Dependence on renal dialysis: Secondary | ICD-10-CM | POA: Diagnosis not present

## 2021-09-02 DIAGNOSIS — N186 End stage renal disease: Secondary | ICD-10-CM | POA: Diagnosis not present

## 2021-09-03 DIAGNOSIS — N186 End stage renal disease: Secondary | ICD-10-CM | POA: Diagnosis not present

## 2021-09-03 DIAGNOSIS — Z992 Dependence on renal dialysis: Secondary | ICD-10-CM | POA: Diagnosis not present

## 2021-09-04 DIAGNOSIS — N186 End stage renal disease: Secondary | ICD-10-CM | POA: Diagnosis not present

## 2021-09-04 DIAGNOSIS — Z992 Dependence on renal dialysis: Secondary | ICD-10-CM | POA: Diagnosis not present

## 2021-09-05 DIAGNOSIS — Z992 Dependence on renal dialysis: Secondary | ICD-10-CM | POA: Diagnosis not present

## 2021-09-05 DIAGNOSIS — N186 End stage renal disease: Secondary | ICD-10-CM | POA: Diagnosis not present

## 2021-09-06 DIAGNOSIS — Z992 Dependence on renal dialysis: Secondary | ICD-10-CM | POA: Diagnosis not present

## 2021-09-06 DIAGNOSIS — N186 End stage renal disease: Secondary | ICD-10-CM | POA: Diagnosis not present

## 2021-09-07 DIAGNOSIS — Z992 Dependence on renal dialysis: Secondary | ICD-10-CM | POA: Diagnosis not present

## 2021-09-07 DIAGNOSIS — N186 End stage renal disease: Secondary | ICD-10-CM | POA: Diagnosis not present

## 2021-09-08 DIAGNOSIS — N186 End stage renal disease: Secondary | ICD-10-CM | POA: Diagnosis not present

## 2021-09-08 DIAGNOSIS — Z992 Dependence on renal dialysis: Secondary | ICD-10-CM | POA: Diagnosis not present

## 2021-09-09 ENCOUNTER — Emergency Department (HOSPITAL_BASED_OUTPATIENT_CLINIC_OR_DEPARTMENT_OTHER)
Admission: EM | Admit: 2021-09-09 | Discharge: 2021-09-09 | Disposition: A | Discharge status: Home / Self Care | Payer: Medicare HMO | Attending: Emergency Medicine | Admitting: Emergency Medicine

## 2021-09-09 ENCOUNTER — Other Ambulatory Visit: Payer: Self-pay

## 2021-09-09 ENCOUNTER — Encounter (HOSPITAL_BASED_OUTPATIENT_CLINIC_OR_DEPARTMENT_OTHER): Payer: Self-pay

## 2021-09-09 DIAGNOSIS — M545 Low back pain, unspecified: Secondary | ICD-10-CM | POA: Diagnosis not present

## 2021-09-09 DIAGNOSIS — Z992 Dependence on renal dialysis: Secondary | ICD-10-CM | POA: Diagnosis not present

## 2021-09-09 DIAGNOSIS — Z79899 Other long term (current) drug therapy: Secondary | ICD-10-CM | POA: Insufficient documentation

## 2021-09-09 DIAGNOSIS — N186 End stage renal disease: Secondary | ICD-10-CM | POA: Diagnosis not present

## 2021-09-09 DIAGNOSIS — M79661 Pain in right lower leg: Secondary | ICD-10-CM | POA: Insufficient documentation

## 2021-09-09 DIAGNOSIS — M79604 Pain in right leg: Secondary | ICD-10-CM | POA: Diagnosis not present

## 2021-09-09 MED ORDER — NAPROXEN 500 MG PO TABS: 500.0000 mg | ORAL_TABLET | Freq: Two times a day (BID) | ORAL | 0 refills | Status: DC

## 2021-09-09 MED ORDER — LIDOCAINE 5 % EX PTCH: 1.0000 | MEDICATED_PATCH | CUTANEOUS | 0 refills | Status: DC

## 2021-09-09 MED ORDER — CYCLOBENZAPRINE HCL 5 MG PO TABS
5.0000 mg | ORAL_TABLET | Freq: Once | ORAL | Status: AC
  Administered 2021-09-09: 5 mg via ORAL
  Filled 2021-09-09: qty 1

## 2021-09-09 MED ORDER — NAPROXEN 250 MG PO TABS
500.0000 mg | ORAL_TABLET | Freq: Once | ORAL | Status: AC
  Administered 2021-09-09: 500 mg via ORAL
  Filled 2021-09-09: qty 2

## 2021-09-09 MED ORDER — CYCLOBENZAPRINE HCL 10 MG PO TABS: 10.0000 mg | ORAL_TABLET | Freq: Two times a day (BID) | ORAL | 0 refills | Status: AC | PRN

## 2021-09-09 NOTE — Discharge Instructions (Addendum)
Please follow-up with the sports medicine provider attached to these discharge papers.  Take the muscle relaxants and naproxen as prescribed.  You may also use the lidocaine patches, remember no more than 12 hours at the same time.  Return with any worsening symptoms

## 2021-09-09 NOTE — ED Triage Notes (Signed)
Pt c/o pain to right leg x 1 month-c/o pain to lower back and bilat leg x 2-3 days-denies injury-NAD-slow gait

## 2021-09-09 NOTE — ED Notes (Signed)
Bilat leg pain x 3 days and lower back pain, no injury

## 2021-09-09 NOTE — ED Provider Notes (Signed)
[facility] EMERGENCY DEPARTMENT Provider Note   CSN: [id number] Arrival date & time: 09/09/21  1556     History  Chief Complaint  Patient presents with   Leg Pain    Derrick Moore is a 56 y.o. male presenting with right leg pain for the past month.  Reports it is worsening and now and his right lower back as well.  Says it is becoming more difficult to walk due to his discomfort.  Reports that he works at [employer] and lifts a lot of boxes and walks and is on his feet all day.  Has been using naproxen as well as extra strength NSAID pain medication which helps get him through his shift.  Denies any saddle anesthesia, bowel/bladder dysfunction, fevers or chills.  Has never been evaluated for this.  No inciting injury.  Home Medications Prior to Admission medications   Medication Sig Start Date End Date Taking? Authorizing Provider  amLODipine (NORVASC) 5 MG tablet Take 5 mg by mouth at bedtime. 07/04/20   [provider]  clopidogrel (PLAVIX) 75 MG tablet Take 75 mg by mouth daily. 07/04/20   [provider]  doxycycline (VIBRA-TABS) 100 MG tablet Take 1 tablet (100 mg total) by mouth 2 (two) times daily. 03/08/21   Saguier, Percell Miller, PA-C  hydrALAZINE (APRESOLINE) 50 MG tablet Take 50 mg by mouth 2 (two) times daily. 07/04/20   [provider]  JUBLIA 10 % SOLN Apply topically. 09/06/20   [provider]  labetalol (NORMODYNE) 200 MG tablet Take 200 mg by mouth 2 (two) times daily. 07/04/20   [provider]  LASIX 40 MG tablet Take 40 mg by mouth every morning. 07/04/20   [provider]  lisinopril (ZESTRIL) 30 MG tablet Take 30 mg by mouth at bedtime. 07/04/20   [provider]  pravastatin (PRAVACHOL) 10 MG tablet Take 10 mg by mouth daily. 07/04/20   [provider]  RAYOS 5 MG TBEC Take 1 tablet by mouth daily. 08/29/20   [provider]  sertraline (ZOLOFT) 50 MG tablet Take 1 tablet (50 mg  total) by mouth daily. 03/08/21   Saguier, Percell Miller, PA-C  sildenafil (VIAGRA) 50 MG tablet Take 1 tablet (50 mg total) by mouth daily as needed for erectile dysfunction. 09/11/20   Saguier, Percell Miller, PA-C      Allergies    Patient has no known allergies.    Review of Systems   Review of Systems See HPI Physical Exam Updated Vital Signs BP (!) 151/101 (BP Location: Left Arm)    Pulse 93    Temp 98.2 F (36.8 C) (Oral)    Resp 18    Ht 5\' 10"  (1.778 m)    Wt 68 kg    SpO2 100%    BMI 21.52 kg/m  Physical Exam Vitals and nursing note reviewed.  Constitutional:      Appearance: Normal appearance.  HENT:     Head: Normocephalic and atraumatic.  Eyes:     General: No scleral icterus.    Conjunctiva/sclera: Conjunctivae normal.  Pulmonary:     Effort: Pulmonary effort is normal. No respiratory distress.  Musculoskeletal:        General: Normal range of motion.     Comments: Patient with full range of motion of all levels of the spine.  No reproducible tenderness in paraspinal muscles or midline.  Right-sided pain is exacerbated with twisting to the left.    Skin:    Findings: No rash.  Neurological:     Mental Status: He is alert.     Gait: Gait abnormal (Slightly limping with difficulty lifting his right lower extremity off the ground due to a sharp pain he reports in his right lower extremity).  Psychiatric:        Mood and Affect: Mood normal.    ED Results / Procedures / Treatments   Labs (all labs ordered are listed, but only abnormal results are displayed) Labs Reviewed - No data to display  EKG None  Radiology No results found.  Procedures Procedures   Medications Ordered in ED Medications - No data to display  ED Course/ Medical Decision Making/ A&P                           Medical Decision Making Risk Prescription drug management.   56 year old male presenting with right leg pain that has been going on for over a month.  Reports that his pain is getting  worse and it is difficult for him to lift boxes and stand throughout his workday shift.  No saddle anesthesia, bowel or bladder dysfunction, fever, chills or falls.  Physical exam largely benign.  Full range of motion of all joints of the right lower extremity.  No midline tenderness.  No reproducible tenderness of the lumbar spine on palpation.  Patient was with difficulty ambulating secondary to pain.  Pain in the right side of his back increased with left-sided twisting.  At this time, imaging is not indicated.  We will trial muscle relaxants along with his naproxen and lidocaine patches that may help him during the workday.  He will be given a referral to sports medicine for any follow-up treatment.  He is agreeable to this plan, discharged in good condition.  Final Clinical Impression(s) / ED Diagnoses Final diagnoses:  Right leg pain    Rx / DC Orders ED Discharge Orders          Ordered    cyclobenzaprine (FLEXERIL) 10 MG tablet  2 times daily PRN        09/09/21 1824    naproxen (NAPROSYN) 500 MG tablet  2 times daily        09/09/21 1824    lidocaine (LIDODERM) 5 %  Every 24 hours        09/09/21 1824           Results and diagnoses were explained to the patient. Return precautions discussed in full. Patient had no additional questions and expressed complete understanding.   This chart was dictated using voice recognition software.  Despite best efforts to proofread,  errors can occur which can change the documentation meaning.    Rhae Hammock, PA-C 09/09/21 [place] A, DO 09/10/21 1858

## 2021-09-10 DIAGNOSIS — Z992 Dependence on renal dialysis: Secondary | ICD-10-CM | POA: Diagnosis not present

## 2021-09-10 DIAGNOSIS — N186 End stage renal disease: Secondary | ICD-10-CM | POA: Diagnosis not present

## 2021-09-11 DIAGNOSIS — Z992 Dependence on renal dialysis: Secondary | ICD-10-CM | POA: Diagnosis not present

## 2021-09-11 DIAGNOSIS — N186 End stage renal disease: Secondary | ICD-10-CM | POA: Diagnosis not present

## 2021-09-12 DIAGNOSIS — Z992 Dependence on renal dialysis: Secondary | ICD-10-CM | POA: Diagnosis not present

## 2021-09-12 DIAGNOSIS — N186 End stage renal disease: Secondary | ICD-10-CM | POA: Diagnosis not present

## 2021-09-13 DIAGNOSIS — N186 End stage renal disease: Secondary | ICD-10-CM | POA: Diagnosis not present

## 2021-09-13 DIAGNOSIS — Z992 Dependence on renal dialysis: Secondary | ICD-10-CM | POA: Diagnosis not present

## 2021-09-14 DIAGNOSIS — Z992 Dependence on renal dialysis: Secondary | ICD-10-CM | POA: Diagnosis not present

## 2021-09-14 DIAGNOSIS — N186 End stage renal disease: Secondary | ICD-10-CM | POA: Diagnosis not present

## 2021-09-15 DIAGNOSIS — Z992 Dependence on renal dialysis: Secondary | ICD-10-CM | POA: Diagnosis not present

## 2021-09-15 DIAGNOSIS — N186 End stage renal disease: Secondary | ICD-10-CM | POA: Diagnosis not present

## 2021-09-16 DIAGNOSIS — Z992 Dependence on renal dialysis: Secondary | ICD-10-CM | POA: Diagnosis not present

## 2021-09-16 DIAGNOSIS — N186 End stage renal disease: Secondary | ICD-10-CM | POA: Diagnosis not present

## 2021-09-17 DIAGNOSIS — Z992 Dependence on renal dialysis: Secondary | ICD-10-CM | POA: Diagnosis not present

## 2021-09-17 DIAGNOSIS — N186 End stage renal disease: Secondary | ICD-10-CM | POA: Diagnosis not present

## 2021-09-18 DIAGNOSIS — Z992 Dependence on renal dialysis: Secondary | ICD-10-CM | POA: Diagnosis not present

## 2021-09-18 DIAGNOSIS — N186 End stage renal disease: Secondary | ICD-10-CM | POA: Diagnosis not present

## 2021-09-19 DIAGNOSIS — Z992 Dependence on renal dialysis: Secondary | ICD-10-CM | POA: Diagnosis not present

## 2021-09-19 DIAGNOSIS — N186 End stage renal disease: Secondary | ICD-10-CM | POA: Diagnosis not present

## 2021-09-20 DIAGNOSIS — N186 End stage renal disease: Secondary | ICD-10-CM | POA: Diagnosis not present

## 2021-09-20 DIAGNOSIS — Z992 Dependence on renal dialysis: Secondary | ICD-10-CM | POA: Diagnosis not present

## 2021-09-21 DIAGNOSIS — N186 End stage renal disease: Secondary | ICD-10-CM | POA: Diagnosis not present

## 2021-09-21 DIAGNOSIS — Z992 Dependence on renal dialysis: Secondary | ICD-10-CM | POA: Diagnosis not present

## 2021-09-22 DIAGNOSIS — N186 End stage renal disease: Secondary | ICD-10-CM | POA: Diagnosis not present

## 2021-09-22 DIAGNOSIS — Z992 Dependence on renal dialysis: Secondary | ICD-10-CM | POA: Diagnosis not present

## 2021-09-23 DIAGNOSIS — Z992 Dependence on renal dialysis: Secondary | ICD-10-CM | POA: Diagnosis not present

## 2021-09-23 DIAGNOSIS — N186 End stage renal disease: Secondary | ICD-10-CM | POA: Diagnosis not present

## 2021-09-24 DIAGNOSIS — Z992 Dependence on renal dialysis: Secondary | ICD-10-CM | POA: Diagnosis not present

## 2021-09-24 DIAGNOSIS — N186 End stage renal disease: Secondary | ICD-10-CM | POA: Diagnosis not present

## 2021-09-25 DIAGNOSIS — N186 End stage renal disease: Secondary | ICD-10-CM | POA: Diagnosis not present

## 2021-09-25 DIAGNOSIS — Z992 Dependence on renal dialysis: Secondary | ICD-10-CM | POA: Diagnosis not present

## 2021-09-26 DIAGNOSIS — Z992 Dependence on renal dialysis: Secondary | ICD-10-CM | POA: Diagnosis not present

## 2021-09-26 DIAGNOSIS — N186 End stage renal disease: Secondary | ICD-10-CM | POA: Diagnosis not present

## 2021-09-27 DIAGNOSIS — Z992 Dependence on renal dialysis: Secondary | ICD-10-CM | POA: Diagnosis not present

## 2021-09-27 DIAGNOSIS — N186 End stage renal disease: Secondary | ICD-10-CM | POA: Diagnosis not present

## 2021-09-28 DIAGNOSIS — N186 End stage renal disease: Secondary | ICD-10-CM | POA: Diagnosis not present

## 2021-09-28 DIAGNOSIS — Z992 Dependence on renal dialysis: Secondary | ICD-10-CM | POA: Diagnosis not present

## 2021-09-29 DIAGNOSIS — N186 End stage renal disease: Secondary | ICD-10-CM | POA: Diagnosis not present

## 2021-09-29 DIAGNOSIS — Z992 Dependence on renal dialysis: Secondary | ICD-10-CM | POA: Diagnosis not present

## 2021-09-30 DIAGNOSIS — N186 End stage renal disease: Secondary | ICD-10-CM | POA: Diagnosis not present

## 2021-09-30 DIAGNOSIS — Z992 Dependence on renal dialysis: Secondary | ICD-10-CM | POA: Diagnosis not present

## 2021-10-01 DIAGNOSIS — N186 End stage renal disease: Secondary | ICD-10-CM | POA: Diagnosis not present

## 2021-10-01 DIAGNOSIS — Z992 Dependence on renal dialysis: Secondary | ICD-10-CM | POA: Diagnosis not present

## 2021-10-02 DIAGNOSIS — Z992 Dependence on renal dialysis: Secondary | ICD-10-CM | POA: Diagnosis not present

## 2021-10-02 DIAGNOSIS — N186 End stage renal disease: Secondary | ICD-10-CM | POA: Diagnosis not present

## 2021-10-03 DIAGNOSIS — N186 End stage renal disease: Secondary | ICD-10-CM | POA: Diagnosis not present

## 2021-10-03 DIAGNOSIS — Z992 Dependence on renal dialysis: Secondary | ICD-10-CM | POA: Diagnosis not present

## 2021-10-04 DIAGNOSIS — N186 End stage renal disease: Secondary | ICD-10-CM | POA: Diagnosis not present

## 2021-10-04 DIAGNOSIS — Z992 Dependence on renal dialysis: Secondary | ICD-10-CM | POA: Diagnosis not present

## 2021-10-05 DIAGNOSIS — N186 End stage renal disease: Secondary | ICD-10-CM | POA: Diagnosis not present

## 2021-10-05 DIAGNOSIS — Z992 Dependence on renal dialysis: Secondary | ICD-10-CM | POA: Diagnosis not present

## 2021-10-06 DIAGNOSIS — Z992 Dependence on renal dialysis: Secondary | ICD-10-CM | POA: Diagnosis not present

## 2021-10-06 DIAGNOSIS — N186 End stage renal disease: Secondary | ICD-10-CM | POA: Diagnosis not present

## 2021-10-07 DIAGNOSIS — Z992 Dependence on renal dialysis: Secondary | ICD-10-CM | POA: Diagnosis not present

## 2021-10-07 DIAGNOSIS — N186 End stage renal disease: Secondary | ICD-10-CM | POA: Diagnosis not present

## 2021-10-08 DIAGNOSIS — N186 End stage renal disease: Secondary | ICD-10-CM | POA: Diagnosis not present

## 2021-10-08 DIAGNOSIS — Z992 Dependence on renal dialysis: Secondary | ICD-10-CM | POA: Diagnosis not present

## 2021-10-09 DIAGNOSIS — Z992 Dependence on renal dialysis: Secondary | ICD-10-CM | POA: Diagnosis not present

## 2021-10-09 DIAGNOSIS — N186 End stage renal disease: Secondary | ICD-10-CM | POA: Diagnosis not present

## 2021-10-10 DIAGNOSIS — Z992 Dependence on renal dialysis: Secondary | ICD-10-CM | POA: Diagnosis not present

## 2021-10-10 DIAGNOSIS — N186 End stage renal disease: Secondary | ICD-10-CM | POA: Diagnosis not present

## 2021-10-11 DIAGNOSIS — N186 End stage renal disease: Secondary | ICD-10-CM | POA: Diagnosis not present

## 2021-10-11 DIAGNOSIS — Z992 Dependence on renal dialysis: Secondary | ICD-10-CM | POA: Diagnosis not present

## 2021-10-12 DIAGNOSIS — Z992 Dependence on renal dialysis: Secondary | ICD-10-CM | POA: Diagnosis not present

## 2021-10-12 DIAGNOSIS — N186 End stage renal disease: Secondary | ICD-10-CM | POA: Diagnosis not present

## 2021-10-13 DIAGNOSIS — N186 End stage renal disease: Secondary | ICD-10-CM | POA: Diagnosis not present

## 2021-10-13 DIAGNOSIS — Z992 Dependence on renal dialysis: Secondary | ICD-10-CM | POA: Diagnosis not present

## 2021-10-14 DIAGNOSIS — Z791 Long term (current) use of non-steroidal anti-inflammatories (NSAID): Secondary | ICD-10-CM | POA: Diagnosis not present

## 2021-10-14 DIAGNOSIS — E785 Hyperlipidemia, unspecified: Secondary | ICD-10-CM | POA: Diagnosis not present

## 2021-10-14 DIAGNOSIS — Z7902 Long term (current) use of antithrombotics/antiplatelets: Secondary | ICD-10-CM | POA: Diagnosis not present

## 2021-10-14 DIAGNOSIS — N2581 Secondary hyperparathyroidism of renal origin: Secondary | ICD-10-CM | POA: Diagnosis not present

## 2021-10-14 DIAGNOSIS — G8929 Other chronic pain: Secondary | ICD-10-CM | POA: Diagnosis not present

## 2021-10-14 DIAGNOSIS — R69 Illness, unspecified: Secondary | ICD-10-CM | POA: Diagnosis not present

## 2021-10-14 DIAGNOSIS — N186 End stage renal disease: Secondary | ICD-10-CM | POA: Diagnosis not present

## 2021-10-14 DIAGNOSIS — I120 Hypertensive chronic kidney disease with stage 5 chronic kidney disease or end stage renal disease: Secondary | ICD-10-CM | POA: Diagnosis not present

## 2021-10-14 DIAGNOSIS — Z992 Dependence on renal dialysis: Secondary | ICD-10-CM | POA: Diagnosis not present

## 2021-10-14 DIAGNOSIS — I69351 Hemiplegia and hemiparesis following cerebral infarction affecting right dominant side: Secondary | ICD-10-CM | POA: Diagnosis not present

## 2021-10-15 ENCOUNTER — Encounter: Payer: Self-pay | Admitting: Medical

## 2021-10-15 ENCOUNTER — Ambulatory Visit (INDEPENDENT_AMBULATORY_CARE_PROVIDER_SITE_OTHER): Payer: Medicare HMO | Admitting: Medical

## 2021-10-15 VITALS — BP 140/80 | HR 100 | Resp 18 | Ht 70.0 in | Wt 147.2 lb

## 2021-10-15 DIAGNOSIS — N186 End stage renal disease: Secondary | ICD-10-CM

## 2021-10-15 DIAGNOSIS — E785 Hyperlipidemia, unspecified: Secondary | ICD-10-CM | POA: Diagnosis not present

## 2021-10-15 DIAGNOSIS — Z992 Dependence on renal dialysis: Secondary | ICD-10-CM | POA: Diagnosis not present

## 2021-10-15 DIAGNOSIS — I10 Essential (primary) hypertension: Secondary | ICD-10-CM

## 2021-10-15 MED ORDER — SERTRALINE HCL 50 MG PO TABS: 50.0000 mg | ORAL_TABLET | Freq: Every day | ORAL | 11 refills | Status: DC

## 2021-10-15 NOTE — Patient Instructions (Addendum)
Htn- bp 140/80 today. Continue multi-med regimen.  ? ?Ckd and on dialysis.  ? ?High cholesterol. Continue pravachol and placing future cmp and lipid panel labs today. ? ?Depression- mood stable on sertraline. Continue current 50 mg dose. Rx advisement.  ? ?Follow up date to be determined after lab review. ?

## 2021-10-15 NOTE — Progress Notes (Signed)
? ?Subjective:  ? ? Patient ID: Derrick Moore, male    DOB: 1965-11-18, 56 y.o.   MRN: [id number] ? ?HPI ? ?Pt in for follow up. ? ?Htn- on amlodipine 5 mg daily,hydralazine 50 mg bid and lisinopril 30 mg daily and labetolol 200 mg bid. Mild high today. Always in that range per. Pt. ? ? ?Pt on dialysis for ckd. Goes regularly.  ? ?Depression- pt states mood stable and not having any issues with anger. In past had video visit with behavioral health and did not get much out of that visit. Did not continue. Pt is taking sertraline. Sems to help and no side effects. ? ?High cholesterol and on pravachol.  ? ? ? ? ?Review of Systems  ?Constitutional:  Negative for chills, fatigue and fever.  ?HENT:  Negative for congestion and drooling.   ?Respiratory:  Negative for cough, chest tightness, shortness of breath and wheezing.   ?Cardiovascular:  Negative for chest pain and palpitations.  ?Gastrointestinal:  Negative for abdominal pain, blood in stool, constipation, diarrhea and nausea.  ?Genitourinary:  Negative for dysuria, flank pain and frequency.  ?Musculoskeletal:  Negative for back pain and joint swelling.  ?Neurological:  Negative for dizziness, speech difficulty, numbness and headaches.  ?Hematological:  Negative for adenopathy. Does not bruise/bleed easily.  ?Psychiatric/Behavioral:  Negative for behavioral problems, decreased concentration and dysphoric mood.   ? ?Past Medical History:  ?Diagnosis Date  ? Dialysis patient [facility])   ? Hyperlipidemia   ? Hypertension   ? Renal failure   ? Renal failure   ? Stroke [facility])   ? ?  ?Social History  ? ?Socioeconomic History  ? Marital status: Single  ?  Spouse name: Not on file  ? Number of children: Not on file  ? Years of education: Not on file  ? Highest education level: Not on file  ?Occupational History  ? Not on file  ?Tobacco Use  ? Smoking status: Every Day  ?  Packs/day: 0.25  ?  Years: 33.00  ?  Pack years: 8.25  ?  Types: Cigarettes  ? Smokeless tobacco: Never   ?Vaping Use  ? Vaping Use: Never used  ?Substance and Sexual Activity  ? Alcohol use: Not Currently  ?  Alcohol/week: 1.0 standard drink  ?  Types: 1 Cans of beer per week  ? Drug use: Yes  ?  Types: Marijuana  ? Sexual activity: Not on file  ?Other Topics Concern  ? Not on file  ?Social History Narrative  ? Not on file  ? ?Social Determinants of Health  ? ?Financial Resource Strain: Not on file  ?Food Insecurity: Not on file  ?Transportation Needs: Not on file  ?Physical Activity: Not on file  ?Stress: Not on file  ?Social Connections: Not on file  ?Intimate Partner Violence: Not on file  ? ? ?No past surgical history on file. ? ?No family history on file. ? ?No Known Allergies ? ?Current Outpatient Medications on File Prior to Visit  ?Medication Sig Dispense Refill  ? amLODipine (NORVASC) 5 MG tablet Take 5 mg by mouth at bedtime.    ? clopidogrel (PLAVIX) 75 MG tablet Take 75 mg by mouth daily.    ? hydrALAZINE (APRESOLINE) 50 MG tablet Take 50 mg by mouth 2 (two) times daily.    ? JUBLIA 10 % SOLN Apply topically.    ? labetalol (NORMODYNE) 200 MG tablet Take 200 mg by mouth 2 (two) times daily.    ? LASIX 40  MG tablet Take 40 mg by mouth every morning.    ? lidocaine (LIDODERM) 5 % Place 1 patch onto the skin daily. Remove & Discard patch within 12 hours or as directed by MD 30 patch 0  ? lisinopril (ZESTRIL) 30 MG tablet Take 30 mg by mouth at bedtime.    ? naproxen (NAPROSYN) 500 MG tablet Take 1 tablet (500 mg total) by mouth 2 (two) times daily. 30 tablet 0  ? pravastatin (PRAVACHOL) 10 MG tablet Take 10 mg by mouth daily.    ? RAYOS 5 MG TBEC Take 1 tablet by mouth daily.    ? sertraline (ZOLOFT) 50 MG tablet Take 1 tablet (50 mg total) by mouth daily. 30 tablet 3  ? sildenafil (VIAGRA) 50 MG tablet Take 1 tablet (50 mg total) by mouth daily as needed for erectile dysfunction. 10 tablet 0  ? ?No current facility-administered medications on file prior to visit.  ? ? ?BP 140/80   Pulse 100   Resp 18   Ht  5\' 10"  (1.778 m)   Wt 147 lb 3.2 oz (66.8 kg)   SpO2 100%   BMI 21.12 kg/m?  ?  ? ?   ?Objective:  ? Physical Exam ? ? ?General ?Mental Status- Alert. General Appearance- Not in acute distress.  ? ?Skin ?General: Color- Normal Color. Moisture- Normal Moisture. ? ?Neck ?Carotid Arteries- Normal color. Moisture- Normal Moisture. No carotid bruits. No JVD. ? ?Chest and Lung Exam ?Auscultation: ?Breath Sounds:-Normal. ? ?Cardiovascular ?Auscultation:Rythm- Regular. ?Murmurs & Other Heart Sounds:Auscultation of the heart reveals- No Murmurs. ? ?Neurologic ?Cranial Nerve exam:- CN III-XII intact(No nystagmus), symmetric smile. ?Strength:- 5/5 equal and symmetric strength both upper and lower extremities.  ? ?   ?Assessment & Plan:  ? ?Patient Instructions  ?Htn- bp 140/80 today. Continue multi-med regimen.  ? ?Ckd and on dialysis.  ? ?High cholesterol. Continue pravachol and placing future cmp and lipid panel labs today. ? ?Depression- mood stable on sertraline. Continue current 50 mg dose. ? ?Follow up date to be determined after lab review.  ? ?[name], PA-C  ?

## 2021-10-16 DIAGNOSIS — N186 End stage renal disease: Secondary | ICD-10-CM | POA: Diagnosis not present

## 2021-10-16 DIAGNOSIS — Z992 Dependence on renal dialysis: Secondary | ICD-10-CM | POA: Diagnosis not present

## 2021-10-17 ENCOUNTER — Other Ambulatory Visit: Payer: Medicare HMO

## 2021-10-17 DIAGNOSIS — N186 End stage renal disease: Secondary | ICD-10-CM | POA: Diagnosis not present

## 2021-10-17 DIAGNOSIS — Z992 Dependence on renal dialysis: Secondary | ICD-10-CM | POA: Diagnosis not present

## 2021-10-18 DIAGNOSIS — Z992 Dependence on renal dialysis: Secondary | ICD-10-CM | POA: Diagnosis not present

## 2021-10-18 DIAGNOSIS — N186 End stage renal disease: Secondary | ICD-10-CM | POA: Diagnosis not present

## 2021-10-19 DIAGNOSIS — N186 End stage renal disease: Secondary | ICD-10-CM | POA: Diagnosis not present

## 2021-10-19 DIAGNOSIS — Z992 Dependence on renal dialysis: Secondary | ICD-10-CM | POA: Diagnosis not present

## 2021-10-20 DIAGNOSIS — Z992 Dependence on renal dialysis: Secondary | ICD-10-CM | POA: Diagnosis not present

## 2021-10-20 DIAGNOSIS — N186 End stage renal disease: Secondary | ICD-10-CM | POA: Diagnosis not present

## 2021-10-21 DIAGNOSIS — Z992 Dependence on renal dialysis: Secondary | ICD-10-CM | POA: Diagnosis not present

## 2021-10-21 DIAGNOSIS — N186 End stage renal disease: Secondary | ICD-10-CM | POA: Diagnosis not present

## 2021-10-22 DIAGNOSIS — Z992 Dependence on renal dialysis: Secondary | ICD-10-CM | POA: Diagnosis not present

## 2021-10-22 DIAGNOSIS — N186 End stage renal disease: Secondary | ICD-10-CM | POA: Diagnosis not present

## 2021-10-23 DIAGNOSIS — Z992 Dependence on renal dialysis: Secondary | ICD-10-CM | POA: Diagnosis not present

## 2021-10-23 DIAGNOSIS — N186 End stage renal disease: Secondary | ICD-10-CM | POA: Diagnosis not present

## 2021-10-24 DIAGNOSIS — N186 End stage renal disease: Secondary | ICD-10-CM | POA: Diagnosis not present

## 2021-10-24 DIAGNOSIS — Z992 Dependence on renal dialysis: Secondary | ICD-10-CM | POA: Diagnosis not present

## 2021-10-25 DIAGNOSIS — Z992 Dependence on renal dialysis: Secondary | ICD-10-CM | POA: Diagnosis not present

## 2021-10-25 DIAGNOSIS — N186 End stage renal disease: Secondary | ICD-10-CM | POA: Diagnosis not present

## 2021-10-26 DIAGNOSIS — N186 End stage renal disease: Secondary | ICD-10-CM | POA: Diagnosis not present

## 2021-10-26 DIAGNOSIS — Z992 Dependence on renal dialysis: Secondary | ICD-10-CM | POA: Diagnosis not present

## 2021-10-27 DIAGNOSIS — Z992 Dependence on renal dialysis: Secondary | ICD-10-CM | POA: Diagnosis not present

## 2021-10-27 DIAGNOSIS — N186 End stage renal disease: Secondary | ICD-10-CM | POA: Diagnosis not present

## 2021-10-28 DIAGNOSIS — Z992 Dependence on renal dialysis: Secondary | ICD-10-CM | POA: Diagnosis not present

## 2021-10-28 DIAGNOSIS — N186 End stage renal disease: Secondary | ICD-10-CM | POA: Diagnosis not present

## 2021-10-29 DIAGNOSIS — Z992 Dependence on renal dialysis: Secondary | ICD-10-CM | POA: Diagnosis not present

## 2021-10-29 DIAGNOSIS — N186 End stage renal disease: Secondary | ICD-10-CM | POA: Diagnosis not present

## 2021-10-30 DIAGNOSIS — Z992 Dependence on renal dialysis: Secondary | ICD-10-CM | POA: Diagnosis not present

## 2021-10-30 DIAGNOSIS — N186 End stage renal disease: Secondary | ICD-10-CM | POA: Diagnosis not present

## 2021-10-31 DIAGNOSIS — Z992 Dependence on renal dialysis: Secondary | ICD-10-CM | POA: Diagnosis not present

## 2021-10-31 DIAGNOSIS — N186 End stage renal disease: Secondary | ICD-10-CM | POA: Diagnosis not present

## 2021-11-01 DIAGNOSIS — N186 End stage renal disease: Secondary | ICD-10-CM | POA: Diagnosis not present

## 2021-11-01 DIAGNOSIS — Z992 Dependence on renal dialysis: Secondary | ICD-10-CM | POA: Diagnosis not present

## 2021-11-02 DIAGNOSIS — Z992 Dependence on renal dialysis: Secondary | ICD-10-CM | POA: Diagnosis not present

## 2021-11-02 DIAGNOSIS — N186 End stage renal disease: Secondary | ICD-10-CM | POA: Diagnosis not present

## 2021-11-03 DIAGNOSIS — Z992 Dependence on renal dialysis: Secondary | ICD-10-CM | POA: Diagnosis not present

## 2021-11-03 DIAGNOSIS — N186 End stage renal disease: Secondary | ICD-10-CM | POA: Diagnosis not present

## 2021-11-04 DIAGNOSIS — Z992 Dependence on renal dialysis: Secondary | ICD-10-CM | POA: Diagnosis not present

## 2021-11-04 DIAGNOSIS — N186 End stage renal disease: Secondary | ICD-10-CM | POA: Diagnosis not present

## 2021-11-05 DIAGNOSIS — Z992 Dependence on renal dialysis: Secondary | ICD-10-CM | POA: Diagnosis not present

## 2021-11-05 DIAGNOSIS — N186 End stage renal disease: Secondary | ICD-10-CM | POA: Diagnosis not present

## 2021-11-06 DIAGNOSIS — Z13220 Encounter for screening for lipoid disorders: Secondary | ICD-10-CM | POA: Diagnosis not present

## 2021-11-06 DIAGNOSIS — N186 End stage renal disease: Secondary | ICD-10-CM | POA: Diagnosis not present

## 2021-11-06 DIAGNOSIS — Z992 Dependence on renal dialysis: Secondary | ICD-10-CM | POA: Diagnosis not present

## 2021-11-07 DIAGNOSIS — Z992 Dependence on renal dialysis: Secondary | ICD-10-CM | POA: Diagnosis not present

## 2021-11-07 DIAGNOSIS — N186 End stage renal disease: Secondary | ICD-10-CM | POA: Diagnosis not present

## 2021-11-08 DIAGNOSIS — N186 End stage renal disease: Secondary | ICD-10-CM | POA: Diagnosis not present

## 2021-11-08 DIAGNOSIS — Z992 Dependence on renal dialysis: Secondary | ICD-10-CM | POA: Diagnosis not present

## 2021-11-09 DIAGNOSIS — Z992 Dependence on renal dialysis: Secondary | ICD-10-CM | POA: Diagnosis not present

## 2021-11-09 DIAGNOSIS — N186 End stage renal disease: Secondary | ICD-10-CM | POA: Diagnosis not present

## 2021-11-10 DIAGNOSIS — N186 End stage renal disease: Secondary | ICD-10-CM | POA: Diagnosis not present

## 2021-11-10 DIAGNOSIS — Z992 Dependence on renal dialysis: Secondary | ICD-10-CM | POA: Diagnosis not present

## 2021-11-11 DIAGNOSIS — N186 End stage renal disease: Secondary | ICD-10-CM | POA: Diagnosis not present

## 2021-11-11 DIAGNOSIS — Z992 Dependence on renal dialysis: Secondary | ICD-10-CM | POA: Diagnosis not present

## 2021-11-12 DIAGNOSIS — N186 End stage renal disease: Secondary | ICD-10-CM | POA: Diagnosis not present

## 2021-11-12 DIAGNOSIS — Z992 Dependence on renal dialysis: Secondary | ICD-10-CM | POA: Diagnosis not present

## 2021-11-13 DIAGNOSIS — Z992 Dependence on renal dialysis: Secondary | ICD-10-CM | POA: Diagnosis not present

## 2021-11-13 DIAGNOSIS — N186 End stage renal disease: Secondary | ICD-10-CM | POA: Diagnosis not present

## 2021-11-14 DIAGNOSIS — N186 End stage renal disease: Secondary | ICD-10-CM | POA: Diagnosis not present

## 2021-11-14 DIAGNOSIS — Z992 Dependence on renal dialysis: Secondary | ICD-10-CM | POA: Diagnosis not present

## 2021-11-15 DIAGNOSIS — N186 End stage renal disease: Secondary | ICD-10-CM | POA: Diagnosis not present

## 2021-11-15 DIAGNOSIS — Z992 Dependence on renal dialysis: Secondary | ICD-10-CM | POA: Diagnosis not present

## 2021-11-16 DIAGNOSIS — Z992 Dependence on renal dialysis: Secondary | ICD-10-CM | POA: Diagnosis not present

## 2021-11-16 DIAGNOSIS — N186 End stage renal disease: Secondary | ICD-10-CM | POA: Diagnosis not present

## 2021-11-17 DIAGNOSIS — Z992 Dependence on renal dialysis: Secondary | ICD-10-CM | POA: Diagnosis not present

## 2021-11-17 DIAGNOSIS — N186 End stage renal disease: Secondary | ICD-10-CM | POA: Diagnosis not present

## 2021-11-18 DIAGNOSIS — N186 End stage renal disease: Secondary | ICD-10-CM | POA: Diagnosis not present

## 2021-11-18 DIAGNOSIS — Z992 Dependence on renal dialysis: Secondary | ICD-10-CM | POA: Diagnosis not present

## 2021-11-19 DIAGNOSIS — N186 End stage renal disease: Secondary | ICD-10-CM | POA: Diagnosis not present

## 2021-11-19 DIAGNOSIS — Z992 Dependence on renal dialysis: Secondary | ICD-10-CM | POA: Diagnosis not present

## 2021-11-20 DIAGNOSIS — Z992 Dependence on renal dialysis: Secondary | ICD-10-CM | POA: Diagnosis not present

## 2021-11-20 DIAGNOSIS — N186 End stage renal disease: Secondary | ICD-10-CM | POA: Diagnosis not present

## 2021-11-21 ENCOUNTER — Other Ambulatory Visit: Payer: Self-pay

## 2021-11-21 ENCOUNTER — Emergency Department (HOSPITAL_BASED_OUTPATIENT_CLINIC_OR_DEPARTMENT_OTHER): Payer: Medicare HMO

## 2021-11-21 ENCOUNTER — Encounter (HOSPITAL_BASED_OUTPATIENT_CLINIC_OR_DEPARTMENT_OTHER): Payer: Self-pay

## 2021-11-21 ENCOUNTER — Emergency Department (HOSPITAL_BASED_OUTPATIENT_CLINIC_OR_DEPARTMENT_OTHER)
Admission: EM | Admit: 2021-11-21 | Discharge: 2021-11-21 | Disposition: A | Discharge status: Home / Self Care | Payer: Medicare HMO | Attending: Emergency Medicine | Admitting: Emergency Medicine

## 2021-11-21 DIAGNOSIS — N186 End stage renal disease: Secondary | ICD-10-CM | POA: Diagnosis not present

## 2021-11-21 DIAGNOSIS — Z7902 Long term (current) use of antithrombotics/antiplatelets: Secondary | ICD-10-CM | POA: Diagnosis not present

## 2021-11-21 DIAGNOSIS — Z79899 Other long term (current) drug therapy: Secondary | ICD-10-CM | POA: Diagnosis not present

## 2021-11-21 DIAGNOSIS — M25551 Pain in right hip: Secondary | ICD-10-CM | POA: Insufficient documentation

## 2021-11-21 DIAGNOSIS — Z992 Dependence on renal dialysis: Secondary | ICD-10-CM | POA: Diagnosis not present

## 2021-11-21 DIAGNOSIS — M25552 Pain in left hip: Secondary | ICD-10-CM | POA: Diagnosis not present

## 2021-11-21 MED ORDER — LIDOCAINE 5 % EX PTCH: 1.0000 | MEDICATED_PATCH | CUTANEOUS | 0 refills | Status: DC

## 2021-11-21 MED ORDER — METHOCARBAMOL 500 MG PO TABS: 500.0000 mg | ORAL_TABLET | Freq: Two times a day (BID) | ORAL | 0 refills | Status: DC

## 2021-11-21 NOTE — Discharge Instructions (Signed)
Your work-up today is reassuring.  X-ray does not show any concerning findings.  I have given you a sports medicine follow-up with Dr. Raeford Razor listed above.  Please call and schedule a follow-up appointment.  Have also sent in lidocaine patch and muscle relaxer.  Given your end-stage renal disease I would advise that she not take naproxen.  If you have any worsening symptoms you can return to the emergency room for evaluation. ?

## 2021-11-21 NOTE — ED Provider Notes (Signed)
?[facility] ?Provider Note ? ? ?CSN: [id number] ?Arrival date & time: 11/21/21  1247 ? ?  ? ?History ? ?Chief Complaint  ?Patient presents with  ? Leg Pain  ? ? ?FITZHUGH VIZCARRONDO is a 56 y.o. male. ? ?56 year old male presents today for evaluation of right hip pain.  He states this has been ongoing for several months however over the past week it has flared up again.  He has been taking naproxen as well as extra strength Tylenol with good relief.  Denies any back pain.  Denies fever, chills.  Also denies radiculopathy, history of cancer, history of IV drug use, or unintentional weight loss.  He does have history of end-stage renal disease and is on dialysis.  He has not seen an orthopedist for this. ? ?The history is provided by the patient. No language interpreter was used.  ? ?  ? ?Home Medications ?Prior to Admission medications   ?Medication Sig Start Date End Date Taking? Authorizing Provider  ?amLODipine (NORVASC) 5 MG tablet Take 5 mg by mouth at bedtime. 07/04/20   [provider]  ?clopidogrel (PLAVIX) 75 MG tablet Take 75 mg by mouth daily. 07/04/20   [provider]  ?hydrALAZINE (APRESOLINE) 50 MG tablet Take 50 mg by mouth 2 (two) times daily. 07/04/20   [provider]  ?JUBLIA 10 % SOLN Apply topically. 09/06/20   [provider]  ?labetalol (NORMODYNE) 200 MG tablet Take 200 mg by mouth 2 (two) times daily. 07/04/20   [provider]  ?LASIX 40 MG tablet Take 40 mg by mouth every morning. 07/04/20   [provider]  ?lidocaine (LIDODERM) 5 % Place 1 patch onto the skin daily. Remove & Discard patch within 12 hours or as directed by MD 09/09/21   Redwine, Madison A, PA-C  ?lisinopril (ZESTRIL) 30 MG tablet Take 30 mg by mouth at bedtime. 07/04/20   [provider]  ?naproxen (NAPROSYN) 500 MG tablet Take 1 tablet (500 mg total) by mouth 2 (two) times daily. 09/09/21   Redwine, Madison A, PA-C  ?pravastatin  (PRAVACHOL) 10 MG tablet Take 10 mg by mouth daily. 07/04/20   [provider]  ?RAYOS 5 MG TBEC Take 1 tablet by mouth daily. 08/29/20   [provider]  ?sertraline (ZOLOFT) 50 MG tablet Take 1 tablet (50 mg total) by mouth daily. 10/15/21   Saguier, Percell Miller, PA-C  ?sildenafil (VIAGRA) 50 MG tablet Take 1 tablet (50 mg total) by mouth daily as needed for erectile dysfunction. 09/11/20   Saguier, Percell Miller, PA-C  ?   ? ?Allergies    ?Patient has no known allergies.   ? ?Review of Systems   ?Review of Systems  ?Constitutional:  Negative for chills and fever.  ?Musculoskeletal:  Positive for arthralgias. Negative for back pain and gait problem.  ?All other systems reviewed and are negative. ? ?Physical Exam ?Updated Vital Signs ?BP (!) 144/101 (BP Location: Right Arm) Comment: pt states he forgot to take his HTN meds  Pulse 79   Temp 98.3 ?F (36.8 ?C) (Oral)   Resp 20   Ht 5' 10.5" (1.791 m)   Wt 70.3 kg   SpO2 100%   BMI 21.93 kg/m?  ?Physical Exam ?Vitals and nursing note reviewed.  ?Constitutional:   ?   General: He is not in acute distress. ?   Appearance: Normal appearance. He is not ill-appearing.  ?HENT:  ?   Head: Normocephalic and atraumatic.  ?  Nose: Nose normal.  ?Eyes:  ?   General: No scleral icterus. ?   Extraocular Movements: Extraocular movements intact.  ?   Conjunctiva/sclera: Conjunctivae normal.  ?Cardiovascular:  ?   Rate and Rhythm: Normal rate and regular rhythm.  ?   Pulses: Normal pulses.  ?   Heart sounds: Normal heart sounds.  ?Pulmonary:  ?   Effort: Pulmonary effort is normal. No respiratory distress.  ?   Breath sounds: Normal breath sounds. No wheezing or rales.  ?Abdominal:  ?   General: There is no distension.  ?   Palpations: Abdomen is soft.  ?   Tenderness: There is no abdominal tenderness. There is no guarding.  ?Musculoskeletal:     ?   General: Normal range of motion.  ?   Cervical back: Normal range of motion.  ?   Right lower leg: No edema.  ?   Left lower  leg: No edema.  ?   Comments: Bilateral lower extremities with full range of motion.  Posterior right hip pain present.  5/5 strength in bilateral lower extremities and flexor and extensor muscle groups.  Patient able to ambulate without difficulty.  Cervical, thoracic, lumbar spine without tenderness to palpation.  Paraspinal muscles without tenderness to palpation.  ?Skin: ?   General: Skin is warm and dry.  ?Neurological:  ?   General: No focal deficit present.  ?   Mental Status: He is alert. Mental status is at baseline.  ? ? ?ED Results / Procedures / Treatments   ?Labs ?(all labs ordered are listed, but only abnormal results are displayed) ?Labs Reviewed - No data to display ? ?EKG ?None ? ?Radiology ?DG Hip Unilat W or Wo Pelvis 2-3 Views Right ? ?Result Date: 11/21/2021 ?CLINICAL DATA:  Pt complains of bilateral hip pain but the right hip is worse. States he has had pain for a few months but this past week has been worse. States he states up most of the time at his job. No hx of hip injury or surgeries. right hip pain EXAM: DG HIP (WITH OR WITHOUT PELVIS) 2-3V RIGHT COMPARISON:  None. FINDINGS: Hips are located. No evidence of pelvic fracture or sacral fracture. Dedicated view of the RIGHT hip demonstrates no femoral neck fracture. IMPRESSION: 1. No fracture dislocation. 2. Significant arthropathy identified. Electronically Signed   By: Suzy Bouchard M.D.   On: 11/21/2021 14:52   ? ?Procedures ?Procedures  ? ? ?Medications Ordered in ED ?Medications - No data to display ? ?ED Course/ Medical Decision Making/ A&P ?  ?                        ?Medical Decision Making ?Amount and/or Complexity of Data Reviewed ?Radiology: ordered. ? ? ?56 year old male presents today for evaluation of right hip pain.  This has been ongoing for several months and was previously evaluated in this emergency room as well.  He has been taking extra strength Tylenol and naproxen with good relief.  Denies back pain.  He is without  any red flag signs or symptoms concerning for cauda equina syndrome, or spinal epidural abscess.  He has good strength and he is neurovascularly intact.  X-ray is without any acute findings.  We will provide patient with lidocaine patch and muscle relaxer they can take in addition to his Tylenol.  We will provide him referral to Dr. Raeford Razor for sports medicine follow-up.  Patient voices understanding and is in agreement with plan. ? ? ?  Final Clinical Impression(s) / ED Diagnoses ?Final diagnoses:  ?Right hip pain  ? ? ?Rx / DC Orders ?ED Discharge Orders   ? ?      Ordered  ?  lidocaine (LIDODERM) 5 %  Every 24 hours       ? 11/21/21 1509  ?  methocarbamol (ROBAXIN) 500 MG tablet  2 times daily       ? 11/21/21 1509  ? ?  ?  ? ?  ? ? ?  ?Evlyn Courier, PA-C ?11/21/21 1519 ? ?  ?Lianne Cure, DO ?03/[birth year] 2112 ? ?

## 2021-11-21 NOTE — ED Triage Notes (Signed)
Pt c/o bilateral hip pain x 1 year with acute worsening this week. Pt ambulatory into triage.  ?

## 2021-11-22 DIAGNOSIS — N186 End stage renal disease: Secondary | ICD-10-CM | POA: Diagnosis not present

## 2021-11-22 DIAGNOSIS — Z992 Dependence on renal dialysis: Secondary | ICD-10-CM | POA: Diagnosis not present

## 2021-11-23 DIAGNOSIS — Z992 Dependence on renal dialysis: Secondary | ICD-10-CM | POA: Diagnosis not present

## 2021-11-23 DIAGNOSIS — N186 End stage renal disease: Secondary | ICD-10-CM | POA: Diagnosis not present

## 2021-11-24 DIAGNOSIS — N186 End stage renal disease: Secondary | ICD-10-CM | POA: Diagnosis not present

## 2021-11-24 DIAGNOSIS — Z992 Dependence on renal dialysis: Secondary | ICD-10-CM | POA: Diagnosis not present

## 2021-11-25 DIAGNOSIS — N186 End stage renal disease: Secondary | ICD-10-CM | POA: Diagnosis not present

## 2021-11-25 DIAGNOSIS — Z992 Dependence on renal dialysis: Secondary | ICD-10-CM | POA: Diagnosis not present

## 2021-11-26 DIAGNOSIS — N186 End stage renal disease: Secondary | ICD-10-CM | POA: Diagnosis not present

## 2021-11-26 DIAGNOSIS — Z992 Dependence on renal dialysis: Secondary | ICD-10-CM | POA: Diagnosis not present

## 2021-11-27 DIAGNOSIS — N186 End stage renal disease: Secondary | ICD-10-CM | POA: Diagnosis not present

## 2021-11-27 DIAGNOSIS — Z992 Dependence on renal dialysis: Secondary | ICD-10-CM | POA: Diagnosis not present

## 2021-11-28 DIAGNOSIS — Z992 Dependence on renal dialysis: Secondary | ICD-10-CM | POA: Diagnosis not present

## 2021-11-28 DIAGNOSIS — N186 End stage renal disease: Secondary | ICD-10-CM | POA: Diagnosis not present

## 2021-11-29 DIAGNOSIS — N186 End stage renal disease: Secondary | ICD-10-CM | POA: Diagnosis not present

## 2021-11-29 DIAGNOSIS — Z992 Dependence on renal dialysis: Secondary | ICD-10-CM | POA: Diagnosis not present

## 2021-11-30 DIAGNOSIS — N186 End stage renal disease: Secondary | ICD-10-CM | POA: Diagnosis not present

## 2021-11-30 DIAGNOSIS — Z992 Dependence on renal dialysis: Secondary | ICD-10-CM | POA: Diagnosis not present

## 2021-12-01 DIAGNOSIS — N186 End stage renal disease: Secondary | ICD-10-CM | POA: Diagnosis not present

## 2021-12-01 DIAGNOSIS — Z992 Dependence on renal dialysis: Secondary | ICD-10-CM | POA: Diagnosis not present

## 2021-12-02 DIAGNOSIS — N186 End stage renal disease: Secondary | ICD-10-CM | POA: Diagnosis not present

## 2021-12-02 DIAGNOSIS — Z992 Dependence on renal dialysis: Secondary | ICD-10-CM | POA: Diagnosis not present

## 2021-12-03 DIAGNOSIS — N186 End stage renal disease: Secondary | ICD-10-CM | POA: Diagnosis not present

## 2021-12-03 DIAGNOSIS — Z992 Dependence on renal dialysis: Secondary | ICD-10-CM | POA: Diagnosis not present

## 2021-12-04 DIAGNOSIS — Z992 Dependence on renal dialysis: Secondary | ICD-10-CM | POA: Diagnosis not present

## 2021-12-04 DIAGNOSIS — N186 End stage renal disease: Secondary | ICD-10-CM | POA: Diagnosis not present

## 2021-12-05 ENCOUNTER — Ambulatory Visit (INDEPENDENT_AMBULATORY_CARE_PROVIDER_SITE_OTHER): Payer: Medicare HMO | Admitting: Family Medicine

## 2021-12-05 ENCOUNTER — Ambulatory Visit (HOSPITAL_BASED_OUTPATIENT_CLINIC_OR_DEPARTMENT_OTHER)
Admission: RE | Admit: 2021-12-05 | Discharge: 2021-12-05 | Disposition: A | Discharge status: Home / Self Care | Payer: Medicare HMO | Source: Ambulatory Visit | Attending: Family Medicine | Admitting: Family Medicine

## 2021-12-05 ENCOUNTER — Encounter: Payer: Self-pay | Admitting: Family Medicine

## 2021-12-05 VITALS — BP 130/90 | Ht 70.5 in | Wt 155.0 lb

## 2021-12-05 DIAGNOSIS — Z992 Dependence on renal dialysis: Secondary | ICD-10-CM | POA: Diagnosis not present

## 2021-12-05 DIAGNOSIS — M533 Sacrococcygeal disorders, not elsewhere classified: Secondary | ICD-10-CM

## 2021-12-05 DIAGNOSIS — M5136 Other intervertebral disc degeneration, lumbar region: Secondary | ICD-10-CM | POA: Diagnosis not present

## 2021-12-05 DIAGNOSIS — M545 Low back pain, unspecified: Secondary | ICD-10-CM | POA: Diagnosis not present

## 2021-12-05 DIAGNOSIS — M461 Sacroiliitis, not elsewhere classified: Secondary | ICD-10-CM | POA: Insufficient documentation

## 2021-12-05 DIAGNOSIS — N186 End stage renal disease: Secondary | ICD-10-CM | POA: Diagnosis not present

## 2021-12-05 MED ORDER — PREDNISONE 5 MG PO TABS: ORAL_TABLET | ORAL | 0 refills | Status: DC

## 2021-12-05 NOTE — Progress Notes (Signed)
?  Derrick Moore - 56 y.o. male MRN [id number]  Date of birth: 1966-02-10 ? ?SUBJECTIVE:  Including CC & ROS.  ?No chief complaint on file. ? ? ?Derrick Moore is a 56 y.o. male that is presenting with acute on chronic right gluteal pain.  The pain is in the right lower back and SI joint.  He does experience right leg pain intermittently.  No injury or inciting event.  Only pain with walking.  No pain at rest or sitting. ? ?Review of the emergency department note from 4/24 shows he was provided Robaxin and Lidoderm patches ?Independent review of the right hip x-ray from 4/27 shows no acute changes. ? ?Review of Systems ?See HPI  ? ?HISTORY: Past Medical, Surgical, Social, and Family History Reviewed & Updated per EMR.   ?Pertinent Historical Findings include: ? ?Past Medical History:  ?Diagnosis Date  ? Dialysis patient [facility])   ? Hyperlipidemia   ? Hypertension   ? Renal failure   ? Renal failure   ? Stroke [facility])   ? ? ?History reviewed. No pertinent surgical history. ? ? ?PHYSICAL EXAM:  ?VS: BP 130/90 (BP Location: Left Arm, Patient Position: Sitting)   Ht 5' 10.5" (1.791 m)   Wt 155 lb (70.3 kg)   BMI 21.93 kg/m?  ?Physical Exam ?Gen: NAD, alert, cooperative with exam, well-appearing ?MSK:  ?Neurovascularly intact   ? ? ? ? ?ASSESSMENT & PLAN:  ? ?Sacroiliac joint dysfunction of right side ?Acutely occurring.  Having tenderness over the right SI joint.  Pain in the gluteus seems more associated with the SI joint or lower back as opposed to the right hip joint. ?-Counseled on home exercise therapy and supportive care. ?-Prednisone. ?-Lumbar x-ray. ?-Could consider injection or physical therapy. ? ? ? ? ?

## 2021-12-05 NOTE — Assessment & Plan Note (Signed)
Acutely occurring.  Having tenderness over the right SI joint.  Pain in the gluteus seems more associated with the SI joint or lower back as opposed to the right hip joint. ?-Counseled on home exercise therapy and supportive care. ?-Prednisone. ?-Lumbar x-ray. ?-Could consider injection or physical therapy. ?

## 2021-12-05 NOTE — Patient Instructions (Addendum)
Good to see you ?Please try heat on the area. ?You can consider compression. ?Please try the exercises. ?I will call with the x-ray results f with acute on chronic with presenting presenting with acute on chronic right gluteal pain ?Please send me a message in MyChart with any questions or updates.  ?Please see me back in 4 weeks.  ? ?--Dr. Raeford Razor ? ?

## 2021-12-06 DIAGNOSIS — Z992 Dependence on renal dialysis: Secondary | ICD-10-CM | POA: Diagnosis not present

## 2021-12-06 DIAGNOSIS — N186 End stage renal disease: Secondary | ICD-10-CM | POA: Diagnosis not present

## 2021-12-07 DIAGNOSIS — Z992 Dependence on renal dialysis: Secondary | ICD-10-CM | POA: Diagnosis not present

## 2021-12-07 DIAGNOSIS — N186 End stage renal disease: Secondary | ICD-10-CM | POA: Diagnosis not present

## 2021-12-08 DIAGNOSIS — N186 End stage renal disease: Secondary | ICD-10-CM | POA: Diagnosis not present

## 2021-12-08 DIAGNOSIS — Z992 Dependence on renal dialysis: Secondary | ICD-10-CM | POA: Diagnosis not present

## 2021-12-09 ENCOUNTER — Telehealth: Payer: Self-pay | Admitting: Family Medicine

## 2021-12-09 DIAGNOSIS — Z992 Dependence on renal dialysis: Secondary | ICD-10-CM | POA: Diagnosis not present

## 2021-12-09 DIAGNOSIS — M154 Erosive (osteo)arthritis: Secondary | ICD-10-CM

## 2021-12-09 DIAGNOSIS — N186 End stage renal disease: Secondary | ICD-10-CM | POA: Diagnosis not present

## 2021-12-09 NOTE — Telephone Encounter (Signed)
Informed of results. Will pursue inflammatory work up based on xray imaging.  ? ?Rosemarie Ax, MD ?[facility] ?12/09/2021, 1:10 PM ? ?

## 2021-12-10 DIAGNOSIS — Z992 Dependence on renal dialysis: Secondary | ICD-10-CM | POA: Diagnosis not present

## 2021-12-10 DIAGNOSIS — N186 End stage renal disease: Secondary | ICD-10-CM | POA: Diagnosis not present

## 2021-12-11 DIAGNOSIS — N186 End stage renal disease: Secondary | ICD-10-CM | POA: Diagnosis not present

## 2021-12-11 DIAGNOSIS — Z992 Dependence on renal dialysis: Secondary | ICD-10-CM | POA: Diagnosis not present

## 2021-12-12 DIAGNOSIS — N186 End stage renal disease: Secondary | ICD-10-CM | POA: Diagnosis not present

## 2021-12-12 DIAGNOSIS — Z992 Dependence on renal dialysis: Secondary | ICD-10-CM | POA: Diagnosis not present

## 2021-12-13 DIAGNOSIS — Z992 Dependence on renal dialysis: Secondary | ICD-10-CM | POA: Diagnosis not present

## 2021-12-13 DIAGNOSIS — N186 End stage renal disease: Secondary | ICD-10-CM | POA: Diagnosis not present

## 2021-12-14 DIAGNOSIS — N186 End stage renal disease: Secondary | ICD-10-CM | POA: Diagnosis not present

## 2021-12-14 DIAGNOSIS — Z992 Dependence on renal dialysis: Secondary | ICD-10-CM | POA: Diagnosis not present

## 2021-12-15 DIAGNOSIS — Z992 Dependence on renal dialysis: Secondary | ICD-10-CM | POA: Diagnosis not present

## 2021-12-15 DIAGNOSIS — N186 End stage renal disease: Secondary | ICD-10-CM | POA: Diagnosis not present

## 2021-12-16 DIAGNOSIS — N186 End stage renal disease: Secondary | ICD-10-CM | POA: Diagnosis not present

## 2021-12-16 DIAGNOSIS — Z992 Dependence on renal dialysis: Secondary | ICD-10-CM | POA: Diagnosis not present

## 2021-12-16 NOTE — Addendum Note (Signed)
Addended by: Cresenciano Lick on: 12/16/2021 04:57 PM   Modules accepted: Orders

## 2021-12-16 NOTE — Telephone Encounter (Signed)
Lab orders placed.  

## 2021-12-17 DIAGNOSIS — Z992 Dependence on renal dialysis: Secondary | ICD-10-CM | POA: Diagnosis not present

## 2021-12-17 DIAGNOSIS — M154 Erosive (osteo)arthritis: Secondary | ICD-10-CM | POA: Diagnosis not present

## 2021-12-17 DIAGNOSIS — N186 End stage renal disease: Secondary | ICD-10-CM | POA: Diagnosis not present

## 2021-12-18 DIAGNOSIS — N186 End stage renal disease: Secondary | ICD-10-CM | POA: Diagnosis not present

## 2021-12-18 DIAGNOSIS — Z992 Dependence on renal dialysis: Secondary | ICD-10-CM | POA: Diagnosis not present

## 2021-12-19 DIAGNOSIS — Z992 Dependence on renal dialysis: Secondary | ICD-10-CM | POA: Diagnosis not present

## 2021-12-19 DIAGNOSIS — N186 End stage renal disease: Secondary | ICD-10-CM | POA: Diagnosis not present

## 2021-12-20 DIAGNOSIS — N186 End stage renal disease: Secondary | ICD-10-CM | POA: Diagnosis not present

## 2021-12-20 DIAGNOSIS — Z992 Dependence on renal dialysis: Secondary | ICD-10-CM | POA: Diagnosis not present

## 2021-12-21 DIAGNOSIS — Z992 Dependence on renal dialysis: Secondary | ICD-10-CM | POA: Diagnosis not present

## 2021-12-21 DIAGNOSIS — N186 End stage renal disease: Secondary | ICD-10-CM | POA: Diagnosis not present

## 2021-12-22 DIAGNOSIS — N186 End stage renal disease: Secondary | ICD-10-CM | POA: Diagnosis not present

## 2021-12-22 DIAGNOSIS — Z992 Dependence on renal dialysis: Secondary | ICD-10-CM | POA: Diagnosis not present

## 2021-12-23 DIAGNOSIS — Z992 Dependence on renal dialysis: Secondary | ICD-10-CM | POA: Diagnosis not present

## 2021-12-23 DIAGNOSIS — N186 End stage renal disease: Secondary | ICD-10-CM | POA: Diagnosis not present

## 2021-12-24 DIAGNOSIS — Z992 Dependence on renal dialysis: Secondary | ICD-10-CM | POA: Diagnosis not present

## 2021-12-24 DIAGNOSIS — N186 End stage renal disease: Secondary | ICD-10-CM | POA: Diagnosis not present

## 2021-12-25 DIAGNOSIS — Z992 Dependence on renal dialysis: Secondary | ICD-10-CM | POA: Diagnosis not present

## 2021-12-25 DIAGNOSIS — N186 End stage renal disease: Secondary | ICD-10-CM | POA: Diagnosis not present

## 2021-12-25 LAB — C-REACTIVE PROTEIN: CRP: <1 mg/L (ref 0–10)

## 2021-12-25 LAB — URIC ACID: Uric Acid: 6.3 mg/dL (ref 3.8–8.4)

## 2021-12-25 LAB — ANA: Anti Nuclear Antibody (ANA): POSITIVE — AB

## 2021-12-25 LAB — HLA-B27 ANTIGEN: HLA B27: NEGATIVE

## 2021-12-25 LAB — SEDIMENTATION RATE: Sed Rate: 10 mm/hr (ref 0–30)

## 2021-12-26 DIAGNOSIS — N186 End stage renal disease: Secondary | ICD-10-CM | POA: Diagnosis not present

## 2021-12-26 DIAGNOSIS — Z992 Dependence on renal dialysis: Secondary | ICD-10-CM | POA: Diagnosis not present

## 2021-12-27 ENCOUNTER — Encounter: Payer: Self-pay | Admitting: Family Medicine

## 2021-12-27 DIAGNOSIS — Z992 Dependence on renal dialysis: Secondary | ICD-10-CM | POA: Diagnosis not present

## 2021-12-27 DIAGNOSIS — N186 End stage renal disease: Secondary | ICD-10-CM | POA: Diagnosis not present

## 2021-12-28 DIAGNOSIS — N186 End stage renal disease: Secondary | ICD-10-CM | POA: Diagnosis not present

## 2021-12-28 DIAGNOSIS — Z992 Dependence on renal dialysis: Secondary | ICD-10-CM | POA: Diagnosis not present

## 2021-12-29 DIAGNOSIS — Z992 Dependence on renal dialysis: Secondary | ICD-10-CM | POA: Diagnosis not present

## 2021-12-29 DIAGNOSIS — N186 End stage renal disease: Secondary | ICD-10-CM | POA: Diagnosis not present

## 2021-12-30 DIAGNOSIS — Z992 Dependence on renal dialysis: Secondary | ICD-10-CM | POA: Diagnosis not present

## 2021-12-30 DIAGNOSIS — N186 End stage renal disease: Secondary | ICD-10-CM | POA: Diagnosis not present

## 2021-12-31 DIAGNOSIS — Z992 Dependence on renal dialysis: Secondary | ICD-10-CM | POA: Diagnosis not present

## 2021-12-31 DIAGNOSIS — N186 End stage renal disease: Secondary | ICD-10-CM | POA: Diagnosis not present

## 2022-01-01 DIAGNOSIS — Z992 Dependence on renal dialysis: Secondary | ICD-10-CM | POA: Diagnosis not present

## 2022-01-01 DIAGNOSIS — N186 End stage renal disease: Secondary | ICD-10-CM | POA: Diagnosis not present

## 2022-01-02 ENCOUNTER — Encounter: Payer: Self-pay | Admitting: Family Medicine

## 2022-01-02 ENCOUNTER — Ambulatory Visit: Payer: Self-pay

## 2022-01-02 ENCOUNTER — Ambulatory Visit (INDEPENDENT_AMBULATORY_CARE_PROVIDER_SITE_OTHER): Payer: Medicare HMO | Admitting: Family Medicine

## 2022-01-02 VITALS — BP 128/84 | Ht 70.5 in | Wt 155.0 lb

## 2022-01-02 DIAGNOSIS — M79672 Pain in left foot: Secondary | ICD-10-CM

## 2022-01-02 DIAGNOSIS — N186 End stage renal disease: Secondary | ICD-10-CM | POA: Diagnosis not present

## 2022-01-02 DIAGNOSIS — M533 Sacrococcygeal disorders, not elsewhere classified: Secondary | ICD-10-CM

## 2022-01-02 DIAGNOSIS — M1610 Unilateral primary osteoarthritis, unspecified hip: Secondary | ICD-10-CM | POA: Diagnosis not present

## 2022-01-02 DIAGNOSIS — M722 Plantar fascial fibromatosis: Secondary | ICD-10-CM | POA: Diagnosis not present

## 2022-01-02 DIAGNOSIS — Z992 Dependence on renal dialysis: Secondary | ICD-10-CM | POA: Diagnosis not present

## 2022-01-02 NOTE — Assessment & Plan Note (Signed)
Improving with home exercises and naproxen.  Has pain intermittently. -Counseled on home exercise therapy and supportive care. -Referral to physical therapy. -Could consider injection.

## 2022-01-02 NOTE — Progress Notes (Signed)
  Derrick Moore - 56 y.o. male MRN [id number]  Date of birth: 1965-10-16  SUBJECTIVE:  Including CC & ROS.  No chief complaint on file.   Derrick Moore is a 56 y.o. male that is following up for his low back pain and presenting with acute left foot pain.  He has been doing well with his low back pain.  He is improved with the naproxen.  Reports to having left heel pain following a long day at work.  He has to walk a lot at work.   Review of Systems See HPI   HISTORY: Past Medical, Surgical, Social, and Family History Reviewed & Updated per EMR.   Pertinent Historical Findings include:  Past Medical History:  Diagnosis Date   Dialysis patient [facility])    Hyperlipidemia    Hypertension    Renal failure    Renal failure    Stroke [facility])     History reviewed. No pertinent surgical history.   PHYSICAL EXAM:  VS: BP 128/84 (BP Location: Left Arm, Patient Position: Sitting)   Ht 5' 10.5" (1.791 m)   Wt 155 lb (70.3 kg)   BMI 21.93 kg/m  Physical Exam Gen: NAD, alert, cooperative with exam, well-appearing MSK:  Neurovascularly intact    Limited ultrasound: Left ankle:  Normal-appearing Achilles tendon. No changes at the Achilles insertion. There is hyperemia associated with the plantar fascia but no significant thickening.  Summary: Findings most consistent with Planter fasciitis.  Ultrasound and interpretation by Clearance Coots, MD    ASSESSMENT & PLAN:   Arthropathy of pelvis Acutely occurring.  Degenerative changes appreciated of the pubic symphysis.  No family history that is contributory.  No other joints that are red or swollen.  He had a positive ANA. -Counseled on home exercise therapy and supportive care. -ANA panel and CMP.  Plantar fasciitis of left foot Acutely occurring.  Symptoms most consistent with plantar fasciitis. -Counseled on exercise therapy and supportive care. -Green sport insoles with midfoot arch strap. -Referral to physical  therapy. -Could consider injection.  Sacroiliac joint dysfunction of right side Improving with home exercises and naproxen.  Has pain intermittently. -Counseled on home exercise therapy and supportive care. -Referral to physical therapy. -Could consider injection.

## 2022-01-02 NOTE — Patient Instructions (Signed)
Good to see you Please try ice as needed for the foot  Please try the exercises  I will call with the lab results.  I have made a referral to physical therapy  Please send me a message in MyChart with any questions or updates.  Please see me back in 4 weeks or as needed if better.   --Dr. Raeford Razor

## 2022-01-02 NOTE — Assessment & Plan Note (Signed)
Acutely occurring.  Degenerative changes appreciated of the pubic symphysis.  No family history that is contributory.  No other joints that are red or swollen.  He had a positive ANA. -Counseled on home exercise therapy and supportive care. -ANA panel and CMP.

## 2022-01-02 NOTE — Assessment & Plan Note (Signed)
Acutely occurring.  Symptoms most consistent with plantar fasciitis. -Counseled on exercise therapy and supportive care. -Green sport insoles with midfoot arch strap. -Referral to physical therapy. -Could consider injection.

## 2022-01-03 DIAGNOSIS — N186 End stage renal disease: Secondary | ICD-10-CM | POA: Diagnosis not present

## 2022-01-03 DIAGNOSIS — Z992 Dependence on renal dialysis: Secondary | ICD-10-CM | POA: Diagnosis not present

## 2022-01-04 DIAGNOSIS — Z992 Dependence on renal dialysis: Secondary | ICD-10-CM | POA: Diagnosis not present

## 2022-01-04 DIAGNOSIS — N186 End stage renal disease: Secondary | ICD-10-CM | POA: Diagnosis not present

## 2022-01-05 DIAGNOSIS — Z992 Dependence on renal dialysis: Secondary | ICD-10-CM | POA: Diagnosis not present

## 2022-01-05 DIAGNOSIS — N186 End stage renal disease: Secondary | ICD-10-CM | POA: Diagnosis not present

## 2022-01-06 DIAGNOSIS — Z992 Dependence on renal dialysis: Secondary | ICD-10-CM | POA: Diagnosis not present

## 2022-01-06 DIAGNOSIS — N186 End stage renal disease: Secondary | ICD-10-CM | POA: Diagnosis not present

## 2022-01-07 DIAGNOSIS — Z992 Dependence on renal dialysis: Secondary | ICD-10-CM | POA: Diagnosis not present

## 2022-01-07 DIAGNOSIS — N186 End stage renal disease: Secondary | ICD-10-CM | POA: Diagnosis not present

## 2022-01-07 LAB — ANA,IFA RA DIAG PNL W/RFLX TIT/PATN
ANA Titer 1: NEGATIVE
Cyclic Citrullin Peptide Ab: 7 units (ref 0–19)
Rheumatoid fact SerPl-aCnc: <10 IU/mL (ref <14.0)

## 2022-01-07 LAB — COMPREHENSIVE METABOLIC PANEL
ALT: 10 IU/L (ref 0–44)
AST: 11 IU/L (ref 0–40)
Albumin/Globulin Ratio: 1.8 (ref 1.2–2.2)
Albumin: 3.9 g/dL (ref 3.8–4.9)
Alkaline Phosphatase: 182 IU/L — ABNORMAL HIGH (ref 44–121)
BUN/Creatinine Ratio: 6 — ABNORMAL LOW (ref 9–20)
BUN: 89 mg/dL (ref 6–24)
Bilirubin Total: <0.2 mg/dL (ref 0.0–1.2)
CO2: 10 mmol/L — ABNORMAL LOW (ref 20–29)
Calcium: 7.2 mg/dL — ABNORMAL LOW (ref 8.7–10.2)
Chloride: 107 mmol/L — ABNORMAL HIGH (ref 96–106)
Creatinine, Ser: 14.09 mg/dL — ABNORMAL HIGH (ref 0.76–1.27)
Globulin, Total: 2.2 g/dL (ref 1.5–4.5)
Glucose: 92 mg/dL (ref 70–99)
Potassium: 4 mmol/L (ref 3.5–5.2)
Sodium: 139 mmol/L (ref 134–144)
Total Protein: 6.1 g/dL (ref 6.0–8.5)
eGFR: 4 mL/min/{1.73_m2} — ABNORMAL LOW (ref >59)

## 2022-01-08 DIAGNOSIS — Z992 Dependence on renal dialysis: Secondary | ICD-10-CM | POA: Diagnosis not present

## 2022-01-08 DIAGNOSIS — N186 End stage renal disease: Secondary | ICD-10-CM | POA: Diagnosis not present

## 2022-01-09 ENCOUNTER — Telehealth: Payer: Self-pay | Admitting: Family Medicine

## 2022-01-09 DIAGNOSIS — Z992 Dependence on renal dialysis: Secondary | ICD-10-CM | POA: Diagnosis not present

## 2022-01-09 DIAGNOSIS — N186 End stage renal disease: Secondary | ICD-10-CM | POA: Diagnosis not present

## 2022-01-09 NOTE — Telephone Encounter (Signed)
Informed of results.   Rosemarie Ax, MD [facility] 01/09/2022, 10:55 AM

## 2022-01-10 DIAGNOSIS — Z992 Dependence on renal dialysis: Secondary | ICD-10-CM | POA: Diagnosis not present

## 2022-01-10 DIAGNOSIS — N186 End stage renal disease: Secondary | ICD-10-CM | POA: Diagnosis not present

## 2022-01-11 DIAGNOSIS — N186 End stage renal disease: Secondary | ICD-10-CM | POA: Diagnosis not present

## 2022-01-11 DIAGNOSIS — Z992 Dependence on renal dialysis: Secondary | ICD-10-CM | POA: Diagnosis not present

## 2022-01-12 DIAGNOSIS — N186 End stage renal disease: Secondary | ICD-10-CM | POA: Diagnosis not present

## 2022-01-12 DIAGNOSIS — Z992 Dependence on renal dialysis: Secondary | ICD-10-CM | POA: Diagnosis not present

## 2022-01-13 DIAGNOSIS — Z992 Dependence on renal dialysis: Secondary | ICD-10-CM | POA: Diagnosis not present

## 2022-01-13 DIAGNOSIS — N186 End stage renal disease: Secondary | ICD-10-CM | POA: Diagnosis not present

## 2022-01-14 DIAGNOSIS — Z992 Dependence on renal dialysis: Secondary | ICD-10-CM | POA: Diagnosis not present

## 2022-01-14 DIAGNOSIS — N186 End stage renal disease: Secondary | ICD-10-CM | POA: Diagnosis not present

## 2022-01-15 DIAGNOSIS — Z992 Dependence on renal dialysis: Secondary | ICD-10-CM | POA: Diagnosis not present

## 2022-01-15 DIAGNOSIS — N186 End stage renal disease: Secondary | ICD-10-CM | POA: Diagnosis not present

## 2022-01-16 DIAGNOSIS — Z992 Dependence on renal dialysis: Secondary | ICD-10-CM | POA: Diagnosis not present

## 2022-01-16 DIAGNOSIS — N186 End stage renal disease: Secondary | ICD-10-CM | POA: Diagnosis not present

## 2022-01-17 DIAGNOSIS — N186 End stage renal disease: Secondary | ICD-10-CM | POA: Diagnosis not present

## 2022-01-17 DIAGNOSIS — Z992 Dependence on renal dialysis: Secondary | ICD-10-CM | POA: Diagnosis not present

## 2022-01-18 DIAGNOSIS — Z992 Dependence on renal dialysis: Secondary | ICD-10-CM | POA: Diagnosis not present

## 2022-01-18 DIAGNOSIS — N186 End stage renal disease: Secondary | ICD-10-CM | POA: Diagnosis not present

## 2022-01-19 ENCOUNTER — Emergency Department (HOSPITAL_BASED_OUTPATIENT_CLINIC_OR_DEPARTMENT_OTHER)
Admission: EM | Admit: 2022-01-19 | Discharge: 2022-01-19 | Disposition: A | Discharge status: Home / Self Care | Payer: Medicare HMO | Attending: Emergency Medicine | Admitting: Emergency Medicine

## 2022-01-19 ENCOUNTER — Other Ambulatory Visit: Payer: Self-pay

## 2022-01-19 ENCOUNTER — Emergency Department (HOSPITAL_BASED_OUTPATIENT_CLINIC_OR_DEPARTMENT_OTHER): Payer: Medicare HMO

## 2022-01-19 ENCOUNTER — Encounter (HOSPITAL_BASED_OUTPATIENT_CLINIC_OR_DEPARTMENT_OTHER): Payer: Self-pay | Admitting: Emergency Medicine

## 2022-01-19 DIAGNOSIS — M25552 Pain in left hip: Secondary | ICD-10-CM | POA: Diagnosis not present

## 2022-01-19 DIAGNOSIS — M545 Low back pain, unspecified: Secondary | ICD-10-CM | POA: Diagnosis present

## 2022-01-19 DIAGNOSIS — M5442 Lumbago with sciatica, left side: Secondary | ICD-10-CM | POA: Diagnosis not present

## 2022-01-19 DIAGNOSIS — Z992 Dependence on renal dialysis: Secondary | ICD-10-CM | POA: Diagnosis not present

## 2022-01-19 DIAGNOSIS — M5432 Sciatica, left side: Secondary | ICD-10-CM | POA: Insufficient documentation

## 2022-01-19 DIAGNOSIS — I10 Essential (primary) hypertension: Secondary | ICD-10-CM | POA: Diagnosis not present

## 2022-01-19 DIAGNOSIS — N186 End stage renal disease: Secondary | ICD-10-CM | POA: Diagnosis not present

## 2022-01-19 MED ORDER — PREDNISONE 5 MG PO TABS: ORAL_TABLET | ORAL | 0 refills | Status: DC

## 2022-01-19 MED ORDER — LIDOCAINE 5 % EX PTCH
1.0000 | MEDICATED_PATCH | CUTANEOUS | Status: DC
  Administered 2022-01-19: 1 via TRANSDERMAL
  Filled 2022-01-19: qty 1

## 2022-01-19 MED ORDER — METHOCARBAMOL 500 MG PO TABS: 500.0000 mg | ORAL_TABLET | Freq: Three times a day (TID) | ORAL | 0 refills | Status: DC | PRN

## 2022-01-19 NOTE — ED Provider Notes (Signed)
Emergency Department Provider Note   I have reviewed the triage vital signs and the nursing notes.   HISTORY  Chief Complaint Back Pain   HPI Derrick Moore is a 56 y.o. male presents to the emergency department for evaluation of pain in the left buttock rating down the left leg.  Symptoms been ongoing for the past 3 days.  He is not having any bowel or bladder incontinence.  No urinary retention symptoms.  No groin numbness.  No fevers.  He is not an IV drug user.  No numbness into the legs.  No neck or upper extremity discomfort.  No headaches. No falls/injury.    Past Medical History:  Diagnosis Date   Dialysis patient [facility])    Hyperlipidemia    Hypertension    Renal failure    Renal failure    Stroke [facility])     Review of Systems  Constitutional: No fever/chills Eyes: No visual changes. ENT: No sore throat. Cardiovascular: Denies chest pain. Respiratory: Denies shortness of breath. Gastrointestinal: No abdominal pain.  No nausea, no vomiting.  No diarrhea.  No constipation. Genitourinary: Negative for dysuria. Musculoskeletal: Positive for back pain. Skin: Negative for rash. Neurological: Negative for headaches, focal weakness or numbness.   ____________________________________________   PHYSICAL EXAM:  VITAL SIGNS: ED Triage Vitals  Enc Vitals Group     BP 01/19/22 1801 (!) 157/110     Pulse Rate 01/19/22 1801 85     Resp 01/19/22 1801 20     Temp 01/19/22 1801 98.4 F (36.9 C)     Temp Source 01/19/22 1801 Oral     SpO2 01/19/22 1801 100 %     Weight 01/19/22 1804 145 lb (65.8 kg)     Height 01/19/22 1804 5\' 11"  (1.803 m)   Constitutional: Alert and oriented. Well appearing and in no acute distress. Eyes: Conjunctivae are normal.  Head: Atraumatic. Nose: No congestion/rhinnorhea. Mouth/Throat: Mucous membranes are moist.   Neck: No stridor.   Cardiovascular: Normal rate, regular rhythm. Good peripheral circulation. Grossly normal heart  sounds.   Respiratory: Normal respiratory effort.  No retractions. Lungs CTAB. Gastrointestinal: Soft and nontender. No distention.  Musculoskeletal: No lower extremity tenderness nor edema. No gross deformities of extremities. Neurologic:  Normal speech and language. No gross focal neurologic deficits are appreciated. 5/5 strength in the bilateral LEs. Normal sensation. 2+ patellar reflexes.  Skin:  Skin is warm, dry and intact. No rash noted. ____________________________________________  RADIOLOGY  DG Hip Unilat W or Wo Pelvis 2-3 Views Left  Result Date: 01/19/2022 CLINICAL DATA:  Left hip pain and lower back pain radiating to left leg 3 days, no reported injury EXAM: DG HIP (WITH OR WITHOUT PELVIS) 2-3V LEFT COMPARISON:  None Available. FINDINGS: There is no evidence of displaced hip fracture or dislocation. There is no evidence of arthropathy or other focal bone abnormality. Nonobstructive pattern of overlying bowel gas. Tenckhoff type dialysis catheter projects over the central pelvis. Vascular calcinosis. IMPRESSION: No displaced fracture or dislocation of the left hip pelvis seen in single frontal view. Electronically Signed   By: Delanna Ahmadi M.D.   On: 01/19/2022 19:38    ____________________________________________   PROCEDURES  Procedure(s) performed:   Procedures  None  ____________________________________________   INITIAL IMPRESSION / ASSESSMENT AND PLAN / ED COURSE  Pertinent labs & imaging results that were available during my care of the patient were reviewed by me and considered in my medical decision making (see chart for details).  This patient is Presenting for Evaluation of back pain, which does require a range of treatment options, and is a complaint that involves a high risk of morbidity and mortality.  The Differential Diagnoses includes but is not exclusive to musculoskeletal back pain, renal colic, urinary tract infection, pyelonephritis, intra-abdominal  causes of back pain, aortic aneurysm or dissection, cauda equina syndrome, sciatica, lumbar disc disease, thoracic disc disease, etc.   Critical Interventions-    Medications - No data to display   Reassessment after intervention: Pain improved.   Radiologic Tests Ordered, included hip x-ray. I independently interpreted the images and agree with radiology interpretation.   Social Determinants of Health Risk denies IVDA.  Medical Decision Making: Summary:  Patient presents emergency department for evaluation of left hip/leg pain most consistent with sciatica.  No focal deficits on exam.  No red flag signs/symptoms to strongly suspect acute spine emergency or prompt advanced spine imaging from the emergency department.  Plan for symptom management and close PCP follow-up.   Reevaluation with update and discussion with patient. Discussed imaging and follow up plan.   Considered admission but pain is well controlled and no red flag signs/symptoms to prompt further workup.   Disposition: discharge  ____________________________________________  FINAL CLINICAL IMPRESSION(S) / ED DIAGNOSES  Final diagnoses:  Sciatica of left side    Note:  This document was prepared using Dragon voice recognition software and may include unintentional dictation errors.  Nanda Quinton, MD, [facility]    Lyall Faciane, W[facility], MD 01/23/22 [phone]

## 2022-01-19 NOTE — ED Triage Notes (Signed)
Pt arrives pov, to triage in wheelchair, c/o left side lower back pain radiating to left leg x 3 days. Denies injury

## 2022-01-20 DIAGNOSIS — Z992 Dependence on renal dialysis: Secondary | ICD-10-CM | POA: Diagnosis not present

## 2022-01-20 DIAGNOSIS — N186 End stage renal disease: Secondary | ICD-10-CM | POA: Diagnosis not present

## 2022-01-21 DIAGNOSIS — N186 End stage renal disease: Secondary | ICD-10-CM | POA: Diagnosis not present

## 2022-01-21 DIAGNOSIS — Z992 Dependence on renal dialysis: Secondary | ICD-10-CM | POA: Diagnosis not present

## 2022-01-22 DIAGNOSIS — N186 End stage renal disease: Secondary | ICD-10-CM | POA: Diagnosis not present

## 2022-01-22 DIAGNOSIS — Z992 Dependence on renal dialysis: Secondary | ICD-10-CM | POA: Diagnosis not present

## 2022-01-23 DIAGNOSIS — N186 End stage renal disease: Secondary | ICD-10-CM | POA: Diagnosis not present

## 2022-01-23 DIAGNOSIS — M79672 Pain in left foot: Secondary | ICD-10-CM | POA: Diagnosis not present

## 2022-01-23 DIAGNOSIS — Z992 Dependence on renal dialysis: Secondary | ICD-10-CM | POA: Diagnosis not present

## 2022-01-23 DIAGNOSIS — M5459 Other low back pain: Secondary | ICD-10-CM | POA: Diagnosis not present

## 2022-01-23 DIAGNOSIS — M25552 Pain in left hip: Secondary | ICD-10-CM | POA: Diagnosis not present

## 2022-01-23 DIAGNOSIS — M25551 Pain in right hip: Secondary | ICD-10-CM | POA: Diagnosis not present

## 2022-01-24 DIAGNOSIS — N186 End stage renal disease: Secondary | ICD-10-CM | POA: Diagnosis not present

## 2022-01-24 DIAGNOSIS — Z992 Dependence on renal dialysis: Secondary | ICD-10-CM | POA: Diagnosis not present

## 2022-01-25 DIAGNOSIS — Z992 Dependence on renal dialysis: Secondary | ICD-10-CM | POA: Diagnosis not present

## 2022-01-25 DIAGNOSIS — N186 End stage renal disease: Secondary | ICD-10-CM | POA: Diagnosis not present

## 2022-01-26 DIAGNOSIS — Z992 Dependence on renal dialysis: Secondary | ICD-10-CM | POA: Diagnosis not present

## 2022-01-26 DIAGNOSIS — N186 End stage renal disease: Secondary | ICD-10-CM | POA: Diagnosis not present

## 2022-01-27 DIAGNOSIS — M25551 Pain in right hip: Secondary | ICD-10-CM | POA: Diagnosis not present

## 2022-01-27 DIAGNOSIS — N186 End stage renal disease: Secondary | ICD-10-CM | POA: Diagnosis not present

## 2022-01-27 DIAGNOSIS — M25552 Pain in left hip: Secondary | ICD-10-CM | POA: Diagnosis not present

## 2022-01-27 DIAGNOSIS — M5459 Other low back pain: Secondary | ICD-10-CM | POA: Diagnosis not present

## 2022-01-27 DIAGNOSIS — M79672 Pain in left foot: Secondary | ICD-10-CM | POA: Diagnosis not present

## 2022-01-27 DIAGNOSIS — Z992 Dependence on renal dialysis: Secondary | ICD-10-CM | POA: Diagnosis not present

## 2022-01-28 DIAGNOSIS — N186 End stage renal disease: Secondary | ICD-10-CM | POA: Diagnosis not present

## 2022-01-28 DIAGNOSIS — Z992 Dependence on renal dialysis: Secondary | ICD-10-CM | POA: Diagnosis not present

## 2022-01-29 DIAGNOSIS — M25551 Pain in right hip: Secondary | ICD-10-CM | POA: Diagnosis not present

## 2022-01-29 DIAGNOSIS — M79672 Pain in left foot: Secondary | ICD-10-CM | POA: Diagnosis not present

## 2022-01-29 DIAGNOSIS — M5459 Other low back pain: Secondary | ICD-10-CM | POA: Diagnosis not present

## 2022-01-29 DIAGNOSIS — N186 End stage renal disease: Secondary | ICD-10-CM | POA: Diagnosis not present

## 2022-01-29 DIAGNOSIS — Z992 Dependence on renal dialysis: Secondary | ICD-10-CM | POA: Diagnosis not present

## 2022-01-29 DIAGNOSIS — M25552 Pain in left hip: Secondary | ICD-10-CM | POA: Diagnosis not present

## 2022-01-30 DIAGNOSIS — N186 End stage renal disease: Secondary | ICD-10-CM | POA: Diagnosis not present

## 2022-01-30 DIAGNOSIS — Z992 Dependence on renal dialysis: Secondary | ICD-10-CM | POA: Diagnosis not present

## 2022-01-31 DIAGNOSIS — Z992 Dependence on renal dialysis: Secondary | ICD-10-CM | POA: Diagnosis not present

## 2022-01-31 DIAGNOSIS — N186 End stage renal disease: Secondary | ICD-10-CM | POA: Diagnosis not present

## 2022-02-01 DIAGNOSIS — Z992 Dependence on renal dialysis: Secondary | ICD-10-CM | POA: Diagnosis not present

## 2022-02-01 DIAGNOSIS — N186 End stage renal disease: Secondary | ICD-10-CM | POA: Diagnosis not present

## 2022-02-02 DIAGNOSIS — N186 End stage renal disease: Secondary | ICD-10-CM | POA: Diagnosis not present

## 2022-02-02 DIAGNOSIS — Z992 Dependence on renal dialysis: Secondary | ICD-10-CM | POA: Diagnosis not present

## 2022-02-03 DIAGNOSIS — M25551 Pain in right hip: Secondary | ICD-10-CM | POA: Diagnosis not present

## 2022-02-03 DIAGNOSIS — Z992 Dependence on renal dialysis: Secondary | ICD-10-CM | POA: Diagnosis not present

## 2022-02-03 DIAGNOSIS — M5459 Other low back pain: Secondary | ICD-10-CM | POA: Diagnosis not present

## 2022-02-03 DIAGNOSIS — M25552 Pain in left hip: Secondary | ICD-10-CM | POA: Diagnosis not present

## 2022-02-03 DIAGNOSIS — N186 End stage renal disease: Secondary | ICD-10-CM | POA: Diagnosis not present

## 2022-02-03 DIAGNOSIS — M79672 Pain in left foot: Secondary | ICD-10-CM | POA: Diagnosis not present

## 2022-02-04 DIAGNOSIS — N186 End stage renal disease: Secondary | ICD-10-CM | POA: Diagnosis not present

## 2022-02-04 DIAGNOSIS — Z992 Dependence on renal dialysis: Secondary | ICD-10-CM | POA: Diagnosis not present

## 2022-02-05 DIAGNOSIS — N186 End stage renal disease: Secondary | ICD-10-CM | POA: Diagnosis not present

## 2022-02-05 DIAGNOSIS — Z992 Dependence on renal dialysis: Secondary | ICD-10-CM | POA: Diagnosis not present

## 2022-02-06 DIAGNOSIS — M5459 Other low back pain: Secondary | ICD-10-CM | POA: Diagnosis not present

## 2022-02-06 DIAGNOSIS — M79672 Pain in left foot: Secondary | ICD-10-CM | POA: Diagnosis not present

## 2022-02-06 DIAGNOSIS — Z992 Dependence on renal dialysis: Secondary | ICD-10-CM | POA: Diagnosis not present

## 2022-02-06 DIAGNOSIS — N186 End stage renal disease: Secondary | ICD-10-CM | POA: Diagnosis not present

## 2022-02-06 DIAGNOSIS — M25552 Pain in left hip: Secondary | ICD-10-CM | POA: Diagnosis not present

## 2022-02-06 DIAGNOSIS — M25551 Pain in right hip: Secondary | ICD-10-CM | POA: Diagnosis not present

## 2022-02-07 ENCOUNTER — Telehealth: Payer: Self-pay | Admitting: Medical

## 2022-02-07 DIAGNOSIS — Z992 Dependence on renal dialysis: Secondary | ICD-10-CM | POA: Diagnosis not present

## 2022-02-07 DIAGNOSIS — N186 End stage renal disease: Secondary | ICD-10-CM | POA: Diagnosis not present

## 2022-02-07 MED ORDER — NAPROXEN 500 MG PO TABS: 500.0000 mg | ORAL_TABLET | Freq: Two times a day (BID) | ORAL | 0 refills | Status: DC

## 2022-02-07 NOTE — Telephone Encounter (Signed)
Medication: naproxen (NAPROSYN) 500 MG tablet  Has the patient contacted their pharmacy? Yes.     Preferred Pharmacy: [facility] [place], [place] - [address]   [place], [place] [address]  Phone:  [phone]  Fax:  [phone]

## 2022-02-07 NOTE — Telephone Encounter (Signed)
Rx sent 

## 2022-02-08 DIAGNOSIS — Z992 Dependence on renal dialysis: Secondary | ICD-10-CM | POA: Diagnosis not present

## 2022-02-08 DIAGNOSIS — N186 End stage renal disease: Secondary | ICD-10-CM | POA: Diagnosis not present

## 2022-02-09 DIAGNOSIS — N186 End stage renal disease: Secondary | ICD-10-CM | POA: Diagnosis not present

## 2022-02-09 DIAGNOSIS — Z992 Dependence on renal dialysis: Secondary | ICD-10-CM | POA: Diagnosis not present

## 2022-02-10 DIAGNOSIS — Z992 Dependence on renal dialysis: Secondary | ICD-10-CM | POA: Diagnosis not present

## 2022-02-10 DIAGNOSIS — N186 End stage renal disease: Secondary | ICD-10-CM | POA: Diagnosis not present

## 2022-02-11 DIAGNOSIS — Z992 Dependence on renal dialysis: Secondary | ICD-10-CM | POA: Diagnosis not present

## 2022-02-11 DIAGNOSIS — M25552 Pain in left hip: Secondary | ICD-10-CM | POA: Diagnosis not present

## 2022-02-11 DIAGNOSIS — N186 End stage renal disease: Secondary | ICD-10-CM | POA: Diagnosis not present

## 2022-02-11 DIAGNOSIS — M25551 Pain in right hip: Secondary | ICD-10-CM | POA: Diagnosis not present

## 2022-02-11 DIAGNOSIS — M79672 Pain in left foot: Secondary | ICD-10-CM | POA: Diagnosis not present

## 2022-02-11 DIAGNOSIS — M5459 Other low back pain: Secondary | ICD-10-CM | POA: Diagnosis not present

## 2022-02-12 ENCOUNTER — Emergency Department (HOSPITAL_BASED_OUTPATIENT_CLINIC_OR_DEPARTMENT_OTHER)
Admission: EM | Admit: 2022-02-12 | Discharge: 2022-02-12 | Disposition: A | Discharge status: Home / Self Care | Payer: Medicare HMO | Attending: Emergency Medicine | Admitting: Emergency Medicine

## 2022-02-12 ENCOUNTER — Encounter (HOSPITAL_BASED_OUTPATIENT_CLINIC_OR_DEPARTMENT_OTHER): Payer: Self-pay | Admitting: Emergency Medicine

## 2022-02-12 ENCOUNTER — Other Ambulatory Visit: Payer: Self-pay

## 2022-02-12 DIAGNOSIS — M545 Low back pain, unspecified: Secondary | ICD-10-CM | POA: Diagnosis present

## 2022-02-12 DIAGNOSIS — Z992 Dependence on renal dialysis: Secondary | ICD-10-CM | POA: Diagnosis not present

## 2022-02-12 DIAGNOSIS — M5459 Other low back pain: Secondary | ICD-10-CM | POA: Diagnosis not present

## 2022-02-12 DIAGNOSIS — N186 End stage renal disease: Secondary | ICD-10-CM | POA: Diagnosis not present

## 2022-02-12 MED ORDER — LIDOCAINE 5 % EX PTCH
1.0000 | MEDICATED_PATCH | CUTANEOUS | 0 refills | Status: DC
Start: 2022-02-12 — End: 2023-09-24

## 2022-02-12 MED ORDER — METHYLPREDNISOLONE 4 MG PO TBPK: ORAL_TABLET | ORAL | 0 refills | Status: DC

## 2022-02-12 NOTE — ED Provider Notes (Signed)
[place] EMERGENCY DEPARTMENT Provider Note   CSN: [id number] Arrival date & time: 02/12/22  1732     History  Chief Complaint  Patient presents with   Back Pain    Derrick Moore is a 56 y.o. male.  Patient here with acute on chronic low back pain.  He is being seen for SI joint dysfunction and doing physical therapy.  Had physical therapy yesterday and woke up a little bit more sore this morning.  No problems going to the bathroom.  Nothing makes it worse or better except for some Tylenol and naproxen.  Denies any weakness or numbness or tingling.  Denies any trauma.  The history is provided by the patient.       Home Medications Prior to Admission medications   Medication Sig Start Date End Date Taking? Authorizing Provider  methylPREDNISolone (MEDROL DOSEPAK) 4 MG TBPK tablet Follow package insert 02/12/22  Yes Miquan Tandon, DO  amLODipine (NORVASC) 5 MG tablet Take 5 mg by mouth at bedtime. 07/04/20   [provider]  clopidogrel (PLAVIX) 75 MG tablet Take 75 mg by mouth daily. 07/04/20   [provider]  hydrALAZINE (APRESOLINE) 50 MG tablet Take 50 mg by mouth 2 (two) times daily. 07/04/20   [provider]  JUBLIA 10 % SOLN Apply topically. 09/06/20   [provider]  labetalol (NORMODYNE) 200 MG tablet Take 200 mg by mouth 2 (two) times daily. 07/04/20   [provider]  LASIX 40 MG tablet Take 40 mg by mouth every morning. 07/04/20   [provider]  lidocaine (LIDODERM) 5 % Place 1 patch onto the skin daily. Remove & Discard patch within 12 hours or as directed by MD 11/21/21   Evlyn Courier, PA-C  lisinopril (ZESTRIL) 30 MG tablet Take 30 mg by mouth at bedtime. 07/04/20   [provider]  methocarbamol (ROBAXIN) 500 MG tablet Take 1 tablet (500 mg total) by mouth every 8 (eight) hours as needed for muscle spasms. 01/19/22   Long, Wonda Olds, MD  naproxen (NAPROSYN) 500 MG tablet Take 1 tablet (500 mg  total) by mouth 2 (two) times daily. 02/07/22   Saguier, Percell Miller, PA-C  pravastatin (PRAVACHOL) 10 MG tablet Take 10 mg by mouth daily. 07/04/20   [provider]  sertraline (ZOLOFT) 50 MG tablet Take 1 tablet (50 mg total) by mouth daily. 10/15/21   Saguier, Percell Miller, PA-C  sildenafil (VIAGRA) 50 MG tablet Take 1 tablet (50 mg total) by mouth daily as needed for erectile dysfunction. 09/11/20   Saguier, Percell Miller, PA-C      Allergies    Patient has no known allergies.    Review of Systems   Review of Systems  Physical Exam Updated Vital Signs BP (!) 128/93 (BP Location: Right Arm)   Pulse 97   Temp 98.6 F (37 C) (Oral)   Resp 18   Ht 5\' 11"  (1.803 m)   Wt 68 kg   SpO2 95%   BMI 20.92 kg/m  Physical Exam Vitals and nursing note reviewed.  Constitutional:      General: He is not in acute distress.    Appearance: He is well-developed.  Cardiovascular:     Rate and Rhythm: Normal rate and regular rhythm.     Pulses: Normal pulses.     Heart sounds: Normal heart sounds. No murmur heard. Pulmonary:     Effort: Pulmonary effort is normal. No respiratory distress.     Breath sounds: Normal breath  sounds.  Musculoskeletal:        General: Tenderness present. No swelling.     Comments: Tenderness to the lumbar muscles bilaterally  Skin:    General: Skin is warm and dry.     Capillary Refill: Capillary refill takes less than 2 seconds.  Neurological:     General: No focal deficit present.     Mental Status: He is alert and oriented to person, place, and time.     Cranial Nerves: No cranial nerve deficit.     Sensory: No sensory deficit.     Motor: No weakness.     Comments: 5+ out of 5 strength throughout, normal sensation  Psychiatric:        Mood and Affect: Mood normal.     ED Results / Procedures / Treatments   Labs (all labs ordered are listed, but only abnormal results are displayed) Labs Reviewed - No data to display  EKG None  Radiology No results  found.  Procedures Procedures    Medications Ordered in ED Medications - No data to display  ED Course/ Medical Decision Making/ A&P                           Medical Decision Making Risk Prescription drug management.   Derrick Moore is here with low back pain.  Acute on chronic process.  Has SI joint dysfunction.  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  Was unable to go to work.  Denies any weakness or numbness.  Does not have any cauda equina symptoms.  Neurologically neuromuscularly intact.  He is tender to the paraspinal lumbar muscles bilaterally.  Overall suspect inflammatory process.  Will prescribe Medrol Dosepak.  Recommend continued use of Tylenol and follow-up with sports medicine.  Discharged in good condition.  This chart was dictated using voice recognition software.  Despite best efforts to proofread,  errors can occur which can change the documentation meaning.         Final Clinical Impression(s) / ED Diagnoses Final diagnoses:  Acute low back pain, unspecified back pain laterality, unspecified whether sciatica present    Rx / DC Orders ED Discharge Orders          Ordered    methylPREDNISolone (MEDROL DOSEPAK) 4 MG TBPK tablet        02/12/22 1759              Lennice Sites, DO 02/12/22 1802

## 2022-02-12 NOTE — ED Triage Notes (Addendum)
Pt reports back and hip pain, chronic, but worse today; had PT yesterday; took tylenol and ibuprofen at 12 noon

## 2022-02-12 NOTE — Discharge Instructions (Addendum)
Take 1000 mg of Tylenol every 6 hours as needed for pain.  Take Medrol Dosepak as prescribed.

## 2022-02-13 DIAGNOSIS — Z992 Dependence on renal dialysis: Secondary | ICD-10-CM | POA: Diagnosis not present

## 2022-02-13 DIAGNOSIS — N186 End stage renal disease: Secondary | ICD-10-CM | POA: Diagnosis not present

## 2022-02-14 DIAGNOSIS — Z992 Dependence on renal dialysis: Secondary | ICD-10-CM | POA: Diagnosis not present

## 2022-02-14 DIAGNOSIS — N186 End stage renal disease: Secondary | ICD-10-CM | POA: Diagnosis not present

## 2022-02-15 DIAGNOSIS — Z992 Dependence on renal dialysis: Secondary | ICD-10-CM | POA: Diagnosis not present

## 2022-02-15 DIAGNOSIS — N186 End stage renal disease: Secondary | ICD-10-CM | POA: Diagnosis not present

## 2022-02-16 DIAGNOSIS — N186 End stage renal disease: Secondary | ICD-10-CM | POA: Diagnosis not present

## 2022-02-16 DIAGNOSIS — Z992 Dependence on renal dialysis: Secondary | ICD-10-CM | POA: Diagnosis not present

## 2022-02-17 DIAGNOSIS — N186 End stage renal disease: Secondary | ICD-10-CM | POA: Diagnosis not present

## 2022-02-17 DIAGNOSIS — Z992 Dependence on renal dialysis: Secondary | ICD-10-CM | POA: Diagnosis not present

## 2022-02-18 DIAGNOSIS — N186 End stage renal disease: Secondary | ICD-10-CM | POA: Diagnosis not present

## 2022-02-18 DIAGNOSIS — Z992 Dependence on renal dialysis: Secondary | ICD-10-CM | POA: Diagnosis not present

## 2022-02-19 DIAGNOSIS — Z992 Dependence on renal dialysis: Secondary | ICD-10-CM | POA: Diagnosis not present

## 2022-02-19 DIAGNOSIS — N186 End stage renal disease: Secondary | ICD-10-CM | POA: Diagnosis not present

## 2022-02-20 ENCOUNTER — Emergency Department (EMERGENCY_DEPARTMENT_HOSPITAL)
Admission: EM | Admit: 2022-02-20 | Discharge: 2022-02-20 | Discharge status: Against Medical Advice | Payer: Medicare HMO | Source: Home / Self Care | Attending: Emergency Medicine | Admitting: Emergency Medicine

## 2022-02-20 ENCOUNTER — Encounter (HOSPITAL_BASED_OUTPATIENT_CLINIC_OR_DEPARTMENT_OTHER): Payer: Self-pay | Admitting: Emergency Medicine

## 2022-02-20 ENCOUNTER — Emergency Department (HOSPITAL_BASED_OUTPATIENT_CLINIC_OR_DEPARTMENT_OTHER): Payer: Medicare HMO

## 2022-02-20 ENCOUNTER — Other Ambulatory Visit: Payer: Self-pay

## 2022-02-20 DIAGNOSIS — D649 Anemia, unspecified: Secondary | ICD-10-CM | POA: Diagnosis not present

## 2022-02-20 DIAGNOSIS — R062 Wheezing: Secondary | ICD-10-CM | POA: Insufficient documentation

## 2022-02-20 DIAGNOSIS — Z992 Dependence on renal dialysis: Secondary | ICD-10-CM | POA: Insufficient documentation

## 2022-02-20 DIAGNOSIS — N19 Unspecified kidney failure: Secondary | ICD-10-CM

## 2022-02-20 DIAGNOSIS — M542 Cervicalgia: Secondary | ICD-10-CM | POA: Insufficient documentation

## 2022-02-20 DIAGNOSIS — E875 Hyperkalemia: Secondary | ICD-10-CM | POA: Insufficient documentation

## 2022-02-20 DIAGNOSIS — D631 Anemia in chronic kidney disease: Secondary | ICD-10-CM

## 2022-02-20 DIAGNOSIS — R0602 Shortness of breath: Secondary | ICD-10-CM | POA: Diagnosis not present

## 2022-02-20 DIAGNOSIS — N189 Chronic kidney disease, unspecified: Secondary | ICD-10-CM | POA: Insufficient documentation

## 2022-02-20 DIAGNOSIS — N186 End stage renal disease: Secondary | ICD-10-CM | POA: Diagnosis not present

## 2022-02-20 DIAGNOSIS — R600 Localized edema: Secondary | ICD-10-CM | POA: Insufficient documentation

## 2022-02-20 DIAGNOSIS — I129 Hypertensive chronic kidney disease with stage 1 through stage 4 chronic kidney disease, or unspecified chronic kidney disease: Secondary | ICD-10-CM | POA: Insufficient documentation

## 2022-02-20 DIAGNOSIS — R609 Edema, unspecified: Secondary | ICD-10-CM

## 2022-02-20 DIAGNOSIS — Z79899 Other long term (current) drug therapy: Secondary | ICD-10-CM | POA: Insufficient documentation

## 2022-02-20 DIAGNOSIS — R392 Extrarenal uremia: Secondary | ICD-10-CM | POA: Insufficient documentation

## 2022-02-20 DIAGNOSIS — I120 Hypertensive chronic kidney disease with stage 5 chronic kidney disease or end stage renal disease: Secondary | ICD-10-CM | POA: Diagnosis not present

## 2022-02-20 DIAGNOSIS — R9431 Abnormal electrocardiogram [ECG] [EKG]: Secondary | ICD-10-CM | POA: Diagnosis not present

## 2022-02-20 DIAGNOSIS — M25562 Pain in left knee: Secondary | ICD-10-CM | POA: Diagnosis not present

## 2022-02-20 LAB — BASIC METABOLIC PANEL
Anion gap: 13 (ref 5–15)
Anion gap: 13 (ref 5–15)
BUN: 121 mg/dL — ABNORMAL HIGH (ref 6–20)
BUN: 124 mg/dL — ABNORMAL HIGH (ref 6–20)
CO2: 12 mmol/L — ABNORMAL LOW (ref 22–32)
CO2: 12 mmol/L — ABNORMAL LOW (ref 22–32)
Calcium: 6.4 mg/dL — CL (ref 8.9–10.3)
Calcium: 6.9 mg/dL — ABNORMAL LOW (ref 8.9–10.3)
Chloride: 111 mmol/L (ref 98–111)
Chloride: 111 mmol/L (ref 98–111)
Creatinine, Ser: 18.06 mg/dL — ABNORMAL HIGH (ref 0.61–1.24)
Creatinine, Ser: 18.08 mg/dL — ABNORMAL HIGH (ref 0.61–1.24)
GFR, Estimated: 3 mL/min — ABNORMAL LOW (ref >60)
GFR, Estimated: 3 mL/min — ABNORMAL LOW (ref >60)
Glucose, Bld: 84 mg/dL (ref 70–99)
Glucose, Bld: 70 mg/dL (ref 70–99)
Potassium: 6.1 mmol/L — ABNORMAL HIGH (ref 3.5–5.1)
Potassium: 5.1 mmol/L (ref 3.5–5.1)
Sodium: 136 mmol/L (ref 135–145)
Sodium: 136 mmol/L (ref 135–145)

## 2022-02-20 MED ORDER — CALCIUM GLUCONATE-NACL 1-0.675 GM/50ML-% IV SOLN
1.0000 g | Freq: Once | INTRAVENOUS | Status: AC
  Administered 2022-02-20: 1000 mg via INTRAVENOUS
  Filled 2022-02-20: qty 50

## 2022-02-20 MED ORDER — CALCIUM GLUCONATE-NACL 1-0.675 GM/50ML-% IV SOLN
INTRAVENOUS | Status: AC
  Filled 2022-02-20: qty 50

## 2022-02-20 MED ORDER — SODIUM ZIRCONIUM CYCLOSILICATE 5 G PO PACK
5.0000 g | PACK | Freq: Once | ORAL | Status: AC
Start: 2022-02-20 — End: 2022-02-20
  Administered 2022-02-20: 5 g via ORAL
  Filled 2022-02-20: qty 1

## 2022-02-20 MED ORDER — SODIUM CHLORIDE 0.9 % IV SOLN: 1.0000 g | Freq: Once | INTRAVENOUS | Status: DC

## 2022-02-20 NOTE — ED Triage Notes (Addendum)
Pt c/o hip pain x1-2 months.  Reports worsening pain radiating to left knee, worse with movement. Denies any injury.   Peritoneal Dialysis pt, reports new leg swelling since yesterday. Reports increased output via home dialysis yesterday, no improvement in swelling.  Denies CP, denies ShOB. No fevers, no sick contacts.

## 2022-02-20 NOTE — ED Provider Notes (Signed)
[facility] EMERGENCY DEPARTMENT Provider Note   CSN: [id number] Arrival date & time: 02/20/22  1305     History  Chief Complaint  Patient presents with   Leg Swelling   Knee Pain    Derrick Moore is a 56 y.o. male.   Knee Pain  56 year old male presents emergency department with complaints of left-sided neck pain.  Patient states that symptoms began when he was at work standing.  The patient denies any known mechanism of injury to affected knee.  Denies feelings of instability, swelling, redness denies known history of gout or osteoarthritis.  Patient was taking some prednisone and is described from prior emergency department as well asprescribed pain medication with some relief of symptoms.  He is also taken Tylenol at home intermittently which has helped as well.  He presents emergency department because his pain has not completely subsided. He also notes increase in lower extremity swelling for the last day without associated shortness of breath or chest pain. He does note he's had increased output during dialysis session yesterday 500-700cc, Denies back pain, saddle anesthesia, weakness/numbness in lower extremitites, IV drug use hx, fever, chills, night sweats.  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   Home Medications Prior to Admission medications   Medication Sig Start Date End Date Taking? Authorizing Provider  amLODipine (NORVASC) 5 MG tablet Take 5 mg by mouth at bedtime. 07/04/20   [provider]  clopidogrel (PLAVIX) 75 MG tablet Take 75 mg by mouth daily. 07/04/20   [provider]  hydrALAZINE (APRESOLINE) 50 MG tablet Take 50 mg by mouth 2 (two) times daily. 07/04/20   [provider]  JUBLIA 10 % SOLN Apply topically. 09/06/20   [provider]  labetalol (NORMODYNE) 200 MG tablet Take 200 mg by mouth 2 (two) times daily. 07/04/20   [provider]  LASIX 40 MG tablet Take 40 mg  by mouth every morning. 07/04/20   [provider]  lidocaine (LIDODERM) 5 % Place 1 patch onto the skin daily. Remove & Discard patch within 12 hours or as directed by MD 02/12/22   Lennice Sites, DO  lisinopril (ZESTRIL) 30 MG tablet Take 30 mg by mouth at bedtime. 07/04/20   [provider]  methocarbamol (ROBAXIN) 500 MG tablet Take 1 tablet (500 mg total) by mouth every 8 (eight) hours as needed for muscle spasms. 01/19/22   Long, Wonda Olds, MD  methylPREDNISolone (MEDROL DOSEPAK) 4 MG TBPK tablet Follow package insert 02/12/22   Curatolo, Adam, DO  naproxen (NAPROSYN) 500 MG tablet Take 1 tablet (500 mg total) by mouth 2 (two) times daily. 02/07/22   Saguier, Percell Miller, PA-C  pravastatin (PRAVACHOL) 10 MG tablet Take 10 mg by mouth daily. 07/04/20   [provider]  sertraline (ZOLOFT) 50 MG tablet Take 1 tablet (50 mg total) by mouth daily. 10/15/21   Saguier, Percell Miller, PA-C  sildenafil (VIAGRA) 50 MG tablet Take 1 tablet (50 mg total) by mouth daily as needed for erectile dysfunction. 09/11/20   Saguier, Percell Miller, PA-C      Allergies    Patient has no known allergies.    Review of Systems   Review of Systems  All other systems reviewed and are negative.   Physical Exam Updated Vital Signs BP (!) 157/108   Pulse 79   Temp 98.2 F (36.8 C) (Oral)   Resp 17   Ht 5\' 10"  (1.778 m)   Wt 68 kg  SpO2 97%   BMI 21.52 kg/m  Physical Exam Vitals and nursing note reviewed.  Constitutional:      General: He is not in acute distress.    Appearance: He is well-developed.  HENT:     Head: Normocephalic and atraumatic.  Eyes:     Conjunctiva/sclera: Conjunctivae normal.  Cardiovascular:     Rate and Rhythm: Normal rate and regular rhythm.     Heart sounds: No murmur heard. Pulmonary:     Effort: Pulmonary effort is normal. No respiratory distress.     Breath sounds: Wheezing present.     Comments: Bilateral wheeze throughout lung fields.  Abdominal:     Palpations:  Abdomen is soft.     Tenderness: There is no abdominal tenderness.  Musculoskeletal:        General: No swelling.     Cervical back: Neck supple.     Right lower leg: Edema present.     Left lower leg: Edema present.     Comments: 2-3+ lower extremity pitting edema in bilateral lower extremities. No TTP of medial or lateral joint lines with minimal swelling noted in popliteal fossa. No overlying skin abnormalities noted.  Muscle strength 5/5 for hip flexion/extension, knee flexion/extension bilaterally. Posterior tibial pulses full and equal bilaterally.   Skin:    General: Skin is warm and dry.     Capillary Refill: Capillary refill takes less than 2 seconds.  Neurological:     Mental Status: He is alert.  Psychiatric:        Mood and Affect: Mood normal.     ED Results / Procedures / Treatments   Labs (all labs ordered are listed, but only abnormal results are displayed) Labs Reviewed  CBC WITH DIFFERENTIAL/PLATELET - Abnormal; Notable for the following components:      Result Value   RBC 2.13 (*)    Hemoglobin 6.4 (*)    HCT 20.2 (*)    RDW 15.9 (*)    All other components within normal limits  BASIC METABOLIC PANEL - Abnormal; Notable for the following components:   Potassium 6.1 (*)    CO2 12 (*)    BUN 121 (*)    Creatinine, Ser 18.06 (*)    Calcium 6.4 (*)    GFR, Estimated 3 (*)    All other components within normal limits  BASIC METABOLIC PANEL - Abnormal; Notable for the following components:   CO2 12 (*)    BUN 124 (*)    Creatinine, Ser 18.08 (*)    Calcium 6.9 (*)    GFR, Estimated 3 (*)    All other components within normal limits    EKG EKG Interpretation  Date/Time:  Thursday February 20 2022 17:40:15 EDT Ventricular Rate:  77 PR Interval:  134 QRS Duration: 92 QT Interval:  386 QTC Calculation: 437 R Axis:   86 Text Interpretation: Sinus or ectopic atrial rhythm Borderline T wave abnormalities No significant change since last tracing Confirmed by  Deno Etienne [phone]) on 02/20/2022 5:53:23 PM  Radiology DG Chest 2 View  Result Date: 02/20/2022 CLINICAL DATA:  Shortness of breath. EXAM: CHEST - 2 VIEW COMPARISON:  None Available. FINDINGS: Mild cardiomegaly and ectasia of the thoracic aorta are seen. Aortic atherosclerotic calcification incidentally noted. Both lungs are clear. IMPRESSION: Mild cardiomegaly. No active lung disease. Electronically Signed   By: Marlaine Hind M.D.   On: 02/20/2022 16:35   DG Knee Complete 4 Views Left  Result Date: 02/20/2022 CLINICAL DATA:  Worsening  left knee pain.  No known injury. EXAM: LEFT KNEE - COMPLETE 4+ VIEW COMPARISON:  None Available. FINDINGS: No evidence of fracture, dislocation, or joint effusion. No evidence of arthropathy or other focal bone abnormality. Extensive peripheral vascular calcification noted. IMPRESSION: No acute findings. Extensive peripheral vascular calcification. Electronically Signed   By: Marlaine Hind M.D.   On: 02/20/2022 16:34    Procedures .Critical Care  Performed by: Wilnette Kales, PA Authorized by: Wilnette Kales, PA   Critical care provider statement:    Critical care time (minutes):  45   Critical care was necessary to treat or prevent imminent or life-threatening deterioration of the following conditions:  Renal failure   Critical care was time spent personally by me on the following activities:  Development of treatment plan with patient or surrogate, discussions with consultants, evaluation of patient's response to treatment, examination of patient, pulse oximetry, ordering and review of radiographic studies, ordering and review of laboratory studies, ordering and performing treatments and interventions, re-evaluation of patient's condition and review of old charts   I assumed direction of critical care for this patient from another provider in my specialty: no     Care discussed with: admitting provider       Medications Ordered in ED Medications   sodium zirconium cyclosilicate (LOKELMA) packet 5 g (5 g Oral Given 02/20/22 1940)  calcium gluconate 1 g/ 50 mL sodium chloride IVPB (0 mg Intravenous Stopped 02/20/22 2053)    ED Course/ Medical Decision Making/ A&P Clinical Course as of 02/20/22 2356  Thu Feb 20, 2022  1850 Dr. Kieth Brightly of nephrology. She agreed with admission to [facility] through hospital medicine and they would see the patient.  [CR]  1859 Consulted Dr. Danford Bad of hospital medicine. She agreed with admission. She advised re-evaluate cbc/bmp after calcium and lokelma given to ensure no urgent need for dialysis.  [CR]    Clinical Course User Index [CR] Wilnette Kales, PA                           Medical Decision Making Amount and/or Complexity of Data Reviewed Labs: ordered. Radiology: ordered.  Risk Prescription drug management.   This patient presents to the ED for concern of Left knee pain, this involves an extensive number of treatment options, and is a complaint that carries with it a high risk of complications and morbidity.  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  The pertinent results include: Bicarb of 12, BUN of 124, creatinine of 18.08, GFR of 3, calcium of 6.4 initially, potassium of 6.1 initially, hemoglobin of 6.4.  Patient has baseline anemia with hemoglobin around 9-10.   Imaging Studies ordered:  I ordered imaging studies including L knee x-ray, CXR Chest x-ray: Mild cardiomegaly.  No acute abnormalities Left knee x-ray no acute abnormalities I independently visualized and  interpreted imaging  I agree with the radiologist interpretation   Cardiac Monitoring: / EKG:  The patient was maintained on a cardiac monitor.  I personally viewed and interpreted the cardiac monitored which showed an underlying rhythm of: Sinus rhythm   Consultations Obtained:  See  ED course  Problem List / ED Course / Critical interventions / Medication management  Hyperkalemia, hypocalcemia I ordered medication including Lokelma and calcium gluconate   Reevaluation of the patient after these medicines showed that the patient improved I have reviewed the patients home medicines and have made adjustments as needed   Social Determinants of Health:  Current tobacco and cigarette use. Denies illicit drug use.    Test / Admission - Considered:  Failed peritoneal dialysis, anemia, hyperkalemia, hypocalcemia Vitals signs significant for hypertension with a blood pressure 157/108. Otherwise within normal range and stable throughout visit. Laboratory/imaging studies significant for: Severe electrolyte abnormalities as well as acute on chronic anemia.  Proper consultations were made for hospitalization the patient.  As patient waited for transfer, his mind changed regarding admission to the hospital.  I discussed at length with patient regarding increased likelihood of mortality if he left AGAINST MEDICAL ADVICE.  He still elected to leave after walking through laboratory results in detail.  He was deemed to have capacity.  Attending physician Dr. Deno Etienne also discussed with patient regarding significant increase in mortality if he chose to leave [place] but patient still elected to leave.  He said he had to walk his apartment and would return tomorrow to the emergency department.        Final Clinical Impression(s) / ED Diagnoses Final diagnoses:  Hyperkalemia  Uremia  Peripheral edema  Anemia due to chronic kidney disease, on chronic dialysis [facility])   Hypocalcemia    Rx / DC Orders ED Discharge Orders     None         Wilnette Kales, [place] 02/20/22 [place], [place], DO 02/21/22 1810

## 2022-02-20 NOTE — Progress Notes (Signed)
Plan of Care Note for accepted transfer   Patient: Derrick Moore MRN: [id number]   [place]: 02/20/2022  Facility requesting transfer: [facility] Requesting Provider: Dion Saucier, [place] Reason for transfer: Hyperkalemia, acute on chronic anemia Facility course:  56 yo M with ESRD on PD, HTN presents to [facility] with reports of atraumatic left knee pain. Knee x-ray is negative but lab work was revealing for hyperkalemia of 6.1 with a creatinine of 18 and BUN of 121.  Acute on chronic anemia down to 6.4 from remote prior of 9.8. Unclear due to CKD or other source.   EKG showing peaked T-waves. IV calcium gluconate and Lokelma was ordered. Nephrology was consulted by ED PA and will see in consult in the morning.  I have requested IV calcium and Lokelma to be given with repeat K before transfer to verify improvement and that pt does not require urgent dialysis overnight.   Repeat K now returning at 5.1. Pt accepted for transfer to med-tele bed.     Plan of care: The patient is accepted for admission to Telemetry unit, at [facility]..  ED provider to continue care of pt while he remains in ED.   Author: Orene Desanctis, DO 02/20/2022  Check [email or web address] for on-call coverage.  Nursing staff, Please call [place] number on Amion as soon as patient's arrival, so appropriate admitting provider can evaluate the pt.

## 2022-02-20 NOTE — ED Notes (Signed)
Risks of leaving and benefits of staying discussed with pt; pt reports he continues to want to leave and will return tomorrow for further treatment; pt taken to vehicle via wheelchair

## 2022-02-20 NOTE — ED Notes (Signed)
Left knee swollen started this morning. Able to bear wt . Ankle is also swollen left leg

## 2022-02-20 NOTE — ED Notes (Signed)
ED TO INPATIENT HANDOFF REPORT  ED Nurse Name and Phone #:   S Name/Age/Gender Etter Sjogren Brayman 56 y.o. male Room/Bed: MH12/MH12  Code Status   Code Status: Not on file  Home/SNF/Other Home Patient oriented to: self, place, time, and situation Is this baseline? Yes   Triage Complete: Triage complete  Chief Complaint Anemia [D64.9]  Triage Note Pt c/o hip pain x1-2 months.  Reports worsening pain radiating to left knee, worse with movement. Denies any injury.   Peritoneal Dialysis pt, reports new leg swelling since yesterday. Reports increased output via home dialysis yesterday, no improvement in swelling.  Denies CP, denies ShOB. No fevers, no sick contacts.     Allergies No Known Allergies  Level of Care/Admitting Diagnosis ED Disposition     ED Disposition  Admit   Condition  --   Comment  Hospital Area: [place] [[id number]]  Level of Care: Telemetry Medical [104]  Interfacility transfer: Yes  May place patient in observation at [facility] or [place] if equivalent level of care is available:: No  Covid Evaluation: Asymptomatic - no recent exposure (last 10 days) testing not required  Diagnosis: Anemia [433295]  Admitting Physician: Orene Desanctis [[id number]]  Attending Physician: Orene Desanctis [[id number]]          B Medical/Surgery History Past Medical History:  Diagnosis Date   Dialysis patient ([place])    Hyperlipidemia    Hypertension    Renal failure    Renal failure    Stroke ([place])    No past surgical history on file.   A IV Location/Drains/Wounds Patient Lines/Drains/Airways Status     Active Line/Drains/Airways     Name Placement date Placement time Site Days   Peripheral IV 02/20/22 20 G Right Antecubital 02/20/22  1900  Antecubital  less than 1            Intake/Output Last 24 hours No intake or output data in the 24 hours ending 02/20/22 2229  Labs/Imaging Results for orders placed or performed during the  hospital encounter of 02/20/22 (from the past 48 hour(s))  CBC with Differential     Status: Abnormal   Collection Time: 02/20/22  3:45 PM  Result Value Ref Range   WBC 6.5 4.0 - 10.5 K/uL   RBC 2.13 (L) 4.22 - 5.81 MIL/uL   Hemoglobin 6.4 (LL) 13.0 - 17.0 g/dL    Comment: This critical result has verified and been called to [facility] by Alfredia Ferguson on 07 27 2023 at 1734, and has been read back.    HCT 20.2 (L) 39.0 - 52.0 %   MCV 94.8 80.0 - 100.0 fL   MCH 30.0 26.0 - 34.0 pg   MCHC 31.7 30.0 - 36.0 g/dL   RDW 15.9 (H) 11.5 - 15.5 %   Platelets 192 150 - 400 K/uL   nRBC 0.0 0.0 - 0.2 %   Neutrophils Relative % 70 %   Neutro Abs 4.6 1.7 - 7.7 K/uL   Lymphocytes Relative 16 %   Lymphs Abs 1.1 0.7 - 4.0 K/uL   Monocytes Relative 9 %   Monocytes Absolute 0.6 0.1 - 1.0 K/uL   Eosinophils Relative 3 %   Eosinophils Absolute 0.2 0.0 - 0.5 K/uL   Basophils Relative 1 %   Basophils Absolute 0.0 0.0 - 0.1 K/uL   Immature Granulocytes 1 %   Abs Immature Granulocytes 0.06 0.00 - 0.07 K/uL    Comment: Performed at [facility]  [address], [place] [place], [place] [address]  Basic metabolic panel     Status: Abnormal   Collection Time: 02/20/22  3:45 PM  Result Value Ref Range   Sodium 136 135 - 145 mmol/L   Potassium 6.1 (H) 3.5 - 5.1 mmol/L   Chloride 111 98 - 111 mmol/L   CO2 12 (L) 22 - 32 mmol/L   Glucose, Bld 84 70 - 99 mg/dL    Comment: Glucose reference range applies only to samples taken after fasting for at least 8 hours.   BUN 121 (H) 6 - 20 mg/dL    Comment: RESULT CONFIRMED BY MANUAL DILUTION HS   Creatinine, Ser 18.06 (H) 0.61 - 1.24 mg/dL   Calcium 6.4 (LL) 8.9 - 10.3 mg/dL    Comment: CRITICAL RESULT CALLED TO, READ BACK BY AND VERIFIED WITH MARVA SIMMS 02/20/22 AT 1814 HS   GFR, Estimated 3 (L) >60 mL/min    Comment: (NOTE) Calculated using the CKD-EPI Creatinine Equation (2021)    Anion gap 13 5 - 15    Comment: Performed at [facility],  [address] [place], [place] [address]  Basic metabolic panel     Status: Abnormal   Collection Time: 02/20/22  9:06 PM  Result Value Ref Range   Sodium 136 135 - 145 mmol/L   Potassium 5.1 3.5 - 5.1 mmol/L   Chloride 111 98 - 111 mmol/L   CO2 12 (L) 22 - 32 mmol/L   Glucose, Bld 70 70 - 99 mg/dL    Comment: Glucose reference range applies only to samples taken after fasting for at least 8 hours.   BUN 124 (H) 6 - 20 mg/dL    Comment: RESULT CONFIRMED BY MANUAL DILUTION   Creatinine, Ser 18.08 (H) 0.61 - 1.24 mg/dL   Calcium 6.9 (L) 8.9 - 10.3 mg/dL   GFR, Estimated 3 (L) >60 mL/min    Comment: (NOTE) Calculated using the CKD-EPI Creatinine Equation (2021)    Anion gap 13 5 - 15    Comment: Performed at [facility], [address] [place], [place] [address]   DG Chest 2 View  Result Date: 02/20/2022 CLINICAL DATA:  Shortness of breath. EXAM: CHEST - 2 VIEW COMPARISON:  None Available. FINDINGS: Mild cardiomegaly and ectasia of the thoracic aorta are seen. Aortic atherosclerotic calcification incidentally noted. Both lungs are clear. IMPRESSION: Mild cardiomegaly. No active lung disease. Electronically Signed   By: Marlaine Hind M.D.   On: 02/20/2022 16:35   DG Knee Complete 4 Views Left  Result Date: 02/20/2022 CLINICAL DATA:  Worsening left knee pain.  No known injury. EXAM: LEFT KNEE - COMPLETE 4+ VIEW COMPARISON:  None Available. FINDINGS: No evidence of fracture, dislocation, or joint effusion. No evidence of arthropathy or other focal bone abnormality. Extensive peripheral vascular calcification noted. IMPRESSION: No acute findings. Extensive peripheral vascular calcification. Electronically Signed   By: Marlaine Hind M.D.   On: 02/20/2022 16:34    Pending Labs Unresulted Labs (From admission, onward)    None       Vitals/Pain Today's Vitals   02/20/22 2130 02/20/22 2145 02/20/22 2200 02/20/22 2215  BP: (!) 157/109 (!) 158/103 (!) 163/103 (!) 155/110   Pulse:      Resp: 15 19 (!) 21 15  Temp:      TempSrc:      SpO2:      Weight:      Height:  PainSc:        Isolation Precautions No active isolations  Medications Medications  sodium zirconium cyclosilicate (LOKELMA) packet 5 g (5 g Oral Given 02/20/22 1940)  calcium gluconate 1 g/ 50 mL sodium chloride IVPB (0 mg Intravenous Stopped 02/20/22 2053)    Mobility walks with person assist Low fall risk   Focused Assessments    R Recommendations: See Admitting Provider Note  Report given to:   Additional Notes:

## 2022-02-20 NOTE — ED Notes (Signed)
EDP at bedside, pt requesting to leave AMA

## 2022-02-20 NOTE — ED Notes (Signed)
Pt transported to xray 

## 2022-02-21 DIAGNOSIS — Z992 Dependence on renal dialysis: Secondary | ICD-10-CM | POA: Diagnosis not present

## 2022-02-21 DIAGNOSIS — N186 End stage renal disease: Secondary | ICD-10-CM | POA: Diagnosis not present

## 2022-02-21 LAB — CBC WITH DIFFERENTIAL/PLATELET
Abs Immature Granulocytes: 0.06 10*3/uL (ref 0.00–0.07)
Basophils Absolute: 0 10*3/uL (ref 0.0–0.1)
Basophils Relative: 1 %
Eosinophils Absolute: 0.2 10*3/uL (ref 0.0–0.5)
Eosinophils Relative: 3 %
HCT: 20.2 % — ABNORMAL LOW (ref 39.0–52.0)
Hemoglobin: 6.4 g/dL — CL (ref 13.0–17.0)
Immature Granulocytes: 1 %
Lymphocytes Relative: 16 %
Lymphs Abs: 1.1 10*3/uL (ref 0.7–4.0)
MCH: 30 pg (ref 26.0–34.0)
MCHC: 31.7 g/dL (ref 30.0–36.0)
MCV: 94.8 fL (ref 80.0–100.0)
Monocytes Absolute: 0.6 10*3/uL (ref 0.1–1.0)
Monocytes Relative: 9 %
Neutro Abs: 4.6 10*3/uL (ref 1.7–7.7)
Neutrophils Relative %: 70 %
Platelets: 192 10*3/uL (ref 150–400)
RBC: 2.13 MIL/uL — ABNORMAL LOW (ref 4.22–5.81)
RDW: 15.9 % — ABNORMAL HIGH (ref 11.5–15.5)
WBC: 6.5 10*3/uL (ref 4.0–10.5)
nRBC: 0 % (ref 0.0–0.2)

## 2022-02-22 ENCOUNTER — Other Ambulatory Visit: Payer: Self-pay

## 2022-02-22 ENCOUNTER — Inpatient Hospital Stay (HOSPITAL_COMMUNITY): Payer: Medicare HMO

## 2022-02-22 ENCOUNTER — Inpatient Hospital Stay (HOSPITAL_COMMUNITY)
Admission: EM | Admit: 2022-02-22 | Discharge: 2022-02-24 | DRG: 682 | Disposition: A | Discharge status: Home / Self Care | Payer: Medicare HMO | Attending: Internal Medicine | Admitting: Internal Medicine

## 2022-02-22 DIAGNOSIS — R600 Localized edema: Secondary | ICD-10-CM | POA: Diagnosis not present

## 2022-02-22 DIAGNOSIS — R531 Weakness: Secondary | ICD-10-CM

## 2022-02-22 DIAGNOSIS — E878 Other disorders of electrolyte and fluid balance, not elsewhere classified: Secondary | ICD-10-CM | POA: Diagnosis not present

## 2022-02-22 DIAGNOSIS — M25562 Pain in left knee: Secondary | ICD-10-CM | POA: Diagnosis not present

## 2022-02-22 DIAGNOSIS — F32A Depression, unspecified: Secondary | ICD-10-CM | POA: Diagnosis present

## 2022-02-22 DIAGNOSIS — E876 Hypokalemia: Secondary | ICD-10-CM | POA: Diagnosis not present

## 2022-02-22 DIAGNOSIS — Z8673 Personal history of transient ischemic attack (TIA), and cerebral infarction without residual deficits: Secondary | ICD-10-CM | POA: Diagnosis not present

## 2022-02-22 DIAGNOSIS — I10 Essential (primary) hypertension: Secondary | ICD-10-CM | POA: Diagnosis not present

## 2022-02-22 DIAGNOSIS — E872 Acidosis, unspecified: Secondary | ICD-10-CM | POA: Diagnosis present

## 2022-02-22 DIAGNOSIS — Z91199 Patient's noncompliance with other medical treatment and regimen due to unspecified reason: Secondary | ICD-10-CM | POA: Diagnosis not present

## 2022-02-22 DIAGNOSIS — E785 Hyperlipidemia, unspecified: Secondary | ICD-10-CM | POA: Diagnosis not present

## 2022-02-22 DIAGNOSIS — D649 Anemia, unspecified: Principal | ICD-10-CM | POA: Diagnosis present

## 2022-02-22 DIAGNOSIS — Z79899 Other long term (current) drug therapy: Secondary | ICD-10-CM | POA: Diagnosis not present

## 2022-02-22 DIAGNOSIS — N250 Renal osteodystrophy: Secondary | ICD-10-CM | POA: Diagnosis not present

## 2022-02-22 DIAGNOSIS — R69 Illness, unspecified: Secondary | ICD-10-CM | POA: Diagnosis not present

## 2022-02-22 DIAGNOSIS — I120 Hypertensive chronic kidney disease with stage 5 chronic kidney disease or end stage renal disease: Principal | ICD-10-CM | POA: Diagnosis present

## 2022-02-22 DIAGNOSIS — R799 Abnormal finding of blood chemistry, unspecified: Secondary | ICD-10-CM | POA: Diagnosis not present

## 2022-02-22 DIAGNOSIS — E875 Hyperkalemia: Secondary | ICD-10-CM | POA: Diagnosis not present

## 2022-02-22 DIAGNOSIS — Z0389 Encounter for observation for other suspected diseases and conditions ruled out: Secondary | ICD-10-CM | POA: Diagnosis not present

## 2022-02-22 DIAGNOSIS — D631 Anemia in chronic kidney disease: Secondary | ICD-10-CM | POA: Diagnosis not present

## 2022-02-22 DIAGNOSIS — R42 Dizziness and giddiness: Secondary | ICD-10-CM | POA: Diagnosis present

## 2022-02-22 DIAGNOSIS — E162 Hypoglycemia, unspecified: Secondary | ICD-10-CM | POA: Diagnosis not present

## 2022-02-22 DIAGNOSIS — E8779 Other fluid overload: Secondary | ICD-10-CM | POA: Diagnosis not present

## 2022-02-22 DIAGNOSIS — Z5329 Procedure and treatment not carried out because of patient's decision for other reasons: Secondary | ICD-10-CM | POA: Diagnosis present

## 2022-02-22 DIAGNOSIS — Z7902 Long term (current) use of antithrombotics/antiplatelets: Secondary | ICD-10-CM | POA: Diagnosis not present

## 2022-02-22 DIAGNOSIS — Z992 Dependence on renal dialysis: Secondary | ICD-10-CM

## 2022-02-22 DIAGNOSIS — N186 End stage renal disease: Secondary | ICD-10-CM | POA: Diagnosis present

## 2022-02-22 DIAGNOSIS — F17210 Nicotine dependence, cigarettes, uncomplicated: Secondary | ICD-10-CM | POA: Diagnosis present

## 2022-02-22 DIAGNOSIS — R944 Abnormal results of kidney function studies: Secondary | ICD-10-CM | POA: Diagnosis not present

## 2022-02-22 LAB — COMPREHENSIVE METABOLIC PANEL
ALT: 15 U/L (ref 0–44)
AST: 15 U/L (ref 15–41)
Albumin: 2.5 g/dL — ABNORMAL LOW (ref 3.5–5.0)
Alkaline Phosphatase: 120 U/L (ref 38–126)
Anion gap: 13 (ref 5–15)
BUN: 125 mg/dL — ABNORMAL HIGH (ref 6–20)
CO2: 13 mmol/L — ABNORMAL LOW (ref 22–32)
Calcium: 6.1 mg/dL — CL (ref 8.9–10.3)
Chloride: 112 mmol/L — ABNORMAL HIGH (ref 98–111)
Creatinine, Ser: 18.86 mg/dL — ABNORMAL HIGH (ref 0.61–1.24)
GFR, Estimated: 3 mL/min — ABNORMAL LOW (ref >60)
Glucose, Bld: 99 mg/dL (ref 70–99)
Potassium: 5.1 mmol/L (ref 3.5–5.1)
Sodium: 138 mmol/L (ref 135–145)
Total Bilirubin: 1.5 mg/dL — ABNORMAL HIGH (ref 0.3–1.2)
Total Protein: 5.2 g/dL — ABNORMAL LOW (ref 6.5–8.1)

## 2022-02-22 LAB — VITAMIN B12: Vitamin B-12: 405 pg/mL (ref 180–914)

## 2022-02-22 LAB — IRON AND TIBC
Iron: 160 ug/dL (ref 45–182)
Saturation Ratios: 81 % — ABNORMAL HIGH (ref 17.9–39.5)
TIBC: 199 ug/dL — ABNORMAL LOW (ref 250–450)
UIBC: 39 ug/dL

## 2022-02-22 LAB — CBC
HCT: 18.1 % — ABNORMAL LOW (ref 39.0–52.0)
Hemoglobin: 5.8 g/dL — CL (ref 13.0–17.0)
MCH: 30.9 pg (ref 26.0–34.0)
MCHC: 32 g/dL (ref 30.0–36.0)
MCV: 96.3 fL (ref 80.0–100.0)
Platelets: 180 10*3/uL (ref 150–400)
RBC: 1.88 MIL/uL — ABNORMAL LOW (ref 4.22–5.81)
RDW: 15.8 % — ABNORMAL HIGH (ref 11.5–15.5)
WBC: 4.9 10*3/uL (ref 4.0–10.5)
nRBC: 0 % (ref 0.0–0.2)

## 2022-02-22 LAB — FERRITIN: Ferritin: 804 ng/mL — ABNORMAL HIGH (ref 24–336)

## 2022-02-22 LAB — PROTIME-INR
INR: 1.3 — ABNORMAL HIGH (ref 0.8–1.2)
Prothrombin Time: 16.3 seconds — ABNORMAL HIGH (ref 11.4–15.2)

## 2022-02-22 LAB — CBG MONITORING, ED
Glucose-Capillary: 166 mg/dL — ABNORMAL HIGH (ref 70–99)
Glucose-Capillary: 17 mg/dL — CL (ref 70–99)
Glucose-Capillary: 67 mg/dL — ABNORMAL LOW (ref 70–99)
Glucose-Capillary: 87 mg/dL (ref 70–99)

## 2022-02-22 LAB — GLUCOSE, CAPILLARY
Glucose-Capillary: 141 mg/dL — ABNORMAL HIGH (ref 70–99)
Glucose-Capillary: 71 mg/dL (ref 70–99)
Glucose-Capillary: 82 mg/dL (ref 70–99)

## 2022-02-22 LAB — RETICULOCYTES
Immature Retic Fract: 11.3 % (ref 2.3–15.9)
RBC.: 1.89 MIL/uL — ABNORMAL LOW (ref 4.22–5.81)
Retic Count, Absolute: 27.4 10*3/uL (ref 19.0–186.0)
Retic Ct Pct: 1.5 % (ref 0.4–3.1)

## 2022-02-22 LAB — POC OCCULT BLOOD, ED: Fecal Occult Bld: NEGATIVE

## 2022-02-22 LAB — PREPARE RBC (CROSSMATCH)

## 2022-02-22 LAB — FOLATE: Folate: 5.7 ng/mL — ABNORMAL LOW (ref >5.9)

## 2022-02-22 LAB — ABO/RH: ABO/RH(D): O POS

## 2022-02-22 MED ORDER — ACETAMINOPHEN 650 MG RE SUPP: 650.0000 mg | Freq: Four times a day (QID) | RECTAL | Status: DC | PRN

## 2022-02-22 MED ORDER — ONDANSETRON HCL 4 MG/2ML IJ SOLN: 4.0000 mg | Freq: Four times a day (QID) | INTRAMUSCULAR | Status: DC | PRN

## 2022-02-22 MED ORDER — ONDANSETRON HCL 4 MG PO TABS: 4.0000 mg | ORAL_TABLET | Freq: Four times a day (QID) | ORAL | Status: DC | PRN

## 2022-02-22 MED ORDER — GENTAMICIN SULFATE 0.1 % EX CREA
1.0000 | TOPICAL_CREAM | Freq: Every day | CUTANEOUS | Status: DC
  Filled 2022-02-22: qty 15

## 2022-02-22 MED ORDER — DEXTROSE 50 % IV SOLN
1.0000 | Freq: Once | INTRAVENOUS | Status: AC
  Administered 2022-02-22: 50 mL via INTRAVENOUS
  Filled 2022-02-22: qty 50

## 2022-02-22 MED ORDER — DELFLEX-LC/2.5% DEXTROSE 394 MOSM/L IP SOLN: INTRAPERITONEAL | Status: DC

## 2022-02-22 MED ORDER — DEXTROSE 50 % IV SOLN: 25.0000 g | Freq: Once | INTRAVENOUS | Status: DC

## 2022-02-22 MED ORDER — DEXTROSE 50 % IV SOLN
INTRAVENOUS | Status: AC
  Filled 2022-02-22: qty 50

## 2022-02-22 MED ORDER — DEXTROSE 50 % IV SOLN
25.0000 g | Freq: Once | INTRAVENOUS | Status: AC
  Administered 2022-02-22: 25 g via INTRAVENOUS

## 2022-02-22 MED ORDER — CALCIUM GLUCONATE-NACL 1-0.675 GM/50ML-% IV SOLN
1.0000 g | Freq: Once | INTRAVENOUS | Status: AC
  Administered 2022-02-22: 1000 mg via INTRAVENOUS
  Filled 2022-02-22: qty 50

## 2022-02-22 MED ORDER — INSULIN ASPART 100 UNIT/ML IJ SOLN
5.0000 [IU] | Freq: Once | INTRAMUSCULAR | Status: AC
Start: 2022-02-22 — End: 2022-02-22
  Administered 2022-02-22: 5 [IU] via INTRAVENOUS

## 2022-02-22 MED ORDER — SERTRALINE HCL 50 MG PO TABS: 50.0000 mg | ORAL_TABLET | Freq: Every day | ORAL | Status: DC

## 2022-02-22 MED ORDER — CLOPIDOGREL BISULFATE 75 MG PO TABS
75.0000 mg | ORAL_TABLET | Freq: Every day | ORAL | Status: DC
  Administered 2022-02-22: 75 mg via ORAL
  Filled 2022-02-22: qty 1

## 2022-02-22 MED ORDER — AMLODIPINE BESYLATE 5 MG PO TABS
5.0000 mg | ORAL_TABLET | Freq: Every day | ORAL | Status: DC
  Administered 2022-02-22: 5 mg via ORAL
  Filled 2022-02-22: qty 1

## 2022-02-22 MED ORDER — SODIUM ZIRCONIUM CYCLOSILICATE 10 G PO PACK
10.0000 g | PACK | Freq: Once | ORAL | Status: AC
  Administered 2022-02-22: 10 g via ORAL
  Filled 2022-02-22: qty 1

## 2022-02-22 MED ORDER — LABETALOL HCL 200 MG PO TABS
200.0000 mg | ORAL_TABLET | Freq: Two times a day (BID) | ORAL | Status: DC
  Administered 2022-02-22: 200 mg via ORAL
  Filled 2022-02-22: qty 1

## 2022-02-22 MED ORDER — SILDENAFIL CITRATE 50 MG PO TABS: 50.0000 mg | ORAL_TABLET | Freq: Every day | ORAL | Status: DC | PRN

## 2022-02-22 MED ORDER — FUROSEMIDE 40 MG PO TABS: 40.0000 mg | ORAL_TABLET | Freq: Every morning | ORAL | Status: DC

## 2022-02-22 MED ORDER — DEXTROSE 10 % IV SOLN: INTRAVENOUS | Status: DC

## 2022-02-22 MED ORDER — ACETAMINOPHEN 325 MG PO TABS
650.0000 mg | ORAL_TABLET | Freq: Four times a day (QID) | ORAL | Status: DC | PRN
  Administered 2022-02-23 – 2022-02-24 (×3): 650 mg via ORAL
  Filled 2022-02-22 (×3): qty 2

## 2022-02-22 MED ORDER — SODIUM CHLORIDE 0.9 % IV SOLN: 10.0000 mL/h | Freq: Once | INTRAVENOUS | Status: DC

## 2022-02-22 MED ORDER — FUROSEMIDE 10 MG/ML IJ SOLN
80.0000 mg | INTRAMUSCULAR | Status: AC
  Administered 2022-02-22 – 2022-02-23 (×2): 80 mg via INTRAVENOUS
  Filled 2022-02-22 (×2): qty 8

## 2022-02-22 MED ORDER — DELFLEX-LC/4.25% DEXTROSE 483 MOSM/L IP SOLN: INTRAPERITONEAL | Status: DC

## 2022-02-22 MED ORDER — PRAVASTATIN SODIUM 10 MG PO TABS
10.0000 mg | ORAL_TABLET | Freq: Every day | ORAL | Status: DC
  Administered 2022-02-22: 10 mg via ORAL
  Filled 2022-02-22: qty 1

## 2022-02-22 MED ORDER — METHOCARBAMOL 500 MG PO TABS
500.0000 mg | ORAL_TABLET | Freq: Three times a day (TID) | ORAL | Status: DC | PRN
  Administered 2022-02-22 – 2022-02-24 (×4): 500 mg via ORAL
  Filled 2022-02-22 (×4): qty 1

## 2022-02-22 NOTE — ED Triage Notes (Signed)
Pt from home, returns after leaving AMA from [facility] on 7/27. Pt does peritoneal dialysis at home, was anemic and hyperkalemic at that visit but decided to leave to take care of some tasks at home and has now returned for admission.

## 2022-02-22 NOTE — Assessment & Plan Note (Addendum)
Hyperkalemia, hyperchloremia and non anion gap metabolic acidosis   Patient with significant uremia. K is upper limit of normal. Question efficacy of his peritoneal dialysis Plan to follow up with nephrology recommendations, patient may need to transition to hemodialysis to prevent recurrent uremia and hyperkalemia.

## 2022-02-22 NOTE — Consult Note (Signed)
Renal Service Consult Note [facility]  Derrick Moore 02/22/2022 Derrick Blazing, MD Requesting Physician: Dr. Billy Moore  Reason for Consult: ESRD pt w/ severe anemia HPI: The patient is a 56 y.o. year-old w/ hx of HTN, CVA and ESRD on PD who presented to ED 2 days ago on 7/27 for knee pain on the L. Work-up showed K+ 6.1, creat 18 and BUN 121, hb of 6.4. Pt was about to be admitted I believe but then left AMA. He returned today (missed PD Friday night, but got PD last night at home) and K+ is 5.1, BUN 125 , creat 18.  Ca 6.1, alb 2.5, CCa 7.4 and Hb 5.8. Pt to be admitted for anemia.  We are asked to see for dialysis.   Pt on PD since about 2017. Had temporary HD for a few months after hernia surgery via an HD cath placed in his chest, otherwise has no HD access. States she doesn't always do all his exchanges. Denies CP, SOB or abd pain , no hx cloudy fluid.   ROS - denies CP, no joint pain, no HA, no blurry vision, no rash, no diarrhea, no nausea/ vomiting, no dysuria, no difficulty voiding   Past Medical History  Past Medical History:  Diagnosis Date   Dialysis patient ([place])    Hyperlipidemia    Hypertension    Renal failure    Renal failure    Stroke [facility])    Past Surgical History No past surgical history on file. Family History No family history on file. Social History  reports that he has been smoking cigarettes. He has a 8.25 pack-year smoking history. He has never used smokeless tobacco. He reports that he does not currently use alcohol after a past usage of about 1.0 standard drink of alcohol per week. He reports current drug use. Drug: Marijuana. Allergies No Known Allergies Home medications Prior to Admission medications   Medication Sig Start Date End Date Taking? Authorizing Provider  amLODipine (NORVASC) 5 MG tablet Take 5 mg by mouth at bedtime. 07/04/20   [provider]  clopidogrel (PLAVIX) 75 MG tablet Take 75 mg by mouth daily.  07/04/20   [provider]  hydrALAZINE (APRESOLINE) 50 MG tablet Take 50 mg by mouth 2 (two) times daily. 07/04/20   [provider]  JUBLIA 10 % SOLN Apply topically. 09/06/20   [provider]  labetalol (NORMODYNE) 200 MG tablet Take 200 mg by mouth 2 (two) times daily. 07/04/20   [provider]  LASIX 40 MG tablet Take 40 mg by mouth every morning. 07/04/20   [provider]  lidocaine (LIDODERM) 5 % Place 1 patch onto the skin daily. Remove & Discard patch within 12 hours or as directed by MD 02/12/22   Derrick Sites, DO  lisinopril (ZESTRIL) 30 MG tablet Take 30 mg by mouth at bedtime. 07/04/20   [provider]  methocarbamol (ROBAXIN) 500 MG tablet Take 1 tablet (500 mg total) by mouth every 8 (eight) hours as needed for muscle spasms. 01/19/22   Moore, Derrick Olds, MD  methylPREDNISolone (MEDROL DOSEPAK) 4 MG TBPK tablet Follow package insert 02/12/22   Moore, Adam, DO  naproxen (NAPROSYN) 500 MG tablet Take 1 tablet (500 mg total) by mouth 2 (two) times daily. 02/07/22   Moore, Derrick Miller, PA-C  pravastatin (PRAVACHOL) 10 MG tablet Take 10 mg by mouth daily. 07/04/20   [provider]  sertraline (ZOLOFT) 50 MG tablet Take 1 tablet (50 mg  total) by mouth daily. 10/15/21   Moore, Derrick Miller, PA-C  sildenafil (VIAGRA) 50 MG tablet Take 1 tablet (50 mg total) by mouth daily as needed for erectile dysfunction. 09/11/20   Moore, Derrick Miller, PA-C     Vitals:   02/22/22 1537 02/22/22 1545 02/22/22 1600 02/22/22 1630  BP:  121/85 117/89 102/68  Pulse: 78 73 73 78  Resp: 16 13 16 13   Temp:      TempSrc:      SpO2: 100% 100% 100% 100%   Exam Gen alert, no distress No rash, cyanosis or gangrene Sclera anicteric, throat clear  No jvd or bruits Chest clear bilat to bases, no rales/ wheezing RRR no MRG Abd soft ntnd no mass or ascites +bs, PD cath intact mid-abdomen GU normal male MS no joint effusions or deformity Ext 1-2 + bilat pretib  edema, no wounds or ulcers Neuro is alert, Ox 3 , nf   Home meds include - amlodipine 5, clopidogrel, hydralazine 50 bid, labetalol 200 bid, lisinopril 30 hs, pravastatin 10, sertraline, prns/ vits/  supps    OP PD: [facility] at [facility] in [place], Dr Derrick Moore  He is not sure 4 or 5 cycles overnight, he usually does manual exchanges during the day , but has a cycler and occasionally does PD overnight   Fill volume 2000 cc  , not sure dwell time   Assessment/ Plan: Anemia - not sure cause, will get prbc's, per pmd. We don't have any OP records of esa's, IV Fe, etc... as he is not w/ our group.  ESRD - on PD w/ [facility] (Dr Derrick Moore). He is a bit vol overloaded and uremic w/ poor appetite and BUN > 120. Hopefully can get in a room tonight to get PD overnight. Orders are in. Have d/w ED mD.  HTN - would hold BP meds for now, or start just 1/2 of them, given the low BP's in the ED.   Volume - has 2+ edema L > R lower extremities. Will use half 2.5% and half 4.25% fluids.  H/o CVA - taking plavix HL - on statin      Derrick Splinter  MD 02/22/2022, 4:45 PM Recent Labs  Lab 02/20/22 1545 02/20/22 2106 02/22/22 1510  HGB 6.4*  --  5.8*  ALBUMIN  --   --  2.5*  CALCIUM 6.4* 6.9* 6.1*  CREATININE 18.06* 18.08* 18.86*  K 6.1* 5.1 5.1

## 2022-02-22 NOTE — Assessment & Plan Note (Addendum)
Continue blood pressure control with amlodipine and labetalol.  Hold on lisinopril due to hyperkalemia and hydralazine to prevent hypotension.  Continue close follow up on blood pressure.

## 2022-02-22 NOTE — Assessment & Plan Note (Signed)
Continue statin therapy.

## 2022-02-22 NOTE — H&P (Addendum)
History and Physical    Patient: Derrick Moore WTU:[id number] DOB: September 05, 1965 DOA: 02/22/2022 DOS: the patient was seen and examined on 02/22/2022 PCP: [name], PA-C  Patient coming from: Home  Chief Complaint:  Chief Complaint  Patient presents with   Abnormal Lab   HPI: Derrick Moore is a 56 y.o. male with medical history significant of ESRD on peritoneal dialysis, hypertension and dyslipidemia who presented with anemia and hyperkalemia. Patient was evaluated in the ED on 07/27 for follow up neck pain that was diagnosed on 07/19 and had prednisone taper prescribed. On 07/27 patient persistent left sided neck pain. He also reported worsening lower extremity edema. He was found uremic, hyperkalemic and uremic with recommendations for admission. Patient declined hospitalization and stated will come back to the ED.    At home patient with worsening generalized weakness and dizziness, easy fatigability and somnolence.  Persistent symptoms, worse with exertion and no associated melena, hematochezia or hematuria. No improving factors.  Confirms progressive and worsening lower extremity edema but not orthopnea or PND.  He still making urine. Denies any difficulty performing HD at home. No abdominal pain.   Because worsening symptoms he decided to come back to the ED.    Review of Systems: As mentioned in the history of present illness. All other systems reviewed and are negative. Past Medical History:  Diagnosis Date   Dialysis patient [facility])    Hyperlipidemia    Hypertension    Renal failure    Renal failure    Stroke ([place])    No past surgical history on file. Social History:  reports that he has been smoking cigarettes. He has a 8.25 pack-year smoking history. He has never used smokeless tobacco. He reports that he does not currently use alcohol after a past usage of about 1.0 standard drink of alcohol per week. He reports current drug use. Drug: Marijuana.  No Known  Allergies  No family history on file.  Prior to Admission medications   Medication Sig Start Date End Date Taking? Authorizing Provider  amLODipine (NORVASC) 5 MG tablet Take 5 mg by mouth at bedtime. 07/04/20   [provider]  clopidogrel (PLAVIX) 75 MG tablet Take 75 mg by mouth daily. 07/04/20   [provider]  hydrALAZINE (APRESOLINE) 50 MG tablet Take 50 mg by mouth 2 (two) times daily. 07/04/20   [provider]  JUBLIA 10 % SOLN Apply topically. 09/06/20   [provider]  labetalol (NORMODYNE) 200 MG tablet Take 200 mg by mouth 2 (two) times daily. 07/04/20   [provider]  LASIX 40 MG tablet Take 40 mg by mouth every morning. 07/04/20   [provider]  lidocaine (LIDODERM) 5 % Place 1 patch onto the skin daily. Remove & Discard patch within 12 hours or as directed by MD 02/12/22   Lennice Sites, DO  lisinopril (ZESTRIL) 30 MG tablet Take 30 mg by mouth at bedtime. 07/04/20   [provider]  methocarbamol (ROBAXIN) 500 MG tablet Take 1 tablet (500 mg total) by mouth every 8 (eight) hours as needed for muscle spasms. 01/19/22   Long, Wonda Olds, MD  methylPREDNISolone (MEDROL DOSEPAK) 4 MG TBPK tablet Follow package insert 02/12/22   Curatolo, Adam, DO  naproxen (NAPROSYN) 500 MG tablet Take 1 tablet (500 mg total) by mouth 2 (two) times daily. 02/07/22   Saguier, Percell Miller, PA-C  pravastatin (PRAVACHOL) 10 MG tablet Take 10 mg by mouth daily. 07/04/20   [provider]  sertraline (ZOLOFT) 50 MG tablet Take 1 tablet (50 mg total) by mouth daily. 10/15/21   Saguier, Percell Miller, PA-C  sildenafil (VIAGRA) 50 MG tablet Take 1 tablet (50 mg total) by mouth daily as needed for erectile dysfunction. 09/11/20   Saguier, Percell Miller, PA-C    Physical Exam: Vitals:   02/22/22 1630 02/22/22 1645 02/22/22 1700 02/22/22 1715  BP: 102/68 121/80 123/86 126/85  Pulse: 78 86 80 77  Resp: 13 18 12 14   Temp:      TempSrc:      SpO2: 100% 100% 100%  100%   BP 126/85   Pulse 77   Temp 98 F (36.7 C) (Oral)   Resp 14   SpO2 100%   Patient deconditioned and ill looking appearing, diaphoretic Neurology somnolent but easy to arouse, able to respond to questions and follow commands ENT with pallor but not icterus Cardiovascular with S1 and S2 present and rhythmic, tachycardic with no murmurs, rubs or gallops No JVD  Respiratory with no rales or wheezing Abdomen not tender or distended Positive lower extremity edema +++ non pitting. Cold lower extremities.   Data Reviewed:   56 yo male with ESRD on peritoneal dialysis who presents with worsening anemia, along with hyperkalemia and uremia.  Patient is diaphoretic and ill looking appearing, mild tachycardia but not hypotensive.  NA 138, K 5,1 Cl 112 bicarbonate 13 glucose 99 bun 125 cr 18,8  Iron 160, TIBC 199, transferrin saturation 81, ferritin 804.  Wbc 4.9 hgb 5,8 plt 180 INR 1,3   EKG 80 bpm, normal axis, normal intervals, sinus rhythm with no significant ST segment or T wave changes, positive LVH.   CT abdomen and pelvis no sings of retroperitoneal or abdominal bleeding.   Assessment and Plan: * Symptomatic anemia Likely anemia of chronic renal disease.  No clinical signs of acute blood loss anemia.   Plan to transfuse 2 units PRBC today Add 80 mg IV furosemide after each unit PRBC.  Patient will need EPO, he does have adequate iron stores.   Hypertension Continue blood pressure control with amlodipine and labetalol.  Hold on lisinopril due to hyperkalemia and hydralazine to prevent hypotension.  Continue close follow up on blood pressure.   ESRD on dialysis ([place]) Hyperkalemia, hyperchloremia and non anion gap metabolic acidosis   Patient with significant uremia. K is upper limit of normal. Question efficacy of his peritoneal dialysis Plan to follow up with nephrology recommendations, patient may need to transition to hemodialysis to prevent recurrent uremia and  hyperkalemia.   Dyslipidemia Continue statin therapy.  Depression Continue with sertraline.       Advance Care Planning:   Code Status: Not on file   Consults: Nephrology per ED   Family Communication: no family at the bedside   Severity of Illness: The appropriate patient status for this patient is INPATIENT. Inpatient status is judged to be reasonable and necessary in order to provide the required intensity of service to ensure the patient's safety. The patient's presenting symptoms, physical exam findings, and initial radiographic and laboratory data in the context of their chronic comorbidities is felt to place them at high risk for further clinical deterioration. Furthermore, it is not anticipated that the patient will be medically stable for discharge from the hospital within 2 midnights of admission.   * I certify that at the point of admission it is my clinical judgment that the patient will require inpatient hospital care spanning beyond 2 midnights from the point of admission due  to high intensity of service, high risk for further deterioration and high frequency of surveillance required.*  Author: Tawni Millers, MD 02/22/2022 5:28 PM  For on call review [email or web address].

## 2022-02-22 NOTE — ED Notes (Signed)
Patient transported to CT 

## 2022-02-22 NOTE — ED Provider Notes (Signed)
[facility] EMERGENCY DEPARTMENT Provider Note   CSN: [id number] Arrival date & time: 02/22/22  1417     History {Add pertinent medical, surgical, social history, OB history to HPI:1} Chief Complaint  Patient presents with   Abnormal Lab    Derrick Moore is a 56 y.o. male.  HPI     56yo male with history of ESRD on PD, hypertension, hyperlipidemia, CVA, htn, hlpd with concern for abnormal labs that were found incidentally when he had presented to [facility] with atraumatic left knee pain.  Reports that he thinks he injured himself when lifting something at work.  He initially had hip pain, then developed the left knee pain.  He has been on steroids.  Knee pain is worse with ambulation.  He has not had any fevers.  He had any x-ray without abnormalities.  He had labs that showed a bicarb of 12, BUN of 124, creatinine of 18, potassium of 6.1 and a hemoglobin of 6.4.  Hemoglobin is previously 9.89.7 in March 2022 with no other previous.  2 days ago was 6.4, and today is 5.8.  He denies black or bloody stools, hematuria, trauma, abdominal pain, back pain, chest pain, shortness of breath, cough, fever.  Past Medical History:  Diagnosis Date   Dialysis patient [facility])    Hyperlipidemia    Hypertension    Renal failure    Renal failure    Stroke [facility])      Home Medications Prior to Admission medications   Medication Sig Start Date End Date Taking? Authorizing Provider  amLODipine (NORVASC) 5 MG tablet Take 5 mg by mouth at bedtime. 07/04/20   [provider]  clopidogrel (PLAVIX) 75 MG tablet Take 75 mg by mouth daily. 07/04/20   [provider]  hydrALAZINE (APRESOLINE) 50 MG tablet Take 50 mg by mouth 2 (two) times daily. 07/04/20   [provider]  JUBLIA 10 % SOLN Apply topically. 09/06/20   [provider]  labetalol (NORMODYNE) 200 MG tablet Take 200 mg by mouth 2 (two) times daily. 07/04/20   [provider]  LASIX 40 MG tablet Take 40 mg by mouth every morning. 07/04/20   [provider]  lidocaine (LIDODERM) 5 % Place 1 patch onto the skin daily. Remove & Discard patch within 12 hours or as directed by MD 02/12/22   Lennice Sites, DO  lisinopril (ZESTRIL) 30 MG tablet Take 30 mg by mouth at bedtime. 07/04/20   [provider]  methocarbamol (ROBAXIN) 500 MG tablet Take 1 tablet (500 mg total) by mouth every 8 (eight) hours as needed for muscle spasms. 01/19/22   Long, Wonda Olds, MD  methylPREDNISolone (MEDROL DOSEPAK) 4 MG TBPK tablet Follow package insert 02/12/22   Curatolo, Adam, DO  naproxen (NAPROSYN) 500 MG tablet Take 1 tablet (500 mg total) by mouth 2 (two) times daily. 02/07/22   Saguier, Percell Miller, PA-C  pravastatin (PRAVACHOL) 10 MG tablet Take 10 mg by mouth daily. 07/04/20   [provider]  sertraline (ZOLOFT) 50 MG tablet Take 1 tablet (50 mg total) by mouth daily. 10/15/21   Saguier, Percell Miller, PA-C  sildenafil (VIAGRA) 50 MG tablet Take 1 tablet (50 mg total) by mouth daily as needed for erectile dysfunction. 09/11/20   Saguier, Percell Miller, PA-C      Allergies    Patient has no known allergies.    Review of Systems   Review of Systems  Physical Exam Updated Vital Signs BP  126/90   Pulse 78   Temp 98 F (36.7 C) (Oral)   Resp 16   SpO2 100%  Physical Exam  ED Results / Procedures / Treatments   Labs (all labs ordered are listed, but only abnormal results are displayed) Labs Reviewed  CBC - Abnormal; Notable for the following components:      Result Value   RBC 1.88 (*)    Hemoglobin 5.8 (*)    HCT 18.1 (*)    RDW 15.8 (*)    All other components within normal limits  COMPREHENSIVE METABOLIC PANEL  PROTIME-INR  VITAMIN B12  FOLATE  IRON AND TIBC  FERRITIN  RETICULOCYTES  I-STAT CHEM 8, ED  TYPE AND SCREEN  PREPARE RBC (CROSSMATCH)    EKG None  Radiology DG Chest 2 View  Result Date: 02/20/2022 CLINICAL DATA:  Shortness of  breath. EXAM: CHEST - 2 VIEW COMPARISON:  None Available. FINDINGS: Mild cardiomegaly and ectasia of the thoracic aorta are seen. Aortic atherosclerotic calcification incidentally noted. Both lungs are clear. IMPRESSION: Mild cardiomegaly. No active lung disease. Electronically Signed   By: Marlaine Hind M.D.   On: 02/20/2022 16:35   DG Knee Complete 4 Views Left  Result Date: 02/20/2022 CLINICAL DATA:  Worsening left knee pain.  No known injury. EXAM: LEFT KNEE - COMPLETE 4+ VIEW COMPARISON:  None Available. FINDINGS: No evidence of fracture, dislocation, or joint effusion. No evidence of arthropathy or other focal bone abnormality. Extensive peripheral vascular calcification noted. IMPRESSION: No acute findings. Extensive peripheral vascular calcification. Electronically Signed   By: Marlaine Hind M.D.   On: 02/20/2022 16:34    Procedures Procedures  {Document cardiac monitor, telemetry assessment procedure when appropriate:1}  Medications Ordered in ED Medications  insulin aspart (novoLOG) injection 5 Units (has no administration in time range)  dextrose 50 % solution 50 mL (has no administration in time range)  sodium zirconium cyclosilicate (LOKELMA) packet 10 g (has no administration in time range)  calcium gluconate 1 g/ 50 mL sodium chloride IVPB (has no administration in time range)  0.9 %  sodium chloride infusion (has no administration in time range)    ED Course/ Medical Decision Making/ A&P                           Medical Decision Making Amount and/or Complexity of Data Reviewed Labs: ordered.  Risk Prescription drug management.   ***  {Document critical care time when appropriate:1} {Document review of labs and clinical decision tools ie heart score, Chads2Vasc2 etc:1}  {Document your independent review of radiology images, and any outside records:1} {Document your discussion with family members, caretakers, and with consultants:1} {Document social determinants of  health affecting pt's care:1} {Document your decision making why or why not admission, treatments were needed:1} Final Clinical Impression(s) / ED Diagnoses Final diagnoses:  None    Rx / DC Orders ED Discharge Orders     None

## 2022-02-22 NOTE — Assessment & Plan Note (Addendum)
Likely anemia of chronic renal disease.  No clinical signs of acute blood loss anemia.   Plan to transfuse 2 units PRBC today Add 80 mg IV furosemide after each unit PRBC.  Patient will need EPO, he does have adequate iron stores.

## 2022-02-22 NOTE — Assessment & Plan Note (Signed)
Continue with sertraline  

## 2022-02-22 NOTE — ED Notes (Signed)
Admitting paged regarding potential issue r/t CBG. No answer; EDP consulted for same, VRBO for D10, pharmacy to consult

## 2022-02-23 ENCOUNTER — Encounter (HOSPITAL_COMMUNITY): Payer: Self-pay | Admitting: Internal Medicine

## 2022-02-23 ENCOUNTER — Other Ambulatory Visit: Payer: Self-pay

## 2022-02-23 DIAGNOSIS — Z992 Dependence on renal dialysis: Secondary | ICD-10-CM | POA: Diagnosis not present

## 2022-02-23 DIAGNOSIS — N186 End stage renal disease: Secondary | ICD-10-CM | POA: Diagnosis not present

## 2022-02-23 DIAGNOSIS — D649 Anemia, unspecified: Secondary | ICD-10-CM | POA: Diagnosis not present

## 2022-02-23 LAB — BASIC METABOLIC PANEL
Anion gap: 14 (ref 5–15)
BUN: 99 mg/dL — ABNORMAL HIGH (ref 6–20)
CO2: 15 mmol/L — ABNORMAL LOW (ref 22–32)
Calcium: 6.6 mg/dL — ABNORMAL LOW (ref 8.9–10.3)
Chloride: 108 mmol/L (ref 98–111)
Creatinine, Ser: 15.31 mg/dL — ABNORMAL HIGH (ref 0.61–1.24)
GFR, Estimated: 3 mL/min — ABNORMAL LOW (ref >60)
Glucose, Bld: 86 mg/dL (ref 70–99)
Potassium: 3.3 mmol/L — ABNORMAL LOW (ref 3.5–5.1)
Sodium: 137 mmol/L (ref 135–145)

## 2022-02-23 LAB — CBC
HCT: 25.2 % — ABNORMAL LOW (ref 39.0–52.0)
Hemoglobin: 8.5 g/dL — ABNORMAL LOW (ref 13.0–17.0)
MCH: 29 pg (ref 26.0–34.0)
MCHC: 33.7 g/dL (ref 30.0–36.0)
MCV: 86 fL (ref 80.0–100.0)
Platelets: 172 10*3/uL (ref 150–400)
RBC: 2.93 MIL/uL — ABNORMAL LOW (ref 4.22–5.81)
RDW: 17.6 % — ABNORMAL HIGH (ref 11.5–15.5)
WBC: 5.8 10*3/uL (ref 4.0–10.5)
nRBC: 0 % (ref 0.0–0.2)

## 2022-02-23 LAB — TYPE AND SCREEN
ABO/RH(D): O POS
Antibody Screen: NEGATIVE
Unit division: 0
Unit division: 0

## 2022-02-23 LAB — MAGNESIUM: Magnesium: 1.2 mg/dL — ABNORMAL LOW (ref 1.7–2.4)

## 2022-02-23 LAB — GLUCOSE, CAPILLARY
Glucose-Capillary: 118 mg/dL — ABNORMAL HIGH (ref 70–99)
Glucose-Capillary: 94 mg/dL (ref 70–99)
Glucose-Capillary: 150 mg/dL — ABNORMAL HIGH (ref 70–99)
Glucose-Capillary: 97 mg/dL (ref 70–99)
Glucose-Capillary: 149 mg/dL — ABNORMAL HIGH (ref 70–99)

## 2022-02-23 LAB — BPAM RBC
Blood Product Expiration Date: 202308292359
Blood Product Expiration Date: 202308292359
ISSUE DATE / TIME: 202307291749
ISSUE DATE / TIME: 202307292338
Unit Type and Rh: 5100
Unit Type and Rh: 5100

## 2022-02-23 LAB — HIV ANTIBODY (ROUTINE TESTING W REFLEX): HIV Screen 4th Generation wRfx: NONREACTIVE

## 2022-02-23 MED ORDER — HEPARIN 1000 UNIT/ML FOR PERITONEAL DIALYSIS
INTRAPERITONEAL | Status: DC | PRN
  Filled 2022-02-23: qty 6000

## 2022-02-23 MED ORDER — MUSCLE RUB 10-15 % EX CREA
TOPICAL_CREAM | CUTANEOUS | Status: DC | PRN
  Filled 2022-02-23: qty 85

## 2022-02-23 MED ORDER — GENTAMICIN SULFATE 0.1 % EX CREA
1.0000 | TOPICAL_CREAM | Freq: Every day | CUTANEOUS | Status: DC
  Filled 2022-02-23: qty 15

## 2022-02-23 MED ORDER — ORAL CARE MOUTH RINSE: 15.0000 mL | OROMUCOSAL | Status: DC | PRN

## 2022-02-23 MED ORDER — LISINOPRIL 20 MG PO TABS
30.0000 mg | ORAL_TABLET | Freq: Every day | ORAL | Status: DC
  Administered 2022-02-23: 30 mg via ORAL
  Filled 2022-02-23: qty 1

## 2022-02-23 MED ORDER — HYDRALAZINE HCL 20 MG/ML IJ SOLN: 10.0000 mg | INTRAMUSCULAR | Status: DC | PRN

## 2022-02-23 MED ORDER — MAGNESIUM SULFATE IN D5W 1-5 GM/100ML-% IV SOLN
1.0000 g | Freq: Once | INTRAVENOUS | Status: AC
  Administered 2022-02-23: 1 g via INTRAVENOUS
  Filled 2022-02-23: qty 100

## 2022-02-23 NOTE — Progress Notes (Signed)
PD tx initation time: 2154  Pre TX VS: T97.8-153/99-86-18-100% RA  PD treatment initiated via aseptic technique. Consent signed and in chart. Patient is alert and oriented. No complaints of pain. No specimen collected. PD exit site clean, dry and intact. New dressing applied.

## 2022-02-23 NOTE — Progress Notes (Signed)
  Progress Note   Patient: Derrick Moore WYS:[id number] DOB: November 18, 1965 DOA: 02/22/2022     1 DOS: the patient was seen and examined on 02/23/2022   Brief hospital course: 56 y.o. male with medical history significant of ESRD on peritoneal dialysis, hypertension and dyslipidemia who presented with anemia and hyperkalemia. Patient was evaluated in the ED on 07/27 for follow up neck pain that was diagnosed on 07/19 and had prednisone taper prescribed. On 07/27 patient persistent left sided neck pain. He also reported worsening lower extremity edema. He was found uremic, hyperkalemic and uremic with recommendations for admission. Patient declined hospitalization and stated will come back to the ED.     At home patient with worsening generalized weakness and dizziness, easy fatigability and somnolence.  Persistent symptoms, worse with exertion and no associated melena, hematochezia or hematuria. No improving factors.  Confirms progressive and worsening lower extremity edema but not orthopnea or PND.  He still making urine. Denies any difficulty performing HD at home. No abdominal pain.    Because worsening symptoms he decided to come back to the ED.   Assessment and Plan: * Symptomatic anemia Likely anemia of chronic renal disease.  No clinical signs of acute blood loss anemia.  Hgb improved with 2 units PRBC's Recheck cbc in aM   Hypertension Continue home lisinopril, meds confirmed with pharmacy.  Cont on PRN hydralazine   ESRD on dialysis (HCC) Hyperkalemia, hyperchloremia and non anion gap metabolic acidosis  Nephrology following, planning PD tonight   Dyslipidemia Continue statin therapy.   Depression Continue with sertraline.   L knee pain -Recent L knee xray reviewed, no fracture noted -No effusion on exam -Consulted PT -Cont analgesia as needed      Subjective: Complains of L knee pain  Physical Exam: Vitals:   02/23/22 0934 02/23/22 0936 02/23/22 0942 02/23/22  1647  BP: (!) 134/99   (!) 152/102  Pulse: 89   79  Resp: 18   18  Temp: 98.2 F (36.8 C)   99.2 F (37.3 C)  TempSrc: Oral   Oral  SpO2: 100%   100%  Weight:  63.8 kg    Height:   5\' 10"  (1.778 m)    General exam: Awake, laying in bed, in nad Respiratory system: Normal respiratory effort, no wheezing Cardiovascular system: regular rate, s1, s2 Gastrointestinal system: Soft, nondistended, positive BS Central nervous system: CN2-12 grossly intact, strength intact Extremities: Perfused, no clubbing Skin: Normal skin turgor, no notable skin lesions seen Psychiatry: Mood normal // no visual hallucinations   Data Reviewed:  Labs reviewed: Na 137, K 3.3, Hgb 8.5  Family Communication: Pt in room, family at bedside  Disposition: Status is: Inpatient Remains inpatient appropriate because: Severity of illness  Planned Discharge Destination: Home    Author: Marylu Lund, MD 02/23/2022 5:24 PM  For on call review [email or web address].

## 2022-02-23 NOTE — Progress Notes (Signed)
PD tx initation time: 2005  Pre TX VS: T98.9-152/105-93-18-99% RA  PD treatment initiated via aseptic technique. Lines patent. Consent signed and in chart. Patient is alert and oriented.   Patient states he cramped during treatment last night, no complaints of pain as of this time. No specimen collected. PD exit site clean, dry and intact. Gentamycin and new dressing applied.

## 2022-02-23 NOTE — Progress Notes (Signed)
[facility] Progress Note   Subjective:    Seen and examined patient at bedside. Feeling better today. Noted half 4.25% and half 2.5% bag were used overnight. He reports cramping and concerned that the concentration was too high. Currently denies SOB, CP, and N/V. Plan to use all 2.5% bags tonight.  Objective Vitals:   02/23/22 0328 02/23/22 0934 02/23/22 0936 02/23/22 0942  BP: (!) 133/100 (!) 134/99    Pulse: 92 89    Resp: 18 18    Temp: 99.1 F (37.3 C) 98.2 F (36.8 C)    TempSrc: Oral Oral    SpO2: 100% 100%    Weight:   63.8 kg   Height:    5\' 10"  (1.778 m)   Physical Exam General: Well-appearing; on RA; NAD Heart: S1 and S2; No murmurs, gallops, or rubs Lungs: Clear bilaterally Abdomen: Soft and non-tender; PD catheter dressing CDI Extremities: 1+ ankle edema bilaterally Dialysis Access: PD cath LUQ   V [facility] Weights   02/23/22 0936  Weight: 63.8 kg    Intake/Output Summary (Last 24 hours) at 02/23/2022 1134 Last data filed at 02/23/2022 0948 Gross per 24 hour  Intake 1710.42 ml  Output 1325 ml  Net 385.42 ml    Additional Objective Labs: Basic Metabolic Panel: Recent Labs  Lab 02/20/22 2106 02/22/22 1510 02/23/22 0734  NA 136 138 137  K 5.1 5.1 3.3*  CL 111 112* 108  CO2 12* 13* 15*  GLUCOSE 70 99 86  BUN 124* 125* 99*  CREATININE 18.08* 18.86* 15.31*  CALCIUM 6.9* 6.1* 6.6*   Liver Function Tests: Recent Labs  Lab 02/22/22 1510  AST 15  ALT 15  ALKPHOS 120  BILITOT 1.5*  PROT 5.2*  ALBUMIN 2.5*   No results for input(s): "LIPASE", "AMYLASE" in the last 168 hours. CBC: Recent Labs  Lab 02/20/22 1545 02/22/22 1510 02/23/22 0734  WBC 6.5 4.9 5.8  NEUTROABS 4.6  --   --   HGB 6.4* 5.8* 8.5*  HCT 20.2* 18.1* 25.2*  MCV 94.8 96.3 86.0  PLT 192 180 172   Blood Culture No results found for: "SDES", "SPECREQUEST", "CULT", "REPTSTATUS"  Cardiac Enzymes: No results for input(s): "CKTOTAL", "CKMB", "CKMBINDEX",  "TROPONINI" in the last 168 hours. CBG: Recent Labs  Lab 02/22/22 1937 02/22/22 2018 02/22/22 2351 02/23/22 0325 02/23/22 0723  GLUCAP 71 82 141* 118* 94   Iron Studies:  Recent Labs    02/22/22 1526  IRON 160  TIBC 199*  FERRITIN 804*   Lab Results  Component Value Date   INR 1.3 (H) 02/22/2022   Studies/Results: CT ABDOMEN PELVIS WO CONTRAST  Result Date: 02/22/2022 CLINICAL DATA:  Anemia. Evaluate for retroperitoneal bleeding. Peritoneal dialysis. EXAM: CT ABDOMEN AND PELVIS WITHOUT CONTRAST TECHNIQUE: Multidetector CT imaging of the abdomen and pelvis was performed following the standard protocol without IV contrast. RADIATION DOSE REDUCTION: This exam was performed according to the departmental dose-optimization program which includes automated exposure control, adjustment of the mA and/or kV according to patient size and/or use of iterative reconstruction technique. COMPARISON:  None Available. FINDINGS: Lower chest: No acute abnormality. Hepatobiliary: No focal liver abnormality is seen. No gallstones, gallbladder wall thickening, or biliary dilatation. Pancreas: Unremarkable. No pancreatic ductal dilatation or surrounding inflammatory changes. Spleen: Normal in size without focal abnormality. Adrenals/Urinary Tract: Mild nonspecific perinephric fat stranding bilaterally. No hydronephrosis. Adrenal glands and bladder are within normal limits. Stomach/Bowel: Stomach is within normal limits. Appendix appears normal. No evidence of bowel wall thickening, distention,  or inflammatory changes. The appendix is not seen. Vascular/Lymphatic: Aortic atherosclerosis. No enlarged abdominal or pelvic lymph nodes. Reproductive: Prostate is unremarkable. Other: Percutaneous peritoneal dialysis catheter is present coiled in the pelvis. There is a small amount of free fluid in the pelvis and left upper quadrant. There is no evidence for retroperitoneal hemorrhage. There is no focal abdominal wall  hernia. Musculoskeletal: Findings compatible with renal osteodystrophy. There is sclerosis of the bilateral sacroiliac joints. IMPRESSION: 1. No evidence for retroperitoneal hemorrhage or other acute hemorrhage in the abdomen/pelvis. 2. Peritoneal dialysis catheter in place. There is a small amount of free fluid in the abdomen and pelvis. 3. Mild nonspecific bilateral perinephric fat stranding. Correlate clinically. 4. Aortic Atherosclerosis (ICD10-I70.0). Electronically Signed   By: Ronney Asters M.D.   On: 02/22/2022 17:50    Medications:  sodium chloride Stopped (02/22/22 1632)   dialysis solution 2.5% low-MG/low-CA     dialysis solution 4.25% low-MG/low-CA      dextrose  25 g Intravenous Once   gentamicin cream  1 Application Topical Daily   lisinopril  30 mg Oral QHS    Dialysis Orders: [facility] at [facility] in [place], Dr Candiss Norse He is not sure 4 or 5 cycles overnight, he usually does manual exchanges during the day , but has a cycler and occasionally does PD overnight Fill volume 2000 cc  , not sure dwell time  Assessment/Plan: Anemia - not sure cause, will get prbc's, per pmd. We don't have any OP records of esa's, IV Fe, etc... as he is not w/ our group. Plan to obtain on Monday ESRD - on PD w/ [facility] (Dr Candiss Norse). He is feeling better. BUN trending down-now 99. Plan for PD tonight. HTN - Bps now improving. Continue Lisinopril  Volume - Still with bilateral lower extremity edema. He reported cramping overnight and concerned that the concentration bags were too high. Plan to use all 2.5% bags tonight. Monitor toleration. H/o CVA - taking plavix HL - on statin  Derrick Poet, NP [facility] 02/23/2022,11:34 AM  LOS: 1 day

## 2022-02-23 NOTE — Hospital Course (Signed)
56 y.o. male with medical history significant of ESRD on peritoneal dialysis, hypertension and dyslipidemia who presented with anemia and hyperkalemia. Patient was evaluated in the ED on 07/27 for follow up neck pain that was diagnosed on 07/19 and had prednisone taper prescribed. On 07/27 patient persistent left sided neck pain. He also reported worsening lower extremity edema. He was found uremic, hyperkalemic and uremic with recommendations for admission. Patient declined hospitalization and stated will come back to the ED.     At home patient with worsening generalized weakness and dizziness, easy fatigability and somnolence.  Persistent symptoms, worse with exertion and no associated melena, hematochezia or hematuria. No improving factors.  Confirms progressive and worsening lower extremity edema but not orthopnea or PND.  He still making urine. Denies any difficulty performing HD at home. No abdominal pain.    Because worsening symptoms he decided to come back to the ED.

## 2022-02-24 ENCOUNTER — Other Ambulatory Visit (HOSPITAL_COMMUNITY): Payer: Self-pay

## 2022-02-24 DIAGNOSIS — N250 Renal osteodystrophy: Secondary | ICD-10-CM | POA: Diagnosis not present

## 2022-02-24 DIAGNOSIS — D649 Anemia, unspecified: Secondary | ICD-10-CM | POA: Diagnosis not present

## 2022-02-24 DIAGNOSIS — I120 Hypertensive chronic kidney disease with stage 5 chronic kidney disease or end stage renal disease: Secondary | ICD-10-CM | POA: Diagnosis not present

## 2022-02-24 DIAGNOSIS — N186 End stage renal disease: Secondary | ICD-10-CM | POA: Diagnosis not present

## 2022-02-24 DIAGNOSIS — R799 Abnormal finding of blood chemistry, unspecified: Secondary | ICD-10-CM

## 2022-02-24 DIAGNOSIS — Z992 Dependence on renal dialysis: Secondary | ICD-10-CM | POA: Diagnosis not present

## 2022-02-24 DIAGNOSIS — E8779 Other fluid overload: Secondary | ICD-10-CM | POA: Diagnosis not present

## 2022-02-24 LAB — COMPREHENSIVE METABOLIC PANEL
ALT: 16 U/L (ref 0–44)
AST: 14 U/L — ABNORMAL LOW (ref 15–41)
Albumin: 2.3 g/dL — ABNORMAL LOW (ref 3.5–5.0)
Alkaline Phosphatase: 118 U/L (ref 38–126)
Anion gap: 11 (ref 5–15)
BUN: 86 mg/dL — ABNORMAL HIGH (ref 6–20)
CO2: 20 mmol/L — ABNORMAL LOW (ref 22–32)
Calcium: 6.8 mg/dL — ABNORMAL LOW (ref 8.9–10.3)
Chloride: 106 mmol/L (ref 98–111)
Creatinine, Ser: 14.66 mg/dL — ABNORMAL HIGH (ref 0.61–1.24)
GFR, Estimated: 4 mL/min — ABNORMAL LOW (ref >60)
Glucose, Bld: 128 mg/dL — ABNORMAL HIGH (ref 70–99)
Potassium: 3.4 mmol/L — ABNORMAL LOW (ref 3.5–5.1)
Sodium: 137 mmol/L (ref 135–145)
Total Bilirubin: 1.8 mg/dL — ABNORMAL HIGH (ref 0.3–1.2)
Total Protein: 5.3 g/dL — ABNORMAL LOW (ref 6.5–8.1)

## 2022-02-24 LAB — CBC
HCT: 24.4 % — ABNORMAL LOW (ref 39.0–52.0)
Hemoglobin: 8.2 g/dL — ABNORMAL LOW (ref 13.0–17.0)
MCH: 28.9 pg (ref 26.0–34.0)
MCHC: 33.6 g/dL (ref 30.0–36.0)
MCV: 85.9 fL (ref 80.0–100.0)
Platelets: 170 10*3/uL (ref 150–400)
RBC: 2.84 MIL/uL — ABNORMAL LOW (ref 4.22–5.81)
RDW: 17.6 % — ABNORMAL HIGH (ref 11.5–15.5)
WBC: 5.2 10*3/uL (ref 4.0–10.5)
nRBC: 0 % (ref 0.0–0.2)

## 2022-02-24 LAB — GLUCOSE, CAPILLARY: Glucose-Capillary: 102 mg/dL — ABNORMAL HIGH (ref 70–99)

## 2022-02-24 MED ORDER — FOLIC ACID 1 MG PO TABS
1.0000 mg | ORAL_TABLET | Freq: Every day | ORAL | 0 refills | Status: AC
  Filled 2022-02-24: qty 30, 30d supply, fill #0

## 2022-02-24 MED ORDER — DELFLEX-LC/2.5% DEXTROSE 394 MOSM/L IP SOLN: INTRAPERITONEAL | Status: DC

## 2022-02-24 MED ORDER — DARBEPOETIN ALFA 150 MCG/0.3ML IJ SOSY
150.0000 ug | PREFILLED_SYRINGE | INTRAMUSCULAR | Status: DC
  Administered 2022-02-24: 150 ug via SUBCUTANEOUS
  Filled 2022-02-24 (×2): qty 0.3

## 2022-02-24 MED ORDER — GENTAMICIN SULFATE 0.1 % EX CREA
1.0000 | TOPICAL_CREAM | Freq: Every day | CUTANEOUS | Status: DC
  Filled 2022-02-24: qty 15

## 2022-02-24 MED ORDER — POTASSIUM CHLORIDE CRYS ER 20 MEQ PO TBCR
40.0000 meq | EXTENDED_RELEASE_TABLET | Freq: Once | ORAL | Status: AC
Start: 2022-02-24 — End: 2022-02-24
  Administered 2022-02-24: 40 meq via ORAL
  Filled 2022-02-24: qty 2

## 2022-02-24 MED ORDER — FOLIC ACID 1 MG PO TABS
1.0000 mg | ORAL_TABLET | Freq: Every day | ORAL | Status: DC
  Administered 2022-02-24: 1 mg via ORAL
  Filled 2022-02-24: qty 1

## 2022-02-24 MED ORDER — ACETAMINOPHEN 325 MG PO TABS: 650.0000 mg | ORAL_TABLET | Freq: Four times a day (QID) | ORAL | Status: DC | PRN

## 2022-02-24 NOTE — Discharge Summary (Signed)
Physician Discharge Summary   Patient: Derrick Moore MRN: [id number] DOB: 17-Aug-1965  Admit date:     02/22/2022  Discharge date: 02/24/22  Discharge Physician: Marylu Lund   PCP: [name], PA-C   Recommendations at discharge:    Follow up with PCP in 1-2 weeks Continue with outpatient PT as scheduled  Discharge Diagnoses: Principal Problem:   Symptomatic anemia Active Problems:   Hypertension   ESRD on dialysis ([place])   Dyslipidemia   Depression  Resolved Problems:   * No resolved hospital problems. *  Hospital Course: 56 y.o. male with medical history significant of ESRD on peritoneal dialysis, hypertension and dyslipidemia who presented with anemia and hyperkalemia. Patient was evaluated in the ED on 07/27 for follow up neck pain that was diagnosed on 07/19 and had prednisone taper prescribed. On 07/27 patient persistent left sided neck pain. He also reported worsening lower extremity edema. He was found uremic, hyperkalemic and uremic with recommendations for admission. Patient declined hospitalization and stated will come back to the ED.     At home patient with worsening generalized weakness and dizziness, easy fatigability and somnolence.  Persistent symptoms, worse with exertion and no associated melena, hematochezia or hematuria. No improving factors.  Confirms progressive and worsening lower extremity edema but not orthopnea or PND.  He still making urine. Denies any difficulty performing HD at home. No abdominal pain.    Because worsening symptoms he decided to come back to the ED.   Assessment and Plan: * Symptomatic anemia Likely anemia of chronic renal disease.  No clinical signs of acute blood loss anemia.  Hgb improved with 2 units PRBC's and remained stable, with hgb 8.3 at time of dc Iron and b12 within normal limits. Folate borderline low at 5.7, supplement prescribed Recheck cbc in aM   Hypertension Continue home lisinopril, meds confirmed  with pharmacy.    ESRD on dialysis ([place]) Hyperkalemia, hyperchloremia and non anion gap metabolic acidosis  Nephrology following, underwent PD in hospital   Dyslipidemia Continue statin therapy.   Depression Continue with sertraline.    L knee pain -Recent L knee xray reviewed, no fracture noted -No effusion on exam -Seen by PT with recs for outpatient PT      Consultants: Nephrology Procedures performed:   Disposition: Home Diet recommendation:  Renal diet DISCHARGE MEDICATION: Allergies as of 02/24/2022   No Known Allergies      Medication List     STOP taking these medications    methylPREDNISolone 4 MG Tbpk tablet Commonly known as: MEDROL DOSEPAK   sertraline 50 MG tablet Commonly known as: ZOLOFT   sildenafil 50 MG tablet Commonly known as: Viagra       TAKE these medications    acetaminophen 325 MG tablet Commonly known as: Tylenol Take 2 tablets (650 mg total) by mouth every 6 (six) hours as needed for moderate pain.   folic acid 1 MG tablet Commonly known as: FOLVITE Take 1 tablet (1 mg total) by mouth daily. Start taking on: February 25, 2022   lidocaine 5 % Commonly known as: Lidoderm Place 1 patch onto the skin daily. Remove & Discard patch within 12 hours or as directed by MD   lisinopril 30 MG tablet Commonly known as: ZESTRIL Take 30 mg by mouth at bedtime.   methocarbamol 500 MG tablet Commonly known as: ROBAXIN Take 1 tablet (500 mg total) by mouth every 8 (eight) hours as needed for muscle spasms.   naproxen 500 MG tablet Commonly  known as: NAPROSYN Take 1 tablet (500 mg total) by mouth 2 (two) times daily. What changed:  when to take this reasons to take this               Durable Medical Equipment  (From admission, onward)           Start     Ordered   02/24/22 1347  For home use only DME Walker rolling  Once       Question Answer Comment  Walker: With 5 Inch Wheels   Patient needs a walker to treat with  the following condition Left knee pain      02/24/22 1346            Follow-up Information     [facility]. Call.   Why: Call to schedule first visit. Contact information: [address] [place], [place] [address] [phone]        Saguier, Percell Miller, PA-C Follow up in 2 week(s).   Specialties: Internal Medicine, Family Medicine Why: Hospital follow up Contact information: [address] [place] [address] [phone]                Discharge Exam: Danley Danker Weights   02/23/22 0936  Weight: 63.8 kg   General exam: Awake, laying in bed, in nad Respiratory system: Normal respiratory effort, no wheezing Cardiovascular system: regular rate, s1, s2 Gastrointestinal system: Soft, nondistended, positive BS Central nervous system: CN2-12 grossly intact, strength intact Extremities: Perfused, no clubbing Skin: Normal skin turgor, no notable skin lesions seen Psychiatry: Mood normal // no visual hallucinations   Condition at discharge: fair  The results of significant diagnostics from this hospitalization (including imaging, microbiology, ancillary and laboratory) are listed below for reference.   Imaging Studies: CT ABDOMEN PELVIS WO CONTRAST  Result Date: 02/22/2022 CLINICAL DATA:  Anemia. Evaluate for retroperitoneal bleeding. Peritoneal dialysis. EXAM: CT ABDOMEN AND PELVIS WITHOUT CONTRAST TECHNIQUE: Multidetector CT imaging of the abdomen and pelvis was performed following the standard protocol without IV contrast. RADIATION DOSE REDUCTION: This exam was performed according to the departmental dose-optimization program which includes automated exposure control, adjustment of the mA and/or kV according to patient size and/or use of iterative reconstruction technique. COMPARISON:  None Available. FINDINGS: Lower chest: No acute abnormality. Hepatobiliary: No focal liver abnormality is seen. No gallstones,  gallbladder wall thickening, or biliary dilatation. Pancreas: Unremarkable. No pancreatic ductal dilatation or surrounding inflammatory changes. Spleen: Normal in size without focal abnormality. Adrenals/Urinary Tract: Mild nonspecific perinephric fat stranding bilaterally. No hydronephrosis. Adrenal glands and bladder are within normal limits. Stomach/Bowel: Stomach is within normal limits. Appendix appears normal. No evidence of bowel wall thickening, distention, or inflammatory changes. The appendix is not seen. Vascular/Lymphatic: Aortic atherosclerosis. No enlarged abdominal or pelvic lymph nodes. Reproductive: Prostate is unremarkable. Other: Percutaneous peritoneal dialysis catheter is present coiled in the pelvis. There is a small amount of free fluid in the pelvis and left upper quadrant. There is no evidence for retroperitoneal hemorrhage. There is no focal abdominal wall hernia. Musculoskeletal: Findings compatible with renal osteodystrophy. There is sclerosis of the bilateral sacroiliac joints. IMPRESSION: 1. No evidence for retroperitoneal hemorrhage or other acute hemorrhage in the abdomen/pelvis. 2. Peritoneal dialysis catheter in place. There is a small amount of free fluid in the abdomen and pelvis. 3. Mild nonspecific bilateral perinephric fat stranding. Correlate clinically. 4. Aortic Atherosclerosis (ICD10-I70.0). Electronically Signed   By: Tina Griffiths.D.  On: 02/22/2022 17:50   DG Chest 2 View  Result Date: 02/20/2022 CLINICAL DATA:  Shortness of breath. EXAM: CHEST - 2 VIEW COMPARISON:  None Available. FINDINGS: Mild cardiomegaly and ectasia of the thoracic aorta are seen. Aortic atherosclerotic calcification incidentally noted. Both lungs are clear. IMPRESSION: Mild cardiomegaly. No active lung disease. Electronically Signed   By: Marlaine Hind M.D.   On: 02/20/2022 16:35   DG Knee Complete 4 Views Left  Result Date: 02/20/2022 CLINICAL DATA:  Worsening left knee pain.  No known  injury. EXAM: LEFT KNEE - COMPLETE 4+ VIEW COMPARISON:  None Available. FINDINGS: No evidence of fracture, dislocation, or joint effusion. No evidence of arthropathy or other focal bone abnormality. Extensive peripheral vascular calcification noted. IMPRESSION: No acute findings. Extensive peripheral vascular calcification. Electronically Signed   By: Marlaine Hind M.D.   On: 02/20/2022 16:34    Microbiology: No results found for this or any previous visit.  Labs: CBC: Recent Labs  Lab 02/20/22 1545 02/22/22 1510 02/23/22 0734 02/24/22 0242  WBC 6.5 4.9 5.8 5.2  NEUTROABS 4.6  --   --   --   HGB 6.4* 5.8* 8.5* 8.2*  HCT 20.2* 18.1* 25.2* 24.4*  MCV 94.8 96.3 86.0 85.9  PLT 192 180 172 824   Basic Metabolic Panel: Recent Labs  Lab 02/20/22 1545 02/20/22 2106 02/22/22 1510 02/23/22 0734 02/24/22 0242  NA 136 136 138 137 137  K 6.1* 5.1 5.1 3.3* 3.4*  CL 111 111 112* 108 106  CO2 12* 12* 13* 15* 20*  GLUCOSE 84 70 99 86 128*  BUN 121* 124* 125* 99* 86*  CREATININE 18.06* 18.08* 18.86* 15.31* 14.66*  CALCIUM 6.4* 6.9* 6.1* 6.6* 6.8*  MG  --   --   --  1.2*  --    Liver Function Tests: Recent Labs  Lab 02/22/22 1510 02/24/22 0242  AST 15 14*  ALT 15 16  ALKPHOS 120 118  BILITOT 1.5* 1.8*  PROT 5.2* 5.3*  ALBUMIN 2.5* 2.3*   CBG: Recent Labs  Lab 02/23/22 0723 02/23/22 1136 02/23/22 1644 02/23/22 2036 02/24/22 0808  GLUCAP 94 150* 97 149* 102*    Discharge time spent: less than 30 minutes.  Signed: Marylu Lund, MD [facility] 02/24/2022

## 2022-02-24 NOTE — Progress Notes (Addendum)
[facility] Progress Note   Subjective:   Patient seen and examined in room.  Sitting up eating lunch.  Feeling ok.  Tolerated dialysis well overnight.  No cramping with using all 2.5% bags.  Denies bleeding, melena, n/v/d, abdominal pain, CP, SOB and weakness.    Called outpatient unit who reported patient has not been complaint with therapy and lab draws.  Last 2 Hgb 6.4 and 5.3, did not show up for ESA therapy per nurse.  Objective Vitals:   02/23/22 1647 02/23/22 2036 02/24/22 0515 02/24/22 0845  BP: (!) 152/102 (!) 152/105 (!) 143/99 (!) 142/98  Pulse: 79 93 79 82  Resp: 18 18 18 19   Temp: 99.2 F (37.3 C) 98.9 F (37.2 C) 98.4 F (36.9 C) 98 F (36.7 C)  TempSrc: Oral Oral  Oral  SpO2: 100% 99% 100% 100%  Weight:      Height:       Physical Exam General: well appearing male in NAD  Heart:RRR, no mrg Lungs:mostly CTAB, scattered rhonchi Abdomen:soft, NTND Extremities:trace LE edema Dialysis Access: PD cath   [facility]   02/23/22 0936  Weight: 63.8 kg    Intake/Output Summary (Last 24 hours) at 02/24/2022 1328 Last data filed at 02/24/2022 1124 Gross per 24 hour  Intake 1180 ml  Output 1175 ml  Net 5 ml    Additional Objective Labs: Basic Metabolic Panel: Recent Labs  Lab 02/22/22 1510 02/23/22 0734 02/24/22 0242  NA 138 137 137  K 5.1 3.3* 3.4*  CL 112* 108 106  CO2 13* 15* 20*  GLUCOSE 99 86 128*  BUN 125* 99* 86*  CREATININE 18.86* 15.31* 14.66*  CALCIUM 6.1* 6.6* 6.8*   Liver Function Tests: Recent Labs  Lab 02/22/22 1510 02/24/22 0242  AST 15 14*  ALT 15 16  ALKPHOS 120 118  BILITOT 1.5* 1.8*  PROT 5.2* 5.3*  ALBUMIN 2.5* 2.3*   CBC: Recent Labs  Lab 02/20/22 1545 02/22/22 1510 02/23/22 0734 02/24/22 0242  WBC 6.5 4.9 5.8 5.2  NEUTROABS 4.6  --   --   --   HGB 6.4* 5.8* 8.5* 8.2*  HCT 20.2* 18.1* 25.2* 24.4*  MCV 94.8 96.3 86.0 85.9  PLT 192 180 172 170    CBG: Recent Labs  Lab 02/23/22 0723  02/23/22 1136 02/23/22 1644 02/23/22 2036 02/24/22 0808  GLUCAP 94 150* 97 149* 102*   Iron Studies:  Recent Labs    02/22/22 1526  IRON 160  TIBC 199*  FERRITIN 804*   Lab Results  Component Value Date   INR 1.3 (H) 02/22/2022   Studies/Results: CT ABDOMEN PELVIS WO CONTRAST  Result Date: 02/22/2022 CLINICAL DATA:  Anemia. Evaluate for retroperitoneal bleeding. Peritoneal dialysis. EXAM: CT ABDOMEN AND PELVIS WITHOUT CONTRAST TECHNIQUE: Multidetector CT imaging of the abdomen and pelvis was performed following the standard protocol without IV contrast. RADIATION DOSE REDUCTION: This exam was performed according to the departmental dose-optimization program which includes automated exposure control, adjustment of the mA and/or kV according to patient size and/or use of iterative reconstruction technique. COMPARISON:  None Available. FINDINGS: Lower chest: No acute abnormality. Hepatobiliary: No focal liver abnormality is seen. No gallstones, gallbladder wall thickening, or biliary dilatation. Pancreas: Unremarkable. No pancreatic ductal dilatation or surrounding inflammatory changes. Spleen: Normal in size without focal abnormality. Adrenals/Urinary Tract: Mild nonspecific perinephric fat stranding bilaterally. No hydronephrosis. Adrenal glands and bladder are within normal limits. Stomach/Bowel: Stomach is within normal limits. Appendix appears normal. No evidence of bowel wall  thickening, distention, or inflammatory changes. The appendix is not seen. Vascular/Lymphatic: Aortic atherosclerosis. No enlarged abdominal or pelvic lymph nodes. Reproductive: Prostate is unremarkable. Other: Percutaneous peritoneal dialysis catheter is present coiled in the pelvis. There is a small amount of free fluid in the pelvis and left upper quadrant. There is no evidence for retroperitoneal hemorrhage. There is no focal abdominal wall hernia. Musculoskeletal: Findings compatible with renal osteodystrophy.  There is sclerosis of the bilateral sacroiliac joints. IMPRESSION: 1. No evidence for retroperitoneal hemorrhage or other acute hemorrhage in the abdomen/pelvis. 2. Peritoneal dialysis catheter in place. There is a small amount of free fluid in the abdomen and pelvis. 3. Mild nonspecific bilateral perinephric fat stranding. Correlate clinically. 4. Aortic Atherosclerosis (ICD10-I70.0). Electronically Signed   By: Ronney Asters M.D.   On: 02/22/2022 17:50    Medications:  sodium chloride Stopped (02/22/22 1632)   dialysis solution 2.5% low-MG/low-CA     dialysis solution 4.25% low-MG/low-CA      dextrose  25 g Intravenous Once   folic acid  1 mg Oral Daily   gentamicin cream  1 Application Topical Daily   lisinopril  30 mg Oral QHS    Dialysis Orders: [facility] at [facility] in [place], Dr Candiss Norse 4 cycles. 2hrs 50min dwell time 2.2L fill Volume  BUT he usually does manual exchanges during the day, with occasional  use of cycler overnight    Assessment/Plan: Anemia - chronic issue.  Missing ESA 2/2 non compliance.  Hgb improved to 8.4 s/p 2units pRBC.  No indication for iron with tsat 81%.  Will order aranesp to be given today. Medical non compliance - stress importance of daily PD, regular labs and taking medications as Rx. Hyperkalemia - likely 2/2 non compliance.  Now hypokalemic.  KCl supplement given. ESRD - on PD w/ [facility]. He is feeling better. BUN trending down-now 86. Continue nightly PD. HTN - BP variable. Continue Lisinopril and prn hydralazine. Volume - Volume status improving. Cramping improved with 2.5% last night. Use all 2.5% bags tonight. Titrate down volume as tolerated. Monitor toleration. H/o CVA - taking plavix HL - on statin  Jen Mow, PA-C [facility] 02/24/2022,1:28 PM  LOS: 2 days   Nephrology attending: The patient was seen and examined.  Chart reviewed.  I agree with above. ESRD on PD with anemia.  Hemoglobin is stable.  No sign of  bleeding. Continue ESA.  Use 2.5% solution with PD.  Resume PD nightly after discharge from the hospital.  Discussed with the primary team.  Katheran James, [facility].

## 2022-02-24 NOTE — Consult Note (Signed)
   [facility] Inpatient Consult   02/24/2022  [place] 01-17-1966 [id number]  [facility] [ACO] Patient:  Primary Care Provider:  Valda Favia at [facility], is an embedded provider with a Chronic Care Management team and program, and is listed for the transition of care follow up and appointments.  Patient was screened for Embedded practice service needs for chronic care management and came to bedside to speak with patient. However, patient was in the bathroom. Will follow back up on round.  Plan: A referral can be sent to the [facility] Management for [facility] needs for post hospital care coordination, if needs arises.  Please contact for further questions,  Natividad Brood, RN BSN [facility] Liaison  [phone] business mobile phone Toll free office [phone]  Fax number: [phone] [email or web address]

## 2022-02-24 NOTE — Progress Notes (Signed)
Physical Therapy Evaluation Patient Details Name: Derrick Moore MRN: [id number] DOB: 05-17-1966 Today's Date: 02/24/2022  History of Present Illness  56 y.o. male  who presented 02/22/22 with anemia and hyperkalemia. +recent onset left knee pain  PMH significant of ESRD on peritoneal dialysis, hypertension, stroke and dyslipidemia  Clinical Impression   Pt admitted secondary to problem above with deficits below. PTA patient was independent, living alone and working at [employer].  Pt currently requires supervision for use of RW during ambulation and can benefit from OPPT to address knee pain.  Anticipate patient will benefit from PT to address problems listed below.Will continue to follow acutely to maximize functional mobility independence and safety.          Recommendations for follow up therapy are one component of a multi-disciplinary discharge planning process, led by the attending physician.  Recommendations may be updated based on patient status, additional functional criteria and insurance authorization.  Follow Up Recommendations Outpatient PT (will need new referral for left knee pain)      Assistance Recommended at Discharge PRN  Patient can return home with the following       Equipment Recommendations Rolling walker (2 wheels)  Recommendations for Other Services       Functional Status Assessment Patient has had a recent decline in their functional status and demonstrates the ability to make significant improvements in function in a reasonable and predictable amount of time.     Precautions / Restrictions Precautions Precautions: Fall      Mobility  Bed Mobility Overal bed mobility: Independent                  Transfers Overall transfer level: Independent Equipment used: Straight cane               General transfer comment: did not use cane to come to stand, turned to pick up cane once standing    Ambulation/Gait Ambulation/Gait  assistance: Min guard, Modified independent (Device/Increase time) Gait Distance (Feet): 10 Feet (with cane; 120 with RW) Assistive device: Rolling walker (2 wheels), Straight cane Gait Pattern/deviations: Step-to pattern, Step-through pattern, Decreased stride length, Decreased weight shift to left, Antalgic Gait velocity: decr Gait velocity interpretation: 1.31 - 2.62 ft/sec, indicative of limited community ambulator   General Gait Details: with cane, pt instructed to use opposite his "bad" leg and required step-by-step cues for sequencing and with limited ability to unload sore LLE; switched to RW and pt agreed much more comfortable and felt more  secure with bil UE support.  Stairs            Wheelchair Mobility    Modified Rankin (Stroke Patients Only)       Balance Overall balance assessment: Mild deficits observed, not formally tested (without use of device; modified independent with RW)                                           Pertinent Vitals/Pain Pain Assessment Pain Assessment: 0-10 Pain Score: 5  Pain Location: posterior left knee Pain Descriptors / Indicators: Grimacing, Guarding Pain Intervention(s): Limited activity within patient's tolerance, Monitored during session, Repositioned    Home Living Family/patient expects to be discharged to:: Private residence Living Arrangements: Alone   Type of Home: Apartment Home Access: Level entry       Home Layout: One level Home Equipment: Cane - single point (  borrowed from mother)      Prior Function Prior Level of Function : Independent/Modified Independent             Mobility Comments: as pain began,  he used a cart to walk with at work and noticed it helped decr his pain       Hand Dominance        Extremity/Trunk Assessment   Upper Extremity Assessment Upper Extremity Assessment: Overall WFL for tasks assessed    Lower Extremity Assessment Lower Extremity Assessment:  LLE deficits/detail LLE Deficits / Details: no edema noted; pt points to posterior upper calf muscle as point of pain; no palpable mass but tender on palpation and with resisted plantarflexion (also notes pain during terminal stance to swing through with gait)    Cervical / Trunk Assessment Cervical / Trunk Assessment: Normal  Communication   Communication: No difficulties  Cognition Arousal/Alertness: Awake/alert Behavior During Therapy: WFL for tasks assessed/performed Overall Cognitive Status: Within Functional Limits for tasks assessed                                          General Comments General comments (skin integrity, edema, etc.): Patient reports had been going to PT for his hip and now pain has moved to his knee.    Exercises Other Exercises Other Exercises: instructed in AROM ankle pumps for gentle movement/stretching of calf muscles   Assessment/Plan    PT Assessment Patient needs continued PT services  PT Problem List Decreased activity tolerance;Decreased balance;Decreased mobility;Decreased knowledge of use of DME;Pain       PT Treatment Interventions DME instruction;Gait training;Functional mobility training;Therapeutic activities;Therapeutic exercise;Patient/family education    PT Goals (Current goals can be found in the Care Plan section)  Acute Rehab PT Goals Patient Stated Goal: decr pain and return to work PT Goal Formulation: With patient Time For Goal Achievement: 03/10/22 Potential to Achieve Goals: Good    Frequency Min 3X/week     Co-evaluation               AM-PAC PT "6 Clicks" Mobility  Outcome Measure Help needed turning from your back to your side while in a flat bed without using bedrails?: None Help needed moving from lying on your back to sitting on the side of a flat bed without using bedrails?: None Help needed moving to and from a bed to a chair (including a wheelchair)?: None Help needed standing up from a  chair using your arms (e.g., wheelchair or bedside chair)?: None Help needed to walk in hospital room?: A Little Help needed climbing 3-5 steps with a railing? : A Little 6 Click Score: 22    End of Session   Activity Tolerance: Patient tolerated treatment well Patient left: in bed;with call bell/phone within reach Nurse Communication: Mobility status;Other (comment) (needs RW and OPPT prior to dc) PT Visit Diagnosis: Difficulty in walking, not elsewhere classified (R26.2);Pain Pain - Right/Left: Left Pain - part of body: Knee    Time: 1700-1749 PT Time Calculation (min) (ACUTE ONLY): 15 min   Charges:   PT Evaluation $PT Eval Low Complexity: [place], PT Acute Rehabilitation Services  Office [phone]   Rexanne Mano 02/24/2022, 12:17 PM

## 2022-02-24 NOTE — TOC Transition Note (Signed)
Transition of Care [facility]) - CM/SW Discharge Note   Patient Details  Name: Derrick Moore MRN: [id number] Date of Birth: July 16, 1966  Transition of [facility]) CM/SW Contact:  Tom-Johnson, Renea Ee, RN Phone Number: 02/24/2022, 4:13 PM   Clinical Narrative:     Patient is scheduled for discharge today. Outpatient info on AVS. Patient to drive self at discharge. No further TOC needs noted.   Final next level of care: OP Rehab Barriers to Discharge: Barriers Resolved   Patient Goals and CMS Choice Patient states their goals for this hospitalization and ongoing recovery are:: To return home CMS Medicare.gov Compare Post Acute Care list provided to:: Patient Choice offered to / list presented to : Patient  Discharge Placement                Patient to be transferred to facility by: Self      Discharge Plan and Services   Discharge Planning Services: CM Consult Post Acute Care Choice:  (Outpatient PT)          DME Arranged: Walker rolling DME Agency: [employer] Date DME Agency Contacted: 02/24/22 Time DME Agency Contacted: [id number] Representative spoke with at DME Agency: Jodell Cipro HH Arranged: NA [place] Agency: NA        Social Determinants of Health ([place]) Interventions     Readmission Risk Interventions    02/24/2022    3:27 PM  Readmission Risk Prevention Plan  Transportation Screening Complete  PCP or Specialist Appt within 3-5 Days Complete  HRI or [place] Complete  Social Work Consult for [place] Planning/Counseling Complete  Palliative Care Screening Not Applicable  Medication Review Press photographer) Referral to Pharmacy

## 2022-02-24 NOTE — TOC Initial Note (Addendum)
Transition of Care [facility]) - Initial/Assessment Note    Patient Details  Name: Derrick Moore MRN: [id number] Date of Birth: 06-19-1966  Transition of Care [facility]) CM/SW Contact:    Tom-Johnson, Renea Ee, RN Phone Number: 02/24/2022, 3:41 PM  Clinical Narrative:                  CM spoke with patient at bedside about needs for post hospital transition. Admitted for Symptomatic Anemia. Hgb on admission was 5.8, 2 units PRBC given and now at 8.2.  Patient is from home alone. Has three children, two out of state. His mother and one brother are supportive with care. Has Hx of ESRD and on PD with [facility] at home. Per report, patient is non compliant with therapy. Currently employed at [employer] in [employer] [place]. Has a cane a t home.  Outpatient PT recommended, patient states he is currently active with [facility] in [facility] and would like to resume therapy there. Info on AVS.  RW ordered from [employer] and [place] to deliver at bedside. PCP is  Saguier, Iris Pert and uses [facility] in [place].   Patient states he drove himself to the hospital and will drive at discharge.  CM will continue to follow as patient progresses with care.  Expected Discharge Plan: OP Rehab Barriers to Discharge: Continued Medical Work up   Patient Goals and CMS Choice Patient states their goals for this hospitalization and ongoing recovery are:: To return home CMS Medicare.gov Compare Post Acute Care list provided to:: Patient Choice offered to / list presented to : Patient  Expected Discharge Plan and Services Expected Discharge Plan: OP Rehab   Discharge Planning Services: CM Consult Post Acute Care Choice:  (Outpatient PT) Living arrangements for the past 2 months: Apartment                 DME Arranged: Walker rolling DME Agency: [employer] Date DME Agency Contacted: 02/24/22 Time DME Agency Contacted: [id number] Representative spoke with at DME Agency: [place]:  NA [place] Agency: NA        Prior Living Arrangements/Services Living arrangements for the past 2 months: [place] with:: Self Patient language and need for interpreter reviewed:: Yes Do you feel safe going back to the place where you live?: Yes      Need for Family Participation in Patient Care: Yes (Comment) Care giver support system in place?: Yes (comment) Current home services: DME Kasandra Knudsen) Criminal Activity/Legal Involvement Pertinent to Current Situation/Hospitalization: No - Comment as needed  Activities of Daily Living Home Assistive Devices/Equipment: Blood pressure cuff, Contact lenses, Scales, Cane (specify quad or straight), Other (Comment) (On peritoneal dialysis - has cycler machine at home) ADL Screening (condition at time of admission) Patient's cognitive ability adequate to safely complete daily activities?: Yes Is the patient deaf or have difficulty hearing?: No Does the patient have difficulty seeing, even when wearing glasses/contacts?: No Does the patient have difficulty concentrating, remembering, or making decisions?: No Patient able to express need for assistance with ADLs?: Yes Does the patient have difficulty dressing or bathing?: No Independently performs ADLs?: Yes (appropriate for developmental age) Does the patient have difficulty walking or climbing stairs?: No Weakness of Legs: None Weakness of Arms/Hands: None  Permission Sought/Granted Permission sought to share information with : Case Manager, Customer service manager, Family Supports Permission granted to share information with : Yes, Verbal Permission Granted  Emotional Assessment Appearance:: Appears stated age Attitude/Demeanor/Rapport: Engaged, Gracious Affect (typically observed): Accepting, Appropriate, Calm, Hopeful, Pleasant Orientation: : Oriented to Self, Oriented to Place, Oriented to  Time, Oriented to Situation Alcohol / Substance Use: Not  Applicable Psych Involvement: No (comment)  Admission diagnosis:  Symptomatic anemia [D64.9] Patient Active Problem List   Diagnosis Date Noted   Symptomatic anemia 02/22/2022   Hypertension    ESRD on dialysis [facility])    Dyslipidemia    Depression    Anemia 02/20/2022   Plantar fasciitis of left foot 01/02/2022   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   Sacroiliac joint dysfunction of right side 12/05/2021   Hip flexor tendinitis, right 02/21/2021   PCP:  [name], PA-C Pharmacy:   [facility], [place] [address] Phone: [phone] Fax: [phone]     Social Determinants of Health (SDOH) Interventions    Readmission Risk Interventions    02/24/2022    3:27 PM  Readmission Risk Prevention Plan  Transportation Screening Complete  PCP or Specialist Appt within 3-5 Days Complete  HRI or [place] Complete  Social Work Consult for [place] Planning/Counseling Complete  Palliative Care Screening Not Applicable  Medication Review Press photographer) Referral to Pharmacy

## 2022-02-24 NOTE — Progress Notes (Signed)
Derrick Moore to be discharged Home per MD order. Discussed prescriptions and follow up appointments with the patient. Prescriptions given to patient; medication list explained in detail. Patient verbalized understanding.  Walker delivered to the room.  Skin clean, dry and intact without evidence of skin break down, no evidence of skin tears noted. IV catheter discontinued intact. Site without signs and symptoms of complications. Dressing and pressure applied. Pt denies pain at the site currently. No complaints noted.  Patient free of lines, drains, and wounds.   An After Visit Summary (AVS) was printed and given to the patient. Patient escorted via wheelchair, and discharged home via private auto.  Amaryllis Dyke, RN

## 2022-02-25 ENCOUNTER — Encounter: Payer: Self-pay | Admitting: *Deleted

## 2022-02-25 ENCOUNTER — Telehealth: Payer: Self-pay | Admitting: *Deleted

## 2022-02-25 ENCOUNTER — Other Ambulatory Visit: Payer: Self-pay

## 2022-02-25 DIAGNOSIS — D649 Anemia, unspecified: Secondary | ICD-10-CM

## 2022-02-25 DIAGNOSIS — Z992 Dependence on renal dialysis: Secondary | ICD-10-CM | POA: Diagnosis not present

## 2022-02-25 DIAGNOSIS — N186 End stage renal disease: Secondary | ICD-10-CM | POA: Diagnosis not present

## 2022-02-25 DIAGNOSIS — I10 Essential (primary) hypertension: Secondary | ICD-10-CM

## 2022-02-25 NOTE — Patient Outreach (Signed)
[facility]) Care Management  02/25/2022  Ji Feldner Marceaux [birth year] [id number]  [place] [facility] [ACO] Patient: Derrick Moore Medicare   Patient called to follow up for needs as patient transitioned home yesterday. Called phone number listed and requested a return call.  Spoke with patient after he returned call HIPAA verified with 2 identifiers and explain [place] Management services.   Explained that the patient qualifies for complex disease management as a benefit with Embedded provider.  Patient agrees to follow up and states he was doing well today.  He states he does PD at home every evening.    Patient will receive post hospital discharge call and will be evaluated for ongoing complex care coordination for high risk admission.     For additional questions or referrals please contact:    Natividad Brood, RN BSN [facility] Liaison  [phone] business mobile phone Toll free office [phone]  Fax number: [phone] [email or web address]  [email or web address]

## 2022-02-25 NOTE — Telephone Encounter (Signed)
Erroneous encounter- please disregard and see TOC call from 02/25/22  Oneta Rack, RN, BSN, [place] RN [place] Care Management [phone]: direct office

## 2022-02-25 NOTE — Patient Outreach (Signed)
  Care Coordination [facility] Note Transition Care Management Unsuccessful Follow-up Telephone Call  Date of discharge and from where:  02/24/22- [facility]  Attempts:  1st Attempt  Reason for unsuccessful TCM follow-up call:  Left voice message  Oneta Rack, RN, BSN, CCRN Alumnus RN [place] Management [phone]: direct office

## 2022-02-26 ENCOUNTER — Encounter: Payer: Self-pay | Admitting: *Deleted

## 2022-02-26 ENCOUNTER — Telehealth: Payer: Self-pay | Admitting: *Deleted

## 2022-02-26 DIAGNOSIS — Z992 Dependence on renal dialysis: Secondary | ICD-10-CM | POA: Diagnosis not present

## 2022-02-26 DIAGNOSIS — N186 End stage renal disease: Secondary | ICD-10-CM | POA: Diagnosis not present

## 2022-02-26 NOTE — Chronic Care Management (AMB) (Signed)
  Care Coordination  Outreach Note  02/26/2022 Name: Derrick Moore MRN: [id number] DOB: 04-14-1966   Care Coordination Outreach Attempts  An unsuccessful telephone outreach was attempted today to offer the patient information about available care coordination services as a benefit of their health plan.   Follow Up Plan:  Additional outreach attempts will be made to offer the patient care coordination information and services.   Encounter Outcome:  No Answer  Julian Hy, [place] Direct Dial: [phone]

## 2022-02-26 NOTE — Chronic Care Management (AMB) (Signed)
  Care Coordination  Note  02/26/2022 Name: Derrick Moore MRN: [id number] DOB: 08-14-1965  Derrick Moore is a 56 y.o. year old male who is a primary care patient of Saguier, Iris Pert. I reached out to Derrick Moore by phone today to offer care coordination services.      [name] was given information about Care Coordination services today including:  The Care Coordination services include support from the care team which includes your Nurse Coordinator, Clinical Social Worker, or Pharmacist.  The Care Coordination team is here to help remove barriers to the health concerns and goals most important to you. Care Coordination services are voluntary and the patient may decline or stop services at any time by request to their care team member.   Patient agreed to services and verbal consent obtained.   Follow up plan: Telephone appointment with care coordination team member scheduled for: 02/28/2022  Julian Hy, [place] Direct Dial: [phone]

## 2022-02-26 NOTE — Patient Outreach (Signed)
  Care Coordination [facility] Note Transition Care Management Unsuccessful Follow-up Telephone Call  Date of discharge and from where:  02/24/22- [place]  Attempts:  2nd Attempt  Reason for unsuccessful TCM follow-up call:  Left voice message  Oneta Rack, RN, BSN, [place] RN [place] Management [phone]: direct office

## 2022-02-27 ENCOUNTER — Encounter: Payer: Self-pay | Admitting: *Deleted

## 2022-02-27 ENCOUNTER — Telehealth: Payer: Self-pay | Admitting: *Deleted

## 2022-02-27 ENCOUNTER — Other Ambulatory Visit: Payer: Self-pay | Admitting: Medical

## 2022-02-27 DIAGNOSIS — Z992 Dependence on renal dialysis: Secondary | ICD-10-CM | POA: Diagnosis not present

## 2022-02-27 DIAGNOSIS — N186 End stage renal disease: Secondary | ICD-10-CM | POA: Diagnosis not present

## 2022-02-27 NOTE — Patient Outreach (Signed)
  Care Coordination [facility] Note Transition Care Management Follow-up Telephone Call Date of discharge and from where: 02/24/22 Derrick Moore How have you been since you were released from the hospital? "I'm doing overall better; I'm getting there where I feel more like my normal self" Any questions or concerns? No; continues performing self peritoneal dialysis at home- states "going fine"  Items Reviewed: Did the pt receive and understand the discharge instructions provided? Yes  Medications obtained and verified? Yes  Other?  N/A Any new allergies since your discharge? No  Dietary orders reviewed? Yes Do you have support at home? Yes   Home Care and Equipment/Supplies: Were home health services ordered? no If so, what is the name of the agency? N/A  Has the agency set up a time to come to the patient's home? not applicable Were any new equipment or medical supplies ordered?  Yes: rolling walker- obtained prior to hospital discharge What is the name of the medical supply agency? [employer] Were you able to get the supplies/equipment? yes Do you have any questions related to the use of the equipment or supplies? No  Functional Questionnaire: (I = Independent and D = Dependent) ADLs: I  Bathing/Dressing- I  Meal Prep- I  Eating- I  Maintaining continence- I  Transferring/Ambulation- I  Managing Meds- I  Follow up appointments reviewed:  PCP Hospital f/u appt confirmed? No  Scheduled to see - on - @ - encouraged to make prompt PCP appointment today: patient states he will do: verified prior to note signing that PCP appointment scheduled by patient after our call; PCP office visit on 03/04/22 at 11:00 am [facility] f/u appt confirmed? No  Scheduled to see N/A on - @ - Are transportation arrangements needed? No  If their condition worsens, is the pt aware to call PCP or go to the Emergency Dept.? Yes Was the patient provided with contact information for the PCP's office or ED?  Yes Was to pt encouraged to call back with questions or concerns? Yes  SDOH assessments and interventions completed:   Yes  Care Coordination Interventions Activated:  Yes   Care Coordination Interventions:  PCP follow up appointment requested    Encounter Outcome:  Pt. Visit Completed    Oneta Rack, RN, BSN, [place] RN [place] Management [phone]: direct office

## 2022-02-28 ENCOUNTER — Ambulatory Visit: Payer: Self-pay

## 2022-02-28 DIAGNOSIS — N186 End stage renal disease: Secondary | ICD-10-CM | POA: Diagnosis not present

## 2022-02-28 DIAGNOSIS — Z992 Dependence on renal dialysis: Secondary | ICD-10-CM | POA: Diagnosis not present

## 2022-02-28 NOTE — Patient Outreach (Signed)
  Care Coordination   Initial Visit Note   02/28/2022 Name: CADELL GABRIELSON MRN: [id number] DOB: Apr 20, 1966  Etter Sjogren Krikorian is a 56 y.o. year old male who sees Saguier, Percell Miller, [place] for primary care. I spoke with  Etter Sjogren Mawson by phone today  What matters to the patients health and wellness today?  Reports right knee hurt at work and has R knee pain. States does not hurt while resting, but unable to describe or rate pain with ambulation. Patient reports currently using walker. He reports he has been referred to outpatient physical therapy and will have initial visit today. Patient has a post hospital follow up visit scheduled with primary care provider for 03/04/22.    Goals Addressed             This Visit's Progress    Patient Stated: right knee pain       Care Coordination Interventions: Reviewed provider established plan for pain management Discussed importance of adherence to all scheduled medical appointments Counseled on the importance of reporting any/all new or changed pain symptoms or management strategies to pain management provider Assessed social determinant of health barriers Encouraged to complete home work exercises recommended by outpatient therapist          SDOH assessments and interventions completed:  Yes    Care Coordination Interventions Activated:  Yes  Care Coordination Interventions:  Yes, provided   Follow up plan: Follow up call scheduled for 03/14/22    Encounter Outcome:  Pt. Visit Completed   Thea Silversmith, RN, MSN, BSN, [place] Coordinator [phone]

## 2022-03-01 DIAGNOSIS — Z992 Dependence on renal dialysis: Secondary | ICD-10-CM | POA: Diagnosis not present

## 2022-03-01 DIAGNOSIS — N186 End stage renal disease: Secondary | ICD-10-CM | POA: Diagnosis not present

## 2022-03-02 DIAGNOSIS — N186 End stage renal disease: Secondary | ICD-10-CM | POA: Diagnosis not present

## 2022-03-02 DIAGNOSIS — Z992 Dependence on renal dialysis: Secondary | ICD-10-CM | POA: Diagnosis not present

## 2022-03-03 DIAGNOSIS — M25552 Pain in left hip: Secondary | ICD-10-CM | POA: Diagnosis not present

## 2022-03-03 DIAGNOSIS — M25551 Pain in right hip: Secondary | ICD-10-CM | POA: Diagnosis not present

## 2022-03-03 DIAGNOSIS — N186 End stage renal disease: Secondary | ICD-10-CM | POA: Diagnosis not present

## 2022-03-03 DIAGNOSIS — M79672 Pain in left foot: Secondary | ICD-10-CM | POA: Diagnosis not present

## 2022-03-03 DIAGNOSIS — Z992 Dependence on renal dialysis: Secondary | ICD-10-CM | POA: Diagnosis not present

## 2022-03-03 DIAGNOSIS — M5459 Other low back pain: Secondary | ICD-10-CM | POA: Diagnosis not present

## 2022-03-04 ENCOUNTER — Inpatient Hospital Stay: Payer: Medicare HMO | Admitting: Medical

## 2022-03-04 DIAGNOSIS — Z992 Dependence on renal dialysis: Secondary | ICD-10-CM | POA: Diagnosis not present

## 2022-03-04 DIAGNOSIS — N186 End stage renal disease: Secondary | ICD-10-CM | POA: Diagnosis not present

## 2022-03-05 ENCOUNTER — Ambulatory Visit (INDEPENDENT_AMBULATORY_CARE_PROVIDER_SITE_OTHER): Payer: Medicare HMO | Admitting: Medical

## 2022-03-05 ENCOUNTER — Telehealth: Payer: Self-pay | Admitting: Medical

## 2022-03-05 VITALS — BP 151/103 | HR 82 | Temp 98.0°F | Resp 16 | Ht 70.0 in | Wt 149.6 lb

## 2022-03-05 DIAGNOSIS — E538 Deficiency of other specified B group vitamins: Secondary | ICD-10-CM

## 2022-03-05 DIAGNOSIS — Z8673 Personal history of transient ischemic attack (TIA), and cerebral infarction without residual deficits: Secondary | ICD-10-CM

## 2022-03-05 DIAGNOSIS — Z992 Dependence on renal dialysis: Secondary | ICD-10-CM | POA: Diagnosis not present

## 2022-03-05 DIAGNOSIS — N186 End stage renal disease: Secondary | ICD-10-CM | POA: Diagnosis not present

## 2022-03-05 DIAGNOSIS — M25562 Pain in left knee: Secondary | ICD-10-CM | POA: Diagnosis not present

## 2022-03-05 DIAGNOSIS — I10 Essential (primary) hypertension: Secondary | ICD-10-CM | POA: Diagnosis not present

## 2022-03-05 DIAGNOSIS — E875 Hyperkalemia: Secondary | ICD-10-CM | POA: Diagnosis not present

## 2022-03-05 DIAGNOSIS — D649 Anemia, unspecified: Secondary | ICD-10-CM | POA: Diagnosis not present

## 2022-03-05 DIAGNOSIS — M25551 Pain in right hip: Secondary | ICD-10-CM | POA: Diagnosis not present

## 2022-03-05 MED ORDER — CLOPIDOGREL BISULFATE 75 MG PO TABS: 75.0000 mg | ORAL_TABLET | Freq: Every day | ORAL | 3 refills | Status: DC

## 2022-03-05 NOTE — Telephone Encounter (Signed)
I put in labs for pt to get done yesterday. Looks like he did not do labs? Why not? Will you call and ask pt.

## 2022-03-05 NOTE — Progress Notes (Signed)
   Subjective:    Patient ID: Derrick Moore, male    DOB: Dec 20, 1965, 56 y.o.   MRN: [id number]  HPI  02-24-2022.  Recommendations at discharge:     Follow up with PCP in 1-2 weeks Continue with outpatient PT as scheduled   Discharge Diagnoses: Principal Problem:   Symptomatic anemia Active Problems:   Hypertension   ESRD on dialysis [facility])   Dyslipidemia   Depression  Hospital Course: 56 y.o. male with medical history significant of ESRD on peritoneal dialysis, hypertension and dyslipidemia who presented with anemia and hyperkalemia. Patient was evaluated in the ED on 07/27 for follow up neck pain that was diagnosed on 07/19 and had prednisone taper prescribed. On 07/27 patient persistent left sided neck pain. He also reported worsening lower extremity edema. He was found uremic, hyperkalemic and uremic with recommendations for admission. Patient declined hospitalization and stated will come back to the ED.     At home patient with worsening generalized weakness and dizziness, easy fatigability and somnolence.  Persistent symptoms, worse with exertion and no associated melena, hematochezia or hematuria. No improving factors.  Confirms progressive and worsening lower extremity edema but not orthopnea or PND.  He still making urine. Denies any difficulty performing HD at home. No abdominal pain.    Because worsening symptoms he decided to come back to the ED.    Assessment and Plan: * Symptomatic anemia Likely anemia of chronic renal disease.  No clinical signs of acute blood loss anemia.  Hgb improved with 2 units PRBC's and remained stable, with hgb 8.3 at time of dc Iron and b12 within normal limits. Folate borderline low at 5.7, supplement prescribed Recheck cbc in aM  Hypertension Continue home lisinopril, meds confirmed with pharmacy.    ESRD on dialysis ([place]) Hyperkalemia, hyperchloremia and non anion gap metabolic acidosis  Nephrology following, underwent PD in  hospital   Dyslipidemia Continue statin therapy.   Depression Continue with sertraline.    L knee pain -Recent L knee xray reviewed, no fracture noted -No effusion on exam -Seen by PT with recs for outpatient PT         Consultants: Nephrology Procedures performed:   Disposition: Home Diet recommendation:  Renal diet DISCHARGE MEDICATION:    On review the hb was at 5.8 level. Before admission. Since dc not dizzy any more and good energy.   Pt went to ED initially for knee and describes subtle  Pt states had left knee pain first before knee started to hurt. Has had left hip/pelvis area pain sine June. About one month ago knee started to hurt.   No black or bloody stools. No abdomen pain. In hospital did not not have gi consult.   Review of Systems     Objective:   Physical Exam        Assessment & Plan:   Patient Instructions  Htn- bp high today but did not take amlodipine, lisinopril or labetolol.  Hyperkalemia- repeat cmp today assess k level.  Cbc today to assess anemia level stable.   Hx of stoke and on plavix since 2012. Rx sent today.  Knee pain. Placed referral to sports med MD.  [name]- please take bp meds today.   Low folate. Recheck today.  For ckd continue daily dialysis. Please see nephroloigst next week  Follow up with me to be determined after lab review.

## 2022-03-05 NOTE — Patient Instructions (Addendum)
Htn- bp high today but did not take amlodipine, lisinopril or labetolol.  Hyperkalemia- repeat cmp today assess k level.  Cbc today to assess anemia level stable.   Hx of stoke and on plavix since 2012. Rx sent today.  Knee pain. Placed referral to sports med MD.  Derrick Moore- please take bp meds today.   Low folate. Recheck today.  For ckd continue daily dialysis. Please see nephroloigst next week  Follow up with me to be determined after lab review.

## 2022-03-06 ENCOUNTER — Other Ambulatory Visit (INDEPENDENT_AMBULATORY_CARE_PROVIDER_SITE_OTHER): Payer: Medicare HMO

## 2022-03-06 DIAGNOSIS — N186 End stage renal disease: Secondary | ICD-10-CM

## 2022-03-06 DIAGNOSIS — M79672 Pain in left foot: Secondary | ICD-10-CM | POA: Diagnosis not present

## 2022-03-06 DIAGNOSIS — M25551 Pain in right hip: Secondary | ICD-10-CM | POA: Diagnosis not present

## 2022-03-06 DIAGNOSIS — Z992 Dependence on renal dialysis: Secondary | ICD-10-CM | POA: Diagnosis not present

## 2022-03-06 DIAGNOSIS — E785 Hyperlipidemia, unspecified: Secondary | ICD-10-CM

## 2022-03-06 DIAGNOSIS — M5459 Other low back pain: Secondary | ICD-10-CM | POA: Diagnosis not present

## 2022-03-06 DIAGNOSIS — M25552 Pain in left hip: Secondary | ICD-10-CM | POA: Diagnosis not present

## 2022-03-06 LAB — LIPID PANEL
Cholesterol: 163 mg/dL (ref 0–200)
HDL: 61.6 mg/dL (ref >39.00)
LDL Cholesterol: 87 mg/dL (ref 0–99)
NonHDL: 101.56
Total CHOL/HDL Ratio: 3
Triglycerides: 75 mg/dL (ref 0.0–149.0)
VLDL: 15 mg/dL (ref 0.0–40.0)

## 2022-03-06 NOTE — Telephone Encounter (Signed)
Called Pt about his lab work  and he stated he was coming in this morning for the labs

## 2022-03-07 ENCOUNTER — Telehealth: Payer: Self-pay | Admitting: *Deleted

## 2022-03-07 DIAGNOSIS — Z992 Dependence on renal dialysis: Secondary | ICD-10-CM | POA: Diagnosis not present

## 2022-03-07 DIAGNOSIS — N186 End stage renal disease: Secondary | ICD-10-CM | POA: Diagnosis not present

## 2022-03-07 NOTE — Addendum Note (Signed)
Addended by: Kem Boroughs D on: 03/07/2022 04:50 PM   Modules accepted: Orders

## 2022-03-07 NOTE — Telephone Encounter (Signed)
Received a message from provider about patient not getting lab work done.  Looks like he did not notify lab that he would be coming back and when he did come back lab dropped a future order from March.    Spoke with patient and advised that we need him to come back in today if possible.  Patient stated that he would come back in today.

## 2022-03-08 DIAGNOSIS — N186 End stage renal disease: Secondary | ICD-10-CM | POA: Diagnosis not present

## 2022-03-08 DIAGNOSIS — Z992 Dependence on renal dialysis: Secondary | ICD-10-CM | POA: Diagnosis not present

## 2022-03-09 DIAGNOSIS — Z992 Dependence on renal dialysis: Secondary | ICD-10-CM | POA: Diagnosis not present

## 2022-03-09 DIAGNOSIS — N186 End stage renal disease: Secondary | ICD-10-CM | POA: Diagnosis not present

## 2022-03-10 ENCOUNTER — Other Ambulatory Visit (INDEPENDENT_AMBULATORY_CARE_PROVIDER_SITE_OTHER): Payer: Medicare HMO

## 2022-03-10 DIAGNOSIS — E538 Deficiency of other specified B group vitamins: Secondary | ICD-10-CM

## 2022-03-10 DIAGNOSIS — E875 Hyperkalemia: Secondary | ICD-10-CM | POA: Diagnosis not present

## 2022-03-10 DIAGNOSIS — N186 End stage renal disease: Secondary | ICD-10-CM | POA: Diagnosis not present

## 2022-03-10 DIAGNOSIS — D649 Anemia, unspecified: Secondary | ICD-10-CM | POA: Diagnosis not present

## 2022-03-10 DIAGNOSIS — Z992 Dependence on renal dialysis: Secondary | ICD-10-CM | POA: Diagnosis not present

## 2022-03-10 LAB — CBC WITH DIFFERENTIAL/PLATELET
Basophils Absolute: 0.1 10*3/uL (ref 0.0–0.1)
Basophils Relative: 0.9 % (ref 0.0–3.0)
Eosinophils Absolute: 0.2 10*3/uL (ref 0.0–0.7)
Eosinophils Relative: 3.2 % (ref 0.0–5.0)
HCT: 25.7 % — ABNORMAL LOW (ref 39.0–52.0)
Hemoglobin: 8.2 g/dL — ABNORMAL LOW (ref 13.0–17.0)
Lymphocytes Relative: 13.5 % (ref 12.0–46.0)
Lymphs Abs: 0.8 10*3/uL (ref 0.7–4.0)
MCHC: 31.9 g/dL (ref 30.0–36.0)
MCV: 94.3 fl (ref 78.0–100.0)
Monocytes Absolute: 0.4 10*3/uL (ref 0.1–1.0)
Monocytes Relative: 7.8 % (ref 3.0–12.0)
Neutro Abs: 4.3 10*3/uL (ref 1.4–7.7)
Neutrophils Relative %: 74.6 % (ref 43.0–77.0)
Platelets: 225 10*3/uL (ref 150.0–400.0)
RBC: 2.73 Mil/uL — ABNORMAL LOW (ref 4.22–5.81)
RDW: 21.8 % — ABNORMAL HIGH (ref 11.5–15.5)
WBC: 5.7 10*3/uL (ref 4.0–10.5)

## 2022-03-10 LAB — COMPREHENSIVE METABOLIC PANEL
ALT: 10 U/L (ref 0–53)
AST: 12 U/L (ref 0–37)
Albumin: 3.2 g/dL — ABNORMAL LOW (ref 3.5–5.2)
Alkaline Phosphatase: 126 U/L — ABNORMAL HIGH (ref 39–117)
BUN: 86 mg/dL (ref 6–23)
CO2: 16 mEq/L — ABNORMAL LOW (ref 19–32)
Calcium: 6.8 mg/dL — ABNORMAL LOW (ref 8.4–10.5)
Chloride: 112 mEq/L (ref 96–112)
Creatinine, Ser: 14.83 mg/dL (ref 0.40–1.50)
GFR: 3.36 mL/min — CL (ref >60.00)
Glucose, Bld: 93 mg/dL (ref 70–99)
Potassium: 5.6 mEq/L — ABNORMAL HIGH (ref 3.5–5.1)
Sodium: 142 mEq/L (ref 135–145)
Total Bilirubin: 0.3 mg/dL (ref 0.2–1.2)
Total Protein: 5.7 g/dL — ABNORMAL LOW (ref 6.0–8.3)

## 2022-03-10 LAB — FOLATE: Folate: 10.5 ng/mL (ref >5.9)

## 2022-03-10 MED ORDER — SODIUM POLYSTYRENE SULFONATE 15 GM/60ML PO SUSP
ORAL | 0 refills | Status: DC
Start: 2022-03-10 — End: 2023-09-24
  Filled 2022-03-10: qty 240, 4d supply, fill #0

## 2022-03-10 NOTE — Addendum Note (Signed)
Addended by: Manuela Schwartz on: 03/10/2022 02:27 PM   Modules accepted: Orders

## 2022-03-10 NOTE — Addendum Note (Signed)
Addended by: Anabel Halon on: 03/10/2022 08:38 PM   Modules accepted: Orders

## 2022-03-11 ENCOUNTER — Ambulatory Visit: Payer: Medicare HMO | Admitting: Family Medicine

## 2022-03-11 ENCOUNTER — Other Ambulatory Visit (HOSPITAL_BASED_OUTPATIENT_CLINIC_OR_DEPARTMENT_OTHER): Payer: Self-pay

## 2022-03-11 DIAGNOSIS — Z992 Dependence on renal dialysis: Secondary | ICD-10-CM | POA: Diagnosis not present

## 2022-03-11 DIAGNOSIS — N186 End stage renal disease: Secondary | ICD-10-CM | POA: Diagnosis not present

## 2022-03-12 ENCOUNTER — Emergency Department (HOSPITAL_BASED_OUTPATIENT_CLINIC_OR_DEPARTMENT_OTHER)
Admission: EM | Admit: 2022-03-12 | Discharge: 2022-03-12 | Disposition: A | Discharge status: Home / Self Care | Payer: Medicare HMO | Attending: Emergency Medicine | Admitting: Emergency Medicine

## 2022-03-12 ENCOUNTER — Encounter (HOSPITAL_BASED_OUTPATIENT_CLINIC_OR_DEPARTMENT_OTHER): Payer: Self-pay | Admitting: Emergency Medicine

## 2022-03-12 DIAGNOSIS — Z992 Dependence on renal dialysis: Secondary | ICD-10-CM | POA: Diagnosis not present

## 2022-03-12 DIAGNOSIS — M545 Low back pain, unspecified: Secondary | ICD-10-CM | POA: Diagnosis not present

## 2022-03-12 DIAGNOSIS — Z7902 Long term (current) use of antithrombotics/antiplatelets: Secondary | ICD-10-CM | POA: Diagnosis not present

## 2022-03-12 DIAGNOSIS — M5432 Sciatica, left side: Secondary | ICD-10-CM | POA: Diagnosis not present

## 2022-03-12 DIAGNOSIS — M5442 Lumbago with sciatica, left side: Secondary | ICD-10-CM | POA: Diagnosis not present

## 2022-03-12 DIAGNOSIS — N186 End stage renal disease: Secondary | ICD-10-CM | POA: Diagnosis not present

## 2022-03-12 MED ORDER — ACETAMINOPHEN 500 MG PO TABS
1000.0000 mg | ORAL_TABLET | Freq: Once | ORAL | Status: AC
  Administered 2022-03-12: 1000 mg via ORAL
  Filled 2022-03-12: qty 2

## 2022-03-12 MED ORDER — MORPHINE SULFATE 15 MG PO TABS: 15.0000 mg | ORAL_TABLET | ORAL | 0 refills | Status: DC | PRN

## 2022-03-12 MED ORDER — OXYCODONE HCL 5 MG PO TABS: 5.0000 mg | ORAL_TABLET | Freq: Once | ORAL | Status: DC

## 2022-03-12 MED ORDER — DIAZEPAM 2 MG PO TABS: 2.0000 mg | ORAL_TABLET | Freq: Once | ORAL | Status: DC

## 2022-03-12 MED ORDER — KETOROLAC TROMETHAMINE 15 MG/ML IJ SOLN
15.0000 mg | Freq: Once | INTRAMUSCULAR | Status: AC
Start: 2022-03-12 — End: 2022-03-12
  Administered 2022-03-12: 15 mg via INTRAMUSCULAR
  Filled 2022-03-12: qty 1

## 2022-03-12 NOTE — Discharge Instructions (Signed)

## 2022-03-12 NOTE — ED Notes (Signed)
Pt drove himself to ED. Calling friend/family to see if he can get a ride after receiving medication.

## 2022-03-12 NOTE — ED Provider Notes (Signed)
[facility] DEPARTMENT Provider Note   CSN: [id number] Arrival date & time: 03/12/22  1931     History  Chief Complaint  Patient presents with   Back Pain    Derrick Moore is a 56 y.o. male.  56 yo M with a chief complaints of left-sided low back pain.  This radiates down to the knee.  This been going on mostly today.  Has been having trouble with his back off and on for some time.  He gets dialysis on Monday Wednesday and Friday and had a full session this morning.  He denies any trauma to his back denies loss of bowel or bladder denies loss of perirectal sensation denies numbness or weakness to the leg.  He denies fevers.  Denies injection.  He has an appointment with a sports medicine physician tomorrow.   Back Pain      Home Medications Prior to Admission medications   Medication Sig Start Date End Date Taking? Authorizing Provider  morphine (MSIR) 15 MG tablet Take 1 tablet (15 mg total) by mouth every 4 (four) hours as needed for up to 1 dose for severe pain. 03/12/22  Yes Deno Etienne, DO  acetaminophen (TYLENOL) 325 MG tablet Take 2 tablets (650 mg total) by mouth every 6 (six) hours as needed for moderate pain. 02/24/22   Donne Hazel, MD  amLODipine (NORVASC) 5 MG tablet Take 5 mg by mouth at bedtime.    [provider]  cinacalcet (SENSIPAR) 60 MG tablet Take 60 mg by mouth daily.    [provider]  clopidogrel (PLAVIX) 75 MG tablet Take 1 tablet (75 mg total) by mouth daily. 03/05/22   Saguier, Percell Miller, PA-C  folic acid (FOLVITE) 1 MG tablet Take 1 tablet (1 mg total) by mouth daily. 02/25/22 03/27/22  Donne Hazel, MD  labetalol (NORMODYNE) 200 MG tablet Take 200 mg by mouth 2 (two) times daily.    [provider]  lidocaine (LIDODERM) 5 % Place 1 patch onto the skin daily. Remove & Discard patch within 12 hours or as directed by MD 02/12/22   Lennice Sites, DO  lisinopril (ZESTRIL) 30 MG tablet Take 30 mg by mouth at  bedtime. 07/04/20   [provider]  methocarbamol (ROBAXIN) 500 MG tablet Take 1 tablet (500 mg total) by mouth every 8 (eight) hours as needed for muscle spasms. 01/19/22   Long, Wonda Olds, MD  naproxen (NAPROSYN) 500 MG tablet Take 1 tablet by mouth twice daily 02/27/22   Saguier, Percell Miller, PA-C  sodium polystyrene (KAYEXALATE) 15 GM/60ML suspension Take 60 ml by mouth daily for 2 days. 03/10/22   Saguier, Percell Miller, PA-C      Allergies    Patient has no known allergies.    Review of Systems   Review of Systems  Musculoskeletal:  Positive for back pain.    Physical Exam Updated Vital Signs BP (!) 158/108   Pulse 100   Temp 98.6 F (37 C) (Oral)   Resp 16   Ht 5' 10.5" (1.791 m)   Wt 68 kg   SpO2 98%   BMI 21.22 kg/m  Physical Exam Vitals and nursing note reviewed.  Constitutional:      Appearance: He is well-developed.  HENT:     Head: Normocephalic and atraumatic.  Eyes:     Pupils: Pupils are equal, round, and reactive to light.  Neck:     Vascular: No JVD.  Cardiovascular:     Rate and  Rhythm: Normal rate and regular rhythm.     Heart sounds: No murmur heard.    No friction rub. No gallop.  Pulmonary:     Effort: No respiratory distress.     Breath sounds: No wheezing.  Abdominal:     General: There is no distension.     Tenderness: There is no abdominal tenderness. There is no guarding or rebound.  Musculoskeletal:        General: Normal range of motion.     Cervical back: Normal range of motion and neck supple.     Comments: Pulse motor and sensation intact to the left lower extremity.  Negative straight leg raise test.  Reflexes 2+ and equal bilaterally.  No clonus  Skin:    Coloration: Skin is not pale.     Findings: No rash.  Neurological:     Mental Status: He is alert and oriented to person, place, and time.  Psychiatric:        Behavior: Behavior normal.     ED Results / Procedures / Treatments   Labs (all labs ordered are listed, but only  abnormal results are displayed) Labs Reviewed - No data to display  EKG None  Radiology No results found.  Procedures Procedures    Medications Ordered in ED Medications  acetaminophen (TYLENOL) tablet 1,000 mg (has no administration in time range)  ketorolac (TORADOL) 15 MG/ML injection 15 mg (has no administration in time range)  oxyCODONE (Oxy IR/ROXICODONE) immediate release tablet 5 mg (has no administration in time range)  diazepam (VALIUM) tablet 2 mg (has no administration in time range)    ED Course/ Medical Decision Making/ A&P                           Medical Decision Making Risk OTC drugs. Prescription drug management.   56 yo M with chief complaint of left-sided low back pain.  He has no red flags.  Has benign exam.  We will treat as musculoskeletal.  And has a known history of the same.  Is currently physical therapy.  Has sports medicine follow-up tomorrow.  9:11 PM:  I have discussed the diagnosis/risks/treatment options with the patient.  Evaluation and diagnostic testing in the emergency department does not suggest an emergent condition requiring admission or immediate intervention beyond what has been performed at this time.  They will follow up with  PCP, sports med. We also discussed returning to the ED immediately if new or worsening sx occur. We discussed the sx which are most concerning (e.g., sudden worsening pain, fever, inability to tolerate by mouth) that necessitate immediate return. Medications administered to the patient during their visit and any new prescriptions provided to the patient are listed below.  Medications given during this visit Medications  acetaminophen (TYLENOL) tablet 1,000 mg (has no administration in time range)  ketorolac (TORADOL) 15 MG/ML injection 15 mg (has no administration in time range)  oxyCODONE (Oxy IR/ROXICODONE) immediate release tablet 5 mg (has no administration in time range)  diazepam (VALIUM) tablet 2 mg (has  no administration in time range)     The patient appears reasonably screen and/or stabilized for discharge and I doubt any other medical condition or other [facility] requiring further screening, evaluation, or treatment in the ED at this time prior to discharge.          Final Clinical Impression(s) / ED Diagnoses Final diagnoses:  Sciatica of left side    Rx /  DC Orders ED Discharge Orders          Ordered    morphine (MSIR) 15 MG tablet  Every 4 hours PRN        03/12/22 2110              Deno Etienne, DO 03/12/22 2111

## 2022-03-12 NOTE — ED Triage Notes (Signed)
Pt reports LT side back pain since yesterday

## 2022-03-13 ENCOUNTER — Other Ambulatory Visit (HOSPITAL_BASED_OUTPATIENT_CLINIC_OR_DEPARTMENT_OTHER): Payer: Self-pay

## 2022-03-13 ENCOUNTER — Encounter: Payer: Self-pay | Admitting: Family Medicine

## 2022-03-13 ENCOUNTER — Ambulatory Visit (INDEPENDENT_AMBULATORY_CARE_PROVIDER_SITE_OTHER): Payer: Medicare HMO | Admitting: Family Medicine

## 2022-03-13 VITALS — BP 154/90 | Ht 70.5 in | Wt 150.0 lb

## 2022-03-13 DIAGNOSIS — M5416 Radiculopathy, lumbar region: Secondary | ICD-10-CM | POA: Diagnosis not present

## 2022-03-13 DIAGNOSIS — N186 End stage renal disease: Secondary | ICD-10-CM | POA: Diagnosis not present

## 2022-03-13 DIAGNOSIS — Z992 Dependence on renal dialysis: Secondary | ICD-10-CM | POA: Diagnosis not present

## 2022-03-13 MED ORDER — HYDROCODONE-ACETAMINOPHEN 5-325 MG PO TABS
1.0000 | ORAL_TABLET | Freq: Three times a day (TID) | ORAL | 0 refills | Status: DC | PRN
  Filled 2022-03-13: qty 15, 5d supply, fill #0

## 2022-03-13 NOTE — Patient Instructions (Signed)
Good to see you Please use heat  Please try the exercise  Please use the pain medicine as needed  Please send me a message in MyChart with any questions or updates.  Please see me back in 1-2 weeks.   --Dr. Raeford Razor

## 2022-03-13 NOTE — Assessment & Plan Note (Signed)
Acutely occurring.  Most consistent with a nerve impingement.  Intermittent over the past 3 months.  CT scan of the abdomen pelvis was reassuring. -Counseled on home exercise therapy and supportive care. -Norco. -Counseled on naproxen. -Could consider further imaging

## 2022-03-13 NOTE — Progress Notes (Signed)
  Derrick Moore - 56 y.o. male MRN [id number]  Date of birth: 28-Apr-1966  SUBJECTIVE:  Including CC & ROS.  No chief complaint on file.   Derrick Moore is a 56 y.o. male that is presenting with acute left-sided low back and radicular type pain.  He is having trouble putting weight on the left side.  He has limited strength with hip flexion.  Pain has been intermittent over the past 3 months.  Denies any injury inciting event.  Has history of appendectomy and placement of his peritoneal tubing...  Review of the emergency note from 6/25 shows she was seen for sciatica the left side. Review of the emergency note from 7/19 shows she was seen with low back pain. Review of the emergency note from 8/16 shows he was seen with low back pain and left-sided radicular pain. Independent review of the CT abdomen pelvis from 7/29 shows mild degenerative changes of the lumbar spine.  Review of Systems See HPI   HISTORY: Past Medical, Surgical, Social, and Family History Reviewed & Updated per EMR.   Pertinent Historical Findings include:  Past Medical History:  Diagnosis Date   Dialysis patient [facility])    Hyperlipidemia    Hypertension    Renal failure    Renal failure    Stroke [facility])     History reviewed. No pertinent surgical history.   PHYSICAL EXAM:  VS: BP (!) 154/90 (BP Location: Left Arm, Patient Position: Sitting)   Ht 5' 10.5" (1.791 m)   Wt 150 lb (68 kg)   BMI 21.22 kg/m  Physical Exam Gen: NAD, alert, cooperative with exam, well-appearing MSK:  Neurovascularly intact       ASSESSMENT & PLAN:   Lumbar radiculopathy Acutely occurring.  Most consistent with a nerve impingement.  Intermittent over the past 3 months.  CT scan of the abdomen pelvis was reassuring. -Counseled on home exercise therapy and supportive care. -Norco. -Counseled on naproxen. -Could consider further imaging

## 2022-03-14 ENCOUNTER — Ambulatory Visit: Payer: Self-pay

## 2022-03-14 DIAGNOSIS — Z992 Dependence on renal dialysis: Secondary | ICD-10-CM | POA: Diagnosis not present

## 2022-03-14 DIAGNOSIS — N186 End stage renal disease: Secondary | ICD-10-CM | POA: Diagnosis not present

## 2022-03-14 NOTE — Patient Outreach (Signed)
  Care Coordination   Follow Up Visit Note   03/14/2022 Name: SAAHIR PRUDE MRN: [id number] DOB: 1966/06/09  Etter Sjogren [place] is a 56 y.o. year old male who sees Saguier, Percell Miller, [place] for primary care. I spoke with  Etter Sjogren Munar by phone today  What matters to the patients health and wellness today?  Reports R knee pain is gone, but states now has pain to left hip. He reports no pain at rest but with activity getting up can be anywhere from "7-9". He continues to have outpatient physical therapy. Patient is being followed by Dr. Rennis [facility].    Goals Addressed             This Visit's Progress    decreased pain       Care Coordination Interventions: Reviewed provider established plan for pain management Discussed importance of adherence to all scheduled medical appointments Reviewed with patient prescribed pharmacological and nonpharmacological pain relief strategies Encouraged to complete home work exercises recommended by outpatient therapist Encouraged to try heat as reccommended by provider Upcoming appointments reviewed Encouraged to contact primary care provider to schedule annual wellness visit.      COMPLETED: Patient Stated: right knee pain       Care Coordination Interventions: Reviewed provider established plan for pain management Discussed importance of adherence to all scheduled medical appointments Counseled on the importance of reporting any/all new or changed pain symptoms or management strategies to pain management provider Assessed social determinant of health barriers Encouraged to complete home work exercises recommended by outpatient therapist          SDOH assessments and interventions completed:  No     Care Coordination Interventions Activated:  Yes  Care Coordination Interventions:  Yes, provided   Follow up plan: Follow up call scheduled for 04/04/22    Encounter Outcome:  Pt. Visit Completed   Thea Silversmith, RN,  MSN, BSN, [place] Coordinator [phone]

## 2022-03-15 DIAGNOSIS — Z992 Dependence on renal dialysis: Secondary | ICD-10-CM | POA: Diagnosis not present

## 2022-03-15 DIAGNOSIS — N186 End stage renal disease: Secondary | ICD-10-CM | POA: Diagnosis not present

## 2022-03-16 ENCOUNTER — Encounter (HOSPITAL_BASED_OUTPATIENT_CLINIC_OR_DEPARTMENT_OTHER): Payer: Self-pay | Admitting: Emergency Medicine

## 2022-03-16 ENCOUNTER — Emergency Department (HOSPITAL_BASED_OUTPATIENT_CLINIC_OR_DEPARTMENT_OTHER): Payer: Medicare HMO

## 2022-03-16 ENCOUNTER — Emergency Department (HOSPITAL_BASED_OUTPATIENT_CLINIC_OR_DEPARTMENT_OTHER)
Admission: EM | Admit: 2022-03-16 | Discharge: 2022-03-16 | Disposition: A | Discharge status: Home / Self Care | Payer: Medicare HMO | Attending: Emergency Medicine | Admitting: Emergency Medicine

## 2022-03-16 DIAGNOSIS — M7989 Other specified soft tissue disorders: Secondary | ICD-10-CM | POA: Diagnosis not present

## 2022-03-16 DIAGNOSIS — R1110 Vomiting, unspecified: Secondary | ICD-10-CM | POA: Insufficient documentation

## 2022-03-16 DIAGNOSIS — N186 End stage renal disease: Secondary | ICD-10-CM | POA: Insufficient documentation

## 2022-03-16 DIAGNOSIS — Z79899 Other long term (current) drug therapy: Secondary | ICD-10-CM | POA: Insufficient documentation

## 2022-03-16 DIAGNOSIS — Z7901 Long term (current) use of anticoagulants: Secondary | ICD-10-CM | POA: Insufficient documentation

## 2022-03-16 DIAGNOSIS — N2889 Other specified disorders of kidney and ureter: Secondary | ICD-10-CM | POA: Diagnosis not present

## 2022-03-16 DIAGNOSIS — R531 Weakness: Secondary | ICD-10-CM | POA: Diagnosis not present

## 2022-03-16 DIAGNOSIS — I700 Atherosclerosis of aorta: Secondary | ICD-10-CM | POA: Diagnosis not present

## 2022-03-16 DIAGNOSIS — I120 Hypertensive chronic kidney disease with stage 5 chronic kidney disease or end stage renal disease: Secondary | ICD-10-CM | POA: Insufficient documentation

## 2022-03-16 DIAGNOSIS — R188 Other ascites: Secondary | ICD-10-CM | POA: Diagnosis not present

## 2022-03-16 DIAGNOSIS — M25552 Pain in left hip: Secondary | ICD-10-CM | POA: Diagnosis not present

## 2022-03-16 DIAGNOSIS — N281 Cyst of kidney, acquired: Secondary | ICD-10-CM | POA: Diagnosis not present

## 2022-03-16 DIAGNOSIS — Z992 Dependence on renal dialysis: Secondary | ICD-10-CM | POA: Insufficient documentation

## 2022-03-16 DIAGNOSIS — E875 Hyperkalemia: Secondary | ICD-10-CM | POA: Insufficient documentation

## 2022-03-16 LAB — HEPATIC FUNCTION PANEL
ALT: 14 U/L (ref 0–44)
AST: 18 U/L (ref 15–41)
Albumin: 2.9 g/dL — ABNORMAL LOW (ref 3.5–5.0)
Alkaline Phosphatase: 129 U/L — ABNORMAL HIGH (ref 38–126)
Bilirubin, Direct: <0.1 mg/dL (ref 0.0–0.2)
Total Bilirubin: 0.8 mg/dL (ref 0.3–1.2)
Total Protein: 6.4 g/dL — ABNORMAL LOW (ref 6.5–8.1)

## 2022-03-16 LAB — BASIC METABOLIC PANEL
Anion gap: 13 (ref 5–15)
BUN: 76 mg/dL — ABNORMAL HIGH (ref 6–20)
CO2: 20 mmol/L — ABNORMAL LOW (ref 22–32)
Calcium: 7.2 mg/dL — ABNORMAL LOW (ref 8.9–10.3)
Chloride: 107 mmol/L (ref 98–111)
Creatinine, Ser: 15.35 mg/dL — ABNORMAL HIGH (ref 0.61–1.24)
GFR, Estimated: 3 mL/min — ABNORMAL LOW (ref >60)
Glucose, Bld: 109 mg/dL — ABNORMAL HIGH (ref 70–99)
Potassium: 5.3 mmol/L — ABNORMAL HIGH (ref 3.5–5.1)
Sodium: 140 mmol/L (ref 135–145)

## 2022-03-16 LAB — CBC
HCT: 28.2 % — ABNORMAL LOW (ref 39.0–52.0)
Hemoglobin: 9 g/dL — ABNORMAL LOW (ref 13.0–17.0)
MCH: 30 pg (ref 26.0–34.0)
MCHC: 31.9 g/dL (ref 30.0–36.0)
MCV: 94 fL (ref 80.0–100.0)
Platelets: 204 10*3/uL (ref 150–400)
RBC: 3 MIL/uL — ABNORMAL LOW (ref 4.22–5.81)
RDW: 19.4 % — ABNORMAL HIGH (ref 11.5–15.5)
WBC: 7.2 10*3/uL (ref 4.0–10.5)
nRBC: 0 % (ref 0.0–0.2)

## 2022-03-16 LAB — CBG MONITORING, ED: Glucose-Capillary: 89 mg/dL (ref 70–99)

## 2022-03-16 MED ORDER — ONDANSETRON HCL 4 MG/2ML IJ SOLN
4.0000 mg | Freq: Once | INTRAMUSCULAR | Status: AC
  Administered 2022-03-16: 4 mg via INTRAVENOUS
  Filled 2022-03-16: qty 2

## 2022-03-16 MED ORDER — ONDANSETRON HCL 4 MG PO TABS: 4.0000 mg | ORAL_TABLET | Freq: Four times a day (QID) | ORAL | 0 refills | Status: DC

## 2022-03-16 MED ORDER — SODIUM ZIRCONIUM CYCLOSILICATE 10 G PO PACK
10.0000 g | PACK | Freq: Once | ORAL | Status: AC
  Administered 2022-03-16: 10 g via ORAL
  Filled 2022-03-16: qty 1

## 2022-03-16 NOTE — ED Provider Notes (Signed)
[facility]GENC[facility] DEPARTMENT Provider Note   CSN: [id number] Arrival date & time: 03/16/22  1323     History  Chief Complaint  Patient presents with   Weakness   Emesis    Derrick Moore is a 56 y.o. male.  Patient here for some generalized weakness, emesis, bilateral leg swelling.  History of end-stage renal disease on peritoneal dialysis.  Denies any chest pain or shortness of breath.  He is feeling lightheaded last several days.  He has been dealing with ongoing sciatic/left lower back pain.  Patient has history of stroke, hypertension, high cholesterol.  He does not have any active abdominal pain but still has left flank pain which is somewhat chronic.  Feels a lot of chills.  Just completed his peritoneal dialysis at home today.  The history is provided by the patient.       Home Medications Prior to Admission medications   Medication Sig Start Date End Date Taking? Authorizing Provider  ondansetron (ZOFRAN) 4 MG tablet Take 1 tablet (4 mg total) by mouth every 6 (six) hours. 03/16/22  Yes Zeki Bedrosian, DO  acetaminophen (TYLENOL) 325 MG tablet Take 2 tablets (650 mg total) by mouth every 6 (six) hours as needed for moderate pain. 02/24/22   Donne Hazel, MD  amLODipine (NORVASC) 5 MG tablet Take 5 mg by mouth at bedtime.    [provider]  cinacalcet (SENSIPAR) 60 MG tablet Take 60 mg by mouth daily.    [provider]  clopidogrel (PLAVIX) 75 MG tablet Take 1 tablet (75 mg total) by mouth daily. 03/05/22   Saguier, Percell Miller, PA-C  folic acid (FOLVITE) 1 MG tablet Take 1 tablet (1 mg total) by mouth daily. 02/25/22 03/27/22  Donne Hazel, MD  HYDROcodone-acetaminophen (NORCO/VICODIN) 5-325 MG tablet Take 1 tablet by mouth every 8 (eight) hours as needed. 03/13/22   Rosemarie Ax, MD  labetalol (NORMODYNE) 200 MG tablet Take 200 mg by mouth 2 (two) times daily.    [provider]  lidocaine (LIDODERM) 5 % Place 1 patch onto the  skin daily. Remove & Discard patch within 12 hours or as directed by MD 02/12/22   Lennice Sites, DO  lisinopril (ZESTRIL) 30 MG tablet Take 30 mg by mouth at bedtime. 07/04/20   [provider]  methocarbamol (ROBAXIN) 500 MG tablet Take 1 tablet (500 mg total) by mouth every 8 (eight) hours as needed for muscle spasms. 01/19/22   Long, Wonda Olds, MD  naproxen (NAPROSYN) 500 MG tablet Take 1 tablet by mouth twice daily 02/27/22   Saguier, Percell Miller, PA-C  sodium polystyrene (KAYEXALATE) 15 GM/60ML suspension Take 60 ml by mouth daily for 2 days. 03/10/22   Saguier, Percell Miller, PA-C      Allergies    Patient has no known allergies.    Review of Systems   Review of Systems  Physical Exam Updated Vital Signs BP (!) 159/105   Pulse 72   Temp 98.3 F (36.8 C) (Oral)   Resp 11   SpO2 95%  Physical Exam Vitals and nursing note reviewed.  Constitutional:      General: He is not in acute distress.    Appearance: He is well-developed.  HENT:     Head: Normocephalic and atraumatic.     Nose: Nose normal.     Mouth/Throat:     Mouth: Mucous membranes are moist.  Eyes:     Extraocular Movements: Extraocular movements intact.     Conjunctiva/sclera:  Conjunctivae normal.     Pupils: Pupils are equal, round, and reactive to light.  Cardiovascular:     Rate and Rhythm: Normal rate and regular rhythm.     Pulses: Normal pulses.     Heart sounds: Normal heart sounds. No murmur heard. Pulmonary:     Effort: Pulmonary effort is normal. No respiratory distress.     Breath sounds: Normal breath sounds.  Abdominal:     Palpations: Abdomen is soft.     Tenderness: There is no abdominal tenderness. There is left CVA tenderness.  Musculoskeletal:        General: No swelling.     Cervical back: Neck supple.  Skin:    General: Skin is warm and dry.     Capillary Refill: Capillary refill takes less than 2 seconds.  Neurological:     General: No focal deficit present.     Mental Status: He is  alert and oriented to person, place, and time.     Cranial Nerves: No cranial nerve deficit.     Sensory: No sensory deficit.     Motor: No weakness.     Coordination: Coordination normal.     Comments: 5+ out of 5 strength throughout, normal sensation  Psychiatric:        Mood and Affect: Mood normal.     ED Results / Procedures / Treatments   Labs (all labs ordered are listed, but only abnormal results are displayed) Labs Reviewed  BASIC METABOLIC PANEL - Abnormal; Notable for the following components:      Result Value   Potassium 5.3 (*)    CO2 20 (*)    Glucose, Bld 109 (*)    BUN 76 (*)    Creatinine, Ser 15.35 (*)    Calcium 7.2 (*)    GFR, Estimated 3 (*)    All other components within normal limits  CBC - Abnormal; Notable for the following components:   RBC 3.00 (*)    Hemoglobin 9.0 (*)    HCT 28.2 (*)    RDW 19.4 (*)    All other components within normal limits  HEPATIC FUNCTION PANEL - Abnormal; Notable for the following components:   Total Protein 6.4 (*)    Albumin 2.9 (*)    Alkaline Phosphatase 129 (*)    All other components within normal limits  CBG MONITORING, ED    EKG EKG Interpretation  Date/Time:  Sunday March 16 2022 13:36:45 EDT Ventricular Rate:  75 PR Interval:  138 QRS Duration: 82 QT Interval:  398 QTC Calculation: 444 R Axis:   23 Text Interpretation: Normal sinus rhythm Minimal voltage criteria for LVH, may be normal variant ( Sokolow-Lyon ) When compared with ECG of 22-Feb-2022 16:44, PREVIOUS ECG IS PRESENT Confirmed by Lennice Sites [phone]) on 03/16/2022 2:59:58 PM  Radiology CT Renal Stone Study  Result Date: 03/16/2022 CLINICAL DATA:  Flank pain.  Rule out kidney stone.  Left hip pain. EXAM: CT ABDOMEN AND PELVIS WITHOUT CONTRAST TECHNIQUE: Multidetector CT imaging of the abdomen and pelvis was performed following the standard protocol without IV contrast. RADIATION DOSE REDUCTION: This exam was performed according to the  departmental dose-optimization program which includes automated exposure control, adjustment of the mA and/or kV according to patient size and/or use of iterative reconstruction technique. COMPARISON:  02/22/2022 FINDINGS: Lower chest: Subsegmental atelectasis noted in the posterior right base. Hepatobiliary: No focal liver abnormality is seen. No gallstones, gallbladder wall thickening, or biliary dilatation. Pancreas: Unremarkable. No pancreatic ductal  dilatation or surrounding inflammatory changes. Spleen: Normal in size without focal abnormality. Adrenals/Urinary Tract: Adrenal glands are normal. Unchanged appearance of bilateral renal cortical thinning and perinephric fat stranding. 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 No follow-up imaging recommended. Bilateral renal calcifications are identified, which are favored to be vascular in nature. No signs of hydronephrosis, hydroureter, or ureteral lithiasis. Bladder is unremarkable. Stomach/Bowel: Stomach appears normal. No pathologic dilatation of the large or small bowel loops to suggest a bowel obstruction. No bowel wall thickening or inflammation identified. The appendix is not confidently identified. No signs of pericecal inflammation. Vascular/Lymphatic: Aortic atherosclerosis. No signs of aneurysm. No abdominopelvic adenopathy identified. Reproductive: Prostate is unremarkable. Other: A peritoneal dialysis catheter is identified which enters from a left abdominal approach. The tip is coiled within the posterior pelvis. A small volume of free fluid is noted within the dependent portion of the pelvis. No discrete fluid collections. No signs of pneumoperitoneum. Musculoskeletal: Again noted is bony stigmata of chronic renal osteodystrophy with diffuse sclerosis and subchondral bone resorption about both SI joints and the pubic symphysis. No acute osseous abnormality identified. There is mild subchondral  sclerosis involving the left hip which may reflect early changes of avascular necrosis. IMPRESSION: 1. No acute findings within the abdomen or pelvis. 2. No evidence for hydronephrosis, hydroureter or ureteral lithiasis. 3. Peritoneal dialysis catheter with tip coiled within the posterior pelvis. Small volume of free fluid noted within the dependent portion of the pelvis. 4. Bony stigmata chronic renal osteodystrophy. 5. There is mild asymmetric subchondral sclerosis involving the left hip which may reflect early changes of avascular necrosis. 6. Aortic Atherosclerosis (ICD10-I70.0). Electronically Signed   By: Kerby Moors M.D.   On: 03/16/2022 16:08   DG Chest Portable 1 View  Result Date: 03/16/2022 CLINICAL DATA:  Emesis. EXAM: PORTABLE CHEST 1 VIEW COMPARISON:  02/20/2022 FINDINGS: Normal heart size. Aortic atherosclerotic calcifications. No pleural effusion or edema. No airspace opacities. Visualized osseous structures appear unremarkable. IMPRESSION: No acute cardiopulmonary abnormalities. Electronically Signed   By: Kerby Moors M.D.   On: 03/16/2022 15:39    Procedures Procedures    Medications Ordered in ED Medications  sodium zirconium cyclosilicate (LOKELMA) packet 10 g (has no administration in time range)  ondansetron (ZOFRAN) injection 4 mg (4 mg Intravenous Given 03/16/22 1528)    ED Course/ Medical Decision Making/ A&P                           Medical Decision Making Amount and/or Complexity of Data Reviewed Labs: ordered. Radiology: ordered.  Risk Prescription drug management.   Angelos [place] Hathorne is here with generalized weakness.  History of end-stage renal disease on peritoneal dialysis, high cholesterol, hypertension.  Patient denies any chest pain or shortness of breath.  No abdominal pain but ongoing left flank pain.  Denies any diarrhea but has had a lot of nausea and emesis this week.  Denies any fevers or chills.  Differential diagnosis is electrolyte  abnormality, dialysis issue, kidney stone, pneumonia, infectious process.  We will get CBC, CMP, lipase, CT scan abdomen pelvis, chest x-ray.  EKG shows sinus rhythm with benign repole changes.  T waves appear to be unchanged from prior.  Per my review and interpretation of labs potassium is 5.3.  Lab work otherwise unremarkable.  CT scans unremarkable.  Chest x-ray without any evidence of pneumonia.  Overall feeling better after Zofran.  Suspect viral process.  This could be peritoneal dialysis related/may be failing PD after talking with Dr. Melvia Heaps with nephrology.  Ultimately he is doing well and can follow-up outpatient with his nephrologist.  Patient per nephrology recommendations will be given a dose of Lokelma and will follow-up with his nephrology team.  Will prescribe Zofran.  Discharged in good condition.  This chart was dictated using voice recognition software.  Despite best efforts to proofread,  errors can occur which can change the documentation meaning.         Final Clinical Impression(s) / ED Diagnoses Final diagnoses:  Weakness  Hyperkalemia    Rx / DC Orders ED Discharge Orders          Ordered    ondansetron (ZOFRAN) 4 MG tablet  Every 6 hours        03/16/22 1628              Lennice Sites, DO 03/16/22 1631

## 2022-03-16 NOTE — ED Triage Notes (Signed)
Pt reports emesis this week accompanied by bilateral foot swelling. No diarrhea. Threw up 2x in past 24 hours. Denies chest pain or shortness of breath. Also having lightheadedness, but was recently started on pain medication for L hip.  Pt also reports L hip pain that started in knee and traveled up let to L side of abd.  Pt on peritoneal dialysis. Last completed today. Pt alert and oriented.

## 2022-03-16 NOTE — Discharge Instructions (Addendum)
Follow-up with your nephrologist. 

## 2022-03-17 DIAGNOSIS — N186 End stage renal disease: Secondary | ICD-10-CM | POA: Diagnosis not present

## 2022-03-17 DIAGNOSIS — Z992 Dependence on renal dialysis: Secondary | ICD-10-CM | POA: Diagnosis not present

## 2022-03-18 DIAGNOSIS — N186 End stage renal disease: Secondary | ICD-10-CM | POA: Diagnosis not present

## 2022-03-18 DIAGNOSIS — Z992 Dependence on renal dialysis: Secondary | ICD-10-CM | POA: Diagnosis not present

## 2022-03-19 DIAGNOSIS — Z992 Dependence on renal dialysis: Secondary | ICD-10-CM | POA: Diagnosis not present

## 2022-03-19 DIAGNOSIS — N186 End stage renal disease: Secondary | ICD-10-CM | POA: Diagnosis not present

## 2022-03-20 DIAGNOSIS — N186 End stage renal disease: Secondary | ICD-10-CM | POA: Diagnosis not present

## 2022-03-20 DIAGNOSIS — Z992 Dependence on renal dialysis: Secondary | ICD-10-CM | POA: Diagnosis not present

## 2022-03-21 DIAGNOSIS — Z992 Dependence on renal dialysis: Secondary | ICD-10-CM | POA: Diagnosis not present

## 2022-03-21 DIAGNOSIS — N186 End stage renal disease: Secondary | ICD-10-CM | POA: Diagnosis not present

## 2022-03-22 DIAGNOSIS — N186 End stage renal disease: Secondary | ICD-10-CM | POA: Diagnosis not present

## 2022-03-22 DIAGNOSIS — Z992 Dependence on renal dialysis: Secondary | ICD-10-CM | POA: Diagnosis not present

## 2022-03-23 DIAGNOSIS — Z992 Dependence on renal dialysis: Secondary | ICD-10-CM | POA: Diagnosis not present

## 2022-03-23 DIAGNOSIS — N186 End stage renal disease: Secondary | ICD-10-CM | POA: Diagnosis not present

## 2022-03-24 DIAGNOSIS — Z992 Dependence on renal dialysis: Secondary | ICD-10-CM | POA: Diagnosis not present

## 2022-03-24 DIAGNOSIS — N186 End stage renal disease: Secondary | ICD-10-CM | POA: Diagnosis not present

## 2022-03-25 DIAGNOSIS — Z992 Dependence on renal dialysis: Secondary | ICD-10-CM | POA: Diagnosis not present

## 2022-03-25 DIAGNOSIS — N186 End stage renal disease: Secondary | ICD-10-CM | POA: Diagnosis not present

## 2022-03-26 ENCOUNTER — Ambulatory Visit: Payer: Medicare HMO | Admitting: Family Medicine

## 2022-03-26 DIAGNOSIS — Z992 Dependence on renal dialysis: Secondary | ICD-10-CM | POA: Diagnosis not present

## 2022-03-26 DIAGNOSIS — N186 End stage renal disease: Secondary | ICD-10-CM | POA: Diagnosis not present

## 2022-03-27 DIAGNOSIS — Z992 Dependence on renal dialysis: Secondary | ICD-10-CM | POA: Diagnosis not present

## 2022-03-27 DIAGNOSIS — N186 End stage renal disease: Secondary | ICD-10-CM | POA: Diagnosis not present

## 2022-03-28 DIAGNOSIS — Z992 Dependence on renal dialysis: Secondary | ICD-10-CM | POA: Diagnosis not present

## 2022-03-28 DIAGNOSIS — N186 End stage renal disease: Secondary | ICD-10-CM | POA: Diagnosis not present

## 2022-03-29 DIAGNOSIS — Z992 Dependence on renal dialysis: Secondary | ICD-10-CM | POA: Diagnosis not present

## 2022-03-29 DIAGNOSIS — N186 End stage renal disease: Secondary | ICD-10-CM | POA: Diagnosis not present

## 2022-03-30 DIAGNOSIS — N186 End stage renal disease: Secondary | ICD-10-CM | POA: Diagnosis not present

## 2022-03-30 DIAGNOSIS — Z992 Dependence on renal dialysis: Secondary | ICD-10-CM | POA: Diagnosis not present

## 2022-03-31 DIAGNOSIS — N186 End stage renal disease: Secondary | ICD-10-CM | POA: Diagnosis not present

## 2022-03-31 DIAGNOSIS — Z992 Dependence on renal dialysis: Secondary | ICD-10-CM | POA: Diagnosis not present

## 2022-04-01 DIAGNOSIS — N186 End stage renal disease: Secondary | ICD-10-CM | POA: Diagnosis not present

## 2022-04-01 DIAGNOSIS — Z992 Dependence on renal dialysis: Secondary | ICD-10-CM | POA: Diagnosis not present

## 2022-04-02 DIAGNOSIS — Z992 Dependence on renal dialysis: Secondary | ICD-10-CM | POA: Diagnosis not present

## 2022-04-02 DIAGNOSIS — N186 End stage renal disease: Secondary | ICD-10-CM | POA: Diagnosis not present

## 2022-04-03 ENCOUNTER — Ambulatory Visit (INDEPENDENT_AMBULATORY_CARE_PROVIDER_SITE_OTHER): Payer: Medicare HMO

## 2022-04-03 VITALS — Ht 70.0 in | Wt 150.0 lb

## 2022-04-03 DIAGNOSIS — N186 End stage renal disease: Secondary | ICD-10-CM | POA: Diagnosis not present

## 2022-04-03 DIAGNOSIS — Z992 Dependence on renal dialysis: Secondary | ICD-10-CM | POA: Diagnosis not present

## 2022-04-03 DIAGNOSIS — Z1211 Encounter for screening for malignant neoplasm of colon: Secondary | ICD-10-CM | POA: Diagnosis not present

## 2022-04-03 DIAGNOSIS — Z0000 Encounter for general adult medical examination without abnormal findings: Secondary | ICD-10-CM

## 2022-04-03 NOTE — Patient Instructions (Signed)
Mr. Derrick Moore , Thank you for taking time to complete your Medicare Wellness Visit. I appreciate your ongoing commitment to your health goals. Please review the following plan we discussed and let me know if I can assist you in the future.   Screening recommendations/referrals: Colonoscopy: referral ordered today. Someone will call you to schedule. Recommended yearly ophthalmology/optometry visit for glaucoma screening and checkup Recommended yearly dental visit for hygiene and checkup  Vaccinations: Influenza vaccine: Due-May obtain vaccine at our office or your local pharmacy. Pneumococcal vaccine: Due at age 27 Tdap vaccine: Completed 2016-Due 2026 Shingles vaccine: Due-May obtain vaccine at your local pharmacy. Covid-19: May obtain vaccine at your local pharmacy.  Advanced directives: Please bring a copy of Living Will and/or [place] for your chart once completed   Conditions/risks identified: See problem list  Next appointment: Follow up in one year for your annual wellness visit   Preventive Care 40-64 Years, Male Preventive care refers to lifestyle choices and visits with your health care provider that can promote health and wellness. What does preventive care include? A yearly physical exam. This is also called an annual well check. Dental exams once or twice a year. Routine eye exams. Ask your health care provider how often you should have your eyes checked. Personal lifestyle choices, including: Daily care of your teeth and gums. Regular physical activity. Eating a healthy diet. Avoiding tobacco and drug use. Limiting alcohol use. Practicing safe sex. Taking low-dose aspirin every day starting at age 28. What happens during an annual well check? The services and screenings done by your health care provider during your annual well check will depend on your age, overall health, lifestyle risk factors, and family history of disease. Counseling   Your health care provider may ask you questions about your: Alcohol use. Tobacco use. Drug use. Emotional well-being. Home and relationship well-being. Sexual activity. Eating habits. Work and work Statistician. Screening  You may have the following tests or measurements: Height, weight, and BMI. Blood pressure. Lipid and cholesterol levels. These may be checked every 5 years, or more frequently if you are over 88 years old. Skin check. Lung cancer screening. You may have this screening every year starting at age 25 if you have a 30-pack-year history of smoking and currently smoke or have quit within the past 15 years. Fecal occult blood test (FOBT) of the stool. You may have this test every year starting at age 57. Flexible sigmoidoscopy or colonoscopy. You may have a sigmoidoscopy every 5 years or a colonoscopy every 10 years starting at age 14. Prostate cancer screening. Recommendations will vary depending on your family history and other risks. Hepatitis C blood test. Hepatitis B blood test. Sexually transmitted disease (STD) testing. Diabetes screening. This is done by checking your blood sugar (glucose) after you have not eaten for a while (fasting). You may have this done every 1-3 years. Discuss your test results, treatment options, and if necessary, the need for more tests with your health care provider. Vaccines  Your health care provider may recommend certain vaccines, such as: Influenza vaccine. This is recommended every year. Tetanus, diphtheria, and acellular pertussis (Tdap, Td) vaccine. You may need a Td booster every 10 years. Zoster vaccine. You may need this after age 45. Pneumococcal 13-valent conjugate (PCV13) vaccine. You may need this if you have certain conditions and have not been vaccinated. Pneumococcal polysaccharide (PPSV23) vaccine. You may need one or two doses if you smoke cigarettes or if you have certain  conditions. Talk to your health care provider  about which screenings and vaccines you need and how often you need them. This information is not intended to replace advice given to you by your health care provider. Make sure you discuss any questions you have with your health care provider. Document Released: 08/10/2015 Document Revised: 04/02/2016 Document Reviewed: 05/15/2015 Elsevier Interactive Patient Education  2017 [place] Prevention in the Home Falls can cause injuries. They can happen to people of all ages. There are many things you can do to make your home safe and to help prevent falls. What can I do on the outside of my home? Regularly fix the edges of walkways and driveways and fix any cracks. Remove anything that might make you trip as you walk through a door, such as a raised step or threshold. Trim any bushes or trees on the path to your home. Use bright outdoor lighting. Clear any walking paths of anything that might make someone trip, such as rocks or tools. Regularly check to see if handrails are loose or broken. Make sure that both sides of any steps have handrails. Any raised decks and porches should have guardrails on the edges. Have any leaves, snow, or ice cleared regularly. Use sand or salt on walking paths during winter. Clean up any spills in your garage right away. This includes oil or grease spills. What can I do in the bathroom? Use night lights. Install grab bars by the toilet and in the tub and shower. Do not use towel bars as grab bars. Use non-skid mats or decals in the tub or shower. If you need to sit down in the shower, use a plastic, non-slip stool. Keep the floor dry. Clean up any water that spills on the floor as soon as it happens. Remove soap buildup in the tub or shower regularly. Attach bath mats securely with double-sided non-slip rug tape. Do not have throw rugs and other things on the floor that can make you trip. What can I do in the bedroom? Use night lights. Make sure  that you have a light by your bed that is easy to reach. Do not use any sheets or blankets that are too big for your bed. They should not hang down onto the floor. Have a firm chair that has side arms. You can use this for support while you get dressed. Do not have throw rugs and other things on the floor that can make you trip. What can I do in the kitchen? Clean up any spills right away. Avoid walking on wet floors. Keep items that you use a lot in easy-to-reach places. If you need to reach something above you, use a strong step stool that has a grab bar. Keep electrical cords out of the way. Do not use floor polish or wax that makes floors slippery. If you must use wax, use non-skid floor wax. Do not have throw rugs and other things on the floor that can make you trip. What can I do with my stairs? Do not leave any items on the stairs. Make sure that there are handrails on both sides of the stairs and use them. Fix handrails that are broken or loose. Make sure that handrails are as long as the stairways. Check any carpeting to make sure that it is firmly attached to the stairs. Fix any carpet that is loose or worn. Avoid having throw rugs at the top or bottom of the stairs. If you do have  throw rugs, attach them to the floor with carpet tape. Make sure that you have a light switch at the top of the stairs and the bottom of the stairs. If you do not have them, ask someone to add them for you. What else can I do to help prevent falls? Wear shoes that: Do not have high heels. Have rubber bottoms. Are comfortable and fit you well. Are closed at the toe. Do not wear sandals. If you use a stepladder: Make sure that it is fully opened. Do not climb a closed stepladder. Make sure that both sides of the stepladder are locked into place. Ask someone to hold it for you, if possible. Clearly mark and make sure that you can see: Any grab bars or handrails. First and last steps. Where the edge of  each step is. Use tools that help you move around (mobility aids) if they are needed. These include: Canes. Walkers. Scooters. Crutches. Turn on the lights when you go into a dark area. Replace any light bulbs as soon as they burn out. Set up your furniture so you have a clear path. Avoid moving your furniture around. If any of your floors are uneven, fix them. If there are any pets around you, be aware of where they are. Review your medicines with your doctor. Some medicines can make you feel dizzy. This can increase your chance of falling. Ask your doctor what other things that you can do to help prevent falls. This information is not intended to replace advice given to you by your health care provider. Make sure you discuss any questions you have with your health care provider. Document Released: 05/10/2009 Document Revised: 12/20/2015 Document Reviewed: 08/18/2014 Elsevier Interactive Patient Education  2017 [employer].

## 2022-04-03 NOTE — Progress Notes (Addendum)
Subjective:   Derrick Moore is a 56 y.o. male who presents for an Initial Medicare Annual Wellness Visit.  I connected with Kerri today by telephone and verified that I am speaking with the correct person using two identifiers. Location patient: home Location provider: work Persons participating in the virtual visit: patient, Marine scientist.    I discussed the limitations, risks, security and privacy concerns of performing an evaluation and management service by telephone and the availability of in person appointments. I also discussed with the patient that there may be a patient responsible charge related to this service. The patient expressed understanding and verbally consented to this telephonic visit.    Interactive audio and video telecommunications were attempted between this provider and patient, however failed, due to patient having technical difficulties OR patient did not have access to video capability.  We continued and completed visit with audio only.  Some vital signs may be absent or patient reported.   Time Spent with patient on telephone encounter: 20 minutes   Review of Systems     Cardiac Risk Factors include: male gender;hypertension     Objective:    Today's Vitals   04/03/22 1000 04/03/22 1001  Weight: 150 lb (68 kg)   Height: 5\' 10"  (1.778 m)   PainSc:  4    Body mass index is 21.52 kg/m.     04/03/2022   10:05 AM 03/16/2022    1:33 PM 03/12/2022    7:37 PM 02/23/2022    6:06 AM 02/20/2022    2:02 PM 02/12/2022    5:49 PM 01/19/2022    6:05 PM  Advanced Directives  Does Patient Have a Medical Advance Directive? No No No No No No No  Would patient like information on creating a medical advance directive?    No - Patient declined       Current Medications (verified) Outpatient Encounter Medications as of 04/03/2022  Medication Sig   acetaminophen (TYLENOL) 325 MG tablet Take 2 tablets (650 mg total) by mouth every 6 (six) hours as needed for moderate  pain.   amLODipine (NORVASC) 5 MG tablet Take 5 mg by mouth at bedtime.   cinacalcet (SENSIPAR) 60 MG tablet Take 60 mg by mouth daily.   clopidogrel (PLAVIX) 75 MG tablet Take 1 tablet (75 mg total) by mouth daily.   HYDROcodone-acetaminophen (NORCO/VICODIN) 5-325 MG tablet Take 1 tablet by mouth every 8 (eight) hours as needed.   labetalol (NORMODYNE) 200 MG tablet Take 200 mg by mouth 2 (two) times daily.   lidocaine (LIDODERM) 5 % Place 1 patch onto the skin daily. Remove & Discard patch within 12 hours or as directed by MD   lisinopril (ZESTRIL) 30 MG tablet Take 30 mg by mouth at bedtime.   methocarbamol (ROBAXIN) 500 MG tablet Take 1 tablet (500 mg total) by mouth every 8 (eight) hours as needed for muscle spasms.   naproxen (NAPROSYN) 500 MG tablet Take 1 tablet by mouth twice daily   ondansetron (ZOFRAN) 4 MG tablet Take 1 tablet (4 mg total) by mouth every 6 (six) hours.   sodium polystyrene (KAYEXALATE) 15 GM/60ML suspension Take 60 ml by mouth daily for 2 days.   No facility-administered encounter medications on file as of 04/03/2022.    Allergies (verified) Patient has no known allergies.   History: Past Medical History:  Diagnosis Date   Dialysis patient [facility])    Hyperlipidemia    Hypertension    Renal failure    Renal failure  Stroke [facility])    History reviewed. No pertinent surgical history. No family history on file. Social History   Socioeconomic History   Marital status: Single    Spouse name: Not on file   Number of children: Not on file   Years of education: Not on file   Highest education level: Not on file  Occupational History   Not on file  Tobacco Use   Smoking status: Every Day    Packs/day: 0.25    Years: 33.00    Total pack years: 8.25    Types: Cigarettes   Smokeless tobacco: Never  Vaping Use   Vaping Use: Never used  Substance and Sexual Activity   Alcohol use: Not Currently    Alcohol/week: 1.0 standard drink of alcohol    Types: 1  Cans of beer per week   Drug use: Yes    Types: Marijuana   Sexual activity: Not on file  Other Topics Concern   Not on file  Social History Narrative   Not on file   Social Determinants of Health   Financial Resource Strain: Low Risk  (04/03/2022)   Overall Financial Resource Strain (CARDIA)    Difficulty of Paying Living Expenses: Not hard at all  Food Insecurity: No Food Insecurity (02/27/2022)   Hunger Vital Sign    Worried About Running Out of Food in the Last Year: Never true    Ran Out of Food in the Last Year: Never true  Transportation Needs: No Transportation Needs (02/27/2022)   PRAPARE - Hydrologist (Medical): No    Lack of Transportation (Non-Medical): No  Physical Activity: Not on file  Stress: No Stress Concern Present (04/03/2022)   [place]    Feeling of Stress : Not at all  Social Connections: Socially Isolated (04/03/2022)   Social Connection and Isolation Panel [NHANES]    Frequency of Communication with Friends and Family: More than three times a week    Frequency of Social Gatherings with Friends and Family: More than three times a week    Attends Religious Services: Never    Marine scientist or Organizations: No    Attends Music therapist: Never    Marital Status: Divorced    Tobacco Counseling Ready to quit: Not Answered Counseling given: Not Answered   Clinical Intake:  Pre-visit preparation completed: Yes  Pain : 0-10 Pain Score: 4  Pain Type: Chronic pain Pain Location: Leg (&hips) Pain Onset: More than a month ago Pain Frequency: Constant     BMI - recorded: 21.52 Nutritional Status: BMI of 19-24  Normal Nutritional Risks: None Diabetes: No  How often do you need to have someone help you when you read instructions, pamphlets, or other written materials from your doctor or pharmacy?: 1 - Never  Diabetic?No  Interpreter  Needed?: No  Information entered by :: Caroleen Hamman LPN   Activities of Daily Living    04/03/2022   10:09 AM 03/27/2022   11:35 AM  In your present state of health, do you have any difficulty performing the following activities:  Hearing? 0 0  Vision? 0 0  Difficulty concentrating or making decisions? 1 1  Comment occasionally   Walking or climbing stairs? 0 1  Dressing or bathing? 0 0  Doing errands, shopping? 0 1  Preparing Food and eating ? N N  Using the Toilet? N N  In the past six months, have  you accidently leaked urine? N N  Do you have problems with loss of bowel control? N N  Managing your Medications? N N  Managing your Finances? N N  Housekeeping or managing your Housekeeping? N Y    Patient Care Team: Saguier, Iris Pert as PCP - General (Internal Medicine) Luretha Rued, RN as [place] any recent Medical Services you may have received from other than [facility] providers in the past year (date may be approximate).     Assessment:   This is a routine wellness examination for Derrick Moore.  Hearing/Vision screen Hearing Screening - Comments:: No issues Vision Screening - Comments:: Last eye exam-out a year ago  Dietary issues and exercise activities discussed: Current Exercise Habits: The patient does not participate in regular exercise at present   Goals Addressed             This Visit's Progress    Patient Stated       Would like to build muscle & get stronger       Depression Screen    04/03/2022   10:08 AM 03/05/2022    2:39 PM 02/08/2021   11:43 AM 09/11/2020    1:54 PM  PHQ 2/9 Scores  PHQ - 2 Score 1 0 1 2  PHQ- 9 Score  0 9 7    Fall Risk    04/03/2022   10:07 AM 03/27/2022   11:35 AM 03/05/2022    2:39 PM  [place] in the past year? 0 1 0  Number falls in past yr: 0 0 0  Injury with Fall? 0 1 0  Follow up Falls prevention discussed      FALL RISK PREVENTION PERTAINING TO THE  HOME:  Any stairs in or around the home? No  Home free of loose throw rugs in walkways, pet beds, electrical cords, etc? Yes  Adequate lighting in your home to reduce risk of falls? Yes   ASSISTIVE DEVICES UTILIZED TO PREVENT FALLS:  Life alert? No  Use of a cane, walker or w/c? Yes  Grab bars in the bathroom? Yes  Shower chair or bench in shower? No  Elevated toilet seat or a handicapped toilet? No   TIMED UP AND GO:  Was the test performed? No . Phone visit   Cognitive Function:Normal cognitive status assessed by this Nurse Health Advisor. No abnormalities found.          04/03/2022   10:16 AM  6CIT Screen  What Year? 0 points  What month? 0 points  What time? 3 points  Count back from 20 0 points  Months in reverse 0 points  Repeat phrase 0 points  Total Score 3 points    Immunizations Immunization History  Administered Date(s) Administered   Pneumococcal Conjugate-13 02/18/2019    TDAP status: Due, Education has been provided regarding the importance of this vaccine. Advised may receive this vaccine at local pharmacy or Health Dept. Aware to provide a copy of the vaccination record if obtained from local pharmacy or Health Dept. Verbalized acceptance and understanding.  Flu Vaccine status: Due, Education has been provided regarding the importance of this vaccine. Advised may receive this vaccine at local pharmacy or Health Dept. Aware to provide a copy of the vaccination record if obtained from local pharmacy or Health Dept. Verbalized acceptance and understanding.  Pneumococcal vaccine status: Up to date  Covid-19 vaccine status: Information provided on how to obtain vaccines.  Qualifies for Shingles Vaccine? Yes   Zostavax completed No   Shingrix Completed?: No.    Education has been provided regarding the importance of this vaccine. Patient has been advised to call insurance company to determine out of pocket expense if they have not yet received this  vaccine. Advised may also receive vaccine at local pharmacy or Health Dept. Verbalized acceptance and understanding.  Screening Tests Health Maintenance  Topic Date Due   Hepatitis C Screening  Never done   TETANUS/TDAP  Never done   COLONOSCOPY (Pts 45-50yrs Insurance coverage will need to be confirmed)  Never done   Zoster Vaccines- Shingrix (1 of 2) Never done   INFLUENZA VACCINE  Never done   HIV Screening  Completed   HPV VACCINES  Aged Out   COVID-19 Vaccine  Discontinued    Health Maintenance  Health Maintenance Due  Topic Date Due   Hepatitis C Screening  Never done   TETANUS/TDAP  Never done   COLONOSCOPY (Pts 45-60yrs Insurance coverage will need to be confirmed)  Never done   Zoster Vaccines- Shingrix (1 of 2) Never done   INFLUENZA VACCINE  Never done    Colorectal cancer screening: Referral to GI placed today. Pt aware the office will call re: appt.  Lung Cancer Screening: (Low Dose CT Chest recommended if Age 21-80 years, 30 pack-year currently smoking OR have quit w/in 15years.) does not qualify.     Additional Screening:  Hepatitis C Screening: does not qualify; Discuss with PCP  Vision Screening: Recommended annual ophthalmology exams for early detection of glaucoma and other disorders of the eye. Is the patient up to date with their annual eye exam?  Yes  Who is the provider or what is the name of the office in which the patient attends annual eye exams? Unknown-eye dr is in [place]: Recommended annual dental exams for proper oral hygiene  Community Resource Referral / Chronic Care Management: CRR required this visit?  No   CCM required this visit?  No      Plan:     I have personally reviewed and noted the following in the patient's chart:   Medical and social history Use of alcohol, tobacco or illicit drugs  Current medications and supplements including opioid prescriptions. Patient is currently taking opioid  prescriptions. Information provided to patient regarding non-opioid alternatives. Patient advised to discuss non-opioid treatment plan with their provider. Functional ability and status Nutritional status Physical activity Advanced directives List of other physicians Hospitalizations, surgeries, and ER visits in previous 12 months Vitals Screenings to include cognitive, depression, and falls Referrals and appointments  In addition, I have reviewed and discussed with patient certain preventive protocols, quality metrics, and best practice recommendations. A written personalized care plan for preventive services as well as general preventive health recommendations were provided to patient.   Due to this being a telephonic visit, the after visit summary with patients personalized plan was offered to patient via mail or my-chart. Patient would like to access on my-chart.   Marta Antu, LPN   10/30/9199  Nurse Health Advisor  Nurse Notes: None  Review and Agree with assessment & plan of LPN   [name], PA-C

## 2022-04-04 ENCOUNTER — Ambulatory Visit: Payer: Self-pay

## 2022-04-04 DIAGNOSIS — N186 End stage renal disease: Secondary | ICD-10-CM | POA: Diagnosis not present

## 2022-04-04 DIAGNOSIS — Z992 Dependence on renal dialysis: Secondary | ICD-10-CM | POA: Diagnosis not present

## 2022-04-04 NOTE — Patient Outreach (Signed)
  Care Coordination   Follow Up Visit Note   04/04/2022 Name: Derrick Moore MRN: [id number] DOB: [birth year]  Derrick Moore is a 56 y.o. year old male who sees Saguier, Percell Miller, [place] for primary care. I spoke with  Derrick Moore by phone today.  What matters to the patients health and wellness today?  Reports some improvement and is walking without cane early in the day, but moves to cane by the end of the day. Missed latest appointment. Continues home dialysis. Patient is without any questions or concerns and states he has his monthly appointment with dialysis provider next week..   Goals Addressed             This Visit's Progress    decreased pain       Care Coordination Interventions: Discussed missed appointment with sports medicine and encouraged to reschedule Discussed importance of adherence to all scheduled medical appointments Encouraged to do home work exercises recommended by outpatient therapist         SDOH assessments and interventions completed:  No    Care Coordination Interventions Activated:  Yes  Care Coordination Interventions:  Yes, provided   Follow up plan: Follow up call scheduled for 04/21/22    Encounter Outcome:  Pt. Visit Completed   Thea Silversmith, RN, MSN, BSN, CCM Care Management Coordinator [phone]

## 2022-04-05 DIAGNOSIS — Z992 Dependence on renal dialysis: Secondary | ICD-10-CM | POA: Diagnosis not present

## 2022-04-05 DIAGNOSIS — N186 End stage renal disease: Secondary | ICD-10-CM | POA: Diagnosis not present

## 2022-04-06 DIAGNOSIS — N186 End stage renal disease: Secondary | ICD-10-CM | POA: Diagnosis not present

## 2022-04-06 DIAGNOSIS — Z992 Dependence on renal dialysis: Secondary | ICD-10-CM | POA: Diagnosis not present

## 2022-04-07 DIAGNOSIS — N186 End stage renal disease: Secondary | ICD-10-CM | POA: Diagnosis not present

## 2022-04-07 DIAGNOSIS — Z992 Dependence on renal dialysis: Secondary | ICD-10-CM | POA: Diagnosis not present

## 2022-04-08 DIAGNOSIS — N186 End stage renal disease: Secondary | ICD-10-CM | POA: Diagnosis not present

## 2022-04-08 DIAGNOSIS — Z992 Dependence on renal dialysis: Secondary | ICD-10-CM | POA: Diagnosis not present

## 2022-04-09 ENCOUNTER — Ambulatory Visit: Payer: Medicare HMO | Admitting: Family Medicine

## 2022-04-09 DIAGNOSIS — N186 End stage renal disease: Secondary | ICD-10-CM | POA: Diagnosis not present

## 2022-04-09 DIAGNOSIS — Z992 Dependence on renal dialysis: Secondary | ICD-10-CM | POA: Diagnosis not present

## 2022-04-10 DIAGNOSIS — N186 End stage renal disease: Secondary | ICD-10-CM | POA: Diagnosis not present

## 2022-04-10 DIAGNOSIS — Z992 Dependence on renal dialysis: Secondary | ICD-10-CM | POA: Diagnosis not present

## 2022-04-11 DIAGNOSIS — Z992 Dependence on renal dialysis: Secondary | ICD-10-CM | POA: Diagnosis not present

## 2022-04-11 DIAGNOSIS — N186 End stage renal disease: Secondary | ICD-10-CM | POA: Diagnosis not present

## 2022-04-12 DIAGNOSIS — N186 End stage renal disease: Secondary | ICD-10-CM | POA: Diagnosis not present

## 2022-04-12 DIAGNOSIS — Z992 Dependence on renal dialysis: Secondary | ICD-10-CM | POA: Diagnosis not present

## 2022-04-13 DIAGNOSIS — Z992 Dependence on renal dialysis: Secondary | ICD-10-CM | POA: Diagnosis not present

## 2022-04-13 DIAGNOSIS — N186 End stage renal disease: Secondary | ICD-10-CM | POA: Diagnosis not present

## 2022-04-14 ENCOUNTER — Ambulatory Visit: Payer: Medicare HMO | Admitting: Family Medicine

## 2022-04-14 DIAGNOSIS — N186 End stage renal disease: Secondary | ICD-10-CM | POA: Diagnosis not present

## 2022-04-14 DIAGNOSIS — Z992 Dependence on renal dialysis: Secondary | ICD-10-CM | POA: Diagnosis not present

## 2022-04-15 DIAGNOSIS — N186 End stage renal disease: Secondary | ICD-10-CM | POA: Diagnosis not present

## 2022-04-15 DIAGNOSIS — Z992 Dependence on renal dialysis: Secondary | ICD-10-CM | POA: Diagnosis not present

## 2022-04-16 DIAGNOSIS — Z992 Dependence on renal dialysis: Secondary | ICD-10-CM | POA: Diagnosis not present

## 2022-04-16 DIAGNOSIS — N186 End stage renal disease: Secondary | ICD-10-CM | POA: Diagnosis not present

## 2022-04-17 ENCOUNTER — Ambulatory Visit: Payer: Medicare HMO | Admitting: Family Medicine

## 2022-04-17 DIAGNOSIS — N186 End stage renal disease: Secondary | ICD-10-CM | POA: Diagnosis not present

## 2022-04-17 DIAGNOSIS — Z992 Dependence on renal dialysis: Secondary | ICD-10-CM | POA: Diagnosis not present

## 2022-04-18 DIAGNOSIS — N186 End stage renal disease: Secondary | ICD-10-CM | POA: Diagnosis not present

## 2022-04-18 DIAGNOSIS — Z992 Dependence on renal dialysis: Secondary | ICD-10-CM | POA: Diagnosis not present

## 2022-04-19 DIAGNOSIS — N186 End stage renal disease: Secondary | ICD-10-CM | POA: Diagnosis not present

## 2022-04-19 DIAGNOSIS — Z992 Dependence on renal dialysis: Secondary | ICD-10-CM | POA: Diagnosis not present

## 2022-04-20 DIAGNOSIS — Z992 Dependence on renal dialysis: Secondary | ICD-10-CM | POA: Diagnosis not present

## 2022-04-20 DIAGNOSIS — N186 End stage renal disease: Secondary | ICD-10-CM | POA: Diagnosis not present

## 2022-04-21 ENCOUNTER — Ambulatory Visit: Payer: Self-pay

## 2022-04-21 DIAGNOSIS — N186 End stage renal disease: Secondary | ICD-10-CM | POA: Diagnosis not present

## 2022-04-21 DIAGNOSIS — Z992 Dependence on renal dialysis: Secondary | ICD-10-CM | POA: Diagnosis not present

## 2022-04-21 NOTE — Patient Outreach (Signed)
  Care Coordination   Follow Up Visit Note   04/21/2022 Name: Derrick Moore MRN: [id number] DOB: 1966-04-08  Derrick Moore is a 56 y.o. year old male who sees Saguier, Percell Miller, [place] for primary care. I spoke with  Derrick Moore by phone today.  What matters to the patients health and wellness today?  Derrick Moore reports he is doing better. He rates his Left hip pain at "3" and reports he is doing much better. He expresses the recent loss of his step father and plans to move in with his mother. He reports he thinks this will be a good move for both he and his mother. He denies any care coordination needs or concerns at this time.    Goals Addressed             This Visit's Progress    COMPLETED: decreased pain       Care Coordination Interventions: Encouraged to call care coordinator and/or primary care provider if care coordination needs in the future.          SDOH assessments and interventions completed:  No    Care Coordination Interventions Activated:  Yes  Care Coordination Interventions:  Yes, provided   Follow up plan: No further intervention required.   Encounter Outcome:  Pt. Visit Completed   Derrick Silversmith, RN, MSN, BSN, CCM Care Coordinator [phone]

## 2022-04-22 DIAGNOSIS — Z992 Dependence on renal dialysis: Secondary | ICD-10-CM | POA: Diagnosis not present

## 2022-04-22 DIAGNOSIS — N186 End stage renal disease: Secondary | ICD-10-CM | POA: Diagnosis not present

## 2022-04-23 DIAGNOSIS — N186 End stage renal disease: Secondary | ICD-10-CM | POA: Diagnosis not present

## 2022-04-23 DIAGNOSIS — Z992 Dependence on renal dialysis: Secondary | ICD-10-CM | POA: Diagnosis not present

## 2022-04-24 DIAGNOSIS — N186 End stage renal disease: Secondary | ICD-10-CM | POA: Diagnosis not present

## 2022-04-24 DIAGNOSIS — Z992 Dependence on renal dialysis: Secondary | ICD-10-CM | POA: Diagnosis not present

## 2022-04-25 DIAGNOSIS — Z992 Dependence on renal dialysis: Secondary | ICD-10-CM | POA: Diagnosis not present

## 2022-04-25 DIAGNOSIS — N186 End stage renal disease: Secondary | ICD-10-CM | POA: Diagnosis not present

## 2022-04-26 DIAGNOSIS — Z992 Dependence on renal dialysis: Secondary | ICD-10-CM | POA: Diagnosis not present

## 2022-04-26 DIAGNOSIS — N186 End stage renal disease: Secondary | ICD-10-CM | POA: Diagnosis not present

## 2022-04-27 DIAGNOSIS — N186 End stage renal disease: Secondary | ICD-10-CM | POA: Diagnosis not present

## 2022-04-27 DIAGNOSIS — Z992 Dependence on renal dialysis: Secondary | ICD-10-CM | POA: Diagnosis not present

## 2022-04-28 DIAGNOSIS — Z992 Dependence on renal dialysis: Secondary | ICD-10-CM | POA: Diagnosis not present

## 2022-04-28 DIAGNOSIS — N186 End stage renal disease: Secondary | ICD-10-CM | POA: Diagnosis not present

## 2022-04-29 DIAGNOSIS — N186 End stage renal disease: Secondary | ICD-10-CM | POA: Diagnosis not present

## 2022-04-29 DIAGNOSIS — Z992 Dependence on renal dialysis: Secondary | ICD-10-CM | POA: Diagnosis not present

## 2022-04-30 DIAGNOSIS — Z992 Dependence on renal dialysis: Secondary | ICD-10-CM | POA: Diagnosis not present

## 2022-04-30 DIAGNOSIS — N186 End stage renal disease: Secondary | ICD-10-CM | POA: Diagnosis not present

## 2022-05-01 DIAGNOSIS — Z992 Dependence on renal dialysis: Secondary | ICD-10-CM | POA: Diagnosis not present

## 2022-05-01 DIAGNOSIS — N186 End stage renal disease: Secondary | ICD-10-CM | POA: Diagnosis not present

## 2022-05-02 ENCOUNTER — Ambulatory Visit (INDEPENDENT_AMBULATORY_CARE_PROVIDER_SITE_OTHER): Payer: Medicare HMO | Admitting: Family Medicine

## 2022-05-02 ENCOUNTER — Encounter: Payer: Self-pay | Admitting: Family Medicine

## 2022-05-02 VITALS — BP 168/100 | Ht 70.0 in | Wt 150.0 lb

## 2022-05-02 DIAGNOSIS — Z992 Dependence on renal dialysis: Secondary | ICD-10-CM | POA: Diagnosis not present

## 2022-05-02 DIAGNOSIS — M5416 Radiculopathy, lumbar region: Secondary | ICD-10-CM | POA: Diagnosis not present

## 2022-05-02 DIAGNOSIS — N186 End stage renal disease: Secondary | ICD-10-CM | POA: Diagnosis not present

## 2022-05-02 NOTE — Progress Notes (Signed)
  Derrick Moore - 56 y.o. male MRN [id number]  Date of birth: 05/12/1966  SUBJECTIVE:  Including CC & ROS.  No chief complaint on file.   Derrick Moore is a 56 y.o. male that is following up for his low back pain and left-sided radicular pain.  The pain is intermittent in nature.   Review of Systems See HPI   HISTORY: Past Medical, Surgical, Social, and Family History Reviewed & Updated per EMR.   Pertinent Historical Findings include:  Past Medical History:  Diagnosis Date   Dialysis patient [facility])    Hyperlipidemia    Hypertension    Renal failure    Renal failure    Stroke [facility])     History reviewed. No pertinent surgical history.   PHYSICAL EXAM:  VS: BP (!) 168/100 (BP Location: Left Arm, Patient Position: Sitting)   Ht 5\' 10"  (1.778 m)   Wt 150 lb (68 kg)   BMI 21.52 kg/m  Physical Exam Gen: NAD, alert, cooperative with exam, well-appearing MSK:  Neurovascularly intact       ASSESSMENT & PLAN:   Lumbar radiculopathy Acute on chronic in nature.  Continues to have pain.  It is exacerbated with any heavy lifting. -Counseled on home exercise therapy and supportive care. -Provided work paperwork

## 2022-05-02 NOTE — Assessment & Plan Note (Signed)
Acute on chronic in nature.  Continues to have pain.  It is exacerbated with any heavy lifting. -Counseled on home exercise therapy and supportive care. -Provided work paperwork

## 2022-05-03 DIAGNOSIS — Z992 Dependence on renal dialysis: Secondary | ICD-10-CM | POA: Diagnosis not present

## 2022-05-03 DIAGNOSIS — N186 End stage renal disease: Secondary | ICD-10-CM | POA: Diagnosis not present

## 2022-05-04 DIAGNOSIS — Z992 Dependence on renal dialysis: Secondary | ICD-10-CM | POA: Diagnosis not present

## 2022-05-04 DIAGNOSIS — N186 End stage renal disease: Secondary | ICD-10-CM | POA: Diagnosis not present

## 2022-05-05 DIAGNOSIS — Z992 Dependence on renal dialysis: Secondary | ICD-10-CM | POA: Diagnosis not present

## 2022-05-05 DIAGNOSIS — N186 End stage renal disease: Secondary | ICD-10-CM | POA: Diagnosis not present

## 2022-05-06 DIAGNOSIS — N186 End stage renal disease: Secondary | ICD-10-CM | POA: Diagnosis not present

## 2022-05-06 DIAGNOSIS — Z992 Dependence on renal dialysis: Secondary | ICD-10-CM | POA: Diagnosis not present

## 2022-05-07 DIAGNOSIS — Z992 Dependence on renal dialysis: Secondary | ICD-10-CM | POA: Diagnosis not present

## 2022-05-07 DIAGNOSIS — N186 End stage renal disease: Secondary | ICD-10-CM | POA: Diagnosis not present

## 2022-05-08 DIAGNOSIS — N186 End stage renal disease: Secondary | ICD-10-CM | POA: Diagnosis not present

## 2022-05-08 DIAGNOSIS — Z992 Dependence on renal dialysis: Secondary | ICD-10-CM | POA: Diagnosis not present

## 2022-05-09 ENCOUNTER — Other Ambulatory Visit: Payer: Self-pay | Admitting: Medical

## 2022-05-09 DIAGNOSIS — N186 End stage renal disease: Secondary | ICD-10-CM | POA: Diagnosis not present

## 2022-05-09 DIAGNOSIS — Z992 Dependence on renal dialysis: Secondary | ICD-10-CM | POA: Diagnosis not present

## 2022-05-10 DIAGNOSIS — Z992 Dependence on renal dialysis: Secondary | ICD-10-CM | POA: Diagnosis not present

## 2022-05-10 DIAGNOSIS — N186 End stage renal disease: Secondary | ICD-10-CM | POA: Diagnosis not present

## 2022-05-11 DIAGNOSIS — N186 End stage renal disease: Secondary | ICD-10-CM | POA: Diagnosis not present

## 2022-05-11 DIAGNOSIS — Z992 Dependence on renal dialysis: Secondary | ICD-10-CM | POA: Diagnosis not present

## 2022-05-12 DIAGNOSIS — Z992 Dependence on renal dialysis: Secondary | ICD-10-CM | POA: Diagnosis not present

## 2022-05-12 DIAGNOSIS — N186 End stage renal disease: Secondary | ICD-10-CM | POA: Diagnosis not present

## 2022-05-13 DIAGNOSIS — N186 End stage renal disease: Secondary | ICD-10-CM | POA: Diagnosis not present

## 2022-05-13 DIAGNOSIS — Z992 Dependence on renal dialysis: Secondary | ICD-10-CM | POA: Diagnosis not present

## 2022-05-14 DIAGNOSIS — N186 End stage renal disease: Secondary | ICD-10-CM | POA: Diagnosis not present

## 2022-05-14 DIAGNOSIS — Z992 Dependence on renal dialysis: Secondary | ICD-10-CM | POA: Diagnosis not present

## 2022-05-15 DIAGNOSIS — Z992 Dependence on renal dialysis: Secondary | ICD-10-CM | POA: Diagnosis not present

## 2022-05-15 DIAGNOSIS — N186 End stage renal disease: Secondary | ICD-10-CM | POA: Diagnosis not present

## 2022-05-16 DIAGNOSIS — N186 End stage renal disease: Secondary | ICD-10-CM | POA: Diagnosis not present

## 2022-05-16 DIAGNOSIS — Z992 Dependence on renal dialysis: Secondary | ICD-10-CM | POA: Diagnosis not present

## 2022-05-17 DIAGNOSIS — N186 End stage renal disease: Secondary | ICD-10-CM | POA: Diagnosis not present

## 2022-05-17 DIAGNOSIS — Z992 Dependence on renal dialysis: Secondary | ICD-10-CM | POA: Diagnosis not present

## 2022-05-18 DIAGNOSIS — N186 End stage renal disease: Secondary | ICD-10-CM | POA: Diagnosis not present

## 2022-05-18 DIAGNOSIS — Z992 Dependence on renal dialysis: Secondary | ICD-10-CM | POA: Diagnosis not present

## 2022-05-19 DIAGNOSIS — N186 End stage renal disease: Secondary | ICD-10-CM | POA: Diagnosis not present

## 2022-05-19 DIAGNOSIS — Z992 Dependence on renal dialysis: Secondary | ICD-10-CM | POA: Diagnosis not present

## 2022-05-20 DIAGNOSIS — Z992 Dependence on renal dialysis: Secondary | ICD-10-CM | POA: Diagnosis not present

## 2022-05-20 DIAGNOSIS — N186 End stage renal disease: Secondary | ICD-10-CM | POA: Diagnosis not present

## 2022-05-21 DIAGNOSIS — Z992 Dependence on renal dialysis: Secondary | ICD-10-CM | POA: Diagnosis not present

## 2022-05-21 DIAGNOSIS — N186 End stage renal disease: Secondary | ICD-10-CM | POA: Diagnosis not present

## 2022-05-22 DIAGNOSIS — Z992 Dependence on renal dialysis: Secondary | ICD-10-CM | POA: Diagnosis not present

## 2022-05-22 DIAGNOSIS — N186 End stage renal disease: Secondary | ICD-10-CM | POA: Diagnosis not present

## 2022-05-23 DIAGNOSIS — Z992 Dependence on renal dialysis: Secondary | ICD-10-CM | POA: Diagnosis not present

## 2022-05-23 DIAGNOSIS — N186 End stage renal disease: Secondary | ICD-10-CM | POA: Diagnosis not present

## 2022-05-24 DIAGNOSIS — Z992 Dependence on renal dialysis: Secondary | ICD-10-CM | POA: Diagnosis not present

## 2022-05-24 DIAGNOSIS — N186 End stage renal disease: Secondary | ICD-10-CM | POA: Diagnosis not present

## 2022-05-25 DIAGNOSIS — N186 End stage renal disease: Secondary | ICD-10-CM | POA: Diagnosis not present

## 2022-05-25 DIAGNOSIS — Z992 Dependence on renal dialysis: Secondary | ICD-10-CM | POA: Diagnosis not present

## 2022-05-26 DIAGNOSIS — N186 End stage renal disease: Secondary | ICD-10-CM | POA: Diagnosis not present

## 2022-05-26 DIAGNOSIS — Z992 Dependence on renal dialysis: Secondary | ICD-10-CM | POA: Diagnosis not present

## 2022-05-27 DIAGNOSIS — Z992 Dependence on renal dialysis: Secondary | ICD-10-CM | POA: Diagnosis not present

## 2022-05-27 DIAGNOSIS — N186 End stage renal disease: Secondary | ICD-10-CM | POA: Diagnosis not present

## 2022-05-28 DIAGNOSIS — N186 End stage renal disease: Secondary | ICD-10-CM | POA: Diagnosis not present

## 2022-05-28 DIAGNOSIS — Z992 Dependence on renal dialysis: Secondary | ICD-10-CM | POA: Diagnosis not present

## 2022-05-29 DIAGNOSIS — N186 End stage renal disease: Secondary | ICD-10-CM | POA: Diagnosis not present

## 2022-05-29 DIAGNOSIS — Z992 Dependence on renal dialysis: Secondary | ICD-10-CM | POA: Diagnosis not present

## 2022-05-30 DIAGNOSIS — N186 End stage renal disease: Secondary | ICD-10-CM | POA: Diagnosis not present

## 2022-05-30 DIAGNOSIS — Z992 Dependence on renal dialysis: Secondary | ICD-10-CM | POA: Diagnosis not present

## 2022-05-31 DIAGNOSIS — Z992 Dependence on renal dialysis: Secondary | ICD-10-CM | POA: Diagnosis not present

## 2022-05-31 DIAGNOSIS — N186 End stage renal disease: Secondary | ICD-10-CM | POA: Diagnosis not present

## 2022-06-01 DIAGNOSIS — Z992 Dependence on renal dialysis: Secondary | ICD-10-CM | POA: Diagnosis not present

## 2022-06-01 DIAGNOSIS — N186 End stage renal disease: Secondary | ICD-10-CM | POA: Diagnosis not present

## 2022-06-02 DIAGNOSIS — N186 End stage renal disease: Secondary | ICD-10-CM | POA: Diagnosis not present

## 2022-06-02 DIAGNOSIS — Z992 Dependence on renal dialysis: Secondary | ICD-10-CM | POA: Diagnosis not present

## 2022-06-03 DIAGNOSIS — N186 End stage renal disease: Secondary | ICD-10-CM | POA: Diagnosis not present

## 2022-06-03 DIAGNOSIS — Z992 Dependence on renal dialysis: Secondary | ICD-10-CM | POA: Diagnosis not present

## 2022-06-04 DIAGNOSIS — N186 End stage renal disease: Secondary | ICD-10-CM | POA: Diagnosis not present

## 2022-06-04 DIAGNOSIS — Z992 Dependence on renal dialysis: Secondary | ICD-10-CM | POA: Diagnosis not present

## 2022-06-05 DIAGNOSIS — N186 End stage renal disease: Secondary | ICD-10-CM | POA: Diagnosis not present

## 2022-06-05 DIAGNOSIS — Z992 Dependence on renal dialysis: Secondary | ICD-10-CM | POA: Diagnosis not present

## 2022-06-06 DIAGNOSIS — Z992 Dependence on renal dialysis: Secondary | ICD-10-CM | POA: Diagnosis not present

## 2022-06-06 DIAGNOSIS — N186 End stage renal disease: Secondary | ICD-10-CM | POA: Diagnosis not present

## 2022-06-07 DIAGNOSIS — N186 End stage renal disease: Secondary | ICD-10-CM | POA: Diagnosis not present

## 2022-06-07 DIAGNOSIS — Z992 Dependence on renal dialysis: Secondary | ICD-10-CM | POA: Diagnosis not present

## 2022-06-08 DIAGNOSIS — Z992 Dependence on renal dialysis: Secondary | ICD-10-CM | POA: Diagnosis not present

## 2022-06-08 DIAGNOSIS — N186 End stage renal disease: Secondary | ICD-10-CM | POA: Diagnosis not present

## 2022-06-09 DIAGNOSIS — N186 End stage renal disease: Secondary | ICD-10-CM | POA: Diagnosis not present

## 2022-06-09 DIAGNOSIS — Z992 Dependence on renal dialysis: Secondary | ICD-10-CM | POA: Diagnosis not present

## 2022-06-10 DIAGNOSIS — Z992 Dependence on renal dialysis: Secondary | ICD-10-CM | POA: Diagnosis not present

## 2022-06-10 DIAGNOSIS — N186 End stage renal disease: Secondary | ICD-10-CM | POA: Diagnosis not present

## 2022-06-11 DIAGNOSIS — Z992 Dependence on renal dialysis: Secondary | ICD-10-CM | POA: Diagnosis not present

## 2022-06-11 DIAGNOSIS — N186 End stage renal disease: Secondary | ICD-10-CM | POA: Diagnosis not present

## 2022-06-12 DIAGNOSIS — Z992 Dependence on renal dialysis: Secondary | ICD-10-CM | POA: Diagnosis not present

## 2022-06-12 DIAGNOSIS — N186 End stage renal disease: Secondary | ICD-10-CM | POA: Diagnosis not present

## 2022-06-13 ENCOUNTER — Other Ambulatory Visit: Payer: Self-pay | Admitting: Medical

## 2022-06-13 DIAGNOSIS — N186 End stage renal disease: Secondary | ICD-10-CM | POA: Diagnosis not present

## 2022-06-13 DIAGNOSIS — Z992 Dependence on renal dialysis: Secondary | ICD-10-CM | POA: Diagnosis not present

## 2022-06-14 DIAGNOSIS — N186 End stage renal disease: Secondary | ICD-10-CM | POA: Diagnosis not present

## 2022-06-14 DIAGNOSIS — Z992 Dependence on renal dialysis: Secondary | ICD-10-CM | POA: Diagnosis not present

## 2022-06-15 DIAGNOSIS — N186 End stage renal disease: Secondary | ICD-10-CM | POA: Diagnosis not present

## 2022-06-15 DIAGNOSIS — Z992 Dependence on renal dialysis: Secondary | ICD-10-CM | POA: Diagnosis not present

## 2022-06-16 DIAGNOSIS — Z992 Dependence on renal dialysis: Secondary | ICD-10-CM | POA: Diagnosis not present

## 2022-06-16 DIAGNOSIS — N186 End stage renal disease: Secondary | ICD-10-CM | POA: Diagnosis not present

## 2022-06-17 DIAGNOSIS — N186 End stage renal disease: Secondary | ICD-10-CM | POA: Diagnosis not present

## 2022-06-17 DIAGNOSIS — Z992 Dependence on renal dialysis: Secondary | ICD-10-CM | POA: Diagnosis not present

## 2022-06-18 DIAGNOSIS — N186 End stage renal disease: Secondary | ICD-10-CM | POA: Diagnosis not present

## 2022-06-18 DIAGNOSIS — Z992 Dependence on renal dialysis: Secondary | ICD-10-CM | POA: Diagnosis not present

## 2022-06-19 DIAGNOSIS — N186 End stage renal disease: Secondary | ICD-10-CM | POA: Diagnosis not present

## 2022-06-19 DIAGNOSIS — Z992 Dependence on renal dialysis: Secondary | ICD-10-CM | POA: Diagnosis not present

## 2022-06-20 DIAGNOSIS — Z992 Dependence on renal dialysis: Secondary | ICD-10-CM | POA: Diagnosis not present

## 2022-06-20 DIAGNOSIS — N186 End stage renal disease: Secondary | ICD-10-CM | POA: Diagnosis not present

## 2022-06-21 DIAGNOSIS — N186 End stage renal disease: Secondary | ICD-10-CM | POA: Diagnosis not present

## 2022-06-21 DIAGNOSIS — Z992 Dependence on renal dialysis: Secondary | ICD-10-CM | POA: Diagnosis not present

## 2022-06-22 DIAGNOSIS — Z992 Dependence on renal dialysis: Secondary | ICD-10-CM | POA: Diagnosis not present

## 2022-06-22 DIAGNOSIS — N186 End stage renal disease: Secondary | ICD-10-CM | POA: Diagnosis not present

## 2022-06-23 DIAGNOSIS — N186 End stage renal disease: Secondary | ICD-10-CM | POA: Diagnosis not present

## 2022-06-23 DIAGNOSIS — Z992 Dependence on renal dialysis: Secondary | ICD-10-CM | POA: Diagnosis not present

## 2022-06-24 ENCOUNTER — Encounter (HOSPITAL_BASED_OUTPATIENT_CLINIC_OR_DEPARTMENT_OTHER): Payer: Self-pay

## 2022-06-24 ENCOUNTER — Other Ambulatory Visit: Payer: Self-pay

## 2022-06-24 ENCOUNTER — Emergency Department (HOSPITAL_BASED_OUTPATIENT_CLINIC_OR_DEPARTMENT_OTHER): Payer: Medicare HMO

## 2022-06-24 ENCOUNTER — Emergency Department (HOSPITAL_BASED_OUTPATIENT_CLINIC_OR_DEPARTMENT_OTHER)
Admission: EM | Admit: 2022-06-24 | Discharge: 2022-06-24 | Discharge status: Against Medical Advice | Payer: Medicare HMO | Attending: Emergency Medicine | Admitting: Emergency Medicine

## 2022-06-24 DIAGNOSIS — N3289 Other specified disorders of bladder: Secondary | ICD-10-CM | POA: Diagnosis not present

## 2022-06-24 DIAGNOSIS — M533 Sacrococcygeal disorders, not elsewhere classified: Secondary | ICD-10-CM | POA: Diagnosis not present

## 2022-06-24 DIAGNOSIS — X501XXA Overexertion from prolonged static or awkward postures, initial encounter: Secondary | ICD-10-CM | POA: Diagnosis not present

## 2022-06-24 DIAGNOSIS — I10 Essential (primary) hypertension: Secondary | ICD-10-CM | POA: Diagnosis not present

## 2022-06-24 DIAGNOSIS — D649 Anemia, unspecified: Secondary | ICD-10-CM | POA: Insufficient documentation

## 2022-06-24 DIAGNOSIS — Z79899 Other long term (current) drug therapy: Secondary | ICD-10-CM | POA: Diagnosis not present

## 2022-06-24 DIAGNOSIS — E875 Hyperkalemia: Secondary | ICD-10-CM | POA: Insufficient documentation

## 2022-06-24 DIAGNOSIS — M25551 Pain in right hip: Secondary | ICD-10-CM | POA: Insufficient documentation

## 2022-06-24 DIAGNOSIS — R9431 Abnormal electrocardiogram [ECG] [EKG]: Secondary | ICD-10-CM | POA: Diagnosis not present

## 2022-06-24 DIAGNOSIS — N186 End stage renal disease: Secondary | ICD-10-CM | POA: Diagnosis not present

## 2022-06-24 DIAGNOSIS — Z992 Dependence on renal dialysis: Secondary | ICD-10-CM | POA: Diagnosis not present

## 2022-06-24 DIAGNOSIS — R188 Other ascites: Secondary | ICD-10-CM | POA: Diagnosis not present

## 2022-06-24 DIAGNOSIS — S32312A Displaced avulsion fracture of left ilium, initial encounter for closed fracture: Secondary | ICD-10-CM | POA: Diagnosis not present

## 2022-06-24 LAB — BASIC METABOLIC PANEL
Anion gap: 13 (ref 5–15)
BUN: 102 mg/dL — ABNORMAL HIGH (ref 6–20)
CO2: 12 mmol/L — ABNORMAL LOW (ref 22–32)
Calcium: 7.4 mg/dL — ABNORMAL LOW (ref 8.9–10.3)
Chloride: 113 mmol/L — ABNORMAL HIGH (ref 98–111)
Creatinine, Ser: 17.44 mg/dL — ABNORMAL HIGH (ref 0.61–1.24)
GFR, Estimated: 3 mL/min — ABNORMAL LOW (ref >60)
Glucose, Bld: 86 mg/dL (ref 70–99)
Potassium: 6 mmol/L — ABNORMAL HIGH (ref 3.5–5.1)
Sodium: 138 mmol/L (ref 135–145)

## 2022-06-24 LAB — CBC WITH DIFFERENTIAL/PLATELET
Abs Immature Granulocytes: 0.03 10*3/uL (ref 0.00–0.07)
Basophils Absolute: 0.1 10*3/uL (ref 0.0–0.1)
Basophils Relative: 1 %
Eosinophils Absolute: 0.6 10*3/uL — ABNORMAL HIGH (ref 0.0–0.5)
Eosinophils Relative: 9 %
HCT: 19.3 % — ABNORMAL LOW (ref 39.0–52.0)
Hemoglobin: 6.1 g/dL — CL (ref 13.0–17.0)
Immature Granulocytes: 0 %
Lymphocytes Relative: 16 %
Lymphs Abs: 1.1 10*3/uL (ref 0.7–4.0)
MCH: 29.6 pg (ref 26.0–34.0)
MCHC: 31.6 g/dL (ref 30.0–36.0)
MCV: 93.7 fL (ref 80.0–100.0)
Monocytes Absolute: 0.7 10*3/uL (ref 0.1–1.0)
Monocytes Relative: 11 %
Neutro Abs: 4.2 10*3/uL (ref 1.7–7.7)
Neutrophils Relative %: 63 %
Platelets: 195 10*3/uL (ref 150–400)
RBC: 2.06 MIL/uL — ABNORMAL LOW (ref 4.22–5.81)
RDW: 17 % — ABNORMAL HIGH (ref 11.5–15.5)
WBC: 6.7 10*3/uL (ref 4.0–10.5)
nRBC: 0 % (ref 0.0–0.2)

## 2022-06-24 MED ORDER — SODIUM ZIRCONIUM CYCLOSILICATE 5 G PO PACK
5.0000 g | PACK | Freq: Once | ORAL | Status: AC
  Administered 2022-06-24: 5 g via ORAL
  Filled 2022-06-24: qty 1

## 2022-06-24 MED ORDER — IBUPROFEN 400 MG PO TABS
600.0000 mg | ORAL_TABLET | Freq: Once | ORAL | Status: AC
  Administered 2022-06-24: 600 mg via ORAL
  Filled 2022-06-24: qty 1

## 2022-06-24 NOTE — ED Triage Notes (Signed)
C/o right posterior hip/buttocks pain, states radiates down leg. Was bending down on thanksgiving and heard a pop.

## 2022-06-24 NOTE — ED Notes (Signed)
Ama paperwork thoroughly reviewed with pt. Pt verbalized understanding of returning to the ER as soon as possible. Pt states he will go to ER tomorrow after taking care of apartment due to a leak and afraid that apartment will be ruined. Pt ambulated to wheelchair and pushed out to lobby.

## 2022-06-24 NOTE — ED Provider Notes (Cosign Needed Addendum)
[facility] Provider Note   CSN: [id number] Arrival date & time: 06/24/22  1156     History  Chief Complaint  Patient presents with   Hip Pain    Derrick Moore is a 56 y.o. male.   Hip Pain   56 year old male presents emergency department with complaints of right-sided hip pain.  Patient states that he has had chronic history of bilateral hip pain for the past several months.  Episode occurred this past Thursday where he was bending over to pick up an object and heard a loud pop with subsequent pain.  Pain is reported to be in right gluteal region with radiation down halfway of posterior right thigh.  Has been taking Tylenol/Motrin for pain which has helped his symptoms.  He works at [employer] and states that he was on his feet a lot during black Friday and states that the pain got worse as he is on his feet Friday and Saturday.  He reports worsening symptoms after awakening this morning which prompted his visit to the emergency department.  Denies weakness of sensory deficits in lower extremities, bowel/bladder dysfunction, low back pain, fever, history of IV drug use, known malignancy.  Past medical history significant for hyperlipidemia, hypertension, renal failure, stroke  Home Medications Prior to Admission medications   Medication Sig Start Date End Date Taking? Authorizing Provider  acetaminophen (TYLENOL) 325 MG tablet Take 2 tablets (650 mg total) by mouth every 6 (six) hours as needed for moderate pain. 02/24/22   Donne Hazel, MD  amLODipine (NORVASC) 5 MG tablet Take 5 mg by mouth at bedtime.    [provider]  cinacalcet (SENSIPAR) 60 MG tablet Take 60 mg by mouth daily.    [provider]  clopidogrel (PLAVIX) 75 MG tablet Take 1 tablet (75 mg total) by mouth daily. 03/05/22   Saguier, Percell Miller, PA-C  HYDROcodone-acetaminophen (NORCO/VICODIN) 5-325 MG tablet Take 1 tablet by mouth every 8 (eight) hours as needed.  03/13/22   Rosemarie Ax, MD  labetalol (NORMODYNE) 200 MG tablet Take 200 mg by mouth 2 (two) times daily.    [provider]  lidocaine (LIDODERM) 5 % Place 1 patch onto the skin daily. Remove & Discard patch within 12 hours or as directed by MD 02/12/22   Lennice Sites, DO  lisinopril (ZESTRIL) 30 MG tablet Take 30 mg by mouth at bedtime. 07/04/20   [provider]  methocarbamol (ROBAXIN) 500 MG tablet Take 1 tablet (500 mg total) by mouth every 8 (eight) hours as needed for muscle spasms. 01/19/22   Long, Wonda Olds, MD  naproxen (NAPROSYN) 500 MG tablet Take 1 tablet by mouth twice daily 06/13/22   Saguier, Percell Miller, PA-C  ondansetron (ZOFRAN) 4 MG tablet Take 1 tablet (4 mg total) by mouth every 6 (six) hours. 03/16/22   Curatolo, Adam, DO  sodium polystyrene (KAYEXALATE) 15 GM/60ML suspension Take 60 ml by mouth daily for 2 days. 03/10/22   Saguier, Percell Miller, PA-C      Allergies    Patient has no known allergies.    Review of Systems   Review of Systems  All other systems reviewed and are negative.   Physical Exam Updated Vital Signs BP (!) 160/103   Pulse 87   Temp 98 F (36.7 C) (Oral)   Resp 17   Ht 5\' 10"  (1.778 m)   Wt 68 kg   SpO2 100%   BMI 21.52 kg/m  Physical Exam Vitals and  nursing note reviewed.  Constitutional:      General: He is not in acute distress.    Appearance: He is well-developed.  HENT:     Head: Normocephalic and atraumatic.  Eyes:     Conjunctiva/sclera: Conjunctivae normal.  Cardiovascular:     Rate and Rhythm: Normal rate and regular rhythm.     Heart sounds: No murmur heard. Pulmonary:     Effort: Pulmonary effort is normal. No respiratory distress.     Breath sounds: Normal breath sounds.  Abdominal:     Palpations: Abdomen is soft.     Tenderness: There is no abdominal tenderness.  Musculoskeletal:        General: No swelling.     Cervical back: Neck supple.     Comments: No midline tenderness of cervical, thoracic,  lumbar spine with no obvious step-off or deformity.  No direct tenderness to palpation of the right or left.  No tenderness palpation of right or left gluteal region.  She leg raise negative bilaterally.  No tenderness palpation over pubic symphysis.  Pain is elicited with weightbearing as well as with active range of motion of the right hip.  Strength 5 out of 5 for knee flexion and extension, ankle dorsi/plantarflexion.  DTR symmetric and equal bilaterally.  Pedal pulses full intact bilaterally.  Skin:    General: Skin is warm and dry.     Capillary Refill: Capillary refill takes less than 2 seconds.  Neurological:     Mental Status: He is alert.  Psychiatric:        Mood and Affect: Mood normal.     ED Results / Procedures / Treatments   Labs (all labs ordered are listed, but only abnormal results are displayed) Labs Reviewed  BASIC METABOLIC PANEL - Abnormal; Notable for the following components:      Result Value   Potassium 6.0 (*)    Chloride 113 (*)    CO2 12 (*)    BUN 102 (*)    Creatinine, Ser 17.44 (*)    Calcium 7.4 (*)    GFR, Estimated 3 (*)    All other components within normal limits  CBC WITH DIFFERENTIAL/PLATELET - Abnormal; Notable for the following components:   RBC 2.06 (*)    Hemoglobin 6.1 (*)    HCT 19.3 (*)    RDW 17.0 (*)    Eosinophils Absolute 0.6 (*)    All other components within normal limits  OCCULT BLOOD X 1 CARD TO LAB, STOOL    EKG None  Radiology CT PELVIS WO CONTRAST  Result Date: 06/24/2022 CLINICAL DATA:  Posterior pain right EXAM: CT PELVIS WITHOUT CONTRAST TECHNIQUE: Multidetector CT imaging of the pelvis was performed following the standard protocol without intravenous contrast. RADIATION DOSE REDUCTION: This exam was performed according to the departmental dose-optimization program which includes automated exposure control, adjustment of the mA and/or kV according to patient size and/or use of iterative reconstruction technique.  COMPARISON:  03/16/2022 FINDINGS: Urinary Tract:  Urinary bladder is distended. Bowel: Visualized small bowel and colon are nondilated, unremarkable. Vascular/Lymphatic: Scattered aortoiliac calcified atheromatous plaque. No pelvic adenopathy localized. Reproductive:  No mass or other significant abnormality Other: Peritoneal dialysis catheter extends to the cul-de-sac. There is a small amount of free pelvic fluid. No free air. Musculoskeletal: Bilateral sacroiliitis with erosive changes left greater than right. Subacute fracture of the left iliac wing distracted less than 1 cm, margins somewhat indistinct, with periosteal reaction ; in retrospect a subtle fracture line is  evident on prior CT 03/16/2022. Extensive erosive changes about the pubic symphysis. Cortical avulsion injury from the left ischial tuberosity, more conspicuous than on 03/16/2022. Subacute ill-defined cortical avulsion injury from the right anterior inferior iliac spine, more conspicuous than 03/16/2022. Large Schmorl's node in the superior endplate of L4 as before. Degenerative disc disease L4-S1 stable. 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 IMPRESSION: 1. No definite acute bone abnormality. 2. Increased conspicuity of subacute left iliac wing fracture, left ischial tuberosity avulsion, and right anterior inferior iliac spine avulsion since 03/16/2022. 3. Erosive changes about the pubic symphysis and bilateral sacroiliac joints as before. Aortic Atherosclerosis (ICD10-I70.0). Electronically Signed   By: Lucrezia Europe M.D.   On: 06/24/2022 17:05   DG Hip Unilat  With Pelvis 2-3 Views Right  Result Date: 06/24/2022 CLINICAL DATA:  56 year old male "felt a pop" in right buttock when bending and persistent right hip and buttock pain. EXAM: DG HIP (WITH OR WITHOUT PELVIS) 2-3V RIGHT COMPARISON:  Right hip series 11/21/2021. CT Abdomen and Pelvis 03/16/2022. FINDINGS: Chronic peritoneal dialysis  type catheter now projects in the central pelvis. Negative visible bowel gas pattern. Progressive bone resorption at the pubic symphysis, pronounced since 11/20/22 but also appears progressed from the August CT Abdomen and Pelvis. There is now roughly 1.7 cm of pubic symphysis diastasis and the pubic bones now appear offset. No discrete pubic ramus fracture identified. Similar changes at both SI joints in August are less apparent radiographically. No other pelvis fracture identified. Femoral heads appear to remain normally located. Grossly intact proximal left femur. Proximal right femur appears stable and intact. IMPRESSION: 1. 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 2. But no discrete fracture of the pelvis or right hip identified. 3. Chronic pelvic peritoneal dialysis catheter. Electronically Signed   By: Genevie Ann M.D.   On: 06/24/2022 12:56    Procedures .Critical Care  Performed by: Wilnette Kales, PA Authorized by: Wilnette Kales, PA   Critical care provider statement:    Critical care time (minutes):  40   Critical care was necessary to treat or prevent imminent or life-threatening deterioration of the following conditions:  Renal failure and circulatory failure   Critical care was time spent personally by me on the following activities:  Development of treatment plan with patient or surrogate, evaluation of patient's response to treatment, examination of patient, ordering and review of laboratory studies, ordering and review of radiographic studies, ordering and performing treatments and interventions, pulse oximetry, re-evaluation of patient's condition and review of old charts   I assumed direction of critical care for this patient  from another provider in my specialty: no       Medications Ordered in ED Medications  ibuprofen (ADVIL) tablet 600 mg (600 mg Oral Given 06/24/22 1649)  sodium zirconium cyclosilicate (LOKELMA) packet 5 g (5 g Oral Given 06/24/22 1840)    ED Course/ Medical Decision Making/ A&P Clinical Course as of 06/24/22 1845  Tue Jun 24, 2022  1757 Upon reassessment the patient after laboratory studies have resulted, patient receives dialysis Monday Wednesday Friday has been compliant with most recent dialysis being yesterday.  Reports getting monthly EPO shots of which she has not had in the past 2 months secondary to not  going back to his physician.  Reports compliant with at home medicines.  Reports intermittent bright red blood per rectum over the past year but has not noticed increased amount over the past few to several weeks.  Denies any melena. [CR]  8938 Upon reassessment again, patient changes mind and decided against admission to the hospital.  He has a leak in his apartment and is concerned about potential flooding.  He wants to go home and return to emergency department in the morning.  I discussed at length with patient regarding findings concerning today of anemia, electrolyte abnormalities as well as subacute fractures on imaging findings.  We discussed life-threatening laboratory findings.  He still elected to go home [place] with prompt return in the morning.  Dose of Lokelma was given while in the emergency department for patient's hyperkalemia.   [CR]    Clinical Course User Index [CR] Wilnette Kales, PA                           Medical Decision Making Amount and/or Complexity of Data Reviewed Labs: ordered. Radiology: ordered.  Risk Prescription drug management.   This patient presents to the ED for concern of right hip pain, this involves an extensive number of treatment options, and is a complaint that carries with it a high risk of complications and  morbidity.  The differential diagnosis includes fracture, strain/sprain, dislocation, osteoarthritis, rheumatoid [place], septic arthritis, compartment syndrome, sciatica, spinal cord impingement   Co morbidities that complicate the patient evaluation  See HPI   Additional history obtained:  Additional history obtained from EMR External records from outside source obtained and reviewed including prior CT abdomen pelvis from 8/23   Lab Tests:  I Ordered, and personally interpreted labs.  The pertinent results include: No leukocytosis noted.  Severe anemia with a hemoglobin of 6.1 which is drastically decreased from patient's baseline around 8.2-9.  Platelets within normal range.  Moderate hyperkalemia with a potassium of 6.0 for which patient received 1 dose of Lokelma.  Patient has around baseline kidney function with GFR 3, BUN of 22 and creatinine 17.44   Imaging Studies ordered:  I ordered imaging studies including pelvis with right hip x-ray, CT pelvis I independently visualized and interpreted imaging which showed  Pelvis with right hip x-ray: Substantial bone resorption at the pubic symphysis with evidence of pathologic pubic symphysis diastasis and newly offset appearance of bilateral pubic bone.  No discrete fracture pelvis or right hip identified.  Chronic pelvic peritoneal dialysis catheter. CT pelvis: No definitive acute bony abnormality.  Increased no stability of subacute left iliac wing fracture.,  Left ischial tuberosity avulsion and right anterior inferior iliac spine avulsion.  Erosive changes of pubic symphysis and bilateral SI joints as before. I agree with the radiologist interpretation  Cardiac Monitoring: / EKG:  The patient was maintained on a cardiac monitor.  I personally viewed and interpreted the cardiac monitored which showed an underlying rhythm of: Sinus rhythm   Consultations Obtained:  I requested consultation with the attending physician Dr. Melina Copa who  is in agreement with treatment plan going forward.  Problem List / ED Course / Critical interventions / Medication management  Anemia/hyperkalemia/subacute pelvic fractures Ibuprofen was ordered by the nursing staff in the triage area for patient's pain despite his baseline kidney function and dialysis dependence.  Blood, ordered for patient's hyperkalemia.   Reevaluation of the patient after these medicines showed that the patient improved  I have reviewed the patients home medicines and have made adjustments as needed   Social Determinants of Health:  Chronic cigarette use.  Denies illicit drug use.   Test / Admission - Considered:  Anemia/hyperkalemia/subacute pelvic fractures Vitals signs significant for blood pressure 160/103.  Recommend close follow-up with primary care regarding elevation of blood pressure.. Otherwise within normal range and stable throughout visit. Laboratory/imaging studies significant for: See above 1757 Upon reassessment the patient after laboratory studies have resulted, patient receives dialysis Monday Wednesday Friday has been compliant with most recent dialysis being yesterday.  Reports getting monthly EPO shots of which she has not had in the past 2 months secondary to not going back to his physician.  Reports compliant with at home medicines.  Reports intermittent bright red blood per rectum over the past year but has not noticed increased amount over the past few to several weeks.  Denies any melena. 1827 Upon reassessment again, patient changes mind and decided against admission to the hospital.  He has a leak in his apartment and is concerned about potential flooding.  He wants to go home and return to emergency department in the morning.  I discussed at length with patient regarding findings concerning today of anemia, electrolyte abnormalities as well as subacute fractures on imaging findings.  We discussed life-threatening laboratory findings.  He still  elected to go home [place] with prompt return in the morning.  Dose of Lokelma was given while in the emergency department for patient's hyperkalemia.   As depicted above, discussion was had at length with patient regarding his concerning anemia, hyperkalemia as well as multiple abnormalities on CT pelvis.  Discussed at length the patient regarding life-threatening findings and patient elected to be discharged [facility].  Patient recommended to come back to the hospital as soon as he is able to come in to [facility] given that they have blood bank available for patient's anemia.  Patient also declined rectal exam at this time.  Patient was deemed to have capacity.  Patient stable upon discharge.        Final Clinical Impression(s) / ED Diagnoses Final diagnoses:  Anemia, unspecified type  Right hip pain  Hyperkalemia    Rx / DC Orders ED Discharge Orders     None         Wilnette Kales, [place] 06/24/22 1846    Wilnette Kales, PA 06/24/22 1846    Hayden Rasmussen, MD 06/25/22 [phone]

## 2022-06-24 NOTE — Discharge Instructions (Signed)
Note that you are leaving [place].  Once you get your apartment situation situated, recommend prompt return to the emergency department as you are concerningly anemic and needed blood.  Your potassium was also elevated while you are here and you most likely need more urgent dialysis along with receiving blood.  He also needs seen orthopedic for findings on your CT scan of your pelvis as we discussed.  Try to avoid NSAIDs such as ibuprofen/Aleve given your kidney disease history.  Recommend taking Tylenol as needed for pain.  Please follow-up with the emergency department for at either, to [facility] long as they have blood available to be given.  Call 911 if anything acutely arises in the meantime.

## 2022-06-25 DIAGNOSIS — Z992 Dependence on renal dialysis: Secondary | ICD-10-CM | POA: Diagnosis not present

## 2022-06-25 DIAGNOSIS — N186 End stage renal disease: Secondary | ICD-10-CM | POA: Diagnosis not present

## 2022-06-27 DIAGNOSIS — Z992 Dependence on renal dialysis: Secondary | ICD-10-CM | POA: Diagnosis not present

## 2022-06-27 DIAGNOSIS — N186 End stage renal disease: Secondary | ICD-10-CM | POA: Diagnosis not present

## 2022-06-28 DIAGNOSIS — Z992 Dependence on renal dialysis: Secondary | ICD-10-CM | POA: Diagnosis not present

## 2022-06-28 DIAGNOSIS — N186 End stage renal disease: Secondary | ICD-10-CM | POA: Diagnosis not present

## 2022-06-29 DIAGNOSIS — N186 End stage renal disease: Secondary | ICD-10-CM | POA: Diagnosis not present

## 2022-06-29 DIAGNOSIS — Z992 Dependence on renal dialysis: Secondary | ICD-10-CM | POA: Diagnosis not present

## 2022-06-30 ENCOUNTER — Emergency Department (HOSPITAL_COMMUNITY)
Admission: EM | Admit: 2022-06-30 | Discharge: 2022-06-30 | Discharge status: Against Medical Advice | Payer: Medicare HMO | Source: Home / Self Care

## 2022-06-30 DIAGNOSIS — N186 End stage renal disease: Secondary | ICD-10-CM | POA: Diagnosis not present

## 2022-06-30 DIAGNOSIS — Z992 Dependence on renal dialysis: Secondary | ICD-10-CM | POA: Diagnosis not present

## 2022-07-01 ENCOUNTER — Other Ambulatory Visit: Payer: Self-pay

## 2022-07-01 ENCOUNTER — Encounter (HOSPITAL_COMMUNITY): Payer: Self-pay

## 2022-07-01 ENCOUNTER — Emergency Department (HOSPITAL_COMMUNITY): Payer: Medicare HMO

## 2022-07-01 ENCOUNTER — Inpatient Hospital Stay (HOSPITAL_COMMUNITY)
Admission: EM | Admit: 2022-07-01 | Discharge: 2022-07-04 | DRG: 811 | Discharge status: Against Medical Advice | Payer: Medicare HMO | Attending: Family Medicine | Admitting: Family Medicine

## 2022-07-01 DIAGNOSIS — Z992 Dependence on renal dialysis: Secondary | ICD-10-CM

## 2022-07-01 DIAGNOSIS — D649 Anemia, unspecified: Secondary | ICD-10-CM | POA: Diagnosis not present

## 2022-07-01 DIAGNOSIS — M898X9 Other specified disorders of bone, unspecified site: Secondary | ICD-10-CM | POA: Diagnosis present

## 2022-07-01 DIAGNOSIS — I120 Hypertensive chronic kidney disease with stage 5 chronic kidney disease or end stage renal disease: Secondary | ICD-10-CM | POA: Diagnosis present

## 2022-07-01 DIAGNOSIS — D62 Acute posthemorrhagic anemia: Secondary | ICD-10-CM | POA: Diagnosis not present

## 2022-07-01 DIAGNOSIS — G8929 Other chronic pain: Secondary | ICD-10-CM | POA: Diagnosis present

## 2022-07-01 DIAGNOSIS — M25851 Other specified joint disorders, right hip: Secondary | ICD-10-CM | POA: Diagnosis present

## 2022-07-01 DIAGNOSIS — Z8673 Personal history of transient ischemic attack (TIA), and cerebral infarction without residual deficits: Secondary | ICD-10-CM

## 2022-07-01 DIAGNOSIS — M533 Sacrococcygeal disorders, not elsewhere classified: Secondary | ICD-10-CM | POA: Diagnosis present

## 2022-07-01 DIAGNOSIS — K921 Melena: Secondary | ICD-10-CM | POA: Diagnosis present

## 2022-07-01 DIAGNOSIS — I10 Essential (primary) hypertension: Secondary | ICD-10-CM | POA: Diagnosis not present

## 2022-07-01 DIAGNOSIS — N186 End stage renal disease: Secondary | ICD-10-CM | POA: Diagnosis not present

## 2022-07-01 DIAGNOSIS — N2581 Secondary hyperparathyroidism of renal origin: Secondary | ICD-10-CM | POA: Diagnosis present

## 2022-07-01 DIAGNOSIS — R944 Abnormal results of kidney function studies: Secondary | ICD-10-CM | POA: Diagnosis present

## 2022-07-01 DIAGNOSIS — E871 Hypo-osmolality and hyponatremia: Secondary | ICD-10-CM | POA: Diagnosis present

## 2022-07-01 DIAGNOSIS — E785 Hyperlipidemia, unspecified: Secondary | ICD-10-CM | POA: Diagnosis present

## 2022-07-01 DIAGNOSIS — M461 Sacroiliitis, not elsewhere classified: Secondary | ICD-10-CM | POA: Diagnosis present

## 2022-07-01 DIAGNOSIS — Z7902 Long term (current) use of antithrombotics/antiplatelets: Secondary | ICD-10-CM

## 2022-07-01 DIAGNOSIS — M25552 Pain in left hip: Secondary | ICD-10-CM | POA: Diagnosis not present

## 2022-07-01 DIAGNOSIS — Z5329 Procedure and treatment not carried out because of patient's decision for other reasons: Secondary | ICD-10-CM | POA: Diagnosis not present

## 2022-07-01 DIAGNOSIS — R262 Difficulty in walking, not elsewhere classified: Secondary | ICD-10-CM | POA: Diagnosis present

## 2022-07-01 DIAGNOSIS — M545 Low back pain, unspecified: Secondary | ICD-10-CM | POA: Diagnosis present

## 2022-07-01 DIAGNOSIS — D631 Anemia in chronic kidney disease: Secondary | ICD-10-CM | POA: Diagnosis present

## 2022-07-01 DIAGNOSIS — M47816 Spondylosis without myelopathy or radiculopathy, lumbar region: Secondary | ICD-10-CM | POA: Diagnosis not present

## 2022-07-01 DIAGNOSIS — M84454D Pathological fracture, pelvis, subsequent encounter for fracture with routine healing: Secondary | ICD-10-CM | POA: Diagnosis present

## 2022-07-01 DIAGNOSIS — F17210 Nicotine dependence, cigarettes, uncomplicated: Secondary | ICD-10-CM | POA: Diagnosis present

## 2022-07-01 DIAGNOSIS — Z79899 Other long term (current) drug therapy: Secondary | ICD-10-CM

## 2022-07-01 DIAGNOSIS — N250 Renal osteodystrophy: Secondary | ICD-10-CM | POA: Diagnosis present

## 2022-07-01 DIAGNOSIS — D691 Qualitative platelet defects: Secondary | ICD-10-CM | POA: Diagnosis present

## 2022-07-01 LAB — CBC WITH DIFFERENTIAL/PLATELET
Abs Immature Granulocytes: 0.02 10*3/uL (ref 0.00–0.07)
Basophils Absolute: 0 10*3/uL (ref 0.0–0.1)
Basophils Relative: 1 %
Eosinophils Absolute: 0.4 10*3/uL (ref 0.0–0.5)
Eosinophils Relative: 6 %
HCT: 16.4 % — ABNORMAL LOW (ref 39.0–52.0)
Hemoglobin: 5.2 g/dL — CL (ref 13.0–17.0)
Immature Granulocytes: 0 %
Lymphocytes Relative: 15 %
Lymphs Abs: 0.9 10*3/uL (ref 0.7–4.0)
MCH: 30.4 pg (ref 26.0–34.0)
MCHC: 31.7 g/dL (ref 30.0–36.0)
MCV: 95.9 fL (ref 80.0–100.0)
Monocytes Absolute: 0.6 10*3/uL (ref 0.1–1.0)
Monocytes Relative: 11 %
Neutro Abs: 3.9 10*3/uL (ref 1.7–7.7)
Neutrophils Relative %: 67 %
Platelets: 174 10*3/uL (ref 150–400)
RBC: 1.71 MIL/uL — ABNORMAL LOW (ref 4.22–5.81)
RDW: 16.9 % — ABNORMAL HIGH (ref 11.5–15.5)
WBC: 5.7 10*3/uL (ref 4.0–10.5)
nRBC: 0 % (ref 0.0–0.2)

## 2022-07-01 LAB — BASIC METABOLIC PANEL
Anion gap: 16 — ABNORMAL HIGH (ref 5–15)
BUN: 111 mg/dL — ABNORMAL HIGH (ref 6–20)
CO2: 12 mmol/L — ABNORMAL LOW (ref 22–32)
Calcium: 7.4 mg/dL — ABNORMAL LOW (ref 8.9–10.3)
Chloride: 108 mmol/L (ref 98–111)
Creatinine, Ser: 19.02 mg/dL — ABNORMAL HIGH (ref 0.61–1.24)
GFR, Estimated: 3 mL/min — ABNORMAL LOW (ref >60)
Glucose, Bld: 98 mg/dL (ref 70–99)
Potassium: 5 mmol/L (ref 3.5–5.1)
Sodium: 136 mmol/L (ref 135–145)

## 2022-07-01 LAB — PREPARE RBC (CROSSMATCH)

## 2022-07-01 MED ORDER — CLOPIDOGREL BISULFATE 75 MG PO TABS
75.0000 mg | ORAL_TABLET | Freq: Every day | ORAL | Status: DC
  Administered 2022-07-02: 75 mg via ORAL
  Filled 2022-07-01: qty 1

## 2022-07-01 MED ORDER — ONDANSETRON HCL 4 MG PO TABS: 4.0000 mg | ORAL_TABLET | Freq: Four times a day (QID) | ORAL | Status: DC | PRN

## 2022-07-01 MED ORDER — ACETAMINOPHEN 325 MG PO TABS: 650.0000 mg | ORAL_TABLET | Freq: Four times a day (QID) | ORAL | Status: DC | PRN

## 2022-07-01 MED ORDER — LABETALOL HCL 100 MG PO TABS
200.0000 mg | ORAL_TABLET | Freq: Two times a day (BID) | ORAL | Status: DC
  Administered 2022-07-01 – 2022-07-04 (×5): 200 mg via ORAL
  Filled 2022-07-01: qty 1
  Filled 2022-07-01: qty 2
  Filled 2022-07-01: qty 1
  Filled 2022-07-01 (×3): qty 2

## 2022-07-01 MED ORDER — SODIUM CHLORIDE 0.9% IV SOLUTION: Freq: Once | INTRAVENOUS | Status: AC

## 2022-07-01 MED ORDER — LIDOCAINE 5 % EX PTCH: 1.0000 | MEDICATED_PATCH | CUTANEOUS | Status: DC

## 2022-07-01 MED ORDER — ACETAMINOPHEN 650 MG RE SUPP: 650.0000 mg | Freq: Four times a day (QID) | RECTAL | Status: DC | PRN

## 2022-07-01 MED ORDER — METHOCARBAMOL 500 MG PO TABS: 500.0000 mg | ORAL_TABLET | Freq: Three times a day (TID) | ORAL | Status: DC | PRN

## 2022-07-01 MED ORDER — AMLODIPINE BESYLATE 5 MG PO TABS
5.0000 mg | ORAL_TABLET | Freq: Every day | ORAL | Status: DC
  Administered 2022-07-02 – 2022-07-04 (×3): 5 mg via ORAL
  Filled 2022-07-01 (×3): qty 1

## 2022-07-01 MED ORDER — HEPARIN SODIUM (PORCINE) 5000 UNIT/ML IJ SOLN
5000.0000 [IU] | Freq: Three times a day (TID) | INTRAMUSCULAR | Status: DC
  Administered 2022-07-01 – 2022-07-03 (×4): 5000 [IU] via SUBCUTANEOUS
  Filled 2022-07-01 (×5): qty 1

## 2022-07-01 MED ORDER — LISINOPRIL 20 MG PO TABS
30.0000 mg | ORAL_TABLET | Freq: Every day | ORAL | Status: DC
  Administered 2022-07-01 – 2022-07-03 (×3): 30 mg via ORAL
  Filled 2022-07-01 (×3): qty 1

## 2022-07-01 MED ORDER — OXYCODONE-ACETAMINOPHEN 5-325 MG PO TABS
1.0000 | ORAL_TABLET | ORAL | Status: DC | PRN
  Administered 2022-07-01: 1 via ORAL
  Filled 2022-07-01: qty 1

## 2022-07-01 MED ORDER — CINACALCET HCL 30 MG PO TABS
60.0000 mg | ORAL_TABLET | Freq: Every day | ORAL | Status: DC
  Administered 2022-07-02 – 2022-07-04 (×3): 60 mg via ORAL
  Filled 2022-07-01 (×4): qty 2

## 2022-07-01 MED ORDER — OXYCODONE HCL 5 MG PO TABS
5.0000 mg | ORAL_TABLET | ORAL | Status: DC | PRN
  Administered 2022-07-01: 5 mg via ORAL
  Filled 2022-07-01: qty 1

## 2022-07-01 MED ORDER — ONDANSETRON HCL 4 MG/2ML IJ SOLN: 4.0000 mg | Freq: Four times a day (QID) | INTRAMUSCULAR | Status: DC | PRN

## 2022-07-01 NOTE — H&P (Signed)
History and Physical    Derrick Moore FMB:[id number] DOB: 1966-06-27 DOA: 07/01/2022  PCP: [name], PA-C  Patient coming from: Home  I have personally briefly reviewed patient's old medical records available.   Chief Complaint: Hip pain, unable to walk for many months.  Worse for 1 week.  HPI: Derrick Moore is a 56 y.o. male with medical history significant of ESRD on peritoneal dialysis, chronic bilateral hip pain, anemia of chronic disease, hypertension who presents to the emergency room with complaints of ongoing bilateral hip pain for the last several months, worse recently.  His pain got worse after he bent over to pick up an object last week.   Complaints of moderate pain on mobility, recently worsening and going down to both of his thighs.  No incontinence.  Taking Tylenol Motrin with some relief.  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 Denies any nausea or vomiting.  Appetite is fair.  Receives peritoneal dialysis at home and goes to dialysis center every month and also gets Epogen injections every month.  ED Course: Hemodynamically stable.  Hemoglobin 5.2 with baseline hemoglobin about 7.  Potassium is normal.  Skeletal survey including both hip x-ray, recent CT scan of the pelvis from 11/28 shows iliac crest healing fracture, multiple osteodystrophy lesions, no hip fracture.  Started on 2 units of PRBC transfusion and admission requested.  Nephrology consulted.  Review of Systems: all systems are reviewed and pertinent positive as per HPI otherwise rest are negative.    Past Medical History:  Diagnosis Date   Dialysis patient [facility])    Hyperlipidemia    Hypertension    Renal failure    Renal failure    Stroke [facility])     History reviewed. No pertinent surgical history.  Social history   reports that he has been smoking cigarettes. He has a 8.25 pack-year  smoking history. He has never used smokeless tobacco. He reports that he does not currently use alcohol after a past usage of about 1.0 standard drink of alcohol per week. He reports current drug use. Drug: Marijuana.  No Known Allergies  History reviewed. No pertinent family history.   Prior to Admission medications   Medication Sig Start Date End Date Taking? Authorizing Provider  acetaminophen (TYLENOL) 325 MG tablet Take 2 tablets (650 mg total) by mouth every 6 (six) hours as needed for moderate pain. 02/24/22   Donne Hazel, MD  amLODipine (NORVASC) 5 MG tablet Take 5 mg by mouth at bedtime.    [provider]  cinacalcet (SENSIPAR) 60 MG tablet Take 60 mg by mouth daily.    [provider]  clopidogrel (PLAVIX) 75 MG tablet Take 1 tablet (75 mg total) by mouth daily. 03/05/22   Saguier, Percell Miller, PA-C  HYDROcodone-acetaminophen (NORCO/VICODIN) 5-325 MG tablet Take 1 tablet by mouth every 8 (eight) hours as needed. 03/13/22   Rosemarie Ax, MD  labetalol (NORMODYNE) 200 MG tablet Take 200 mg by mouth 2 (two) times daily.    [provider]  lidocaine (LIDODERM) 5 % Place 1 patch onto the skin daily. Remove & Discard patch within 12 hours or as directed by MD 02/12/22   Lennice Sites, DO  lisinopril (ZESTRIL) 30 MG tablet Take 30 mg by mouth at bedtime. 07/04/20   [provider]  methocarbamol (ROBAXIN) 500 MG tablet Take 1 tablet (500 mg total)  by mouth every 8 (eight) hours as needed for muscle spasms. 01/19/22   Long, Wonda Olds, MD  naproxen (NAPROSYN) 500 MG tablet Take 1 tablet by mouth twice daily 06/13/22   Saguier, Percell Miller, PA-C  ondansetron (ZOFRAN) 4 MG tablet Take 1 tablet (4 mg total) by mouth every 6 (six) hours. 03/16/22   Curatolo, Adam, DO  sodium polystyrene (KAYEXALATE) 15 GM/60ML suspension Take 60 ml by mouth daily for 2 days. 03/10/22   [name], PA-C    Physical Exam: Vitals:   07/01/22 1630 07/01/22 1645 07/01/22 1652  07/01/22 1757  BP: 115/76 116/84  113/79  Pulse: 76 76  87  Resp: 14 13  12   Temp:   98.6 F (37 C) (!) 97.4 F (36.3 C)  TempSrc:   Oral Axillary  SpO2: 100% 99%  100%    Constitutional: NAD, calm, comfortable Vitals:   07/01/22 1630 07/01/22 1645 07/01/22 1652 07/01/22 1757  BP: 115/76 116/84  113/79  Pulse: 76 76  87  Resp: 14 13  12   Temp:   98.6 F (37 C) (!) 97.4 F (36.3 C)  TempSrc:   Oral Axillary  SpO2: 100% 99%  100%   Eyes: PERRL, lids and conjunctivae normal.  Pale. ENMT: Mucous membranes are moist. Posterior pharynx clear of any exudate or lesions.Normal dentition.  Neck: normal, supple, no masses, no thyromegaly Respiratory: clear to auscultation bilaterally, no wheezing, no crackles. Normal respiratory effort. No accessory muscle use.  Cardiovascular: Regular rate and rhythm, no murmurs / rubs / gallops. No extremity edema. 2+ pedal pulses. No carotid bruits.  Abdomen: no tenderness, no masses palpated. No hepatosplenomegaly. Bowel sounds positive.  PD catheter present.  Intact and dry. Musculoskeletal: no clubbing / cyanosis. No joint deformity upper and lower extremities. Good ROM, no contractures. Normal muscle tone.  Skin: no rashes, lesions, ulcers. No induration Neurologic: CN 2-12 grossly intact. Sensation intact, DTR normal. Strength 5/5 in all 4.  Psychiatric: Normal judgment and insight. Alert and oriented x 3. Normal mood.   No pain on compression of the pelvis.  No localized tenderness.  Hip joint range of motion is normal with some discomfort on the pelvis.   Labs on Admission: I have personally reviewed following labs and imaging studies  CBC: Recent Labs  Lab 07/01/22 1511  WBC 5.7  NEUTROABS 3.9  HGB 5.2*  HCT 16.4*  MCV 95.9  PLT 262   Basic Metabolic Panel: Recent Labs  Lab 07/01/22 1511  NA 136  K 5.0  CL 108  CO2 12*  GLUCOSE 98  BUN 111*  CREATININE 19.02*  CALCIUM 7.4*   GFR: Estimated Creatinine Clearance: 4.3  mL/min (A) (by C-G formula based on SCr of 19.02 mg/dL (H)). Liver Function Tests: No results for input(s): "AST", "ALT", "ALKPHOS", "BILITOT", "PROT", "ALBUMIN" in the last 168 hours. No results for input(s): "LIPASE", "AMYLASE" in the last 168 hours. No results for input(s): "AMMONIA" in the last 168 hours. Coagulation Profile: No results for input(s): "INR", "PROTIME" in the last 168 hours. Cardiac Enzymes: No results for input(s): "CKTOTAL", "CKMB", "CKMBINDEX", "TROPONINI" in the last 168 hours. BNP (last 3 results) No results for input(s): "PROBNP" in the last 8760 hours. HbA1C: No results for input(s): "HGBA1C" in the last 72 hours. CBG: No results for input(s): "GLUCAP" in the last 168 hours. Lipid Profile: No results for input(s): "CHOL", "HDL", "LDLCALC", "TRIG", "CHOLHDL", "LDLDIRECT" in the last 72 hours. Thyroid Function Tests: No results for input(s): "TSH", "T4TOTAL", "FREET4", "T3FREE", "  THYROIDAB" in the last 72 hours. Anemia Panel: No results for input(s): "VITAMINB12", "FOLATE", "FERRITIN", "TIBC", "IRON", "RETICCTPCT" in the last 72 hours. Urine analysis: No results found for: "COLORURINE", "APPEARANCEUR", "LABSPEC", "PHURINE", "GLUCOSEU", "HGBUR", "BILIRUBINUR", "KETONESUR", "PROTEINUR", "UROBILINOGEN", "NITRITE", "LEUKOCYTESUR"  Radiological Exams on Admission: DG Lumbar Spine Complete  Result Date: 07/01/2022 CLINICAL DATA:  Lower back pain and bilateral hip pain. EXAM: LUMBAR SPINE - COMPLETE 4+ VIEW COMPARISON:  Lumbar spine radiographs 12/05/2021 FINDINGS: There are 5 non-rib-bearing lumbar-type vertebral bodies. Normal frontal alignment. No sagittal spondylolisthesis vertebral body heights are maintained. Mild L5-S1 and minimal posterior L4-5 disc space narrowing. There is again increased sclerosis within the superior and inferior endplates of the vertebral bodies ("rugger [place] spine "). No acute fracture is seen. IMPRESSION: Mild L5-S1 and minimal L4-5  degenerative disc changes. "[place] spine" consistent with sequela of chronic renal osteodystrophy. Electronically Signed   By: Yvonne Kendall M.D.   On: 07/01/2022 12:09   DG Hip Unilat W or Wo Pelvis 2-3 Views Right  Result Date: 07/01/2022 CLINICAL DATA:  Back pain and bilateral hip pain radiating down the legs for the past 5 months. EXAM: DG HIP (WITH OR WITHOUT PELVIS) 2-3V RIGHT; DG HIP (WITH OR WITHOUT PELVIS) 2-3V LEFT COMPARISON:  Right hip radiographs 06/24/2022 and pelvis and left hip radiographs 01/19/2022; CT pelvis 06/24/2022 FINDINGS: A peritoneal dialysis catheter is again coiled overlying the mid pelvis. Moderate to high-grade atherosclerotic calcifications. No significant change in right-greater-than-left pubic body erosion and abnormal approximate 2 cm pubic symphysis diastasis compared to recent 06/24/2022 radiographs. This is again worsened from 02/18/2022 radiographs. Patchy lucencies overlying the bilateral sacroiliac joints, better seen on prior CT. A subacute fracture of the left iliac wing is again noted, with approximately 8 mm diastasis throughout the fracture extending from the inferior aspect of the left sacroiliac joint through the left superior iliac crest. 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 IMPRESSION: 1. No significant change in right-greater-than-left pubic body erosion and abnormal pubic symphysis diastasis compared to recent 06/24/2022 radiographs. 2. Subacute fracture of the left iliac wing with approximately 8 mm diastasis throughout the fracture. This was seen on recent CT. 3. Additional bilateral sacroiliac joint erosions. 4. All the pelvic erosions may be secondary to chronic renal osteodystrophy and secondary hyperparathyroidism. The weakened bone likely contributed to the left iliac wing subacute suspected insufficiency fracture. Electronically Signed   By:  Yvonne Kendall M.D.   On: 07/01/2022 12:05   DG Hip Unilat W or Wo Pelvis 2-3 Views Left  Result Date: 07/01/2022 CLINICAL DATA:  Back pain and bilateral hip pain radiating down the legs for the past 5 months. EXAM: DG HIP (WITH OR WITHOUT PELVIS) 2-3V RIGHT; DG HIP (WITH OR WITHOUT PELVIS) 2-3V LEFT COMPARISON:  Right hip radiographs 06/24/2022 and pelvis and left hip radiographs 01/19/2022; CT pelvis 06/24/2022 FINDINGS: A peritoneal dialysis catheter is again coiled overlying the mid pelvis. Moderate to high-grade atherosclerotic calcifications. No significant change in right-greater-than-left pubic body erosion and abnormal approximate 2 cm pubic symphysis diastasis compared to recent 06/24/2022 radiographs. This is again worsened from 02/18/2022 radiographs. Patchy lucencies overlying the bilateral sacroiliac joints, better seen on prior CT. A subacute fracture of the left iliac wing is again noted, with approximately 8 mm diastasis throughout the fracture extending from the inferior aspect of the left sacroiliac joint through the left superior iliac crest. 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 IMPRESSION: 1. No significant change in right-greater-than-left pubic body erosion and abnormal pubic symphysis diastasis compared to recent 06/24/2022 radiographs. 2. Subacute fracture of the left iliac wing with approximately 8 mm diastasis throughout the fracture. This was seen on recent CT. 3. Additional bilateral sacroiliac joint erosions. 4. All the pelvic erosions may be secondary to chronic renal osteodystrophy and secondary hyperparathyroidism. The weakened bone likely contributed to the left iliac wing subacute suspected insufficiency fracture. Electronically Signed   By: Yvonne Kendall M.D.   On: 07/01/2022 12:05    EKG: Independently reviewed.  Sinus rhythm.  Assessment/Plan Principal Problem:    Symptomatic anemia Active Problems:   Hypertension   ESRD on dialysis [facility])   Dyslipidemia   Sacroiliac joint dysfunction of right side   Ambulatory dysfunction     1.  Symptomatic anemia, anemia of chronic disease due to ESRD: 2 units of PRBC started in the ER.  Recheck hemoglobin.  May need Epogen with hemodialysis.  2.  Ambulatory dysfunction, severe osteodystrophy of the pelvis bones: Calcium is normal.  PTH pending.  Severe metabolic bone disease.  May need more aggressive therapy for metabolic bone disease. Nephrology following. Pain relief with opiates and Tylenol.  Mobilize with PT OT.  May benefit with home health PT OT.  3.  Essential hypertension: Resume home medications: Lisinopril.  4.  ESRD on peritoneal dialysis: Potassium is normal.  Nephrology to see.  5.  Smoker: Counseled to quit.  Offered nicotine patch.  Declined.   DVT prophylaxis: Heparin subcu.  No active bleeding Code Status: Full code Family Communication: None at the bedside. Disposition Plan: Home after medical stabilization.  Anticipate home health PT OT. Consults called: Nephrology, called by ER Admission status: Observation.  Medical telemetry.   Barb Merino MD [facility] Pager [phone]

## 2022-07-01 NOTE — ED Triage Notes (Signed)
Pt arrived POV from home c/o difficulty walking and pain in his hips and legs since June.

## 2022-07-01 NOTE — ED Provider Triage Note (Signed)
Emergency Medicine Provider Triage Evaluation Note  Derrick Moore , a 56 y.o. male  was evaluated in triage.  Pt complains of bilateral hip pain rating down his legs that been present since June or July.  He is to the point where he is having trouble walking.  He denies any back pain.   Review of Systems  Positive:  Negative:   Physical Exam  BP 106/77 (BP Location: Right Arm)   Pulse 85   Temp 98.3 F (36.8 C) (Oral)   Resp 16   SpO2 100%  Gen:   Awake, no distress   Resp:  Normal effort  MSK:   Moves extremities without difficulty  Other:  No midline lumbar tenderness or paraspinal muscular tenderness  Medical Decision Making  Medically screening exam initiated at 9:49 AM.  Appropriate orders placed.  Derrick Moore was informed that the remainder of the evaluation will be completed by another provider, this initial triage assessment does not replace that evaluation, and the importance of remaining in the ED until their evaluation is complete.     Derrick Moore, [place] 07/01/22 [phone]

## 2022-07-01 NOTE — ED Notes (Addendum)
Pt c/o pain in bilateral hips walking that started back in the summer and is increasingly worse. Pt reports being seen at [facility] least week for the bilateral hips pain and reports abnormal labs. Pt states he is an at Peritoneal at home dialysis pt. Pt states he is concerned about having the difficulty walking. Pt states he has been using cane/walker with ambulatuion. MRI and XRAY in August.

## 2022-07-01 NOTE — ED Notes (Signed)
Upon this RN taking over- blood transfusion continues running

## 2022-07-01 NOTE — ED Notes (Signed)
Labs can not be obtained during blood transfusion

## 2022-07-02 DIAGNOSIS — D631 Anemia in chronic kidney disease: Secondary | ICD-10-CM | POA: Diagnosis not present

## 2022-07-02 DIAGNOSIS — D649 Anemia, unspecified: Secondary | ICD-10-CM | POA: Diagnosis not present

## 2022-07-02 DIAGNOSIS — M25559 Pain in unspecified hip: Secondary | ICD-10-CM | POA: Diagnosis not present

## 2022-07-02 DIAGNOSIS — I120 Hypertensive chronic kidney disease with stage 5 chronic kidney disease or end stage renal disease: Secondary | ICD-10-CM | POA: Diagnosis not present

## 2022-07-02 DIAGNOSIS — N250 Renal osteodystrophy: Secondary | ICD-10-CM | POA: Diagnosis not present

## 2022-07-02 DIAGNOSIS — Z992 Dependence on renal dialysis: Secondary | ICD-10-CM | POA: Diagnosis not present

## 2022-07-02 DIAGNOSIS — N186 End stage renal disease: Secondary | ICD-10-CM

## 2022-07-02 LAB — CBC
HCT: 19.4 % — ABNORMAL LOW (ref 39.0–52.0)
Hemoglobin: 6.5 g/dL — CL (ref 13.0–17.0)
MCH: 30.8 pg (ref 26.0–34.0)
MCHC: 33.5 g/dL (ref 30.0–36.0)
MCV: 91.9 fL (ref 80.0–100.0)
Platelets: 155 10*3/uL (ref 150–400)
RBC: 2.11 MIL/uL — ABNORMAL LOW (ref 4.22–5.81)
RDW: 16.1 % — ABNORMAL HIGH (ref 11.5–15.5)
WBC: 4.6 10*3/uL (ref 4.0–10.5)
nRBC: 0 % (ref 0.0–0.2)

## 2022-07-02 LAB — RENAL FUNCTION PANEL
Albumin: 2.7 g/dL — ABNORMAL LOW (ref 3.5–5.0)
Anion gap: 15 (ref 5–15)
BUN: 112 mg/dL — ABNORMAL HIGH (ref 6–20)
CO2: 13 mmol/L — ABNORMAL LOW (ref 22–32)
Calcium: 6.9 mg/dL — ABNORMAL LOW (ref 8.9–10.3)
Chloride: 104 mmol/L (ref 98–111)
Creatinine, Ser: 18.36 mg/dL — ABNORMAL HIGH (ref 0.61–1.24)
GFR, Estimated: 3 mL/min — ABNORMAL LOW (ref >60)
Glucose, Bld: 92 mg/dL (ref 70–99)
Phosphorus: 8.6 mg/dL — ABNORMAL HIGH (ref 2.5–4.6)
Potassium: 4.8 mmol/L (ref 3.5–5.1)
Sodium: 132 mmol/L — ABNORMAL LOW (ref 135–145)

## 2022-07-02 LAB — HEMOGLOBIN AND HEMATOCRIT, BLOOD
HCT: 25 % — ABNORMAL LOW (ref 39.0–52.0)
Hemoglobin: 8.1 g/dL — ABNORMAL LOW (ref 13.0–17.0)

## 2022-07-02 LAB — PREPARE RBC (CROSSMATCH)

## 2022-07-02 MED ORDER — PRAVASTATIN SODIUM 10 MG PO TABS
20.0000 mg | ORAL_TABLET | Freq: Every day | ORAL | Status: DC
  Administered 2022-07-02 – 2022-07-03 (×2): 20 mg via ORAL
  Filled 2022-07-02 (×2): qty 2

## 2022-07-02 MED ORDER — SODIUM CHLORIDE 0.9% IV SOLUTION: Freq: Once | INTRAVENOUS | Status: AC

## 2022-07-02 MED ORDER — SODIUM CHLORIDE 0.9 % IV SOLN
20.0000 ug | Freq: Once | INTRAVENOUS | Status: DC
  Filled 2022-07-02: qty 5

## 2022-07-02 MED ORDER — DELFLEX-LC/1.5% DEXTROSE 344 MOSM/L IP SOLN: INTRAPERITONEAL | Status: DC

## 2022-07-02 MED ORDER — DARBEPOETIN ALFA 300 MCG/0.6ML IJ SOSY
300.0000 ug | PREFILLED_SYRINGE | Freq: Once | INTRAMUSCULAR | Status: DC
  Filled 2022-07-02: qty 0.6

## 2022-07-02 MED ORDER — SODIUM BICARBONATE 650 MG PO TABS
650.0000 mg | ORAL_TABLET | Freq: Three times a day (TID) | ORAL | Status: DC
  Administered 2022-07-02 – 2022-07-04 (×7): 650 mg via ORAL
  Filled 2022-07-02 (×7): qty 1

## 2022-07-02 MED ORDER — GENTAMICIN SULFATE 0.1 % EX CREA
1.0000 | TOPICAL_CREAM | Freq: Every day | CUTANEOUS | Status: DC
  Administered 2022-07-03 – 2022-07-04 (×3): 1 via TOPICAL
  Filled 2022-07-02: qty 15

## 2022-07-02 MED ORDER — DELFLEX-LC/2.5% DEXTROSE 394 MOSM/L IP SOLN: INTRAPERITONEAL | Status: DC

## 2022-07-02 NOTE — Assessment & Plan Note (Addendum)
Blood pressure within goal. -Continue home lisinopril and amlodipine

## 2022-07-02 NOTE — Consult Note (Addendum)
Referring Provider: Dr. Reesa Chew, [facility] Primary Care Physician:  Elise Benne Primary Gastroenterologist:  Althia Forts  Reason for Consultation:  Anemia  HPI: Derrick Moore is a 56 y.o. male with past medical history of end-stage renal disease on PD for several years, hypertension, hyperlipidemia, history of CVA on Plavix who presented to the emergency department with complaints of hip pain.  Found to have a hemoglobin of 5.2 g.  Has been given 2 units of packed red blood cells with an increase to 6.5 g so is going to receive a third unit.  8 days ago his hemoglobin was 6.1 g.  Has never seen GI or had a colonoscopy in the past.  He tells me that pretty much regularly when he has a bowel movement he sees bright red blood on the toilet paper when he wipes, sometimes noted in the stool as well.  He also reports intermittent black stools.  He denies any constipation or diarrhea.  No abdominal pain.  No nausea or vomiting.  He says he smokes marijuana and that increases his appetite.  Denies any heartburn, reflux, difficulty or painful swallowing.  Denies NSAID use.  Iron studies are pending.  Renal has seen and has placed him on DDAVP and Aranesp.   Past Medical History:  Diagnosis Date   Dialysis patient [facility])    Hyperlipidemia    Hypertension    Renal failure    Renal failure    Stroke [facility])     History reviewed. No pertinent surgical history.  Prior to Admission medications   Medication Sig Start Date End Date Taking? Authorizing Provider  amLODipine (NORVASC) 5 MG tablet Take 5 mg by mouth at bedtime.   Yes [provider]  cinacalcet (SENSIPAR) 60 MG tablet Take 60 mg by mouth daily.   Yes [provider]  clopidogrel (PLAVIX) 75 MG tablet Take 1 tablet (75 mg total) by mouth daily. 03/05/22  Yes Saguier, Percell Miller, PA-C  HYDROcodone-acetaminophen (NORCO/VICODIN) 5-325 MG tablet Take 1 tablet by mouth every 8 (eight) hours as needed. 03/13/22  Yes Rosemarie Ax, MD  ibuprofen (ADVIL) 200 MG tablet Take 200 mg by mouth every 6 (six) hours as needed for headache or mild pain.   Yes [provider]  labetalol (NORMODYNE) 200 MG tablet Take 200 mg by mouth 2 (two) times daily.   Yes [provider]  lisinopril (ZESTRIL) 30 MG tablet Take 30 mg by mouth at bedtime. 07/04/20  Yes [provider]  naproxen (NAPROSYN) 500 MG tablet Take 1 tablet by mouth twice daily 06/13/22  Yes Saguier, Percell Miller, PA-C  pravastatin (PRAVACHOL) 20 MG tablet Take by mouth. 05/19/19  Yes [provider]  PRESCRIPTION MEDICATION Tylenol 3   Yes [provider]  lidocaine (LIDODERM) 5 % Place 1 patch onto the skin daily. Remove & Discard patch within 12 hours or as directed by MD Patient not taking: Reported on 07/01/2022 02/12/22   Curatolo, Adam, DO  ondansetron (ZOFRAN) 4 MG tablet Take 1 tablet (4 mg total) by mouth every 6 (six) hours. Patient not taking: Reported on 07/01/2022 03/16/22   Lennice Sites, DO  sodium polystyrene (KAYEXALATE) 15 GM/60ML suspension Take 60 ml by mouth daily for 2 days. Patient not taking: Reported on 07/01/2022 03/10/22   Saguier, Percell Miller, PA-C    Current Facility-Administered Medications  Medication Dose Route Frequency Provider Last Rate Last Admin   acetaminophen (TYLENOL) tablet 650 mg  650 mg Oral Q6H PRN Barb Merino, MD  Or   acetaminophen (TYLENOL) suppository 650 mg  650 mg Rectal Q6H PRN Barb Merino, MD       amLODipine (NORVASC) tablet 5 mg  5 mg Oral Daily Barb Merino, MD   5 mg at 07/02/22 6568   cinacalcet (SENSIPAR) tablet 60 mg  60 mg Oral Q breakfast Barb Merino, MD   60 mg at 07/02/22 0815   clopidogrel (PLAVIX) tablet 75 mg  75 mg Oral Daily Barb Merino, MD   75 mg at 07/02/22 1275   Darbepoetin Alfa (ARANESP) injection 300 mcg  300 mcg Subcutaneous Once Corliss Parish, MD       desmopressin (DDAVP) 20 mcg in sodium chloride 0.9 % 50 mL IVPB  20 mcg Intravenous  Once Corliss Parish, MD       dialysis solution 1.5% low-MG/low-CA dianeal solution   Intraperitoneal Q24H Corliss Parish, MD       dialysis solution 2.5% low-MG/low-CA dianeal solution   Intraperitoneal Q24H Corliss Parish, MD       gentamicin cream (GARAMYCIN) 0.1 % 1 Application  1 Application Topical Daily Corliss Parish, MD       heparin injection 5,000 Units  5,000 Units Subcutaneous Q8H Barb Merino, MD   5,000 Units at 07/02/22 0537   labetalol (NORMODYNE) tablet 200 mg  200 mg Oral BID Barb Merino, MD   200 mg at 07/02/22 0927   lisinopril (ZESTRIL) tablet 30 mg  30 mg Oral QHS Barb Merino, MD   30 mg at 07/01/22 2208   methocarbamol (ROBAXIN) tablet 500 mg  500 mg Oral Q8H PRN Barb Merino, MD       ondansetron (ZOFRAN) tablet 4 mg  4 mg Oral Q6H PRN Barb Merino, MD       Or   ondansetron (ZOFRAN) injection 4 mg  4 mg Intravenous Q6H PRN Barb Merino, MD       oxyCODONE (Oxy IR/ROXICODONE) immediate release tablet 5 mg  5 mg Oral Q4H PRN Barb Merino, MD   5 mg at 07/01/22 2208   oxyCODONE-acetaminophen (PERCOCET/ROXICET) 5-325 MG per tablet 1 tablet  1 tablet Oral Q4H PRN Barb Merino, MD   1 tablet at 07/01/22 1656   pravastatin (PRAVACHOL) tablet 20 mg  20 mg Oral q1800 Lorella Nimrod, MD       sodium bicarbonate tablet 650 mg  650 mg Oral TID Lorella Nimrod, MD   650 mg at 07/02/22 1700   Current Outpatient Medications  Medication Sig Dispense Refill   amLODipine (NORVASC) 5 MG tablet Take 5 mg by mouth at bedtime.     cinacalcet (SENSIPAR) 60 MG tablet Take 60 mg by mouth daily.     clopidogrel (PLAVIX) 75 MG tablet Take 1 tablet (75 mg total) by mouth daily. 90 tablet 3   HYDROcodone-acetaminophen (NORCO/VICODIN) 5-325 MG tablet Take 1 tablet by mouth every 8 (eight) hours as needed. 15 tablet 0   ibuprofen (ADVIL) 200 MG tablet Take 200 mg by mouth every 6 (six) hours as needed for headache or mild pain.     labetalol (NORMODYNE) 200 MG  tablet Take 200 mg by mouth 2 (two) times daily.     lisinopril (ZESTRIL) 30 MG tablet Take 30 mg by mouth at bedtime.     naproxen (NAPROSYN) 500 MG tablet Take 1 tablet by mouth twice daily 60 tablet 0   pravastatin (PRAVACHOL) 20 MG tablet Take by mouth.     PRESCRIPTION MEDICATION Tylenol 3     lidocaine (LIDODERM) 5 % Place  1 patch onto the skin daily. Remove & Discard patch within 12 hours or as directed by MD (Patient not taking: Reported on 07/01/2022) 30 patch 0   ondansetron (ZOFRAN) 4 MG tablet Take 1 tablet (4 mg total) by mouth every 6 (six) hours. (Patient not taking: Reported on 07/01/2022) 12 tablet 0   sodium polystyrene (KAYEXALATE) 15 GM/60ML suspension Take 60 ml by mouth daily for 2 days. (Patient not taking: Reported on 07/01/2022) 240 mL 0    Allergies as of 07/01/2022   (No Known Allergies)    History reviewed. No pertinent family history.  Social History   Socioeconomic History   Marital status: Single    Spouse name: Not on file   Number of children: Not on file   Years of education: Not on file   Highest education level: Not on file  Occupational History   Not on file  Tobacco Use   Smoking status: Every Day    Packs/day: 0.25    Years: 33.00    Total pack years: 8.25    Types: Cigarettes   Smokeless tobacco: Never  Vaping Use   Vaping Use: Never used  Substance and Sexual Activity   Alcohol use: Not Currently    Alcohol/week: 1.0 standard drink of alcohol    Types: 1 Cans of beer per week   Drug use: Yes    Types: Marijuana   Sexual activity: Not on file  Other Topics Concern   Not on file  Social History Narrative   Not on file   Social Determinants of Health   Financial Resource Strain: Low Risk  (04/03/2022)   Overall Financial Resource Strain (CARDIA)    Difficulty of Paying Living Expenses: Not hard at all  Food Insecurity: No Food Insecurity (02/27/2022)   Hunger Vital Sign    Worried About Running Out of Food in the Last Year: Never  true    Ran Out of Food in the Last Year: Never true  Transportation Needs: No Transportation Needs (02/27/2022)   PRAPARE - Hydrologist (Medical): No    Lack of Transportation (Non-Medical): No  Physical Activity: Not on file  Stress: No Stress Concern Present (04/03/2022)   [place]    Feeling of Stress : Not at all  Social Connections: Socially Isolated (04/03/2022)   Social Connection and Isolation Panel [NHANES]    Frequency of Communication with Friends and Family: More than three times a week    Frequency of Social Gatherings with Friends and Family: More than three times a week    Attends Religious Services: Never    Marine scientist or Organizations: No    Attends Archivist Meetings: Never    Marital Status: Divorced  Human resources officer Violence: Not At Risk (04/03/2022)   Humiliation, Afraid, Rape, and Kick questionnaire    Fear of Current or Ex-Partner: No    Emotionally Abused: No    Physically Abused: No    Sexually Abused: No    Review of Systems: ROS is O/W negative except as mentioned in HPI.  Physical Exam: Vital signs in last 24 hours: Temp:  [97.4 F (36.3 C)-99.1 F (37.3 C)] 97.7 F (36.5 C) (12/06 1200) Pulse Rate:  [72-102] 80 (12/06 1430) Resp:  [10-26] 11 (12/06 1430) BP: (97-145)/(60-112) 126/91 (12/06 1430) SpO2:  [95 %-100 %] 97 % (12/06 1430)   General:  Alert, Well-developed, well-nourished, pleasant and cooperative in  NAD Head:  Normocephalic and atraumatic. Eyes:  Sclera clear, no icterus.  Conjunctiva pink. Ears:  Normal auditory acuity. Mouth:  No deformity or lesions.   Lungs:  Clear throughout to auscultation.  No wheezes, crackles, or rhonchi.  Heart:  Regular rate and rhythm; no murmurs, clicks, rubs, or gallops. Abdomen:  Soft, non-distended.  BS present.  Non-tender. Rectal:  Deferred  Msk:  Symmetrical without gross  deformities. Pulses:  Normal pulses noted. Extremities:  Without clubbing or edema. Neurologic:  Alert and  oriented x4;  grossly normal neurologically. Skin:  Intact without significant lesions or rashes. Psych:  Alert and cooperative. Normal mood and affect.  Intake/Output from previous day: 12/05 0701 - 12/06 0700 In: 398 [Blood:398] Out: -   Lab Results: Recent Labs    07/01/22 1511 07/02/22 0310 07/02/22 1221  WBC 5.7 4.6  --   HGB 5.2* 6.5* 8.1*  HCT 16.4* 19.4* 25.0*  PLT 174 155  --    BMET Recent Labs    07/01/22 1511 07/02/22 0310  NA 136 132*  K 5.0 4.8  CL 108 104  CO2 12* 13*  GLUCOSE 98 92  BUN 111* 112*  CREATININE 19.02* 18.36*  CALCIUM 7.4* 6.9*   LFT Recent Labs    07/02/22 0310  ALBUMIN 2.7*   Studies/Results: DG Lumbar Spine Complete  Result Date: 07/01/2022 CLINICAL DATA:  Lower back pain and bilateral hip pain. EXAM: LUMBAR SPINE - COMPLETE 4+ VIEW COMPARISON:  Lumbar spine radiographs 12/05/2021 FINDINGS: There are 5 non-rib-bearing lumbar-type vertebral bodies. Normal frontal alignment. No sagittal spondylolisthesis vertebral body heights are maintained. Mild L5-S1 and minimal posterior L4-5 disc space narrowing. There is again increased sclerosis within the superior and inferior endplates of the vertebral bodies ("rugger [place] spine "). No acute fracture is seen. IMPRESSION: Mild L5-S1 and minimal L4-5 degenerative disc changes. "[place] spine" consistent with sequela of chronic renal osteodystrophy. Electronically Signed   By: Yvonne Kendall M.D.   On: 07/01/2022 12:09   DG Hip Unilat W or Wo Pelvis 2-3 Views Right  Result Date: 07/01/2022 CLINICAL DATA:  Back pain and bilateral hip pain radiating down the legs for the past 5 months. EXAM: DG HIP (WITH OR WITHOUT PELVIS) 2-3V RIGHT; DG HIP (WITH OR WITHOUT PELVIS) 2-3V LEFT COMPARISON:  Right hip radiographs 06/24/2022 and pelvis and left hip radiographs 01/19/2022; CT pelvis  06/24/2022 FINDINGS: A peritoneal dialysis catheter is again coiled overlying the mid pelvis. Moderate to high-grade atherosclerotic calcifications. No significant change in right-greater-than-left pubic body erosion and abnormal approximate 2 cm pubic symphysis diastasis compared to recent 06/24/2022 radiographs. This is again worsened from 02/18/2022 radiographs. Patchy lucencies overlying the bilateral sacroiliac joints, better seen on prior CT. A subacute fracture of the left iliac wing is again noted, with approximately 8 mm diastasis throughout the fracture extending from the inferior aspect of the left sacroiliac joint through the left superior iliac crest. 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 IMPRESSION: 1. No significant change in right-greater-than-left pubic body erosion and abnormal pubic symphysis diastasis compared to recent 06/24/2022 radiographs. 2. Subacute fracture of the left iliac wing with approximately 8 mm diastasis throughout the fracture. This was seen on recent CT. 3. Additional bilateral sacroiliac joint erosions. 4. All the pelvic erosions may be secondary to chronic renal osteodystrophy and secondary hyperparathyroidism. The weakened bone likely contributed to the left iliac wing subacute suspected insufficiency fracture.  Electronically Signed   By: Yvonne Kendall M.D.   On: 07/01/2022 12:05   DG Hip Unilat W or Wo Pelvis 2-3 Views Left  Result Date: 07/01/2022 CLINICAL DATA:  Back pain and bilateral hip pain radiating down the legs for the past 5 months. EXAM: DG HIP (WITH OR WITHOUT PELVIS) 2-3V RIGHT; DG HIP (WITH OR WITHOUT PELVIS) 2-3V LEFT COMPARISON:  Right hip radiographs 06/24/2022 and pelvis and left hip radiographs 01/19/2022; CT pelvis 06/24/2022 FINDINGS: A peritoneal dialysis catheter is again coiled overlying the mid pelvis. Moderate to high-grade atherosclerotic  calcifications. No significant change in right-greater-than-left pubic body erosion and abnormal approximate 2 cm pubic symphysis diastasis compared to recent 06/24/2022 radiographs. This is again worsened from 02/18/2022 radiographs. Patchy lucencies overlying the bilateral sacroiliac joints, better seen on prior CT. A subacute fracture of the left iliac wing is again noted, with approximately 8 mm diastasis throughout the fracture extending from the inferior aspect of the left sacroiliac joint through the left superior iliac crest. 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 IMPRESSION: 1. No significant change in right-greater-than-left pubic body erosion and abnormal pubic symphysis diastasis compared to recent 06/24/2022 radiographs. 2. Subacute fracture of the left iliac wing with approximately 8 mm diastasis throughout the fracture. This was seen on recent CT. 3. Additional bilateral sacroiliac joint erosions. 4. All the pelvic erosions may be secondary to chronic renal osteodystrophy and secondary hyperparathyroidism. The weakened bone likely contributed to the left iliac wing subacute suspected insufficiency fracture. Electronically Signed   By: Yvonne Kendall M.D.   On: 07/01/2022 12:05    IMPRESSION:  *Normocytic anemia in the setting of end-stage renal disease: This is likely multifactorial, anemia of chronic disease/chronic kidney disease, but reports intermittent rectal bleeding that occurs daily upon wiping as well as occasional black stools.  Hemoglobin 5.2 g here, up to 6.5 g after 2 units of packed red blood cells so a 3rd unit has been ordered.  Iron studies are pending. *History of CVA on Plavix *End-stage renal disease on PD  PLAN: -Agree with transfusions and monitor Hgb. -Renal has ordered Aranesp and DDAVP. -Follow-up iron studies.  May want to check Vitamin B12 and folate levels as  well. -Can plan for EGD and colonoscopy at some point, but if able to hold Plavix then will need a 5-day washout, so will plan for Monday.   Laban Emperor. Zehr  07/02/2022, 3:05 PM   Attending physician's note  I have taken a history, reviewed the chart and examined the patient. I performed a substantive portion of this encounter, including complete performance of at least one of the key components, in conjunction with the APP. I agree with the APP's note, impression and recommendations.    56 year old male with end-stage renal disease on peritoneal dialysis, history of CVA on Plavix admitted with severe anemia, hemoglobin 5.2 He has intermittent bright red blood per rectum and also intermittent black stool  Will plan to proceed with EGD and colonoscopy for further evaluation for source of GI bleed and therapeutic intervention as needed Will need to wait for 5 days for Plavix washout  Follow-up iron panel, B12, IV iron infusion as needed Renal has ordered Aranesp and DDAVP  Continue diet Will order bowel prep over the weekend and plan for EGD and colonoscopy on Monday  The patient was provided an opportunity to ask questions and all were answered. The patient agreed  with the plan and demonstrated an understanding of the instructions.  Damaris Hippo , MD [phone]

## 2022-07-02 NOTE — Assessment & Plan Note (Signed)
Significantly elevated BUN and creatinine.  Patient is doing peritoneal dialysis for the past many years.  Not sure about the compliance. -Nephrology was consulted-appreciate their help

## 2022-07-02 NOTE — ED Notes (Signed)
Called to activate the purple man 

## 2022-07-02 NOTE — Consult Note (Signed)
Reason for Consult: To manage dialysis and dialysis related needs Referring Physician: [name] is an 56 y.o. male with HTN, current tobacco use and previous CVA.  Also ESRD-  he tells me he has been on PD for at least 15 years-  followed by [facility] unit in [place], [place].  It sounds as if his hgb has been low-  he admits to seeing blood in his stool-  his OP HD unit wanted to get stool cards but he did not get. He seems to utilize the ER a lot for things-  4 visits in the past 4 months and that is just in this health system.  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  BUt labs also show a hgb in the 5's so was given 2 units PRBC.  He is being admitted mostly for this pain-  to get PT.  We are asked to see for PD.  He denies any abdominal pain-  last PD was Monday night   Dialyzes at [facility]  -  says that he only does 3 exchanges in a 24 hour period - does not seem like a lot-   2L 2.5 % fluid- he does manual exchanges -  BUN over 100 so might not be that compliant.  He says they have been giving him iron and ESA at his kidney center     Past Medical History:  Diagnosis Date   Dialysis patient [facility])    Hyperlipidemia    Hypertension    Renal failure    Renal failure    Stroke [facility])     History reviewed. No pertinent surgical history.  History reviewed. No pertinent family history.  Social History:  reports that he has been smoking cigarettes. He has a 8.25 pack-year smoking history. He has never used smokeless tobacco. He reports that he does not currently use alcohol after a past usage of about 1.0 standard drink of alcohol per week. He reports current drug use. Drug: Marijuana.  Allergies: No Known Allergies  Medications: I have reviewed the patient's current medications.  Results for orders placed or performed during the hospital encounter of 07/01/22  (from the past 48 hour(s))  CBC with Differential     Status: Abnormal   Collection Time: 07/01/22  3:11 PM  Result Value Ref Range   WBC 5.7 4.0 - 10.5 K/uL   RBC 1.71 (L) 4.22 - 5.81 MIL/uL   Hemoglobin 5.2 (LL) 13.0 - 17.0 g/dL    Comment: REPEATED TO VERIFY THIS CRITICAL RESULT HAS VERIFIED AND BEEN CALLED TO P FURROW RN BY BONNIE DAVIS ON 12 05 2023 AT [id number], AND HAS BEEN READ BACK.     HCT 16.4 (L) 39.0 - 52.0 %   MCV 95.9 80.0 - 100.0 fL   MCH 30.4 26.0 - 34.0 pg   MCHC 31.7 30.0 - 36.0 g/dL   RDW 16.9 (H) 11.5 - 15.5 %   Platelets 174 150 - 400 K/uL   nRBC 0.0 0.0 - 0.2 %   Neutrophils Relative % 67 %   Neutro Abs 3.9 1.7 - 7.7 K/uL   Lymphocytes Relative 15 %   Lymphs Abs 0.9 0.7 - 4.0 K/uL   Monocytes Relative 11 %   Monocytes Absolute 0.6 0.1 - 1.0 K/uL   Eosinophils Relative 6 %   Eosinophils Absolute 0.4 0.0 -  0.5 K/uL   Basophils Relative 1 %   Basophils Absolute 0.0 0.0 - 0.1 K/uL   Immature Granulocytes 0 %   Abs Immature Granulocytes 0.02 0.00 - 0.07 K/uL    Comment: Performed at [facility], [place] [address], [place], [place] [address]  Basic metabolic panel     Status: Abnormal   Collection Time: 07/01/22  3:11 PM  Result Value Ref Range   Sodium 136 135 - 145 mmol/L   Potassium 5.0 3.5 - 5.1 mmol/L   Chloride 108 98 - 111 mmol/L   CO2 12 (L) 22 - 32 mmol/L   Glucose, Bld 98 70 - 99 mg/dL    Comment: Glucose reference range applies only to samples taken after fasting for at least 8 hours.   BUN 111 (H) 6 - 20 mg/dL   Creatinine, Ser 19.02 (H) 0.61 - 1.24 mg/dL   Calcium 7.4 (L) 8.9 - 10.3 mg/dL   GFR, Estimated 3 (L) >60 mL/min    Comment: (NOTE) Calculated using the CKD-EPI Creatinine Equation (2021)    Anion gap 16 (H) 5 - 15    Comment: Performed at [facility] [address] [place], [place] [address]  Prepare RBC (crossmatch)     Status: None   Collection Time: 07/01/22  4:07 PM  Result Value Ref Range   Order Confirmation       ORDER PROCESSED BY BLOOD BANK Performed at [facility], [place] [address], [place], [place] [address]   Type and screen [place]     Status: None (Preliminary result)   Collection Time: 07/01/22  4:15 PM  Result Value Ref Range   ABO/RH(D) O POS    Antibody Screen NEG    Sample Expiration 07/04/2022,2359    Unit Number [id number]    Blood Component Type RED CELLS,LR    Unit division 00    Status of Unit ISSUED,FINAL    Transfusion Status OK TO TRANSFUSE    Crossmatch Result Compatible    Unit Number [id number]    Blood Component Type RED CELLS,LR    Unit division 00    Status of Unit ISSUED,FINAL    Transfusion Status OK TO TRANSFUSE    Crossmatch Result Compatible    Unit Number [id number]    Blood Component Type RED CELLS,LR    Unit division 00    Status of Unit ISSUED    Transfusion Status OK TO TRANSFUSE    Crossmatch Result      Compatible Performed at [facility], [place] [address] [place],  [address]   Renal function panel     Status: Abnormal   Collection Time: 07/02/22  3:10 AM  Result Value Ref Range   Sodium 132 (L) 135 - 145 mmol/L   Potassium 4.8 3.5 - 5.1 mmol/L   Chloride 104 98 - 111 mmol/L   CO2 13 (L) 22 - 32 mmol/L   Glucose, Bld 92 70 - 99 mg/dL    Comment: Glucose reference range applies only to samples taken after fasting for at least 8 hours.   BUN 112 (H) 6 - 20 mg/dL   Creatinine, Ser 18.36 (H) 0.61 - 1.24 mg/dL   Calcium 6.9 (L) 8.9 - 10.3 mg/dL   Phosphorus 8.6 (H) 2.5 - 4.6 mg/dL   Albumin 2.7 (L) 3.5 - 5.0 g/dL   GFR, Estimated 3 (L) >60 mL/min    Comment: (NOTE) Calculated using the CKD-EPI Creatinine Equation (2021)  Anion gap 15 5 - 15    Comment: Performed at [facility] [address] [place] [address]  CBC     Status: Abnormal   Collection Time: 07/02/22  3:10 AM  Result Value Ref Range   WBC 4.6 4.0 - 10.5 K/uL   RBC 2.11 (L) 4.22 - 5.81 MIL/uL   Hemoglobin 6.5  (LL) 13.0 - 17.0 g/dL    Comment: CRITICAL VALUE NOTED.  VALUE IS CONSISTENT WITH PREVIOUSLY REPORTED AND CALLED VALUE. REPEATED TO VERIFY    HCT 19.4 (L) 39.0 - 52.0 %   MCV 91.9 80.0 - 100.0 fL   MCH 30.8 26.0 - 34.0 pg   MCHC 33.5 30.0 - 36.0 g/dL   RDW 16.1 (H) 11.5 - 15.5 %   Platelets 155 150 - 400 K/uL   nRBC 0.0 0.0 - 0.2 %    Comment: Performed at [facility] [address] [place], [place] [address]  Prepare RBC (crossmatch)     Status: None   Collection Time: 07/02/22  8:30 AM  Result Value Ref Range   Order Confirmation      ORDER PROCESSED BY BLOOD BANK Performed at [facility], [place] [address], [place], [place] [address]   Hemoglobin and hematocrit, blood     Status: Abnormal   Collection Time: 07/02/22 12:21 PM  Result Value Ref Range   Hemoglobin 8.1 (L) 13.0 - 17.0 g/dL   HCT 25.0 (L) 39.0 - 52.0 %    Comment: Performed at [facility] [address] [place], [place] [address]    DG Lumbar Spine Complete  Result Date: 07/01/2022 CLINICAL DATA:  Lower back pain and bilateral hip pain. EXAM: LUMBAR SPINE - COMPLETE 4+ VIEW COMPARISON:  Lumbar spine radiographs 12/05/2021 FINDINGS: There are 5 non-rib-bearing lumbar-type vertebral bodies. Normal frontal alignment. No sagittal spondylolisthesis vertebral body heights are maintained. Mild L5-S1 and minimal posterior L4-5 disc space narrowing. There is again increased sclerosis within the superior and inferior endplates of the vertebral bodies ("rugger [place] spine "). No acute fracture is seen. IMPRESSION: Mild L5-S1 and minimal L4-5 degenerative disc changes. "[place] spine" consistent with sequela of chronic renal osteodystrophy. Electronically Signed   By: Yvonne Kendall M.D.   On: 07/01/2022 12:09   DG Hip Unilat W or Wo Pelvis 2-3 Views Right  Result Date: 07/01/2022 CLINICAL DATA:  Back pain and bilateral hip pain radiating down the legs for the past 5 months. EXAM: DG HIP (WITH OR  WITHOUT PELVIS) 2-3V RIGHT; DG HIP (WITH OR WITHOUT PELVIS) 2-3V LEFT COMPARISON:  Right hip radiographs 06/24/2022 and pelvis and left hip radiographs 01/19/2022; CT pelvis 06/24/2022 FINDINGS: A peritoneal dialysis catheter is again coiled overlying the mid pelvis. Moderate to high-grade atherosclerotic calcifications. No significant change in right-greater-than-left pubic body erosion and abnormal approximate 2 cm pubic symphysis diastasis compared to recent 06/24/2022 radiographs. This is again worsened from 02/18/2022 radiographs. Patchy lucencies overlying the bilateral sacroiliac joints, better seen on prior CT. A subacute fracture of the left iliac wing is again noted, with approximately 8 mm diastasis throughout the fracture extending from the inferior aspect of the left sacroiliac joint through the left superior iliac crest. 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 IMPRESSION: 1. No significant change in right-greater-than-left pubic body erosion and abnormal pubic symphysis diastasis compared to recent 06/24/2022 radiographs. 2. Subacute fracture of  the left iliac wing with approximately 8 mm diastasis throughout the fracture. This was seen on recent CT. 3. Additional bilateral sacroiliac joint erosions. 4. All the pelvic erosions may be secondary to chronic renal osteodystrophy and secondary hyperparathyroidism. The weakened bone likely contributed to the left iliac wing subacute suspected insufficiency fracture. Electronically Signed   By: Yvonne Kendall M.D.   On: 07/01/2022 12:05   DG Hip Unilat W or Wo Pelvis 2-3 Views Left  Result Date: 07/01/2022 CLINICAL DATA:  Back pain and bilateral hip pain radiating down the legs for the past 5 months. EXAM: DG HIP (WITH OR WITHOUT PELVIS) 2-3V RIGHT; DG HIP (WITH OR WITHOUT PELVIS) 2-3V LEFT COMPARISON:  Right hip radiographs 06/24/2022 and pelvis and left  hip radiographs 01/19/2022; CT pelvis 06/24/2022 FINDINGS: A peritoneal dialysis catheter is again coiled overlying the mid pelvis. Moderate to high-grade atherosclerotic calcifications. No significant change in right-greater-than-left pubic body erosion and abnormal approximate 2 cm pubic symphysis diastasis compared to recent 06/24/2022 radiographs. This is again worsened from 02/18/2022 radiographs. Patchy lucencies overlying the bilateral sacroiliac joints, better seen on prior CT. A subacute fracture of the left iliac wing is again noted, with approximately 8 mm diastasis throughout the fracture extending from the inferior aspect of the left sacroiliac joint through the left superior iliac crest. 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 IMPRESSION: 1. No significant change in right-greater-than-left pubic body erosion and abnormal pubic symphysis diastasis compared to recent 06/24/2022 radiographs. 2. Subacute fracture of the left iliac wing with approximately 8 mm diastasis throughout the fracture. This was seen on recent CT. 3. Additional bilateral sacroiliac joint erosions. 4. All the pelvic erosions may be secondary to chronic renal osteodystrophy and secondary hyperparathyroidism. The weakened bone likely contributed to the left iliac wing subacute suspected insufficiency fracture. Electronically Signed   By: Yvonne Kendall M.D.   On: 07/01/2022 12:05    ROS: hip pain, inability to ambulate-  blood in stool  Blood pressure (!) 128/90, pulse 78, temperature 97.7 F (36.5 C), temperature source Oral, resp. rate 12, SpO2 100 %. General appearance: alert, appears stated age, and no distress Resp: clear to auscultation bilaterally Cardio: regular rate and rhythm, S1, S2 normal, no murmur, click, rub or gallop GI: soft, non-tender; bowel sounds normal; no masses,  no organomegaly and PD cath in  place Extremities: extremities normal, atraumatic, no cyanosis or edema Curled up-  does not appear in pain at rest   Assessment/Plan: 56 year old BM-  longstanding ESRD-   possibly not compliant-  presents with hip pain and x-rays c/w renal osteodystrophy and also hgb in the 5's  1 hip pain-  x-rays c/w severe renal osteodystrophy-  unfortunately this has probably been in the making for a decade and there will be no quick fix.  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  Also will give DDAVP for platelet dysfunction in the setting of uremia.  I will check iron and give ESA as well  5. Metabolic Bone Disease: as above-  unlikely to find quick fix-  PT/pain control    Derrick Moore 07/02/2022, 1:26 PM

## 2022-07-02 NOTE — Progress Notes (Addendum)
Progress Note   Patient: Derrick Moore OZH:[id number] DOB: 10/09/1965 DOA: 07/01/2022     0 DOS: the patient was seen and examined on 07/02/2022   Brief hospital course: Taken from H&P.  Derrick Moore is a 56 y.o. male with medical history significant of ESRD on peritoneal dialysis, chronic bilateral hip pain, anemia of chronic disease, hypertension who presents to the emergency room with complaints of ongoing bilateral hip pain for the last several months, worse recently.  His pain got worse after he bent over to pick up an object last week.  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  ED Course: Hemodynamically stable.  Hemoglobin 5.2 with baseline hemoglobin about 7.  Potassium is normal.  Skeletal survey including both hip x-ray, recent CT scan of the pelvis from 11/28 shows iliac crest healing fracture, multiple osteodystrophy lesions, no hip fracture.   12/6: Vitals within normal limit, hemoglobin improved to 6.5 after getting 2 unit of PRBC.  Ordered 1 more unit.  Renal function with mild hyponatremia with sodium of 132, CO2 of 13, BUN 112, creatinine 18.36.  Started him on bicarb tablet. Patient with concern of noncompliant, apparently he was getting EPO at his dialysis center. History of intermittent bloody stools.  He was also to do stool cards multiple times but never did it.  GI was also consulted.  Checking iron studies. Holding Plavix as advised by GI for possible EGD and colonoscopy.    Assessment and Plan: * Symptomatic anemia Patient is also having intermittent bloody stool.  He was asked to bring stool card which he never did.  Most likely a combination with ESRD and GI bleed. Receives Epogen at his dialysis center. Hemoglobin at 6.5 after getting 2 unit of PRBC so 1/3 unit ordered. -GI consult -Monitor hemoglobin -Transfuse if below 7 -Iron studies  Hypertension Blood pressure within  goal. -Continue home lisinopril and amlodipine  ESRD on dialysis () Significantly elevated BUN and creatinine.  Patient is doing peritoneal dialysis for the past many years.  Not sure about the compliance. -Nephrology was consulted-appreciate their help  Dyslipidemia -Continue home pravastatin  Ambulatory dysfunction -PT/OT evaluation  Sacroiliac joint dysfunction of right side Causing significant ambulatory dysfunction. Imaging consistent with renal osteodystrophy, patient has parathyroid levels above 2021.  Nephrology is on board. -Continue with Sensipar -Pain management -PT/OT evaluation   Subjective: Patient was seen and examined today.  No pain while he is lying down in bed.  He was not sure when did he had his last PAD, most likely 2 to 3 days ago.  Having some intermittent bleeding with stools.  Physical Exam: Vitals:   07/02/22 1130 07/02/22 1200 07/02/22 1400 07/02/22 1430  BP: (!) 140/96 (!) 128/90 130/89 (!) 126/91  Pulse: 74 78 81 80  Resp: 12 12 11 11   Temp:  97.7 F (36.5 C)    TempSrc:  Oral    SpO2: 99% 100% 98% 97%   General.  Malnourished gentleman, in no acute distress. Pulmonary.  Lungs clear bilaterally, normal respiratory effort. CV.  Regular rate and rhythm, no JVD, rub or murmur. Abdomen.  Soft, nontender, nondistended, BS positive. CNS.  Alert and oriented .  No focal neurologic deficit. Extremities.  No edema, no cyanosis, pulses intact and symmetrical. Psychiatry.  Judgment and insight appears normal.   Data Reviewed: Prior data reviewed  Family Communication: Discussed with patient  Disposition: Status is: Observation The patient will  require care spanning > 2 midnights and should be moved to inpatient because: Severity of illness  Planned Discharge Destination: Home  DVT prophylaxis.  Subcu heparin Time spent: 50 minutes  This record has been created using Systems analyst. Errors have been sought and  corrected,but may not always be located. Such creation errors do not reflect on the standard of care.   Author: Lorella Nimrod, MD 07/02/2022 4:02 PM  For on call review [email or web address].

## 2022-07-02 NOTE — Hospital Course (Addendum)
Taken from H&P.  Derrick Moore is a 56 y.o. male with medical history significant of ESRD on peritoneal dialysis, chronic bilateral hip pain, anemia of chronic disease, hypertension who presents to the emergency room with complaints of ongoing bilateral hip pain for the last several months, worse recently.  His pain got worse after he bent over to pick up an object last week.  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  ED Course: Hemodynamically stable.  Hemoglobin 5.2 with baseline hemoglobin about 7.  Potassium is normal.  Skeletal survey including both hip x-ray, recent CT scan of the pelvis from 11/28 shows iliac crest healing fracture, multiple osteodystrophy lesions, no hip fracture.   12/6: Vitals within normal limit, hemoglobin improved to 6.5 after getting 2 unit of PRBC.  Ordered 1 more unit.  Renal function with mild hyponatremia with sodium of 132, CO2 of 13, BUN 112, creatinine 18.36.  Started him on bicarb tablet. Patient with concern of noncompliant, apparently he was getting EPO at his dialysis center. History of intermittent bloody stools.  He was also to do stool cards multiple times but never did it.  GI was also consulted.  Checking iron studies. Holding Plavix as advised by GI for possible EGD and colonoscopy.

## 2022-07-02 NOTE — Assessment & Plan Note (Signed)
-  PT/OT evaluation 

## 2022-07-02 NOTE — ED Notes (Signed)
Pt received 378ml of red cells.  No new orders at this time.

## 2022-07-02 NOTE — Assessment & Plan Note (Signed)
-   Continue home pravastatin  

## 2022-07-02 NOTE — Assessment & Plan Note (Signed)
Causing significant ambulatory dysfunction. Imaging consistent with renal osteodystrophy, patient has parathyroid levels above 2021.  Nephrology is on board. -Continue with Sensipar -Pain management -PT/OT evaluation

## 2022-07-02 NOTE — Assessment & Plan Note (Signed)
Patient is also having intermittent bloody stool.  He was asked to bring stool card which he never did.  Most likely a combination with ESRD and GI bleed. Receives Epogen at his dialysis center. Hemoglobin at 6.5 after getting 2 unit of PRBC so 1/3 unit ordered. -GI consult -Monitor hemoglobin -Transfuse if below 7 -Iron studies

## 2022-07-02 NOTE — ED Provider Notes (Signed)
[facility]ERGENCY DEPARTMENT Provider Note  CSN: [id number] Arrival date & time: 07/01/22 3382  Chief Complaint(s) Leg Pain  HPI Derrick Moore is a 56 y.o. male with PMH ESRD on peritoneal dialysis, HTN, HLD, CVA who presents emergency department for evaluation of leg and back pain.  Patient was reportedly seen on 06/24/2022 at [facility] for similar complaints and was found to be critically anemic requiring blood transfusion, hyperkalemic requiring peritoneal dialysis and found to have a subacute left iliac wing fracture, left ischial tuberosity avulsion and right anterior inferior iliac spine avulsion that had worsened as well as erosive changes in the pubic symphysis and bilateral sacroiliac joints.  They attempted to to transfer him to [facility] for admission and transfusion at that time but the patient had to leave the emergency department to stop a water leak in his home and left AMA.  He returns to [facility] with the same complaints as before.  He does endorse fatigue and some mild shortness of breath but primarily endorses low back pain and hip pain.  States he is having difficulty walking.  Denies chest pain, headache, fever or other systemic symptoms.   Past Medical History Past Medical History:  Diagnosis Date   Dialysis patient [facility])    Hyperlipidemia    Hypertension    Renal failure    Renal failure    Stroke [facility])    Patient Active Problem List   Diagnosis Date Noted   Ambulatory dysfunction 07/01/2022   Lumbar radiculopathy 03/13/2022   Symptomatic anemia 02/22/2022   Hypertension    ESRD on dialysis [facility])    Dyslipidemia    Depression    Anemia 02/20/2022   Plantar fasciitis of left foot 01/02/2022   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   Sacroiliac joint dysfunction of right side 12/05/2021   Hip flexor tendinitis, right 02/21/2021   Home Medication(s) Prior to Admission medications   Medication Sig Start Date End Date  Taking? Authorizing Provider  amLODipine (NORVASC) 5 MG tablet Take 5 mg by mouth at bedtime.   Yes [provider]  cinacalcet (SENSIPAR) 60 MG tablet Take 60 mg by mouth daily.   Yes [provider]  clopidogrel (PLAVIX) 75 MG tablet Take 1 tablet (75 mg total) by mouth daily. 03/05/22  Yes Saguier, Percell Miller, PA-C  HYDROcodone-acetaminophen (NORCO/VICODIN) 5-325 MG tablet Take 1 tablet by mouth every 8 (eight) hours as needed. 03/13/22  Yes Rosemarie Ax, MD  ibuprofen (ADVIL) 200 MG tablet Take 200 mg by mouth every 6 (six) hours as needed for headache or mild pain.   Yes [provider]  labetalol (NORMODYNE) 200 MG tablet Take 200 mg by mouth 2 (two) times daily.   Yes [provider]  lisinopril (ZESTRIL) 30 MG tablet Take 30 mg by mouth at bedtime. 07/04/20  Yes [provider]  naproxen (NAPROSYN) 500 MG tablet Take 1 tablet by mouth twice daily 06/13/22  Yes Saguier, Percell Miller, PA-C  pravastatin (PRAVACHOL) 20 MG tablet Take by mouth. 05/19/19  Yes [provider]  PRESCRIPTION MEDICATION Tylenol 3   Yes [provider]  lidocaine (LIDODERM) 5 % Place 1 patch onto the skin daily. Remove & Discard patch within 12 hours or as directed by MD Patient not taking: Reported on 07/01/2022 02/12/22   Curatolo, Adam, DO  ondansetron (ZOFRAN) 4 MG tablet Take 1 tablet (4 mg total) by mouth every 6 (six) hours. Patient not taking: Reported on 07/01/2022 03/16/22  Curatolo, Adam, DO  sodium polystyrene (KAYEXALATE) 15 GM/60ML suspension Take 60 ml by mouth daily for 2 days. Patient not taking: Reported on 07/01/2022 03/10/22   Saguier, Iris Pert                                                                                                                                    Past Surgical History History reviewed. No pertinent surgical history. Family History History reviewed. No pertinent family history.  Social History Social History    Tobacco Use   Smoking status: Every Day    Packs/day: 0.25    Years: 33.00    Total pack years: 8.25    Types: Cigarettes   Smokeless tobacco: Never  Vaping Use   Vaping Use: Never used  Substance Use Topics   Alcohol use: Not Currently    Alcohol/week: 1.0 standard drink of alcohol    Types: 1 Cans of beer per week   Drug use: Yes    Types: Marijuana   Allergies Patient has no known allergies.  Review of Systems Review of Systems  Respiratory:  Positive for shortness of breath.   Musculoskeletal:  Positive for arthralgias and myalgias.    Physical Exam Vital Signs  I have reviewed the triage vital signs BP (!) 126/92   Pulse 77   Temp 97.7 F (36.5 C) (Oral)   Resp 10   SpO2 97%   Physical Exam Constitutional:      General: He is not in acute distress.    Appearance: Normal appearance.  HENT:     Head: Normocephalic and atraumatic.     Nose: No congestion or rhinorrhea.  Eyes:     General:        Right eye: No discharge.        Left eye: No discharge.     Extraocular Movements: Extraocular movements intact.     Pupils: Pupils are equal, round, and reactive to light.  Cardiovascular:     Rate and Rhythm: Normal rate and regular rhythm.     Heart sounds: No murmur heard. Pulmonary:     Effort: No respiratory distress.     Breath sounds: No wheezing or rales.  Abdominal:     General: There is no distension.     Tenderness: There is no abdominal tenderness.  Musculoskeletal:        General: Tenderness present. Normal range of motion.     Cervical back: Normal range of motion.  Skin:    General: Skin is warm and dry.  Neurological:     General: No focal deficit present.     Mental Status: He is alert.     ED Results and Treatments Labs (all labs ordered are listed, but only abnormal results are displayed) Labs Reviewed  CBC WITH DIFFERENTIAL/PLATELET - Abnormal; Notable for the following components:      Result Value   RBC 1.71 (*)  Hemoglobin 5.2 (*)    HCT 16.4 (*)    RDW 16.9 (*)    All other components within normal limits  BASIC METABOLIC PANEL - Abnormal; Notable for the following components:   CO2 12 (*)    BUN 111 (*)    Creatinine, Ser 19.02 (*)    Calcium 7.4 (*)    GFR, Estimated 3 (*)    Anion gap 16 (*)    All other components within normal limits  RENAL FUNCTION PANEL - Abnormal; Notable for the following components:   Sodium 132 (*)    CO2 13 (*)    BUN 112 (*)    Creatinine, Ser 18.36 (*)    Calcium 6.9 (*)    Phosphorus 8.6 (*)    Albumin 2.7 (*)    GFR, Estimated 3 (*)    All other components within normal limits  CBC - Abnormal; Notable for the following components:   RBC 2.11 (*)    Hemoglobin 6.5 (*)    HCT 19.4 (*)    RDW 16.1 (*)    All other components within normal limits  PTH, INTACT AND CALCIUM  PHOSPHORUS  I-STAT CHEM 8, ED  PREPARE RBC (CROSSMATCH)  TYPE AND SCREEN  PREPARE RBC (CROSSMATCH)                                                                                                                          Radiology DG Lumbar Spine Complete  Result Date: 07/01/2022 CLINICAL DATA:  Lower back pain and bilateral hip pain. EXAM: LUMBAR SPINE - COMPLETE 4+ VIEW COMPARISON:  Lumbar spine radiographs 12/05/2021 FINDINGS: There are 5 non-rib-bearing lumbar-type vertebral bodies. Normal frontal alignment. No sagittal spondylolisthesis vertebral body heights are maintained. Mild L5-S1 and minimal posterior L4-5 disc space narrowing. There is again increased sclerosis within the superior and inferior endplates of the vertebral bodies ("rugger [place] spine "). No acute fracture is seen. IMPRESSION: Mild L5-S1 and minimal L4-5 degenerative disc changes. "[place] spine" consistent with sequela of chronic renal osteodystrophy. Electronically Signed   By: Yvonne Kendall M.D.   On: 07/01/2022 12:09   DG Hip Unilat W or Wo Pelvis 2-3 Views Right  Result Date: 07/01/2022 CLINICAL  DATA:  Back pain and bilateral hip pain radiating down the legs for the past 5 months. EXAM: DG HIP (WITH OR WITHOUT PELVIS) 2-3V RIGHT; DG HIP (WITH OR WITHOUT PELVIS) 2-3V LEFT COMPARISON:  Right hip radiographs 06/24/2022 and pelvis and left hip radiographs 01/19/2022; CT pelvis 06/24/2022 FINDINGS: A peritoneal dialysis catheter is again coiled overlying the mid pelvis. Moderate to high-grade atherosclerotic calcifications. No significant change in right-greater-than-left pubic body erosion and abnormal approximate 2 cm pubic symphysis diastasis compared to recent 06/24/2022 radiographs. This is again worsened from 02/18/2022 radiographs. Patchy lucencies overlying the bilateral sacroiliac joints, better seen on prior CT. A subacute fracture of the left iliac wing is again noted, with approximately 8 mm diastasis throughout the fracture extending from  the inferior aspect of the left sacroiliac joint through the left superior iliac crest. 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 IMPRESSION: 1. No significant change in right-greater-than-left pubic body erosion and abnormal pubic symphysis diastasis compared to recent 06/24/2022 radiographs. 2. Subacute fracture of the left iliac wing with approximately 8 mm diastasis throughout the fracture. This was seen on recent CT. 3. Additional bilateral sacroiliac joint erosions. 4. All the pelvic erosions may be secondary to chronic renal osteodystrophy and secondary hyperparathyroidism. The weakened bone likely contributed to the left iliac wing subacute suspected insufficiency fracture. Electronically Signed   By: Yvonne Kendall M.D.   On: 07/01/2022 12:05   DG Hip Unilat W or Wo Pelvis 2-3 Views Left  Result Date: 07/01/2022 CLINICAL DATA:  Back pain and bilateral hip pain radiating down the legs for the past 5 months. EXAM: DG HIP (WITH OR WITHOUT PELVIS) 2-3V  RIGHT; DG HIP (WITH OR WITHOUT PELVIS) 2-3V LEFT COMPARISON:  Right hip radiographs 06/24/2022 and pelvis and left hip radiographs 01/19/2022; CT pelvis 06/24/2022 FINDINGS: A peritoneal dialysis catheter is again coiled overlying the mid pelvis. Moderate to high-grade atherosclerotic calcifications. No significant change in right-greater-than-left pubic body erosion and abnormal approximate 2 cm pubic symphysis diastasis compared to recent 06/24/2022 radiographs. This is again worsened from 02/18/2022 radiographs. Patchy lucencies overlying the bilateral sacroiliac joints, better seen on prior CT. A subacute fracture of the left iliac wing is again noted, with approximately 8 mm diastasis throughout the fracture extending from the inferior aspect of the left sacroiliac joint through the left superior iliac crest. 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 IMPRESSION: 1. No significant change in right-greater-than-left pubic body erosion and abnormal pubic symphysis diastasis compared to recent 06/24/2022 radiographs. 2. Subacute fracture of the left iliac wing with approximately 8 mm diastasis throughout the fracture. This was seen on recent CT. 3. Additional bilateral sacroiliac joint erosions. 4. All the pelvic erosions may be secondary to chronic renal osteodystrophy and secondary hyperparathyroidism. The weakened bone likely contributed to the left iliac wing subacute suspected insufficiency fracture. Electronically Signed   By: Yvonne Kendall M.D.   On: 07/01/2022 12:05    Pertinent labs & imaging results that were available during my care of the patient were reviewed by me and considered in my medical decision making (see MDM for details).  Medications Ordered in ED Medications  oxyCODONE-acetaminophen (PERCOCET/ROXICET) 5-325 MG per tablet 1 tablet (1 tablet Oral Given 07/01/22 1656)  lisinopril (ZESTRIL)  tablet 30 mg (30 mg Oral Given 07/01/22 2208)  labetalol (NORMODYNE) tablet 200 mg (200 mg Oral Given 07/02/22 0927)  amLODipine (NORVASC) tablet 5 mg (5 mg Oral Given 07/02/22 0927)  cinacalcet (SENSIPAR) tablet 60 mg (60 mg Oral Given 07/02/22 0815)  clopidogrel (PLAVIX) tablet 75 mg (75 mg Oral Given 07/02/22 0927)  methocarbamol (ROBAXIN) tablet 500 mg (has no administration in time range)  heparin injection 5,000 Units (5,000 Units Subcutaneous Given 07/02/22 0537)  acetaminophen (TYLENOL) tablet 650 mg (has no administration in time range)    Or  acetaminophen (TYLENOL) suppository 650 mg (has no administration in time range)  oxyCODONE (Oxy IR/ROXICODONE) immediate release tablet 5 mg (5 mg Oral Given 07/01/22 2208)  ondansetron (ZOFRAN) tablet 4 mg (has no administration in time range)    Or  ondansetron (ZOFRAN) injection 4 mg (  has no administration in time range)  sodium bicarbonate tablet 650 mg (650 mg Oral Given 07/02/22 0927)  0.9 %  sodium chloride infusion (Manually program via Guardrails IV Fluids) (0 mLs Intravenous Stopped 07/02/22 0126)  0.9 %  sodium chloride infusion (Manually program via Guardrails IV Fluids) ( Intravenous New Bag/Given 07/02/22 0923)                                                                                                                                     Procedures .Critical Care  Performed by: Teressa Lower, MD Authorized by: Teressa Lower, MD   Critical care provider statement:    Critical care time (minutes):  30   Critical care was necessary to treat or prevent imminent or life-threatening deterioration of the following conditions:  Circulatory failure (critical anemia requiring transfusion)   Critical care was time spent personally by me on the following activities:  Development of treatment plan with patient or surrogate, discussions with consultants, evaluation of patient's response to treatment, examination of patient, ordering and review  of laboratory studies, ordering and review of radiographic studies, ordering and performing treatments and interventions, pulse oximetry, re-evaluation of patient's condition and review of old charts   (including critical care time)  Medical Decision Making / ED Course   This patient presents to the ED for concern of leg pain, fatigue, this involves an extensive number of treatment options, and is a complaint that carries with it a high risk of complications and morbidity.  The differential diagnosis includes anemia, pathologic fracture, arthritis, electrolyte abnormality  MDM: Patient seen in the emerged department for evaluation of multiple complaints as described above.  Physical exam with generalized tenderness in the low back and around the pelvis but is otherwise unremarkable.  Laboratory evaluation showing critical anemia of 5.2 and 2 units packed red blood cells ordered.  Potassium is at the upper limit of normal at 5.0 with a BUN of 111, creatinine 19.02.  I spoke with the nephrologist on-call who will help arrange peritoneal dialysis while inpatient.  X-ray imaging today showing no significant change in the pubic body erosion and abnormal pubic symphysis diastasis as well as other degenerative changes consistent with osteodystrophy and secondary hyperparathyroidism.  Patient will require hospital admission for physical therapy and correction of anemia.  Patient been admitted.   Additional history obtained:  -External records from outside source obtained and reviewed including: Chart review including previous notes, labs, imaging, consultation notes   Lab Tests: -I ordered, reviewed, and interpreted labs.   The pertinent results include:   Labs Reviewed  CBC WITH DIFFERENTIAL/PLATELET - Abnormal; Notable for the following components:      Result Value   RBC 1.71 (*)    Hemoglobin 5.2 (*)    HCT 16.4 (*)    RDW 16.9 (*)    All other components within normal limits  BASIC  METABOLIC PANEL - Abnormal; Notable  for the following components:   CO2 12 (*)    BUN 111 (*)    Creatinine, Ser 19.02 (*)    Calcium 7.4 (*)    GFR, Estimated 3 (*)    Anion gap 16 (*)    All other components within normal limits  RENAL FUNCTION PANEL - Abnormal; Notable for the following components:   Sodium 132 (*)    CO2 13 (*)    BUN 112 (*)    Creatinine, Ser 18.36 (*)    Calcium 6.9 (*)    Phosphorus 8.6 (*)    Albumin 2.7 (*)    GFR, Estimated 3 (*)    All other components within normal limits  CBC - Abnormal; Notable for the following components:   RBC 2.11 (*)    Hemoglobin 6.5 (*)    HCT 19.4 (*)    RDW 16.1 (*)    All other components within normal limits  PTH, INTACT AND CALCIUM  PHOSPHORUS  I-STAT CHEM 8, ED  PREPARE RBC (CROSSMATCH)  TYPE AND SCREEN  PREPARE RBC (CROSSMATCH)      EKG   EKG Interpretation  Date/Time:  Tuesday July 01 2022 15:07:10 EST Ventricular Rate:  80 PR Interval:  170 QRS Duration: 92 QT Interval:  426 QTC Calculation: 492 R Axis:   44 Text Interpretation: Sinus rhythm Left ventricular hypertrophy Borderline prolonged QT interval When compared with ECG of 06/24/2022, No significant change was found Confirmed by Delora Fuel ([id number]) on 07/01/2022 11:49:58 PM         Imaging Studies ordered: I ordered imaging studies including XR Hip, L spine I independently visualized and interpreted imaging. I agree with the radiologist interpretation   Medicines ordered and prescription drug management: Meds ordered this encounter  Medications   0.9 %  sodium chloride infusion (Manually program via Guardrails IV Fluids)   oxyCODONE-acetaminophen (PERCOCET/ROXICET) 5-325 MG per tablet 1 tablet   lisinopril (ZESTRIL) tablet 30 mg   labetalol (NORMODYNE) tablet 200 mg   amLODipine (NORVASC) tablet 5 mg   DISCONTD: lidocaine (LIDODERM) 5 % 1 patch    Remove & Discard patch within 12 hours or as directed by MD     cinacalcet  (SENSIPAR) tablet 60 mg   clopidogrel (PLAVIX) tablet 75 mg   methocarbamol (ROBAXIN) tablet 500 mg   heparin injection 5,000 Units   OR Linked Order Group    acetaminophen (TYLENOL) tablet 650 mg    acetaminophen (TYLENOL) suppository 650 mg   oxyCODONE (Oxy IR/ROXICODONE) immediate release tablet 5 mg   OR Linked Order Group    ondansetron (ZOFRAN) tablet 4 mg    ondansetron (ZOFRAN) injection 4 mg   0.9 %  sodium chloride infusion (Manually program via Guardrails IV Fluids)   sodium bicarbonate tablet 650 mg    -I have reviewed the patients home medicines and have made adjustments as needed  Critical interventions Packed red blood cell administration  Consultations Obtained: I requested consultation with the nephrologist on-call,  and discussed lab and imaging findings as well as pertinent plan - they recommend: Inpatient peritoneal dialysis   Cardiac Monitoring: The patient was maintained on a cardiac monitor.  I personally viewed and interpreted the cardiac monitored which showed an underlying rhythm of: NSR  Social Determinants of Health:  Factors impacting patients care include: Had to leave AMA from previous ER presentation   Reevaluation: After the interventions noted above, I reevaluated the patient and found that they have :improved  Co morbidities that complicate  the patient evaluation  Past Medical History:  Diagnosis Date   Dialysis patient [facility])    Hyperlipidemia    Hypertension    Renal failure    Renal failure    Stroke [facility])       Dispostion: I considered admission for this patient, and due to critical anemia requiring blood transfusion patient require hospital mission     Final Clinical Impression(s) / ED Diagnoses Final diagnoses:  None     @PCDICTATION @    Teressa Lower, MD 07/02/22 1157

## 2022-07-02 NOTE — ED Notes (Signed)
Spoke with Mateo Flow, RN in Hemodialysis and orders for overnight peritoneal dialysis will done either in ED or inpatient room later this evening.  No new orders at this time.

## 2022-07-02 NOTE — Progress Notes (Signed)
PT Cancellation Note  Patient Details Name: Derrick Moore MRN: [id number] DOB: 1965/11/21   Cancelled Treatment:    Reason Eval/Treat Not Completed: Medical issues which prohibited therapy.  Continues to have very low hgb readings and will follow up at another time.   Ramond Dial 07/02/2022, 12:22 PM  Mee Hives, PT PhD Acute Rehab Dept. Number: [place]

## 2022-07-02 NOTE — Procedures (Addendum)
PD Note:  Patient in ER room, alert and oriented.  Time out completed with patient.   Patient placed on the cycler without issue.  See flowsheet for details of order and set up.  Patient does not yet have gentamicin cream, dressing will be changed when It is available.  This will be passed on to the next shift.

## 2022-07-03 DIAGNOSIS — D62 Acute posthemorrhagic anemia: Secondary | ICD-10-CM | POA: Insufficient documentation

## 2022-07-03 DIAGNOSIS — Z8673 Personal history of transient ischemic attack (TIA), and cerebral infarction without residual deficits: Secondary | ICD-10-CM | POA: Diagnosis not present

## 2022-07-03 DIAGNOSIS — Z79899 Other long term (current) drug therapy: Secondary | ICD-10-CM | POA: Diagnosis not present

## 2022-07-03 DIAGNOSIS — D691 Qualitative platelet defects: Secondary | ICD-10-CM | POA: Diagnosis not present

## 2022-07-03 DIAGNOSIS — N186 End stage renal disease: Secondary | ICD-10-CM | POA: Diagnosis not present

## 2022-07-03 DIAGNOSIS — E785 Hyperlipidemia, unspecified: Secondary | ICD-10-CM | POA: Diagnosis not present

## 2022-07-03 DIAGNOSIS — R262 Difficulty in walking, not elsewhere classified: Secondary | ICD-10-CM | POA: Diagnosis not present

## 2022-07-03 DIAGNOSIS — Z7902 Long term (current) use of antithrombotics/antiplatelets: Secondary | ICD-10-CM | POA: Diagnosis not present

## 2022-07-03 DIAGNOSIS — N2581 Secondary hyperparathyroidism of renal origin: Secondary | ICD-10-CM | POA: Diagnosis not present

## 2022-07-03 DIAGNOSIS — K921 Melena: Secondary | ICD-10-CM | POA: Diagnosis not present

## 2022-07-03 DIAGNOSIS — D631 Anemia in chronic kidney disease: Secondary | ICD-10-CM | POA: Diagnosis not present

## 2022-07-03 DIAGNOSIS — D649 Anemia, unspecified: Secondary | ICD-10-CM | POA: Diagnosis not present

## 2022-07-03 DIAGNOSIS — N250 Renal osteodystrophy: Secondary | ICD-10-CM | POA: Diagnosis not present

## 2022-07-03 DIAGNOSIS — Z5329 Procedure and treatment not carried out because of patient's decision for other reasons: Secondary | ICD-10-CM | POA: Diagnosis not present

## 2022-07-03 DIAGNOSIS — M545 Low back pain, unspecified: Secondary | ICD-10-CM | POA: Diagnosis not present

## 2022-07-03 DIAGNOSIS — G8929 Other chronic pain: Secondary | ICD-10-CM | POA: Diagnosis not present

## 2022-07-03 DIAGNOSIS — M25851 Other specified joint disorders, right hip: Secondary | ICD-10-CM | POA: Diagnosis not present

## 2022-07-03 DIAGNOSIS — M898X9 Other specified disorders of bone, unspecified site: Secondary | ICD-10-CM | POA: Diagnosis not present

## 2022-07-03 DIAGNOSIS — I120 Hypertensive chronic kidney disease with stage 5 chronic kidney disease or end stage renal disease: Secondary | ICD-10-CM | POA: Diagnosis not present

## 2022-07-03 DIAGNOSIS — R944 Abnormal results of kidney function studies: Secondary | ICD-10-CM | POA: Diagnosis not present

## 2022-07-03 DIAGNOSIS — M84454D Pathological fracture, pelvis, subsequent encounter for fracture with routine healing: Secondary | ICD-10-CM | POA: Diagnosis not present

## 2022-07-03 DIAGNOSIS — F17210 Nicotine dependence, cigarettes, uncomplicated: Secondary | ICD-10-CM | POA: Diagnosis not present

## 2022-07-03 DIAGNOSIS — M25559 Pain in unspecified hip: Secondary | ICD-10-CM | POA: Diagnosis not present

## 2022-07-03 DIAGNOSIS — Z992 Dependence on renal dialysis: Secondary | ICD-10-CM | POA: Diagnosis not present

## 2022-07-03 DIAGNOSIS — E871 Hypo-osmolality and hyponatremia: Secondary | ICD-10-CM | POA: Diagnosis not present

## 2022-07-03 LAB — BPAM RBC
Blood Product Expiration Date: 202401012359
Blood Product Expiration Date: 202401012359
Blood Product Expiration Date: 202401012359
ISSUE DATE / TIME: 202312051748
ISSUE DATE / TIME: 202312052225
ISSUE DATE / TIME: 202312060851
Unit Type and Rh: 5100
Unit Type and Rh: 5100
Unit Type and Rh: 5100

## 2022-07-03 LAB — CBC
HCT: 25.1 % — ABNORMAL LOW (ref 39.0–52.0)
Hemoglobin: 8.4 g/dL — ABNORMAL LOW (ref 13.0–17.0)
MCH: 30.3 pg (ref 26.0–34.0)
MCHC: 33.5 g/dL (ref 30.0–36.0)
MCV: 90.6 fL (ref 80.0–100.0)
Platelets: 166 10*3/uL (ref 150–400)
RBC: 2.77 MIL/uL — ABNORMAL LOW (ref 4.22–5.81)
RDW: 15.6 % — ABNORMAL HIGH (ref 11.5–15.5)
WBC: 5.5 10*3/uL (ref 4.0–10.5)
nRBC: 0 % (ref 0.0–0.2)

## 2022-07-03 LAB — IRON AND TIBC
Iron: 154 ug/dL (ref 45–182)
Saturation Ratios: 88 % — ABNORMAL HIGH (ref 17.9–39.5)
TIBC: 175 ug/dL — ABNORMAL LOW (ref 250–450)
UIBC: 21 ug/dL

## 2022-07-03 LAB — TYPE AND SCREEN
ABO/RH(D): O POS
Antibody Screen: NEGATIVE
Unit division: 0
Unit division: 0
Unit division: 0

## 2022-07-03 LAB — FERRITIN: Ferritin: 1019 ng/mL — ABNORMAL HIGH (ref 24–336)

## 2022-07-03 MED ORDER — SEVELAMER CARBONATE 800 MG PO TABS
1600.0000 mg | ORAL_TABLET | Freq: Three times a day (TID) | ORAL | Status: DC
  Administered 2022-07-03 – 2022-07-04 (×3): 1600 mg via ORAL
  Filled 2022-07-03 (×4): qty 2

## 2022-07-03 MED ORDER — DARBEPOETIN ALFA 300 MCG/0.6ML IJ SOSY
300.0000 ug | PREFILLED_SYRINGE | Freq: Once | INTRAMUSCULAR | Status: AC
  Administered 2022-07-03: 300 ug via SUBCUTANEOUS
  Filled 2022-07-03: qty 0.6

## 2022-07-03 MED ORDER — SODIUM CHLORIDE 0.9 % IV SOLN
20.0000 ug | Freq: Once | INTRAVENOUS | Status: AC
  Administered 2022-07-03: 20 ug via INTRAVENOUS
  Filled 2022-07-03: qty 5

## 2022-07-03 NOTE — Plan of Care (Signed)

## 2022-07-03 NOTE — Clinical Note (Incomplete)
PD tx initation note:   Pre TX VS:   Pre TX weight:   PD treatment initiated via aseptic technique. Consent signed and in chart. Patient is alert and oriented. No complaints of pain. No specimen collected. PD exit site clean, dry and intact. Gentamycin and new dressing applied. Bedside RN educated on PD machine and how to contact tech support when PD machine alarms.  

## 2022-07-03 NOTE — Progress Notes (Signed)
PD post treatment note:   PD treatment completed. Patient tolerated treatment well. PD effluent is clear. No specimen collected.  PD exit site clean, dry and intact. Patient is awake, oriented and in no acute distress.  Report given to bedside nurse.     Total UF removed:  132ml

## 2022-07-03 NOTE — Progress Notes (Addendum)
PROGRESS NOTE    Derrick Moore  VZD:[id number] DOB: [birth year] DOA: 07/01/2022 PCP: [name], PA-C   Brief Narrative:  Derrick Moore is a 56 y.o. male with medical history significant of ESRD on peritoneal dialysis, chronic bilateral hip pain, anemia of chronic disease, hypertension who presents to the emergency room with complaints of ongoing bilateral hip pain for the last several months, worse recently.  His pain got worse after he bent over to pick up an object last week.  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   ED Course: Hemodynamically stable.  Hemoglobin 5.2 with baseline hemoglobin about 7.  Potassium is normal.  Skeletal survey including both hip x-ray, recent CT scan of the pelvis from 11/28 shows iliac crest healing fracture, multiple osteodystrophy lesions, no hip fracture.   Assessment & Plan:   Principal Problem:   Symptomatic anemia Active Problems:   Hypertension   ESRD on dialysis ([place])   Dyslipidemia   Sacroiliac joint dysfunction of right side   Ambulatory dysfunction   Acute blood loss anemia (ABLA)  Symptomatic anemia/acute blood loss anemia//intermittent hematochezia: Patient is also having intermittent bloody stool.  He was asked to bring stool card which he never did.  Most likely a combination with ESRD and GI bleed.  Presented with hemoglobin of 5.2, received total 3 units PRBC, hemoglobin 8.4 today.  FOBT pending.  GI on board.  Scheduled for EGD and colonoscopy on Monday, once Plavix washout is completed Receives Epogen at his dialysis center.  Essential hypertension: Blood pressure within goal. -Continue home lisinopril and amlodipine   ESRD on dialysis ([place]) Significantly elevated BUN and creatinine.  Patient is doing peritoneal dialysis for the past many years.  Not sure about the compliance.  Nephrology on board.   Dyslipidemia -Continue home  pravastatin   Ambulatory dysfunction -PT/OT evaluation   Sacroiliac joint dysfunction of right side Causing significant ambulatory dysfunction. Imaging consistent with renal osteodystrophy, patient has parathyroid levels above 2021.  Nephrology is on board. -Continue with Sensipar -Pain management -PT/OT evaluation  DVT prophylaxis: heparin injection 5,000 Units Start: 07/01/22 2200   Code Status: Full Code  Family Communication:  None present at bedside.  Plan of care discussed with patient in length and he/she verbalized understanding and agreed with it.  Status is: Observation The patient will require care spanning > 2 midnights and should be moved to inpatient because: He is scheduled for colonoscopy and EGD on Monday.  Needs further monitoring of anemia and requirement of further blood transfusion.   Estimated body mass index is 22.24 kg/m as calculated from the following:   Height as of this encounter: 5\' 10"  (1.778 m).   Weight as of this encounter: 70.3 kg.    Nutritional Assessment: Body mass index is 22.24 kg/m.[name] Seen by dietician.  I agree with the assessment and plan as outlined below: Nutrition Status:        . Skin Assessment: I have examined the patient's skin and I agree with the wound assessment as performed by the wound care RN as outlined below:    Consultants:  GI and nephrology  Procedures:  As above  Antimicrobials:  Anti-infectives (From admission, onward)    None         Subjective: Patient seen and examined.  He feels better.  He has no complaints.  Objective: Vitals:   07/03/22 0500 07/03/22 0531 07/03/22 0825 07/03/22 1125  BP:  126/65  (!) 131/96 119/81  Pulse: 89  80 88  Resp: 16  16 16   Temp: 99.1 F (37.3 C)  98.5 F (36.9 C) 98.6 F (37 C)  TempSrc:   Oral Oral  SpO2: 98%  100% 100%  Weight: 66.6 kg 70.3 kg    Height:        Intake/Output Summary (Last 24 hours) at 07/03/2022 1148 Last data filed at 07/03/2022  0930 Gross per 24 hour  Intake 120 ml  Output 975 ml  Net -855 ml   Filed Weights   07/02/22 2036 07/03/22 0500 07/03/22 0531  Weight: 70.3 kg 66.6 kg 70.3 kg    Examination:  General exam: Appears calm and comfortable  Respiratory system: Clear to auscultation. Respiratory effort normal. Cardiovascular system: S1 & S2 heard, RRR. No JVD, murmurs, rubs, gallops or clicks. No pedal edema. Gastrointestinal system: Abdomen is nondistended, soft and nontender. No organomegaly or masses felt. Normal bowel sounds heard. Central nervous system: Alert and oriented. No focal neurological deficits. Extremities: Symmetric 5 x 5 power. Skin: No rashes, lesions or ulcers Psychiatry: Judgement and insight appear normal. Mood & affect appropriate.    Data Reviewed: I have personally reviewed following labs and imaging studies  CBC: Recent Labs  Lab 07/01/22 1511 07/02/22 0310 07/02/22 1221 07/03/22 0842  WBC 5.7 4.6  --  5.5  NEUTROABS 3.9  --   --   --   HGB 5.2* 6.5* 8.1* 8.4*  HCT 16.4* 19.4* 25.0* 25.1*  MCV 95.9 91.9  --  90.6  PLT 174 155  --  485   Basic Metabolic Panel: Recent Labs  Lab 07/01/22 1511 07/02/22 0310  NA 136 132*  K 5.0 4.8  CL 108 104  CO2 12* 13*  GLUCOSE 98 92  BUN 111* 112*  CREATININE 19.02* 18.36*  CALCIUM 7.4* 6.9*  PHOS  --  8.6*   GFR: Estimated Creatinine Clearance: 4.6 mL/min (A) (by C-G formula based on SCr of 18.36 mg/dL (H)). Liver Function Tests: Recent Labs  Lab 07/02/22 0310  ALBUMIN 2.7*   No results for input(s): "LIPASE", "AMYLASE" in the last 168 hours. No results for input(s): "AMMONIA" in the last 168 hours. Coagulation Profile: No results for input(s): "INR", "PROTIME" in the last 168 hours. Cardiac Enzymes: No results for input(s): "CKTOTAL", "CKMB", "CKMBINDEX", "TROPONINI" in the last 168 hours. BNP (last 3 results) No results for input(s): "PROBNP" in the last 8760 hours. HbA1C: No results for input(s): "HGBA1C"  in the last 72 hours. CBG: No results for input(s): "GLUCAP" in the last 168 hours. Lipid Profile: No results for input(s): "CHOL", "HDL", "LDLCALC", "TRIG", "CHOLHDL", "LDLDIRECT" in the last 72 hours. Thyroid Function Tests: No results for input(s): "TSH", "T4TOTAL", "FREET4", "T3FREE", "THYROIDAB" in the last 72 hours. Anemia Panel: Recent Labs    07/03/22 0317  FERRITIN 1,019*  TIBC 175*  IRON 154   Sepsis Labs: No results for input(s): "PROCALCITON", "LATICACIDVEN" in the last 168 hours.  No results found for this or any previous visit (from the past 240 hour(s)).   Radiology Studies: DG Lumbar Spine Complete  Result Date: 07/01/2022 CLINICAL DATA:  Lower back pain and bilateral hip pain. EXAM: LUMBAR SPINE - COMPLETE 4+ VIEW COMPARISON:  Lumbar spine radiographs 12/05/2021 FINDINGS: There are 5 non-rib-bearing lumbar-type vertebral bodies. Normal frontal alignment. No sagittal spondylolisthesis vertebral body heights are maintained. Mild L5-S1 and minimal posterior L4-5 disc space narrowing. There is again increased sclerosis within the superior and inferior endplates  of the vertebral bodies ("rugger [place] spine "). No acute fracture is seen. IMPRESSION: Mild L5-S1 and minimal L4-5 degenerative disc changes. "[place] spine" consistent with sequela of chronic renal osteodystrophy. Electronically Signed   By: Yvonne Kendall M.D.   On: 07/01/2022 12:09   DG Hip Unilat W or Wo Pelvis 2-3 Views Right  Result Date: 07/01/2022 CLINICAL DATA:  Back pain and bilateral hip pain radiating down the legs for the past 5 months. EXAM: DG HIP (WITH OR WITHOUT PELVIS) 2-3V RIGHT; DG HIP (WITH OR WITHOUT PELVIS) 2-3V LEFT COMPARISON:  Right hip radiographs 06/24/2022 and pelvis and left hip radiographs 01/19/2022; CT pelvis 06/24/2022 FINDINGS: A peritoneal dialysis catheter is again coiled overlying the mid pelvis. Moderate to high-grade atherosclerotic calcifications. No significant change in  right-greater-than-left pubic body erosion and abnormal approximate 2 cm pubic symphysis diastasis compared to recent 06/24/2022 radiographs. This is again worsened from 02/18/2022 radiographs. Patchy lucencies overlying the bilateral sacroiliac joints, better seen on prior CT. A subacute fracture of the left iliac wing is again noted, with approximately 8 mm diastasis throughout the fracture extending from the inferior aspect of the left sacroiliac joint through the left superior iliac crest. 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 IMPRESSION: 1. No significant change in right-greater-than-left pubic body erosion and abnormal pubic symphysis diastasis compared to recent 06/24/2022 radiographs. 2. Subacute fracture of the left iliac wing with approximately 8 mm diastasis throughout the fracture. This was seen on recent CT. 3. Additional bilateral sacroiliac joint erosions. 4. All the pelvic erosions may be secondary to chronic renal osteodystrophy and secondary hyperparathyroidism. The weakened bone likely contributed to the left iliac wing subacute suspected insufficiency fracture. Electronically Signed   By: Yvonne Kendall M.D.   On: 07/01/2022 12:05   DG Hip Unilat W or Wo Pelvis 2-3 Views Left  Result Date: 07/01/2022 CLINICAL DATA:  Back pain and bilateral hip pain radiating down the legs for the past 5 months. EXAM: DG HIP (WITH OR WITHOUT PELVIS) 2-3V RIGHT; DG HIP (WITH OR WITHOUT PELVIS) 2-3V LEFT COMPARISON:  Right hip radiographs 06/24/2022 and pelvis and left hip radiographs 01/19/2022; CT pelvis 06/24/2022 FINDINGS: A peritoneal dialysis catheter is again coiled overlying the mid pelvis. Moderate to high-grade atherosclerotic calcifications. No significant change in right-greater-than-left pubic body erosion and abnormal approximate 2 cm pubic symphysis diastasis compared to recent 06/24/2022  radiographs. This is again worsened from 02/18/2022 radiographs. Patchy lucencies overlying the bilateral sacroiliac joints, better seen on prior CT. A subacute fracture of the left iliac wing is again noted, with approximately 8 mm diastasis throughout the fracture extending from the inferior aspect of the left sacroiliac joint through the left superior iliac crest. 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 IMPRESSION: 1. No significant change in right-greater-than-left pubic body erosion and abnormal pubic symphysis diastasis compared to recent 06/24/2022 radiographs. 2. Subacute fracture of the left iliac wing with approximately 8 mm diastasis throughout the fracture. This was seen on recent CT. 3. Additional bilateral sacroiliac joint erosions. 4. All the pelvic erosions may be secondary to chronic renal osteodystrophy and secondary hyperparathyroidism. The weakened bone likely contributed to the left iliac wing subacute suspected insufficiency fracture. Electronically Signed   By: Yvonne Kendall M.D.   On: 07/01/2022 12:05    Scheduled Meds:  amLODipine  5 mg  Oral Daily   cinacalcet  60 mg Oral Q breakfast   gentamicin cream  1 Application Topical Daily   heparin  5,000 Units Subcutaneous Q8H   labetalol  200 mg Oral BID   lisinopril  30 mg Oral QHS   pravastatin  20 mg Oral q1800   sevelamer carbonate  1,600 mg Oral TID WC   sodium bicarbonate  650 mg Oral TID   Continuous Infusions:  dialysis solution 1.5% low-MG/low-CA     dialysis solution 2.5% low-MG/low-CA       LOS: 0 days   Darliss Cheney, MD [facility]  07/03/2022, 11:48 AM   *Please note that this is a verbal dictation therefore any spelling or grammatical errors are due to the "[place]" system interpretation.  Please page via [place] and do not message via secure chat for urgent patient care matters. Secure chat  can be used for non urgent patient care matters.  How to contact the [facility] Attending or Consulting provider [place] or covering provider during after hours [place], for this patient?  Check the care team in [facility] and look for a) attending/consulting TRH provider listed and b) the [facility] team listed. Page or secure chat [phone]. Log into [email or web address] and use [place] universal password to access. If you do not have the password, please contact the hospital operator. Locate the [facility] provider you are looking for under [facility] and page to a number that you can be directly reached. If you still have difficulty reaching the provider, please page the [facility] (Director on Call) for the Hospitalists listed on amion for assistance.

## 2022-07-03 NOTE — Evaluation (Signed)
Physical Therapy Evaluation Patient Details Name: Derrick Moore MRN: [id number] DOB: 12-04-1965 Today's Date: 07/03/2022  History of Present Illness  Pt is a 56 yr old male who presented 07/01/22 due to increase in hip pain after bending over to pick up an item. Pt noted to have symptomatic anemia and required 2 units of PRBC 12/6.  PMH significant of ESRD on peritoneal dialysis, hypertension, stroke and dyslipidemia  Clinical Impression  Pt will benefit from skilled physical therapy services while in acute care hospital setting due to pt has to ambulate about 1/4 a mile from his car to his apartment. Currently pt is able to only ambulate short in room distance due to pain and fatigue. Pt states he has family that will be able to pick up from hospital and drop him off close to the entrance but at this time is unable to go this distance without assistance. Pt functional mobility otherwise is at a supervision to mod I level. Pt demonstrates no signs/symptoms of cardiac/respiratory distress during session this date.      Recommendations for follow up therapy are one component of a multi-disciplinary discharge planning process, led by the attending physician.  Recommendations may be updated based on patient status, additional functional criteria and insurance authorization.  Follow Up Recommendations No PT follow up (per pt request)      Assistance Recommended at Discharge  Pt will need assistance with getting to/from his front entrance  Patient can return home with the following  Assist for transportation    Equipment Recommendations None recommended by PT (pt has needed equipment at home)  Recommendations for Other Services       Functional Status Assessment Patient has had a recent decline in their functional status and demonstrates the ability to make significant improvements in function in a reasonable and predictable amount of time.     Precautions / Restrictions  Precautions Precautions: Fall Restrictions Weight Bearing Restrictions: No      Mobility  Bed Mobility Overal bed mobility: Modified Independent             General bed mobility comments: pt uses rails for bed mobility due to pain in the hips Patient Response: Cooperative  Transfers Overall transfer level: Needs assistance Equipment used: Rolling walker (2 wheels) Transfers: Sit to/from Stand Sit to Stand: Supervision           General transfer comment: cued on hand placement pt initially trying to pull up from RW and AD was tipping, education and pt requires bil hands on bed to push up    Ambulation/Gait Ambulation/Gait assistance: Supervision Gait Distance (Feet): 30 Feet Assistive device: Rolling walker (2 wheels) Gait Pattern/deviations: Decreased step length - left, Decreased step length - right, Antalgic   Gait velocity interpretation: <1.31 ft/sec, indicative of household ambulator   General Gait Details: uneven step length, decreased stance time on LE  Stairs Stairs:  (pt does not have stairs)             Balance Overall balance assessment: Mild deficits observed, not formally tested Sitting-balance support: Bilateral upper extremity supported, No upper extremity supported Sitting balance-Leahy Scale: Fair Sitting balance - Comments: pt leans posteriorly due to hip pain initially that then improves Postural control: Posterior lean Standing balance support: Bilateral upper extremity supported, During functional activity Standing balance-Leahy Scale: Fair  Pertinent Vitals/Pain Pain Assessment Pain Assessment: 0-10 Pain Score: 0-No pain    Home Living Family/patient expects to be discharged to:: Private residence Living Arrangements: Alone Available Help at Discharge: Available PRN/intermittently;Family Type of Home: Apartment Home Access: Elevator       Home Layout: One level Home Equipment:  Conservation officer, nature (2 wheels);Cane - single point;Grab bars - tub/shower;Grab bars - toilet      Prior Function Prior Level of Function : Independent/Modified Independent             Mobility Comments: Pt states that depending on if he has to work he might have to use the walker by the end of the day but usually he uses his SPC by the end of the day. ADLs Comments: Pt states that he was having some hip pain     Hand Dominance   Dominant Hand: Right    Extremity/Trunk Assessment   Upper Extremity Assessment Upper Extremity Assessment: Defer to OT evaluation    Lower Extremity Assessment Lower Extremity Assessment: Overall WFL for tasks assessed    Cervical / Trunk Assessment Cervical / Trunk Assessment: Normal  Communication   Communication: No difficulties  Cognition Arousal/Alertness: Awake/alert Behavior During Therapy: WFL for tasks assessed/performed Overall Cognitive Status: Within Functional Limits for tasks assessed         General Comments: pt states he is concerned about where he parked, but has had alot going on he is able to state he parked at [address]. He has someone who can get his car if he needs and his Daughter and mother are going to help move it.               Assessment/Plan    PT Assessment Patient needs continued PT services  PT Problem List Decreased strength;Decreased mobility;Decreased activity tolerance;Decreased balance;Pain       PT Treatment Interventions      PT Goals (Current goals can be found in the Care Plan section)  Acute Rehab PT Goals Patient Stated Goal: To return home. PT Goal Formulation: With patient Time For Goal Achievement: 07/17/22 Potential to Achieve Goals: Fair    Frequency Min 3X/week        AM-PAC PT "6 Clicks" Mobility  Outcome Measure Help needed turning from your back to your side while in a flat bed without using bedrails?: A Little Help needed moving from lying on your back to  sitting on the side of a flat bed without using bedrails?: A Little Help needed moving to and from a bed to a chair (including a wheelchair)?: A Little Help needed standing up from a chair using your arms (e.g., wheelchair or bedside chair)?: A Little Help needed to walk in hospital room?: A Little Help needed climbing 3-5 steps with a railing? : A Lot 6 Click Score: 17    End of Session Equipment Utilized During Treatment: Gait belt Activity Tolerance: Patient tolerated treatment well;Patient limited by pain Patient left: in bed;with call bell/phone within reach   PT Visit Diagnosis: Unsteadiness on feet (R26.81);Muscle weakness (generalized) (M62.81);Other abnormalities of gait and mobility (R26.89)    Time: 2585-2778 PT Time Calculation (min) (ACUTE ONLY): 24 min   Charges:   PT Evaluation $PT Eval Low Complexity: 1 Low PT Treatments $Therapeutic Activity: 8-22 mins        Tomma Rakers, DPT, CLT  Acute Rehabilitation Services Office: [phone] (Secure chat preferred)   Ander Purpura 07/03/2022, 1:52 PM

## 2022-07-03 NOTE — Evaluation (Signed)
Occupational Therapy Evaluation Patient Details Name: Derrick Moore MRN: [id number] DOB: 1965-10-03 Today's Date: 07/03/2022   History of Present Illness Pt is a 56 yr old male who presented 07/01/22 due to increase in hip pain after bending over to pick up an item. Pt noted to have symptomatic anemia and required 2 units of PRBC 12/6.  PMH significant of ESRD on peritoneal dialysis, hypertension, stroke and dyslipidemia   Clinical Impression   Pt reports at baseline they are driving and working but as they day progresses they may require a cane or walker due to pain in BLE. Pt lives alone in an apartment but reported they could have assist from family for groceries with the return to home. Derrick Moore was independent for UE ADLS and supervision to min guard with LE ADLS due to BLE pain. Pt as educated on AE for LE ADLs and shower seat. Pt currently with functional limitations due to the deficits listed below (see OT Problem List).  Pt will benefit from skilled OT to increase their safety and independence with ADL and functional mobility for ADL to facilitate discharge to venue listed below.       Recommendations for follow up therapy are one component of a multi-disciplinary discharge planning process, led by the attending physician.  Recommendations may be updated based on patient status, additional functional criteria and insurance authorization.   Follow Up Recommendations  No OT follow up     Assistance Recommended at Discharge Intermittent Supervision/Assistance  Patient can return home with the following Assist for transportation;Assistance with cooking/housework    Functional Status Assessment  Patient has had a recent decline in their functional status and demonstrates the ability to make significant improvements in function in a reasonable and predictable amount of time.  Equipment Recommendations  Tub/shower seat    Recommendations for Other Services       Precautions /  Restrictions Precautions Precautions: Fall Restrictions Weight Bearing Restrictions: No      Mobility Bed Mobility               General bed mobility comments: Prsented standing at EOB    Transfers Overall transfer level: Needs assistance Equipment used: Rolling walker (2 wheels) Transfers: Sit to/from Stand Sit to Stand: Supervision           General transfer comment: cued on hand placement      Balance Overall balance assessment: Needs assistance Sitting-balance support: No upper extremity supported Sitting balance-Leahy Scale: Good     Standing balance support: Bilateral upper extremity supported, During functional activity, No upper extremity supported Standing balance-Leahy Scale: Fair                             ADL either performed or assessed with clinical judgement   ADL Overall ADL's : Needs assistance/impaired Eating/Feeding: Independent;Sitting   Grooming: Wash/dry hands;Wash/dry face;Supervision/safety;Standing   Upper Body Bathing: Independent;Sitting   Lower Body Bathing: Supervison/ safety;Sit to/from stand   Upper Body Dressing : Independent;Sitting   Lower Body Dressing: Supervision/safety;Sit to/from stand   Toilet Transfer: Agricultural engineer (2 wheels)   Toileting- Water quality scientist and Hygiene: Min guard;Sit to/from stand   Tub/ Shower Transfer: Supervision/safety;Rolling walker (2 wheels);Cueing for safety;Cueing for sequencing   Functional mobility during ADLs: Min guard;Rolling walker (2 wheels)       Vision         Perception     Praxis  Pertinent Vitals/Pain Pain Assessment Pain Assessment: Faces Faces Pain Scale: Hurts a little bit Pain Location: BLE Pain Descriptors / Indicators: Aching Pain Intervention(s): Limited activity within patient's tolerance, Monitored during session, Repositioned     Hand Dominance Right   Extremity/Trunk Assessment Upper Extremity Assessment Upper  Extremity Assessment: Overall WFL for tasks assessed       Cervical / Trunk Assessment Cervical / Trunk Assessment: Kyphotic   Communication Communication Communication: No difficulties   Cognition Arousal/Alertness: Awake/alert Behavior During Therapy: WFL for tasks assessed/performed Overall Cognitive Status: Difficult to assess                                 General Comments: Pt noted the last couple of days that it has been difficult to keep track of everything going on as in/out of [facility] offices.     General Comments       Exercises     Shoulder Instructions      Home Living Family/patient expects to be discharged to:: Private residence Living Arrangements: Alone Available Help at Discharge: Available PRN/intermittently;Family Type of Home: Apartment Home Access: Elevator     Home Layout: One level     Bathroom Shower/Tub: Walk-in shower;Curtain     Bathroom Accessibility: Yes   Home Equipment: Conservation officer, nature (2 wheels);Cane - single point          Prior Functioning/Environment Prior Level of Function : Independent/Modified Independent             Mobility Comments: Pt will often start the day without use of any devices but as the day goes on they will go to cane to walker ADLs Comments: Pt reported they were completing but limited due to pain        OT Problem List: Decreased strength;Decreased activity tolerance;Impaired balance (sitting and/or standing);Decreased knowledge of use of DME or AE;Pain      OT Treatment/Interventions: Self-care/ADL training;DME and/or AE instruction;Therapeutic activities;Patient/family education;Balance training    OT Goals(Current goals can be found in the care plan section) Acute Rehab OT Goals Patient Stated Goal: to get back to normal OT Goal Formulation: With patient Time For Goal Achievement: 07/17/22 Potential to Achieve Goals: Good ADL Goals Pt Will Perform Lower Body Bathing: with  modified independence;with adaptive equipment Pt Will Transfer to Toilet: with modified independence;ambulating Pt Will Perform Tub/Shower Transfer: Shower transfer;with modified independence;ambulating  OT Frequency: Min 2X/week    Co-evaluation              AM-PAC OT "6 Clicks" Daily Activity     Outcome Measure Help from another person eating meals?: None Help from another person taking care of personal grooming?: None Help from another person toileting, which includes using toliet, bedpan, or urinal?: A Little Help from another person bathing (including washing, rinsing, drying)?: A Little Help from another person to put on and taking off regular upper body clothing?: None Help from another person to put on and taking off regular lower body clothing?: A Little 6 Click Score: 21   End of Session Equipment Utilized During Treatment: Gait belt;Rolling walker (2 wheels) Nurse Communication: Mobility status  Activity Tolerance: Patient tolerated treatment well Patient left: in chair;with call bell/phone within reach  OT Visit Diagnosis: Unsteadiness on feet (R26.81);Other abnormalities of gait and mobility (R26.89);Repeated falls (R29.6);Muscle weakness (generalized) (M62.81);History of falling (Z91.81);Pain Pain - Right/Left: Left Pain - part of body:  (BLE)  Time: 1007-1219 OT Time Calculation (min): 29 min Charges:  OT General Charges $OT Visit: 1 Visit OT Evaluation $OT Eval Low Complexity: 1 Low OT Treatments $Self Care/Home Management : 8-22 mins  Joeseph Amor OTR/L  Acute Rehab Services  [phone] office number    Joeseph Amor 07/03/2022, 9:10 AM

## 2022-07-03 NOTE — Progress Notes (Signed)
Subjective:  PD done overnight-  hgb stable to improved over last 20 hours- he is alert Objective Vital signs in last 24 hours: Vitals:   07/02/22 2036 07/03/22 0500 07/03/22 0531 07/03/22 0825  BP: 134/76 126/65  (!) 131/96  Pulse: 87 89  80  Resp: 17 16  16   Temp: 98.2 F (36.8 C) 99.1 F (37.3 C)  98.5 F (36.9 C)  TempSrc:    Oral  SpO2: 100% 98%  100%  Weight: 70.3 kg 66.6 kg 70.3 kg   Height: 5\' 10"  (1.778 m)      Weight change:   Intake/Output Summary (Last 24 hours) at 07/03/2022 1034 Last data filed at 07/03/2022 0827 Gross per 24 hour  Intake --  Output 975 ml  Net -975 ml    Dialyzes at [employer]  -  says that he only does 3 exchanges in a 24 hour period - does not seem like a lot-   2L 2.5 % fluid- he does manual exchanges -  BUN over 100 so might not be that compliant.  He says they have been giving him iron and ESA at his [facility] center    Assessment/Plan: 56 year old BM-  longstanding ESRD-   possibly not compliant-  presents with hip pain and x-rays c/w renal osteodystrophy and also hgb in the 5's  1 hip pain-  x-rays c/w severe renal osteodystrophy-  unfortunately this has probably been in the making for a decade and there will be no quick fix.  PTH of over 2000 2 years ago-  will continue sensipar-  does not know the binder he is on-  will start renvela  2 ESRD: on PD-  with BUN over 100 may not be that compliant-  will order PD at night-  increase the number of exchanges to 4-  tolerated well-  no change with PD tonight  3 Hypertension: seems controlled-  does not seem overloaded-  says he makes urine-  always uses 2.5 % at home- will do a combo of 1.5 and 2.5-  UF less than 200 but made 800 of urine  4. Anemia of ESRD: has this but also may have a GI source of bleed-  will check stool cards and might want to consider getting GI involved-  he is on plavix- may want to hold.  GI has seen planning an EGD and colon but probably canto do til next week.  Also will  give DDAVP for platelet dysfunction in the setting of uremia.  iron stores high-   givin ESA  5. Metabolic Bone Disease: as above-  unlikely to find quick fix-  PT/pain control    Allissa Albright A Jawaun Celmer    Labs: Basic Metabolic Panel: Recent Labs  Lab 07/01/22 1511 07/02/22 0310  NA 136 132*  K 5.0 4.8  CL 108 104  CO2 12* 13*  GLUCOSE 98 92  BUN 111* 112*  CREATININE 19.02* 18.36*  CALCIUM 7.4* 6.9*  PHOS  --  8.6*   Liver Function Tests: Recent Labs  Lab 07/02/22 0310  ALBUMIN 2.7*   No results for input(s): "LIPASE", "AMYLASE" in the last 168 hours. No results for input(s): "AMMONIA" in the last 168 hours. CBC: Recent Labs  Lab 07/01/22 1511 07/02/22 0310 07/02/22 1221 07/03/22 0842  WBC 5.7 4.6  --  5.5  NEUTROABS 3.9  --   --   --   HGB 5.2* 6.5* 8.1* 8.4*  HCT 16.4* 19.4* 25.0* 25.1*  MCV 95.9 91.9  --  90.6  PLT 174 155  --  166   Cardiac Enzymes: No results for input(s): "CKTOTAL", "CKMB", "CKMBINDEX", "TROPONINI" in the last 168 hours. CBG: No results for input(s): "GLUCAP" in the last 168 hours.  Iron Studies:  Recent Labs    07/03/22 0317  IRON 154  TIBC 175*  FERRITIN 1,019*   Studies/Results: DG Lumbar Spine Complete  Result Date: 07/01/2022 CLINICAL DATA:  Lower back pain and bilateral hip pain. EXAM: LUMBAR SPINE - COMPLETE 4+ VIEW COMPARISON:  Lumbar spine radiographs 12/05/2021 FINDINGS: There are 5 non-rib-bearing lumbar-type vertebral bodies. Normal frontal alignment. No sagittal spondylolisthesis vertebral body heights are maintained. Mild L5-S1 and minimal posterior L4-5 disc space narrowing. There is again increased sclerosis within the superior and inferior endplates of the vertebral bodies ("rugger [place] spine "). No acute fracture is seen. IMPRESSION: Mild L5-S1 and minimal L4-5 degenerative disc changes. "[place] spine" consistent with sequela of chronic renal osteodystrophy. Electronically Signed   By: Yvonne Kendall M.D.    On: 07/01/2022 12:09   DG Hip Unilat W or Wo Pelvis 2-3 Views Right  Result Date: 07/01/2022 CLINICAL DATA:  Back pain and bilateral hip pain radiating down the legs for the past 5 months. EXAM: DG HIP (WITH OR WITHOUT PELVIS) 2-3V RIGHT; DG HIP (WITH OR WITHOUT PELVIS) 2-3V LEFT COMPARISON:  Right hip radiographs 06/24/2022 and pelvis and left hip radiographs 01/19/2022; CT pelvis 06/24/2022 FINDINGS: A peritoneal dialysis catheter is again coiled overlying the mid pelvis. Moderate to high-grade atherosclerotic calcifications. No significant change in right-greater-than-left pubic body erosion and abnormal approximate 2 cm pubic symphysis diastasis compared to recent 06/24/2022 radiographs. This is again worsened from 02/18/2022 radiographs. Patchy lucencies overlying the bilateral sacroiliac joints, better seen on prior CT. A subacute fracture of the left iliac wing is again noted, with approximately 8 mm diastasis throughout the fracture extending from the inferior aspect of the left sacroiliac joint through the left superior iliac crest. 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 IMPRESSION: 1. No significant change in right-greater-than-left pubic body erosion and abnormal pubic symphysis diastasis compared to recent 06/24/2022 radiographs. 2. Subacute fracture of the left iliac wing with approximately 8 mm diastasis throughout the fracture. This was seen on recent CT. 3. Additional bilateral sacroiliac joint erosions. 4. All the pelvic erosions may be secondary to chronic renal osteodystrophy and secondary hyperparathyroidism. The weakened bone likely contributed to the left iliac wing subacute suspected insufficiency fracture. Electronically Signed   By: Yvonne Kendall M.D.   On: 07/01/2022 12:05   DG Hip Unilat W or Wo Pelvis 2-3 Views Left  Result Date: 07/01/2022 CLINICAL DATA:  Back pain and  bilateral hip pain radiating down the legs for the past 5 months. EXAM: DG HIP (WITH OR WITHOUT PELVIS) 2-3V RIGHT; DG HIP (WITH OR WITHOUT PELVIS) 2-3V LEFT COMPARISON:  Right hip radiographs 06/24/2022 and pelvis and left hip radiographs 01/19/2022; CT pelvis 06/24/2022 FINDINGS: A peritoneal dialysis catheter is again coiled overlying the mid pelvis. Moderate to high-grade atherosclerotic calcifications. No significant change in right-greater-than-left pubic body erosion and abnormal approximate 2 cm pubic symphysis diastasis compared to recent 06/24/2022 radiographs. This is again worsened from 02/18/2022 radiographs. Patchy lucencies overlying the bilateral sacroiliac joints, better seen on prior CT. A subacute fracture of the left iliac wing is again noted, with approximately 8 mm diastasis throughout the fracture extending from the inferior aspect of the left  sacroiliac joint through the left superior iliac crest. 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 IMPRESSION: 1. No significant change in right-greater-than-left pubic body erosion and abnormal pubic symphysis diastasis compared to recent 06/24/2022 radiographs. 2. Subacute fracture of the left iliac wing with approximately 8 mm diastasis throughout the fracture. This was seen on recent CT. 3. Additional bilateral sacroiliac joint erosions. 4. All the pelvic erosions may be secondary to chronic renal osteodystrophy and secondary hyperparathyroidism. The weakened bone likely contributed to the left iliac wing subacute suspected insufficiency fracture. Electronically Signed   By: Yvonne Kendall M.D.   On: 07/01/2022 12:05   Medications: Infusions:  desmopressin (DDAVP) 20 mcg in sodium chloride 0.9 % 50 mL IVPB     dialysis solution 1.5% low-MG/low-CA     dialysis solution 2.5% low-MG/low-CA      Scheduled Medications:  amLODipine  5 mg Oral Daily    cinacalcet  60 mg Oral Q breakfast   gentamicin cream  1 Application Topical Daily   heparin  5,000 Units Subcutaneous Q8H   labetalol  200 mg Oral BID   lisinopril  30 mg Oral QHS   pravastatin  20 mg Oral q1800   sodium bicarbonate  650 mg Oral TID    have reviewed scheduled and prn medications.  Physical Exam: General:  alert, nad Heart: RRR Lungs: mostly clear Abdomen: soft, non tender Extremities: really no edema  Dialysis Access: PD cath-  non tender    07/03/2022,10:34 AM  LOS: 0 days

## 2022-07-04 DIAGNOSIS — N186 End stage renal disease: Secondary | ICD-10-CM | POA: Diagnosis not present

## 2022-07-04 DIAGNOSIS — D649 Anemia, unspecified: Secondary | ICD-10-CM | POA: Diagnosis not present

## 2022-07-04 DIAGNOSIS — M25559 Pain in unspecified hip: Secondary | ICD-10-CM | POA: Diagnosis not present

## 2022-07-04 DIAGNOSIS — Z992 Dependence on renal dialysis: Secondary | ICD-10-CM | POA: Diagnosis not present

## 2022-07-04 DIAGNOSIS — I120 Hypertensive chronic kidney disease with stage 5 chronic kidney disease or end stage renal disease: Secondary | ICD-10-CM | POA: Diagnosis not present

## 2022-07-04 DIAGNOSIS — D631 Anemia in chronic kidney disease: Secondary | ICD-10-CM | POA: Diagnosis not present

## 2022-07-04 DIAGNOSIS — N250 Renal osteodystrophy: Secondary | ICD-10-CM | POA: Diagnosis not present

## 2022-07-04 LAB — CBC WITH DIFFERENTIAL/PLATELET
Abs Immature Granulocytes: 0.03 10*3/uL (ref 0.00–0.07)
Basophils Absolute: 0 10*3/uL (ref 0.0–0.1)
Basophils Relative: 0 %
Eosinophils Absolute: 0.4 10*3/uL (ref 0.0–0.5)
Eosinophils Relative: 7 %
HCT: 23.1 % — ABNORMAL LOW (ref 39.0–52.0)
Hemoglobin: 7.7 g/dL — ABNORMAL LOW (ref 13.0–17.0)
Immature Granulocytes: 1 %
Lymphocytes Relative: 12 %
Lymphs Abs: 0.7 10*3/uL (ref 0.7–4.0)
MCH: 30.2 pg (ref 26.0–34.0)
MCHC: 33.3 g/dL (ref 30.0–36.0)
MCV: 90.6 fL (ref 80.0–100.0)
Monocytes Absolute: 0.7 10*3/uL (ref 0.1–1.0)
Monocytes Relative: 12 %
Neutro Abs: 3.7 10*3/uL (ref 1.7–7.7)
Neutrophils Relative %: 68 %
Platelets: 152 10*3/uL (ref 150–400)
RBC: 2.55 MIL/uL — ABNORMAL LOW (ref 4.22–5.81)
RDW: 15.5 % (ref 11.5–15.5)
WBC: 5.5 10*3/uL (ref 4.0–10.5)
nRBC: 0 % (ref 0.0–0.2)

## 2022-07-04 MED ORDER — DELFLEX-LC/1.5% DEXTROSE 344 MOSM/L IP SOLN: INTRAPERITONEAL | Status: DC

## 2022-07-04 MED ORDER — CLOPIDOGREL BISULFATE 75 MG PO TABS: 75.0000 mg | ORAL_TABLET | Freq: Every day | ORAL | 3 refills | Status: DC

## 2022-07-04 NOTE — Progress Notes (Signed)
Mobility Specialist - Progress Note   07/04/22 1000  Mobility  Activity Ambulated with assistance in hallway  Level of Assistance Standby assist, set-up cues, supervision of patient - no hands on  Assistive Device Front wheel walker  Distance Ambulated (ft) 200 ft  Activity Response Tolerated well  Mobility Referral Yes  $Mobility charge 1 Mobility    Pt received in bed agreeable to mobility. No complaints throughout. Left sitting EOB w/ call bell in reach and all needs met.   [place] Specialist Please contact via SecureChat or Rehab office at [phone]

## 2022-07-04 NOTE — Discharge Summary (Signed)
Physician Discharge Summary  Derrick Moore EUM:[id number] DOB: Jul 02, 1966 DOA: 07/01/2022  PCP: [name], PA-C  Admit date: 07/01/2022 Discharge date: 07/04/2022 30 Day Unplanned Readmission Risk Score    Flowsheet Row ED to Hosp-Admission (Discharged) from 07/01/2022 in [place] [facility]  SURGICAL  30 Day Unplanned Readmission Risk Score (%) 25.93 Filed at 07/04/2022 1200       This score is the patient's risk of an unplanned readmission within 30 days of being discharged (0 -100%). The score is based on dignosis, age, lab data, medications, orders, and past utilization.   Low:  0-14.9   Medium: 15-21.9   High: 22-29.9   Extreme: 30 and above          Admitted From: Home Disposition: Left AGAINST MEDICAL ADVICE  Recommendations for Outpatient Follow-up:  Follow up with PCP in 1-2 weeks Please obtain BMP/CBC in one week Please follow up with your PCP on the following pending results: Unresulted Labs (From admission, onward)     Start     Ordered   07/05/22 0500  Renal function panel  Tomorrow morning,   R       Question:  Specimen collection method  Answer:  Lab=Lab collect   07/04/22 1158   07/04/22 0500  CBC with Differential/Platelet  Daily,   R      07/03/22 1149   Unscheduled  Occult blood card to lab, stool  As needed,   R      07/02/22 1342              Discharge Condition: Fair CODE STATUS: Full code Diet recommendation: Cardiac  Subjective: Seen and examined.  No complaints.  Brief/Interim Summary: Derrick Moore is a 56 y.o. male with medical history significant of ESRD on peritoneal dialysis, chronic bilateral hip pain, anemia of chronic disease, hypertension who presented to the emergency room with complaints of ongoing bilateral hip pain for the last several months, worse recently.  His pain got worse after he bent over to pick up an object last week.  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 Taking Tylenol Motrin with some relief.  He was seen in the emergency room last week and CT scan showing extensive osteodystrophy from his renal disease, he left as he had to go home.[name]     Upon arrival to ED, he was hemodynamically stable.  Hemoglobin 5.2 with baseline hemoglobin about 7.  Admitted to hospital service for symptomatic anemia.  Symptomatic anemia/acute blood loss anemia//intermittent hematochezia: Patient is also having intermittent bloody stool. received total 3 units PRBC, hemoglobin 7.7 today.  FOBT pending.  GI on board.  Scheduled for EGD and colonoscopy on Monday, once Plavix washout for 5 days is completed Receives Epogen at his dialysis center.  He was aware of the plan however reportedly after I saw him this morning, he told the nurse that his apartment is full date and that he needed to leave.  I personally spoke to the patient and explained to him the possible consequences of leaving [place] when his hemoglobin is dropping, such as anemia which can lead to cardiac arrest and death.  He verbalized understanding but despite of that he signed [place] papers and left.  I told him that I will relay this to the endoscopy unit staff so that they can call him so that he can come on Monday directly to  the endoscopy for colonoscopy and EGD.  I provided his number to [facility] of endoscopy.  He was advised to avoid taking ibuprofen, Advil or naproxen or any other NSAID.   Essential hypertension: Blood pressure within goal. -Continue home lisinopril and amlodipine   ESRD on dialysis ([place]) Significantly elevated BUN and creatinine.  Patient is doing peritoneal dialysis for the past many years.  He received his peritoneal dialysis here, nephrology was consulted.   Dyslipidemia -Continue home pravastatin   Ambulatory dysfunction -PT/OT evaluation   Sacroiliac joint dysfunction of right  side Causing significant ambulatory dysfunction. Imaging consistent with renal osteodystrophy, patient has parathyroid levels above 2021.  Nephrology is on board. -Continue with Sensipar -Pain management -PT/OT evaluation  Discharge plan was discussed with patient and/or family member and they verbalized understanding and agreed with it.  Discharge Diagnoses:  Principal Problem:   Symptomatic anemia Active Problems:   Hypertension   ESRD on dialysis [facility])   Dyslipidemia   Sacroiliac joint dysfunction of right side   Ambulatory dysfunction   Acute blood loss anemia (ABLA)   Acute blood loss anemia    Discharge Instructions   Allergies as of 07/04/2022   No Known Allergies      Medication List     STOP taking these medications    ibuprofen 200 MG tablet Commonly known as: ADVIL   naproxen 500 MG tablet Commonly known as: NAPROSYN       TAKE these medications    amLODipine 5 MG tablet Commonly known as: NORVASC Take 5 mg by mouth at bedtime.   cinacalcet 60 MG tablet Commonly known as: SENSIPAR Take 60 mg by mouth daily.   clopidogrel 75 MG tablet Commonly known as: PLAVIX Take 1 tablet (75 mg total) by mouth daily. Start taking on: July 09, 2022 What changed: These instructions start on July 09, 2022. If you are unsure what to do until then, ask your doctor or other care provider.   HYDROcodone-acetaminophen 5-325 MG tablet Commonly known as: NORCO/VICODIN Take 1 tablet by mouth every 8 (eight) hours as needed.   labetalol 200 MG tablet Commonly known as: NORMODYNE Take 200 mg by mouth 2 (two) times daily.   lidocaine 5 % Commonly known as: Lidoderm Place 1 patch onto the skin daily. Remove & Discard patch within 12 hours or as directed by MD   lisinopril 30 MG tablet Commonly known as: ZESTRIL Take 30 mg by mouth at bedtime.   ondansetron 4 MG tablet Commonly known as: ZOFRAN Take 1 tablet (4 mg total) by mouth every 6 (six) hours.    pravastatin 20 MG tablet Commonly known as: PRAVACHOL Take by mouth.   PRESCRIPTION MEDICATION Tylenol 3   SPS 15 GM/60ML suspension Generic drug: sodium polystyrene Take 60 ml by mouth daily for 2 days.        No Known Allergies  Consultations: GI and nephrology   Procedures/Studies: DG Lumbar Spine Complete  Result Date: 07/01/2022 CLINICAL DATA:  Lower back pain and bilateral hip pain. EXAM: LUMBAR SPINE - COMPLETE 4+ VIEW COMPARISON:  Lumbar spine radiographs 12/05/2021 FINDINGS: There are 5 non-rib-bearing lumbar-type vertebral bodies. Normal frontal alignment. No sagittal spondylolisthesis vertebral body heights are maintained. Mild L5-S1 and minimal posterior L4-5 disc space narrowing. There is again increased sclerosis within the superior and inferior endplates of the vertebral bodies ("rugger [place] spine "). No acute fracture is seen. IMPRESSION: Mild L5-S1 and minimal L4-5 degenerative disc changes. "[place]ger [place] spine" consistent with sequela of chronic  renal osteodystrophy. Electronically Signed   By: Yvonne Kendall M.D.   On: 07/01/2022 12:09   DG Hip Unilat W or Wo Pelvis 2-3 Views Right  Result Date: 07/01/2022 CLINICAL DATA:  Back pain and bilateral hip pain radiating down the legs for the past 5 months. EXAM: DG HIP (WITH OR WITHOUT PELVIS) 2-3V RIGHT; DG HIP (WITH OR WITHOUT PELVIS) 2-3V LEFT COMPARISON:  Right hip radiographs 06/24/2022 and pelvis and left hip radiographs 01/19/2022; CT pelvis 06/24/2022 FINDINGS: A peritoneal dialysis catheter is again coiled overlying the mid pelvis. Moderate to high-grade atherosclerotic calcifications. No significant change in right-greater-than-left pubic body erosion and abnormal approximate 2 cm pubic symphysis diastasis compared to recent 06/24/2022 radiographs. This is again worsened from 02/18/2022 radiographs. Patchy lucencies overlying the bilateral sacroiliac joints, better seen on prior CT. A subacute fracture of the  left iliac wing is again noted, with approximately 8 mm diastasis throughout the fracture extending from the inferior aspect of the left sacroiliac joint through the left superior iliac crest. 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 IMPRESSION: 1. No significant change in right-greater-than-left pubic body erosion and abnormal pubic symphysis diastasis compared to recent 06/24/2022 radiographs. 2. Subacute fracture of the left iliac wing with approximately 8 mm diastasis throughout the fracture. This was seen on recent CT. 3. Additional bilateral sacroiliac joint erosions. 4. All the pelvic erosions may be secondary to chronic renal osteodystrophy and secondary hyperparathyroidism. The weakened bone likely contributed to the left iliac wing subacute suspected insufficiency fracture. Electronically Signed   By: Yvonne Kendall M.D.   On: 07/01/2022 12:05   DG Hip Unilat W or Wo Pelvis 2-3 Views Left  Result Date: 07/01/2022 CLINICAL DATA:  Back pain and bilateral hip pain radiating down the legs for the past 5 months. EXAM: DG HIP (WITH OR WITHOUT PELVIS) 2-3V RIGHT; DG HIP (WITH OR WITHOUT PELVIS) 2-3V LEFT COMPARISON:  Right hip radiographs 06/24/2022 and pelvis and left hip radiographs 01/19/2022; CT pelvis 06/24/2022 FINDINGS: A peritoneal dialysis catheter is again coiled overlying the mid pelvis. Moderate to high-grade atherosclerotic calcifications. No significant change in right-greater-than-left pubic body erosion and abnormal approximate 2 cm pubic symphysis diastasis compared to recent 06/24/2022 radiographs. This is again worsened from 02/18/2022 radiographs. Patchy lucencies overlying the bilateral sacroiliac joints, better seen on prior CT. A subacute fracture of the left iliac wing is again noted, with approximately 8 mm diastasis throughout the fracture extending from the inferior aspect of the  left sacroiliac joint through the left superior iliac crest. 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 IMPRESSION: 1. No significant change in right-greater-than-left pubic body erosion and abnormal pubic symphysis diastasis compared to recent 06/24/2022 radiographs. 2. Subacute fracture of the left iliac wing with approximately 8 mm diastasis throughout the fracture. This was seen on recent CT. 3. Additional bilateral sacroiliac joint erosions. 4. All the pelvic erosions may be secondary to chronic renal osteodystrophy and secondary hyperparathyroidism. The weakened bone likely contributed to the left iliac wing subacute suspected insufficiency fracture. Electronically Signed   By: Yvonne Kendall M.D.   On: 07/01/2022 12:05   DG Hip Unilat  With Pelvis 2-3 Views Right  Addendum Date: 06/25/2022   ADDENDUM REPORT: 06/25/2022 11:03 ADDENDUM: Subsequent Pelvis CT on this patient 1631 hours yesterday reveals a healing, long segment linear fracture through the  left iliac wing - corresponding to conspicuous curvilinear lucency of the left iliac bone on this exam mistaken as artifact initially. Please see that report for additional details. Electronically Signed   By: Genevie Ann M.D.   On: 06/25/2022 11:03   Result Date: 06/25/2022 CLINICAL DATA:  56 year old male "felt a pop" in right buttock when bending and persistent right hip and buttock pain. EXAM: DG HIP (WITH OR WITHOUT PELVIS) 2-3V RIGHT COMPARISON:  Right hip series 11/21/2021. CT Abdomen and Pelvis 03/16/2022. FINDINGS: Chronic peritoneal dialysis type catheter now projects in the central pelvis. Negative visible bowel gas pattern. Progressive bone resorption at the pubic symphysis, pronounced since 11/22/2022 but also appears progressed from the August CT Abdomen and Pelvis. There is now roughly 1.7 cm of pubic symphysis diastasis and the pubic bones now appear  offset. No discrete pubic ramus fracture identified. Similar changes at both SI joints in August are less apparent radiographically. No other pelvis fracture identified. Femoral heads appear to remain normally located. Grossly intact proximal left femur. Proximal right femur appears stable and intact. IMPRESSION: 1. 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 2. But no discrete fracture of the pelvis or right hip identified. 3. Chronic pelvic peritoneal dialysis catheter. Electronically Signed: By: Genevie Ann M.D. On: 06/24/2022 12:56   CT PELVIS WO CONTRAST  Result Date: 06/24/2022 CLINICAL DATA:  Posterior pain right EXAM: CT PELVIS WITHOUT CONTRAST TECHNIQUE: Multidetector CT imaging of the pelvis was performed following the standard protocol without intravenous contrast. RADIATION DOSE REDUCTION: This exam was performed according to the departmental dose-optimization program which includes automated exposure control, adjustment of the mA and/or kV according to patient size and/or use of iterative reconstruction technique. COMPARISON:  03/16/2022 FINDINGS: Urinary Tract:  Urinary bladder is distended. Bowel: Visualized small bowel and colon are nondilated, unremarkable. Vascular/Lymphatic: Scattered aortoiliac calcified atheromatous plaque. No pelvic adenopathy localized. Reproductive:  No mass or other significant abnormality Other: Peritoneal dialysis catheter extends to the cul-de-sac. There is a small amount of free pelvic fluid. No free air. Musculoskeletal: Bilateral sacroiliitis with erosive changes left greater than right. Subacute fracture of the left iliac wing distracted less than 1 cm, margins somewhat indistinct, with periosteal reaction ; in  retrospect a subtle fracture line is evident on prior CT 03/16/2022. Extensive erosive changes about the pubic symphysis. Cortical avulsion injury from the left ischial tuberosity, more conspicuous than on 03/16/2022. Subacute ill-defined cortical avulsion injury from the right anterior inferior iliac spine, more conspicuous than 03/16/2022. Large Schmorl's node in the superior endplate of L4 as before. Degenerative disc disease L4-S1 stable. 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 IMPRESSION: 1. No definite acute bone abnormality. 2. Increased conspicuity of subacute left iliac wing fracture, left ischial tuberosity avulsion, and right anterior inferior iliac spine avulsion since 03/16/2022. 3. Erosive changes about the pubic symphysis and bilateral sacroiliac joints as before. Aortic Atherosclerosis (ICD10-I70.0). Electronically Signed   By: Lucrezia Europe M.D.   On: 06/24/2022 17:05     Discharge Exam: Vitals:   07/04/22 0503 07/04/22 0851  BP: 125/84 119/85  Pulse: 82 80  Resp:  17  Temp: 98.3 F (36.8 C) 98.7 F (37.1 C)  SpO2: 98% 99%   Vitals:   07/03/22 2015 07/04/22  0500 07/04/22 0503 07/04/22 0851  BP:   125/84 119/85  Pulse:   82 80  Resp:    17  Temp:   98.3 F (36.8 C) 98.7 F (37.1 C)  TempSrc:   Oral Oral  SpO2:   98% 99%  Weight: 65.6 kg 65.6 kg    Height:        General: Pt is alert, awake, not in acute distress Cardiovascular: RRR, S1/S2 +, no rubs, no gallops Respiratory: CTA bilaterally, no wheezing, no rhonchi Abdominal: Soft, NT, ND, bowel sounds + Extremities: no edema, no cyanosis    The results of significant diagnostics from this hospitalization (including imaging, microbiology, ancillary and laboratory) are listed below for reference.     Microbiology: No results found for this or any previous visit (from the past 240 hour(s)).   Labs: BNP (last 3 results) No results for input(s): "BNP" in the last  8760 hours. Basic Metabolic Panel: Recent Labs  Lab 07/01/22 1511 07/02/22 0310  NA 136 132*  K 5.0 4.8  CL 108 104  CO2 12* 13*  GLUCOSE 98 92  BUN 111* 112*  CREATININE 19.02* 18.36*  CALCIUM 7.4* 6.9*  PHOS  --  8.6*   Liver Function Tests: Recent Labs  Lab 07/02/22 0310  ALBUMIN 2.7*   No results for input(s): "LIPASE", "AMYLASE" in the last 168 hours. No results for input(s): "AMMONIA" in the last 168 hours. CBC: Recent Labs  Lab 07/01/22 1511 07/02/22 0310 07/02/22 1221 07/03/22 0842 07/04/22 0343  WBC 5.7 4.6  --  5.5 5.5  NEUTROABS 3.9  --   --   --  3.7  HGB 5.2* 6.5* 8.1* 8.4* 7.7*  HCT 16.4* 19.4* 25.0* 25.1* 23.1*  MCV 95.9 91.9  --  90.6 90.6  PLT 174 155  --  166 152   Cardiac Enzymes: No results for input(s): "CKTOTAL", "CKMB", "CKMBINDEX", "TROPONINI" in the last 168 hours. BNP: Invalid input(s): "POCBNP" CBG: No results for input(s): "GLUCAP" in the last 168 hours. D-Dimer No results for input(s): "DDIMER" in the last 72 hours. Hgb A1c No results for input(s): "HGBA1C" in the last 72 hours. Lipid Profile No results for input(s): "CHOL", "HDL", "LDLCALC", "TRIG", "CHOLHDL", "LDLDIRECT" in the last 72 hours. Thyroid function studies No results for input(s): "TSH", "T4TOTAL", "T3FREE", "THYROIDAB" in the last 72 hours.  Invalid input(s): "FREET3" Anemia work up Recent Labs    07/03/22 0317  FERRITIN 1,019*  TIBC 175*  IRON 154   Urinalysis No results found for: "COLORURINE", "APPEARANCEUR", "LABSPEC", "PHURINE", "GLUCOSEU", "HGBUR", "BILIRUBINUR", "KETONESUR", "PROTEINUR", "UROBILINOGEN", "NITRITE", "LEUKOCYTESUR" Sepsis Labs Recent Labs  Lab 07/01/22 1511 07/02/22 0310 07/03/22 0842 07/04/22 0343  WBC 5.7 4.6 5.5 5.5   Microbiology No results found for this or any previous visit (from the past 240 hour(s)).   Time coordinating discharge: Over 30 minutes  SIGNED:   Darliss Cheney, MD  [facility] 07/04/2022, 3:26  PM *Please note that this is a verbal dictation therefore any spelling or grammatical errors are due to the "[employer]" system interpretation. If 7PM-7AM, please contact night-coverage [email or web address]

## 2022-07-04 NOTE — Progress Notes (Signed)
Subjective:  PD done overnight-  removed one liter and also 1100 of UOP.  hgb dropped some- he is alert-  ambulated 200 feet with rolling walker - said his apart ment is flooding and he may need to leave   Objective Vital signs in last 24 hours: Vitals:   07/03/22 2015 07/04/22 0500 07/04/22 0503 07/04/22 0851  BP:   125/84 119/85  Pulse:   82 80  Resp:    17  Temp:   98.3 F (36.8 C) 98.7 F (37.1 C)  TempSrc:   Oral Oral  SpO2:   98% 99%  Weight: 65.6 kg 65.6 kg    Height:       Weight change: -4.707 kg  Intake/Output Summary (Last 24 hours) at 07/04/2022 1156 Last data filed at 07/04/2022 2355 Gross per 24 hour  Intake --  Output 1013 ml  Net -1013 ml    Dialyzes at [employer]  -  says that he only does 3 exchanges in a 24 hour period - does not seem like a lot-   2L 2.5 % fluid- he does manual exchanges -  BUN over 100 so might not be that compliant.  He says they have been giving him iron and ESA at his kidney center    Assessment/Plan: 56 year old BM-  longstanding ESRD-   possibly not compliant-  presents with hip pain and x-rays c/w renal osteodystrophy and also hgb in the 5's  1 hip pain-  x-rays c/w severe renal osteodystrophy-  unfortunately this has probably been in the making for a decade and there will be no quick fix.  PTH of over 2000 2 years ago-  will continue sensipar-  does not know the binder he is on-  will start renvela  2 ESRD: on PD-  with BUN over 100 may not be that compliant-  will order PD at night-  increase the number of exchanges to 4-  tolerated well-  I dont think he needs as much UF as we are doing -  will change to all 1.5 % fluid 3 Hypertension: seems controlled-  does not seem overloaded-  says he makes urine-  always uses 2.5 % at home- did a combo of 1.5 and 2.5-  UF more than her needs-  will dec to 1.5%-  also on 3 BP meds but BP is really good-  not sure he needs this much BP meds-  will stop norvasc and  follow 4. Anemia of ESRD: has this  but also may have a GI source of bleed-  GI has seen planning an EGD and colon on Monday.  plavix on hold.  Gave  DDAVP for platelet dysfunction in the setting of uremia.  iron stores high-   givin ESA on 12/7  5. Metabolic Bone Disease: as above-  unlikely to find quick fix-  PT/pain control    Evo Aderman A Zaylah Blecha    Labs: Basic Metabolic Panel: Recent Labs  Lab 07/01/22 1511 07/02/22 0310  NA 136 132*  K 5.0 4.8  CL 108 104  CO2 12* 13*  GLUCOSE 98 92  BUN 111* 112*  CREATININE 19.02* 18.36*  CALCIUM 7.4* 6.9*  PHOS  --  8.6*   Liver Function Tests: Recent Labs  Lab 07/02/22 0310  ALBUMIN 2.7*   No results for input(s): "LIPASE", "AMYLASE" in the last 168 hours. No results for input(s): "AMMONIA" in the last 168 hours. CBC: Recent Labs  Lab 07/01/22 1511 07/02/22 0310 07/02/22 1221  07/03/22 0842 07/04/22 0343  WBC 5.7 4.6  --  5.5 5.5  NEUTROABS 3.9  --   --   --  3.7  HGB 5.2* 6.5* 8.1* 8.4* 7.7*  HCT 16.4* 19.4* 25.0* 25.1* 23.1*  MCV 95.9 91.9  --  90.6 90.6  PLT 174 155  --  166 152   Cardiac Enzymes: No results for input(s): "CKTOTAL", "CKMB", "CKMBINDEX", "TROPONINI" in the last 168 hours. CBG: No results for input(s): "GLUCAP" in the last 168 hours.  Iron Studies:  Recent Labs    07/03/22 0317  IRON 154  TIBC 175*  FERRITIN 1,019*   Studies/Results: No results found. Medications: Infusions:  dialysis solution 1.5% low-MG/low-CA     dialysis solution 2.5% low-MG/low-CA      Scheduled Medications:  amLODipine  5 mg Oral Daily   cinacalcet  60 mg Oral Q breakfast   gentamicin cream  1 Application Topical Daily   heparin  5,000 Units Subcutaneous Q8H   labetalol  200 mg Oral BID   lisinopril  30 mg Oral QHS   pravastatin  20 mg Oral q1800   sevelamer carbonate  1,600 mg Oral TID WC   sodium bicarbonate  650 mg Oral TID    have reviewed scheduled and prn medications.  Physical Exam: General:  alert, nad Heart: RRR Lungs: mostly  clear Abdomen: soft, non tender Extremities: really no edema  Dialysis Access: PD cath-  non tender    07/04/2022,11:56 AM  LOS: 1 day

## 2022-07-04 NOTE — Progress Notes (Signed)
PROGRESS NOTE    Derrick Moore  ZTI:[id number] DOB: 1966-02-17 DOA: 07/01/2022 PCP: [name], PA-C   Brief Narrative:  Derrick Moore is a 56 y.o. male with medical history significant of ESRD on peritoneal dialysis, chronic bilateral hip pain, anemia of chronic disease, hypertension who presents to the emergency room with complaints of ongoing bilateral hip pain for the last several months, worse recently.  His pain got worse after he bent over to pick up an object last week.  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   ED Course: Hemodynamically stable.  Hemoglobin 5.2 with baseline hemoglobin about 7.  Potassium is normal.  Skeletal survey including both hip x-ray, recent CT scan of the pelvis from 11/28 shows iliac crest healing fracture, multiple osteodystrophy lesions, no hip fracture.   Assessment & Plan:   Principal Problem:   Symptomatic anemia Active Problems:   Hypertension   ESRD on dialysis ([place])   Dyslipidemia   Sacroiliac joint dysfunction of right side   Ambulatory dysfunction   Acute blood loss anemia (ABLA)   Acute blood loss anemia  Symptomatic anemia/acute blood loss anemia//intermittent hematochezia: Patient is also having intermittent bloody stool.  He was asked to bring stool card which he never did.  Most likely a combination with ESRD and GI bleed.  Presented with hemoglobin of 5.2, received total 3 units PRBC, hemoglobin 7.7 today.  FOBT pending.  GI on board.  Scheduled for EGD and colonoscopy on Monday, once Plavix washout is completed Receives Epogen at his dialysis center.  Essential hypertension: Blood pressure within goal. -Continue home lisinopril and amlodipine   ESRD on dialysis ([place]) Significantly elevated BUN and creatinine.  Patient is doing peritoneal dialysis for the past many years.  Not sure about the compliance.  Nephrology on board.    Dyslipidemia -Continue home pravastatin   Ambulatory dysfunction -PT/OT evaluation   Sacroiliac joint dysfunction of right side Causing significant ambulatory dysfunction. Imaging consistent with renal osteodystrophy, patient has parathyroid levels above 2021.  Nephrology is on board. -Continue with Sensipar -Pain management -PT/OT evaluation  DVT prophylaxis: heparin injection 5,000 Units Start: 07/01/22 2200   Code Status: Full Code  Family Communication:  None present at bedside.  Plan of care discussed with patient in length and he/she verbalized understanding and agreed with it.  Status is: Inpatient Remains inpatient appropriate because: Scheduled for colonoscopy and EGD on Monday.     Estimated body mass index is 20.75 kg/m as calculated from the following:   Height as of this encounter: 5\' 10"  (1.778 m).   Weight as of this encounter: 65.6 kg.    Nutritional Assessment: Body mass index is 20.75 kg/m.[name] Seen by dietician.  I agree with the assessment and plan as outlined below: Nutrition Status:        . Skin Assessment: I have examined the patient's skin and I agree with the wound assessment as performed by the wound care RN as outlined below:    Consultants:  GI and nephrology  Procedures:  As above  Antimicrobials:  Anti-infectives (From admission, onward)    None         Subjective:  Patient seen and examined.  He has no complaints.  Objective: Vitals:   07/03/22 2015 07/04/22 0500 07/04/22 0503 07/04/22 0851  BP:   125/84 119/85  Pulse:   82 80  Resp:    17  Temp:   98.3  F (36.8 C) 98.7 F (37.1 C)  TempSrc:   Oral Oral  SpO2:   98% 99%  Weight: 65.6 kg 65.6 kg    Height:        Intake/Output Summary (Last 24 hours) at 07/04/2022 0926 Last data filed at 07/04/2022 0658 Gross per 24 hour  Intake 120 ml  Output 1013 ml  Net -893 ml    Filed Weights   07/03/22 0531 07/03/22 2015 07/04/22 0500  Weight: 70.3 kg 65.6 kg 65.6  kg    Examination:  General exam: Appears calm and comfortable  Respiratory system: Clear to auscultation. Respiratory effort normal. Cardiovascular system: S1 & S2 heard, RRR. No JVD, murmurs, rubs, gallops or clicks. No pedal edema. Gastrointestinal system: Abdomen is nondistended, soft and nontender. No organomegaly or masses felt. Normal bowel sounds heard. Central nervous system: Alert and oriented. No focal neurological deficits. Extremities: Symmetric 5 x 5 power. Skin: No rashes, lesions or ulcers.  Psychiatry: Judgement and insight appear normal. Mood & affect appropriate.   Data Reviewed: I have personally reviewed following labs and imaging studies  CBC: Recent Labs  Lab 07/01/22 1511 07/02/22 0310 07/02/22 1221 07/03/22 0842 07/04/22 0343  WBC 5.7 4.6  --  5.5 5.5  NEUTROABS 3.9  --   --   --  3.7  HGB 5.2* 6.5* 8.1* 8.4* 7.7*  HCT 16.4* 19.4* 25.0* 25.1* 23.1*  MCV 95.9 91.9  --  90.6 90.6  PLT 174 155  --  166 591    Basic Metabolic Panel: Recent Labs  Lab 07/01/22 1511 07/02/22 0310  NA 136 132*  K 5.0 4.8  CL 108 104  CO2 12* 13*  GLUCOSE 98 92  BUN 111* 112*  CREATININE 19.02* 18.36*  CALCIUM 7.4* 6.9*  PHOS  --  8.6*    GFR: Estimated Creatinine Clearance: 4.3 mL/min (A) (by C-G formula based on SCr of 18.36 mg/dL (H)). Liver Function Tests: Recent Labs  Lab 07/02/22 0310  ALBUMIN 2.7*    No results for input(s): "LIPASE", "AMYLASE" in the last 168 hours. No results for input(s): "AMMONIA" in the last 168 hours. Coagulation Profile: No results for input(s): "INR", "PROTIME" in the last 168 hours. Cardiac Enzymes: No results for input(s): "CKTOTAL", "CKMB", "CKMBINDEX", "TROPONINI" in the last 168 hours. BNP (last 3 results) No results for input(s): "PROBNP" in the last 8760 hours. HbA1C: No results for input(s): "HGBA1C" in the last 72 hours. CBG: No results for input(s): "GLUCAP" in the last 168 hours. Lipid Profile: No results  for input(s): "CHOL", "HDL", "LDLCALC", "TRIG", "CHOLHDL", "LDLDIRECT" in the last 72 hours. Thyroid Function Tests: No results for input(s): "TSH", "T4TOTAL", "FREET4", "T3FREE", "THYROIDAB" in the last 72 hours. Anemia Panel: Recent Labs    07/03/22 0317  FERRITIN 1,019*  TIBC 175*  IRON 154    Sepsis Labs: No results for input(s): "PROCALCITON", "LATICACIDVEN" in the last 168 hours.  No results found for this or any previous visit (from the past 240 hour(s)).   Radiology Studies: No results found.  Scheduled Meds:  amLODipine  5 mg Oral Daily   cinacalcet  60 mg Oral Q breakfast   gentamicin cream  1 Application Topical Daily   heparin  5,000 Units Subcutaneous Q8H   labetalol  200 mg Oral BID   lisinopril  30 mg Oral QHS   pravastatin  20 mg Oral q1800   sevelamer carbonate  1,600 mg Oral TID WC   sodium bicarbonate  650 mg Oral TID  Continuous Infusions:  dialysis solution 1.5% low-MG/low-CA     dialysis solution 2.5% low-MG/low-CA       LOS: 1 day   Darliss Cheney, MD [facility]  07/04/2022, 9:26 AM   *Please note that this is a verbal dictation therefore any spelling or grammatical errors are due to the "[place]" system interpretation.  Please page via [place] and do not message via secure chat for urgent patient care matters. Secure chat can be used for non urgent patient care matters.  How to contact the [facility] Attending or Consulting provider [place] or covering provider during after hours [place], for this patient?  Check the care team in [facility] and look for a) attending/consulting TRH provider listed and b) the [facility] team listed. Page or secure chat [phone]. Log into [email or web address] and use [place]'s universal password to access. If you do not have the password, please contact the hospital operator. Locate the [facility] provider you are looking for under [facility] and page to a number that you can be directly reached. If you still have  difficulty reaching the provider, please page the [facility] (Director on Call) for the Hospitalists listed on amion for assistance.

## 2022-07-04 NOTE — Procedures (Signed)
PD note  PD completed successfully.  Patient asleep when the writer entered the room and stated he slept well during treatment.  1013 ml UF removed with the treatment lasting 7 hours and 52 minutes.
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
## 2022-07-05 DIAGNOSIS — Z992 Dependence on renal dialysis: Secondary | ICD-10-CM | POA: Diagnosis not present

## 2022-07-05 DIAGNOSIS — N186 End stage renal disease: Secondary | ICD-10-CM | POA: Diagnosis not present

## 2022-07-06 DIAGNOSIS — Z992 Dependence on renal dialysis: Secondary | ICD-10-CM | POA: Diagnosis not present

## 2022-07-06 DIAGNOSIS — N186 End stage renal disease: Secondary | ICD-10-CM | POA: Diagnosis not present

## 2022-07-07 ENCOUNTER — Telehealth: Payer: Self-pay | Admitting: *Deleted

## 2022-07-07 ENCOUNTER — Encounter: Payer: Self-pay | Admitting: *Deleted

## 2022-07-07 DIAGNOSIS — Z992 Dependence on renal dialysis: Secondary | ICD-10-CM | POA: Diagnosis not present

## 2022-07-07 DIAGNOSIS — N186 End stage renal disease: Secondary | ICD-10-CM | POA: Diagnosis not present

## 2022-07-07 SURGERY — ESOPHAGOGASTRODUODENOSCOPY (EGD) WITH PROPOFOL
Anesthesia: Monitor Anesthesia Care

## 2022-07-07 NOTE — Patient Outreach (Signed)
  Care Coordination [facility] Note Transition Care Management Unsuccessful Follow-up Telephone Call  Date of discharge and from where:  Friday, 07/04/22, [facility]; symptomatic anemia/ GI bleeding in setting of ESRD- on peritoneal dialysis; patient departed hospital AMA  Attempts:  1st Attempt  Reason for unsuccessful TCM follow-up call:  Left voice message  Oneta Rack, RN, BSN, CCRN Alumnus RN CM Care Coordination/ Transition of [place] Management [phone]: direct office
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
## 2022-07-08 DIAGNOSIS — N186 End stage renal disease: Secondary | ICD-10-CM | POA: Diagnosis not present

## 2022-07-08 DIAGNOSIS — Z992 Dependence on renal dialysis: Secondary | ICD-10-CM | POA: Diagnosis not present

## 2022-07-09 DIAGNOSIS — Z992 Dependence on renal dialysis: Secondary | ICD-10-CM | POA: Diagnosis not present

## 2022-07-09 DIAGNOSIS — N186 End stage renal disease: Secondary | ICD-10-CM | POA: Diagnosis not present

## 2022-07-10 DIAGNOSIS — Z992 Dependence on renal dialysis: Secondary | ICD-10-CM | POA: Diagnosis not present

## 2022-07-10 DIAGNOSIS — N186 End stage renal disease: Secondary | ICD-10-CM | POA: Diagnosis not present

## 2022-07-11 DIAGNOSIS — N186 End stage renal disease: Secondary | ICD-10-CM | POA: Diagnosis not present

## 2022-07-11 DIAGNOSIS — Z992 Dependence on renal dialysis: Secondary | ICD-10-CM | POA: Diagnosis not present

## 2022-07-12 DIAGNOSIS — N186 End stage renal disease: Secondary | ICD-10-CM | POA: Diagnosis not present

## 2022-07-12 DIAGNOSIS — Z992 Dependence on renal dialysis: Secondary | ICD-10-CM | POA: Diagnosis not present

## 2022-07-13 DIAGNOSIS — N186 End stage renal disease: Secondary | ICD-10-CM | POA: Diagnosis not present

## 2022-07-13 DIAGNOSIS — Z992 Dependence on renal dialysis: Secondary | ICD-10-CM | POA: Diagnosis not present

## 2022-07-14 DIAGNOSIS — Z992 Dependence on renal dialysis: Secondary | ICD-10-CM | POA: Diagnosis not present

## 2022-07-14 DIAGNOSIS — N186 End stage renal disease: Secondary | ICD-10-CM | POA: Diagnosis not present

## 2022-07-15 DIAGNOSIS — N186 End stage renal disease: Secondary | ICD-10-CM | POA: Diagnosis not present

## 2022-07-15 DIAGNOSIS — Z992 Dependence on renal dialysis: Secondary | ICD-10-CM | POA: Diagnosis not present

## 2022-07-16 ENCOUNTER — Ambulatory Visit: Payer: Medicare HMO | Admitting: Family Medicine

## 2022-07-16 DIAGNOSIS — N186 End stage renal disease: Secondary | ICD-10-CM | POA: Diagnosis not present

## 2022-07-16 DIAGNOSIS — Z992 Dependence on renal dialysis: Secondary | ICD-10-CM | POA: Diagnosis not present

## 2022-07-17 ENCOUNTER — Telehealth: Payer: Self-pay

## 2022-07-17 ENCOUNTER — Ambulatory Visit (INDEPENDENT_AMBULATORY_CARE_PROVIDER_SITE_OTHER): Payer: Medicare HMO | Admitting: Family Medicine

## 2022-07-17 ENCOUNTER — Encounter: Payer: Self-pay | Admitting: Family Medicine

## 2022-07-17 VITALS — BP 134/88 | Ht 70.0 in | Wt 155.0 lb

## 2022-07-17 DIAGNOSIS — S32302A Unspecified fracture of left ilium, initial encounter for closed fracture: Secondary | ICD-10-CM | POA: Diagnosis not present

## 2022-07-17 DIAGNOSIS — M461 Sacroiliitis, not elsewhere classified: Secondary | ICD-10-CM | POA: Diagnosis not present

## 2022-07-17 DIAGNOSIS — N186 End stage renal disease: Secondary | ICD-10-CM

## 2022-07-17 DIAGNOSIS — M87052 Idiopathic aseptic necrosis of left femur: Secondary | ICD-10-CM | POA: Diagnosis not present

## 2022-07-17 DIAGNOSIS — M1610 Unilateral primary osteoarthritis, unspecified hip: Secondary | ICD-10-CM

## 2022-07-17 DIAGNOSIS — N2581 Secondary hyperparathyroidism of renal origin: Secondary | ICD-10-CM

## 2022-07-17 DIAGNOSIS — Z992 Dependence on renal dialysis: Secondary | ICD-10-CM | POA: Diagnosis not present

## 2022-07-17 MED ORDER — HYDROCODONE-ACETAMINOPHEN 5-325 MG PO TABS: 1.0000 | ORAL_TABLET | Freq: Three times a day (TID) | ORAL | 0 refills | Status: DC | PRN

## 2022-07-17 NOTE — Telephone Encounter (Signed)
Called patient in regards to his prescription being ready for pick-up, in [facility]'s drawer. Patient stated he would be by today or tomorrow to pick it up.

## 2022-07-17 NOTE — Patient Instructions (Signed)
Good to see you Please try to get the brace  Please use the pain medicine as needed  We'll call with the results.  Please also follow up with your primary care and kidney doctor   Please send me a message in [place] with any questions or updates.  Please see me back in 2-3 weeks.   --Dr. Raeford Razor

## 2022-07-17 NOTE — Assessment & Plan Note (Addendum)
Appreciated on most recent imaging.  He did have a positive ANA but his changes are likely related to his renal osteodystrophy.

## 2022-07-17 NOTE — Assessment & Plan Note (Signed)
Acutely occurring.  Has good range of motion on exam.  His pain is less in the groin and more laterally.  He does have the iliac fracture on the ipsilateral side. -Counseled on home exercise therapy and supportive care. -Could consider further imaging or injection.

## 2022-07-17 NOTE — Assessment & Plan Note (Addendum)
Acute on chronic in nature.  He was started on medications while he was admitted recently.  Unsure of his follow-up with his nephrologist.  He reports not having any medications at this time. -Counseled on supportive care -Check phosphorus, magnesium, PTH and calcium vitamin D and CBC -Check bone density

## 2022-07-17 NOTE — Assessment & Plan Note (Addendum)
Acute on chronic in nature.  Has ongoing anemia and calcific changes -Counseled on supportive care. -Check bone density in light of end-stage renal disease and recent fractures of no trauma. -Advised to have close follow-up with his nephrologist.

## 2022-07-17 NOTE — Assessment & Plan Note (Signed)
Acute on chronic in nature.  Appreciated on most recent CT scan.  Associated with his renal osteodystrophy. -Counseled on home exercise therapy and supportive care. -[facility] prescription for brace.
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
## 2022-07-17 NOTE — Assessment & Plan Note (Addendum)
Acutely occurring.  Less pain on exam but has trouble with flexion of the hip.  Denies any trauma as to the source.  Has other avulsions appreciated within the pelvis -Counseled on home exercise therapy and supportive care. -Counseled on brace. -Norco.

## 2022-07-18 DIAGNOSIS — Z992 Dependence on renal dialysis: Secondary | ICD-10-CM | POA: Diagnosis not present

## 2022-07-18 DIAGNOSIS — N186 End stage renal disease: Secondary | ICD-10-CM | POA: Diagnosis not present

## 2022-07-18 LAB — CBC
Hematocrit: 29.6 % — ABNORMAL LOW (ref 37.5–51.0)
Hemoglobin: 9.7 g/dL — ABNORMAL LOW (ref 13.0–17.7)
MCH: 30.8 pg (ref 26.6–33.0)
MCHC: 32.8 g/dL (ref 31.5–35.7)
MCV: 94 fL (ref 79–97)
Platelets: 271 10*3/uL (ref 150–450)
RBC: 3.15 x10E6/uL — ABNORMAL LOW (ref 4.14–5.80)
RDW: 15.9 % — ABNORMAL HIGH (ref 11.6–15.4)
WBC: 5.5 10*3/uL (ref 3.4–10.8)

## 2022-07-18 LAB — PTH, INTACT AND CALCIUM
Calcium: 8.1 mg/dL — ABNORMAL LOW (ref 8.7–10.2)
PTH: 2177 pg/mL — ABNORMAL HIGH (ref 15–65)

## 2022-07-18 LAB — VITAMIN D 25 HYDROXY (VIT D DEFICIENCY, FRACTURES): Vit D, 25-Hydroxy: 5.7 ng/mL — ABNORMAL LOW (ref 30.0–100.0)

## 2022-07-18 LAB — PHOSPHORUS: Phosphorus: 7.4 mg/dL — ABNORMAL HIGH (ref 2.8–4.1)

## 2022-07-18 LAB — MAGNESIUM: Magnesium: 1.7 mg/dL (ref 1.6–2.3)

## 2022-07-19 DIAGNOSIS — N186 End stage renal disease: Secondary | ICD-10-CM | POA: Diagnosis not present

## 2022-07-19 DIAGNOSIS — Z992 Dependence on renal dialysis: Secondary | ICD-10-CM | POA: Diagnosis not present

## 2022-07-20 DIAGNOSIS — N186 End stage renal disease: Secondary | ICD-10-CM | POA: Diagnosis not present

## 2022-07-20 DIAGNOSIS — Z992 Dependence on renal dialysis: Secondary | ICD-10-CM | POA: Diagnosis not present

## 2022-07-21 DIAGNOSIS — Z992 Dependence on renal dialysis: Secondary | ICD-10-CM | POA: Diagnosis not present

## 2022-07-21 DIAGNOSIS — N186 End stage renal disease: Secondary | ICD-10-CM | POA: Diagnosis not present

## 2022-07-22 DIAGNOSIS — Z992 Dependence on renal dialysis: Secondary | ICD-10-CM | POA: Diagnosis not present

## 2022-07-22 DIAGNOSIS — N186 End stage renal disease: Secondary | ICD-10-CM | POA: Diagnosis not present

## 2022-07-23 DIAGNOSIS — N186 End stage renal disease: Secondary | ICD-10-CM | POA: Diagnosis not present

## 2022-07-23 DIAGNOSIS — Z992 Dependence on renal dialysis: Secondary | ICD-10-CM | POA: Diagnosis not present

## 2022-07-24 DIAGNOSIS — N186 End stage renal disease: Secondary | ICD-10-CM | POA: Diagnosis not present

## 2022-07-24 DIAGNOSIS — Z992 Dependence on renal dialysis: Secondary | ICD-10-CM | POA: Diagnosis not present

## 2022-07-25 DIAGNOSIS — N186 End stage renal disease: Secondary | ICD-10-CM | POA: Diagnosis not present

## 2022-07-25 DIAGNOSIS — Z992 Dependence on renal dialysis: Secondary | ICD-10-CM | POA: Diagnosis not present

## 2022-07-26 DIAGNOSIS — N186 End stage renal disease: Secondary | ICD-10-CM | POA: Diagnosis not present

## 2022-07-26 DIAGNOSIS — Z992 Dependence on renal dialysis: Secondary | ICD-10-CM | POA: Diagnosis not present

## 2022-07-27 DIAGNOSIS — Z992 Dependence on renal dialysis: Secondary | ICD-10-CM | POA: Diagnosis not present

## 2022-07-27 DIAGNOSIS — N186 End stage renal disease: Secondary | ICD-10-CM | POA: Diagnosis not present

## 2022-07-28 DIAGNOSIS — Z992 Dependence on renal dialysis: Secondary | ICD-10-CM | POA: Diagnosis not present

## 2022-07-28 DIAGNOSIS — N186 End stage renal disease: Secondary | ICD-10-CM | POA: Diagnosis not present

## 2022-07-29 ENCOUNTER — Encounter (HOSPITAL_BASED_OUTPATIENT_CLINIC_OR_DEPARTMENT_OTHER): Payer: Self-pay | Admitting: Family Medicine

## 2022-07-29 ENCOUNTER — Ambulatory Visit (HOSPITAL_BASED_OUTPATIENT_CLINIC_OR_DEPARTMENT_OTHER)
Admission: RE | Admit: 2022-07-29 | Discharge: 2022-07-29 | Disposition: A | Discharge status: Home / Self Care | Payer: Medicare HMO | Source: Ambulatory Visit | Attending: Family Medicine | Admitting: Family Medicine

## 2022-07-29 DIAGNOSIS — S32302A Unspecified fracture of left ilium, initial encounter for closed fracture: Secondary | ICD-10-CM | POA: Diagnosis not present

## 2022-07-29 DIAGNOSIS — N186 End stage renal disease: Secondary | ICD-10-CM | POA: Diagnosis not present

## 2022-07-29 DIAGNOSIS — Z992 Dependence on renal dialysis: Secondary | ICD-10-CM | POA: Diagnosis not present

## 2022-07-29 DIAGNOSIS — N2581 Secondary hyperparathyroidism of renal origin: Secondary | ICD-10-CM | POA: Diagnosis not present

## 2022-07-29 DIAGNOSIS — M87052 Idiopathic aseptic necrosis of left femur: Secondary | ICD-10-CM | POA: Insufficient documentation

## 2022-07-29 DIAGNOSIS — Z13820 Encounter for screening for osteoporosis: Secondary | ICD-10-CM | POA: Insufficient documentation

## 2022-07-29 DIAGNOSIS — M810 Age-related osteoporosis without current pathological fracture: Secondary | ICD-10-CM | POA: Diagnosis not present

## 2022-07-30 DIAGNOSIS — N186 End stage renal disease: Secondary | ICD-10-CM | POA: Diagnosis not present

## 2022-07-30 DIAGNOSIS — Z992 Dependence on renal dialysis: Secondary | ICD-10-CM | POA: Diagnosis not present

## 2022-07-31 ENCOUNTER — Other Ambulatory Visit: Payer: Self-pay | Admitting: Family Medicine

## 2022-07-31 ENCOUNTER — Telehealth: Payer: Self-pay | Admitting: Family Medicine

## 2022-07-31 ENCOUNTER — Ambulatory Visit: Payer: Medicare HMO | Admitting: Family Medicine

## 2022-07-31 DIAGNOSIS — S32302A Unspecified fracture of left ilium, initial encounter for closed fracture: Secondary | ICD-10-CM

## 2022-07-31 DIAGNOSIS — M87052 Idiopathic aseptic necrosis of left femur: Secondary | ICD-10-CM

## 2022-07-31 DIAGNOSIS — N186 End stage renal disease: Secondary | ICD-10-CM | POA: Diagnosis not present

## 2022-07-31 DIAGNOSIS — Z992 Dependence on renal dialysis: Secondary | ICD-10-CM | POA: Diagnosis not present

## 2022-07-31 MED ORDER — HYDROCODONE-ACETAMINOPHEN 5-325 MG PO TABS: 1.0000 | ORAL_TABLET | Freq: Three times a day (TID) | ORAL | 0 refills | Status: DC | PRN

## 2022-07-31 NOTE — Telephone Encounter (Signed)
Informed of results.  Having significant elevation of his parathyroid hormone and low vitamin D.  Continues to have pain.  Advised to be seen by his nephrologist sooner than later in order to get these corrected.  His bone density was revealing for osteoporosis.  This is in the setting of a pelvic fracture. Will send in pain medicine.   Rosemarie Ax, MD [facility] 07/31/2022, 8:32 AM

## 2022-08-01 DIAGNOSIS — N186 End stage renal disease: Secondary | ICD-10-CM | POA: Diagnosis not present

## 2022-08-01 DIAGNOSIS — Z992 Dependence on renal dialysis: Secondary | ICD-10-CM | POA: Diagnosis not present

## 2022-08-02 DIAGNOSIS — Z992 Dependence on renal dialysis: Secondary | ICD-10-CM | POA: Diagnosis not present

## 2022-08-02 DIAGNOSIS — N186 End stage renal disease: Secondary | ICD-10-CM | POA: Diagnosis not present

## 2022-08-03 DIAGNOSIS — Z992 Dependence on renal dialysis: Secondary | ICD-10-CM | POA: Diagnosis not present

## 2022-08-03 DIAGNOSIS — N186 End stage renal disease: Secondary | ICD-10-CM | POA: Diagnosis not present

## 2022-08-04 DIAGNOSIS — N186 End stage renal disease: Secondary | ICD-10-CM | POA: Diagnosis not present

## 2022-08-04 DIAGNOSIS — Z992 Dependence on renal dialysis: Secondary | ICD-10-CM | POA: Diagnosis not present

## 2022-08-05 DIAGNOSIS — Z992 Dependence on renal dialysis: Secondary | ICD-10-CM | POA: Diagnosis not present

## 2022-08-05 DIAGNOSIS — N186 End stage renal disease: Secondary | ICD-10-CM | POA: Diagnosis not present

## 2022-08-06 DIAGNOSIS — Z992 Dependence on renal dialysis: Secondary | ICD-10-CM | POA: Diagnosis not present

## 2022-08-06 DIAGNOSIS — N186 End stage renal disease: Secondary | ICD-10-CM | POA: Diagnosis not present

## 2022-08-07 DIAGNOSIS — N186 End stage renal disease: Secondary | ICD-10-CM | POA: Diagnosis not present

## 2022-08-07 DIAGNOSIS — Z992 Dependence on renal dialysis: Secondary | ICD-10-CM | POA: Diagnosis not present

## 2022-08-08 DIAGNOSIS — Z992 Dependence on renal dialysis: Secondary | ICD-10-CM | POA: Diagnosis not present

## 2022-08-08 DIAGNOSIS — N186 End stage renal disease: Secondary | ICD-10-CM | POA: Diagnosis not present

## 2022-08-09 DIAGNOSIS — N186 End stage renal disease: Secondary | ICD-10-CM | POA: Diagnosis not present

## 2022-08-09 DIAGNOSIS — Z992 Dependence on renal dialysis: Secondary | ICD-10-CM | POA: Diagnosis not present

## 2022-08-10 DIAGNOSIS — Z992 Dependence on renal dialysis: Secondary | ICD-10-CM | POA: Diagnosis not present

## 2022-08-10 DIAGNOSIS — N186 End stage renal disease: Secondary | ICD-10-CM | POA: Diagnosis not present

## 2022-08-11 DIAGNOSIS — Z992 Dependence on renal dialysis: Secondary | ICD-10-CM | POA: Diagnosis not present

## 2022-08-11 DIAGNOSIS — N186 End stage renal disease: Secondary | ICD-10-CM | POA: Diagnosis not present

## 2022-08-12 DIAGNOSIS — N186 End stage renal disease: Secondary | ICD-10-CM | POA: Diagnosis not present

## 2022-08-12 DIAGNOSIS — Z992 Dependence on renal dialysis: Secondary | ICD-10-CM | POA: Diagnosis not present

## 2022-08-13 DIAGNOSIS — N186 End stage renal disease: Secondary | ICD-10-CM | POA: Diagnosis not present

## 2022-08-13 DIAGNOSIS — Z992 Dependence on renal dialysis: Secondary | ICD-10-CM | POA: Diagnosis not present

## 2022-08-14 DIAGNOSIS — Z992 Dependence on renal dialysis: Secondary | ICD-10-CM | POA: Diagnosis not present

## 2022-08-14 DIAGNOSIS — N186 End stage renal disease: Secondary | ICD-10-CM | POA: Diagnosis not present

## 2022-08-15 DIAGNOSIS — N186 End stage renal disease: Secondary | ICD-10-CM | POA: Diagnosis not present

## 2022-08-15 DIAGNOSIS — Z992 Dependence on renal dialysis: Secondary | ICD-10-CM | POA: Diagnosis not present

## 2022-08-16 DIAGNOSIS — Z992 Dependence on renal dialysis: Secondary | ICD-10-CM | POA: Diagnosis not present

## 2022-08-16 DIAGNOSIS — N186 End stage renal disease: Secondary | ICD-10-CM | POA: Diagnosis not present

## 2022-08-17 DIAGNOSIS — Z992 Dependence on renal dialysis: Secondary | ICD-10-CM | POA: Diagnosis not present

## 2022-08-17 DIAGNOSIS — N186 End stage renal disease: Secondary | ICD-10-CM | POA: Diagnosis not present

## 2022-08-18 DIAGNOSIS — Z992 Dependence on renal dialysis: Secondary | ICD-10-CM | POA: Diagnosis not present

## 2022-08-18 DIAGNOSIS — N186 End stage renal disease: Secondary | ICD-10-CM | POA: Diagnosis not present

## 2022-08-19 ENCOUNTER — Other Ambulatory Visit: Payer: Self-pay | Admitting: Family Medicine

## 2022-08-19 DIAGNOSIS — Z992 Dependence on renal dialysis: Secondary | ICD-10-CM | POA: Diagnosis not present

## 2022-08-19 DIAGNOSIS — N186 End stage renal disease: Secondary | ICD-10-CM | POA: Diagnosis not present

## 2022-08-19 DIAGNOSIS — S32302A Unspecified fracture of left ilium, initial encounter for closed fracture: Secondary | ICD-10-CM

## 2022-08-19 DIAGNOSIS — M87052 Idiopathic aseptic necrosis of left femur: Secondary | ICD-10-CM

## 2022-08-19 MED ORDER — HYDROCODONE-ACETAMINOPHEN 5-325 MG PO TABS: 1.0000 | ORAL_TABLET | Freq: Three times a day (TID) | ORAL | 0 refills | Status: DC | PRN

## 2022-08-19 NOTE — Telephone Encounter (Signed)
Refilled norco.   Rosemarie Ax, MD [facility] 08/19/2022, 8:45 AM

## 2022-08-20 DIAGNOSIS — Z992 Dependence on renal dialysis: Secondary | ICD-10-CM | POA: Diagnosis not present

## 2022-08-20 DIAGNOSIS — N186 End stage renal disease: Secondary | ICD-10-CM | POA: Diagnosis not present

## 2022-08-21 DIAGNOSIS — Z992 Dependence on renal dialysis: Secondary | ICD-10-CM | POA: Diagnosis not present

## 2022-08-21 DIAGNOSIS — N186 End stage renal disease: Secondary | ICD-10-CM | POA: Diagnosis not present

## 2022-08-22 DIAGNOSIS — N186 End stage renal disease: Secondary | ICD-10-CM | POA: Diagnosis not present

## 2022-08-22 DIAGNOSIS — Z992 Dependence on renal dialysis: Secondary | ICD-10-CM | POA: Diagnosis not present

## 2022-08-23 DIAGNOSIS — N186 End stage renal disease: Secondary | ICD-10-CM | POA: Diagnosis not present

## 2022-08-23 DIAGNOSIS — Z992 Dependence on renal dialysis: Secondary | ICD-10-CM | POA: Diagnosis not present

## 2022-08-24 DIAGNOSIS — N186 End stage renal disease: Secondary | ICD-10-CM | POA: Diagnosis not present

## 2022-08-24 DIAGNOSIS — Z992 Dependence on renal dialysis: Secondary | ICD-10-CM | POA: Diagnosis not present

## 2022-08-25 DIAGNOSIS — Z992 Dependence on renal dialysis: Secondary | ICD-10-CM | POA: Diagnosis not present

## 2022-08-25 DIAGNOSIS — N186 End stage renal disease: Secondary | ICD-10-CM | POA: Diagnosis not present

## 2022-08-26 DIAGNOSIS — Z992 Dependence on renal dialysis: Secondary | ICD-10-CM | POA: Diagnosis not present

## 2022-08-26 DIAGNOSIS — N186 End stage renal disease: Secondary | ICD-10-CM | POA: Diagnosis not present

## 2022-08-27 DIAGNOSIS — Z992 Dependence on renal dialysis: Secondary | ICD-10-CM | POA: Diagnosis not present

## 2022-08-27 DIAGNOSIS — N186 End stage renal disease: Secondary | ICD-10-CM | POA: Diagnosis not present

## 2022-08-28 ENCOUNTER — Encounter: Payer: Self-pay | Admitting: Family Medicine

## 2022-08-28 ENCOUNTER — Ambulatory Visit (INDEPENDENT_AMBULATORY_CARE_PROVIDER_SITE_OTHER): Payer: Medicare HMO | Admitting: Family Medicine

## 2022-08-28 VITALS — BP 124/86 | Ht 70.0 in | Wt 155.0 lb

## 2022-08-28 DIAGNOSIS — N186 End stage renal disease: Secondary | ICD-10-CM | POA: Diagnosis not present

## 2022-08-28 DIAGNOSIS — S32302G Unspecified fracture of left ilium, subsequent encounter for fracture with delayed healing: Secondary | ICD-10-CM | POA: Diagnosis not present

## 2022-08-28 DIAGNOSIS — Z992 Dependence on renal dialysis: Secondary | ICD-10-CM | POA: Diagnosis not present

## 2022-08-28 NOTE — Progress Notes (Signed)
  Derrick Moore - 57 y.o. male MRN [id number]  Date of birth: Nov 30, 1965  SUBJECTIVE:  Including CC & ROS.  No chief complaint on file.   Derrick Moore is a 57 y.o. male that is  following up for his pelvis fracture. He has been able to stop using the cane. He was admitted in December when his fracture was found. Continues with his dialysis.    Review of Systems See HPI   HISTORY: Past Medical, Surgical, Social, and Family History Reviewed & Updated per EMR.   Pertinent Historical Findings include:  Past Medical History:  Diagnosis Date   Dialysis patient [facility])    Hyperlipidemia    Hypertension    Renal failure    Renal failure    Stroke [facility])     History reviewed. No pertinent surgical history.   PHYSICAL EXAM:  VS: BP 124/86 (BP Location: Left Arm, Patient Position: Sitting)   Ht 5\' 10"  (1.778 m)   Wt 155 lb (70.3 kg)   BMI 22.24 kg/m  Physical Exam Gen: NAD, alert, cooperative with exam, well-appearing MSK:  Neurovascularly intact       ASSESSMENT & PLAN:   Closed fracture of left iliac wing (HCC) Acute on chronic in nature. Continues to get improvement and has been able to stop using the cane.  - counseled on home exercise therapy and supportive care - completed paperwork  - f/u in 4 weeks and can consider re-imaging.

## 2022-08-28 NOTE — Patient Instructions (Signed)
Good to see you Please continue following up with the kidney doctor  Please send me a message in Derrick Moore with any questions or updates.  Please see me back in 4 weeks.   --Dr. Raeford Razor

## 2022-08-28 NOTE — Assessment & Plan Note (Signed)
Acute on chronic in nature. Continues to get improvement and has been able to stop using the cane.  - counseled on home exercise therapy and supportive care - completed paperwork  - f/u in 4 weeks and can consider re-imaging.

## 2022-08-29 DIAGNOSIS — Z992 Dependence on renal dialysis: Secondary | ICD-10-CM | POA: Diagnosis not present

## 2022-08-29 DIAGNOSIS — N186 End stage renal disease: Secondary | ICD-10-CM | POA: Diagnosis not present

## 2022-08-30 DIAGNOSIS — N186 End stage renal disease: Secondary | ICD-10-CM | POA: Diagnosis not present

## 2022-08-30 DIAGNOSIS — Z992 Dependence on renal dialysis: Secondary | ICD-10-CM | POA: Diagnosis not present

## 2022-08-31 DIAGNOSIS — N186 End stage renal disease: Secondary | ICD-10-CM | POA: Diagnosis not present

## 2022-08-31 DIAGNOSIS — Z992 Dependence on renal dialysis: Secondary | ICD-10-CM | POA: Diagnosis not present

## 2022-09-01 DIAGNOSIS — Z992 Dependence on renal dialysis: Secondary | ICD-10-CM | POA: Diagnosis not present

## 2022-09-01 DIAGNOSIS — N186 End stage renal disease: Secondary | ICD-10-CM | POA: Diagnosis not present

## 2022-09-02 DIAGNOSIS — Z992 Dependence on renal dialysis: Secondary | ICD-10-CM | POA: Diagnosis not present

## 2022-09-02 DIAGNOSIS — N186 End stage renal disease: Secondary | ICD-10-CM | POA: Diagnosis not present

## 2022-09-03 DIAGNOSIS — Z992 Dependence on renal dialysis: Secondary | ICD-10-CM | POA: Diagnosis not present

## 2022-09-03 DIAGNOSIS — N186 End stage renal disease: Secondary | ICD-10-CM | POA: Diagnosis not present

## 2022-09-04 DIAGNOSIS — Z992 Dependence on renal dialysis: Secondary | ICD-10-CM | POA: Diagnosis not present

## 2022-09-04 DIAGNOSIS — N186 End stage renal disease: Secondary | ICD-10-CM | POA: Diagnosis not present

## 2022-09-05 DIAGNOSIS — Z992 Dependence on renal dialysis: Secondary | ICD-10-CM | POA: Diagnosis not present

## 2022-09-05 DIAGNOSIS — N186 End stage renal disease: Secondary | ICD-10-CM | POA: Diagnosis not present

## 2022-09-06 DIAGNOSIS — N186 End stage renal disease: Secondary | ICD-10-CM | POA: Diagnosis not present

## 2022-09-06 DIAGNOSIS — Z992 Dependence on renal dialysis: Secondary | ICD-10-CM | POA: Diagnosis not present

## 2022-09-07 DIAGNOSIS — N186 End stage renal disease: Secondary | ICD-10-CM | POA: Diagnosis not present

## 2022-09-07 DIAGNOSIS — Z992 Dependence on renal dialysis: Secondary | ICD-10-CM | POA: Diagnosis not present

## 2022-09-08 DIAGNOSIS — Z992 Dependence on renal dialysis: Secondary | ICD-10-CM | POA: Diagnosis not present

## 2022-09-08 DIAGNOSIS — N186 End stage renal disease: Secondary | ICD-10-CM | POA: Diagnosis not present

## 2022-09-09 DIAGNOSIS — Z992 Dependence on renal dialysis: Secondary | ICD-10-CM | POA: Diagnosis not present

## 2022-09-09 DIAGNOSIS — N186 End stage renal disease: Secondary | ICD-10-CM | POA: Diagnosis not present

## 2022-09-10 DIAGNOSIS — N186 End stage renal disease: Secondary | ICD-10-CM | POA: Diagnosis not present

## 2022-09-10 DIAGNOSIS — Z992 Dependence on renal dialysis: Secondary | ICD-10-CM | POA: Diagnosis not present

## 2022-09-11 DIAGNOSIS — N186 End stage renal disease: Secondary | ICD-10-CM | POA: Diagnosis not present

## 2022-09-11 DIAGNOSIS — Z992 Dependence on renal dialysis: Secondary | ICD-10-CM | POA: Diagnosis not present

## 2022-09-12 DIAGNOSIS — N186 End stage renal disease: Secondary | ICD-10-CM | POA: Diagnosis not present

## 2022-09-12 DIAGNOSIS — Z992 Dependence on renal dialysis: Secondary | ICD-10-CM | POA: Diagnosis not present

## 2022-09-13 DIAGNOSIS — Z992 Dependence on renal dialysis: Secondary | ICD-10-CM | POA: Diagnosis not present

## 2022-09-13 DIAGNOSIS — N186 End stage renal disease: Secondary | ICD-10-CM | POA: Diagnosis not present

## 2022-09-14 DIAGNOSIS — Z992 Dependence on renal dialysis: Secondary | ICD-10-CM | POA: Diagnosis not present

## 2022-09-14 DIAGNOSIS — N186 End stage renal disease: Secondary | ICD-10-CM | POA: Diagnosis not present

## 2022-09-15 DIAGNOSIS — Z992 Dependence on renal dialysis: Secondary | ICD-10-CM | POA: Diagnosis not present

## 2022-09-15 DIAGNOSIS — N186 End stage renal disease: Secondary | ICD-10-CM | POA: Diagnosis not present

## 2022-09-16 DIAGNOSIS — Z992 Dependence on renal dialysis: Secondary | ICD-10-CM | POA: Diagnosis not present

## 2022-09-16 DIAGNOSIS — N186 End stage renal disease: Secondary | ICD-10-CM | POA: Diagnosis not present

## 2022-09-17 DIAGNOSIS — N186 End stage renal disease: Secondary | ICD-10-CM | POA: Diagnosis not present

## 2022-09-17 DIAGNOSIS — Z992 Dependence on renal dialysis: Secondary | ICD-10-CM | POA: Diagnosis not present

## 2022-09-18 DIAGNOSIS — Z992 Dependence on renal dialysis: Secondary | ICD-10-CM | POA: Diagnosis not present

## 2022-09-18 DIAGNOSIS — N186 End stage renal disease: Secondary | ICD-10-CM | POA: Diagnosis not present

## 2022-09-19 DIAGNOSIS — Z992 Dependence on renal dialysis: Secondary | ICD-10-CM | POA: Diagnosis not present

## 2022-09-19 DIAGNOSIS — N186 End stage renal disease: Secondary | ICD-10-CM | POA: Diagnosis not present

## 2022-09-20 DIAGNOSIS — Z992 Dependence on renal dialysis: Secondary | ICD-10-CM | POA: Diagnosis not present

## 2022-09-20 DIAGNOSIS — N186 End stage renal disease: Secondary | ICD-10-CM | POA: Diagnosis not present

## 2022-09-21 DIAGNOSIS — Z992 Dependence on renal dialysis: Secondary | ICD-10-CM | POA: Diagnosis not present

## 2022-09-21 DIAGNOSIS — N186 End stage renal disease: Secondary | ICD-10-CM | POA: Diagnosis not present

## 2022-09-22 ENCOUNTER — Other Ambulatory Visit (INDEPENDENT_AMBULATORY_CARE_PROVIDER_SITE_OTHER): Payer: Self-pay | Admitting: Nurse Practitioner

## 2022-09-22 DIAGNOSIS — N186 End stage renal disease: Secondary | ICD-10-CM

## 2022-09-22 DIAGNOSIS — Z992 Dependence on renal dialysis: Secondary | ICD-10-CM | POA: Diagnosis not present

## 2022-09-23 DIAGNOSIS — Z992 Dependence on renal dialysis: Secondary | ICD-10-CM | POA: Diagnosis not present

## 2022-09-23 DIAGNOSIS — N186 End stage renal disease: Secondary | ICD-10-CM | POA: Diagnosis not present

## 2022-09-24 DIAGNOSIS — Z992 Dependence on renal dialysis: Secondary | ICD-10-CM | POA: Diagnosis not present

## 2022-09-24 DIAGNOSIS — N186 End stage renal disease: Secondary | ICD-10-CM | POA: Diagnosis not present

## 2022-09-25 ENCOUNTER — Ambulatory Visit (INDEPENDENT_AMBULATORY_CARE_PROVIDER_SITE_OTHER): Payer: Medicare HMO | Admitting: Vascular Surgery

## 2022-09-25 ENCOUNTER — Ambulatory Visit (INDEPENDENT_AMBULATORY_CARE_PROVIDER_SITE_OTHER): Payer: Medicare HMO

## 2022-09-25 ENCOUNTER — Encounter (INDEPENDENT_AMBULATORY_CARE_PROVIDER_SITE_OTHER): Payer: Self-pay | Admitting: Vascular Surgery

## 2022-09-25 VITALS — BP 187/112 | HR 72 | Resp 16 | Wt 138.2 lb

## 2022-09-25 DIAGNOSIS — N186 End stage renal disease: Secondary | ICD-10-CM | POA: Diagnosis not present

## 2022-09-25 DIAGNOSIS — Z992 Dependence on renal dialysis: Secondary | ICD-10-CM

## 2022-09-25 DIAGNOSIS — E785 Hyperlipidemia, unspecified: Secondary | ICD-10-CM | POA: Diagnosis not present

## 2022-09-25 DIAGNOSIS — I10 Essential (primary) hypertension: Secondary | ICD-10-CM | POA: Diagnosis not present

## 2022-09-25 DIAGNOSIS — M5416 Radiculopathy, lumbar region: Secondary | ICD-10-CM

## 2022-09-25 DIAGNOSIS — N185 Chronic kidney disease, stage 5: Secondary | ICD-10-CM | POA: Insufficient documentation

## 2022-09-25 NOTE — Progress Notes (Addendum)
MRN : [id number]  Derrick Moore is a 57 y.o. (05-12-1966) male who presents with chief complaint of check access.  History of Present Illness:   The patient is seen for evaluation for dialysis access. The patient has end stage renal insufficiency hypertension. The patient's most recent creatinine clearance is less than 3. The patient volume status has not yet become an issue as he has been sustained on PD. Patient's blood pressures been relatively well controlled. There are mild uremic symptoms which appear to be relatively well tolerated at this time, secondary to poor function of his PD catheter.  The patient notes the kidney problem has been present for a long time and has been progressively getting worse.  The patient is followed by nephrology.    The patient is right-handed.  The patient has been considering the various methods of dialysis and wishes to transition to hemodialysis and therefore he needs creation of AV access.  No recent shortening of the patient's walking distance or new symptoms consistent with claudication.  No history of rest pain symptoms. No new ulcers or wounds of the lower extremities have occurred.  The patient denies amaurosis fugax or recent TIA symptoms. There are no recent neurological changes noted. There is no history of DVT, PE or superficial thrombophlebitis. No recent episodes of angina or shortness of breath documented.    Upper extremity noninvasive studies obtained today demonstrate normal arterial flow bilateral upper extremities.  Venous mapping shows very small 1 to 2 mm veins inadequate for fistula creation.  No outpatient medications have been marked as taking for the 09/25/22 encounter (Appointment) with Delana Meyer, Dolores Lory, MD.    Past Medical History:  Diagnosis Date   Dialysis patient [facility])    Hyperlipidemia    Hypertension    Renal failure    Renal failure    Stroke [facility])     No past surgical  history on file.  Social History Social History   Tobacco Use   Smoking status: Every Day    Packs/day: 0.25    Years: 33.00    Total pack years: 8.25    Types: Cigarettes   Smokeless tobacco: Never  Vaping Use   Vaping Use: Never used  Substance Use Topics   Alcohol use: Not Currently    Alcohol/week: 1.0 standard drink of alcohol    Types: 1 Cans of beer per week   Drug use: Yes    Types: Marijuana    Family History No family history on file.  No Known Allergies   REVIEW OF SYSTEMS (Negative unless checked)  Constitutional: '[]'$ Weight loss  '[]'$ Fever  '[]'$ Chills Cardiac: '[]'$ Chest pain   '[]'$ Chest pressure   '[]'$ Palpitations   '[]'$ Shortness of breath when laying flat   '[]'$ Shortness of breath with exertion. Vascular:  '[]'$ Pain in legs with walking   '[]'$ Pain in legs at rest  '[]'$ History of DVT   '[]'$ Phlebitis   '[]'$ Swelling in legs   '[]'$ Varicose veins   '[]'$ Non-healing ulcers Pulmonary:   '[]'$ Uses home oxygen   '[]'$ Productive cough   '[]'$ Hemoptysis   '[]'$ Wheeze  '[]'$ COPD   '[]'$ Asthma Neurologic:  '[]'$ Dizziness   '[]'$ Seizures   '[]'$ History of stroke   '[]'$ History of TIA  '[]'$ Aphasia   '[]'$ Vissual changes   '[]'$ Weakness or numbness in arm   '[]'$ Weakness or numbness in leg Musculoskeletal:   '[]'$ Joint swelling   '[]'$ Joint pain   '[]'$ Low  back pain Hematologic:  '[]'$ Easy bruising  '[]'$ Easy bleeding   '[]'$ Hypercoagulable state   '[]'$ Anemic Gastrointestinal:  '[]'$ Diarrhea   '[]'$ Vomiting  '[]'$ Gastroesophageal reflux/heartburn   '[]'$ Difficulty swallowing. Genitourinary:  '[x]'$ Chronic kidney disease   '[]'$ Difficult urination  '[]'$ Frequent urination   '[]'$ Blood in urine Skin:  '[]'$ Rashes   '[]'$ Ulcers  Psychological:  '[]'$ History of anxiety   '[]'$  History of major depression.  Physical Examination  There were no vitals filed for this visit. There is no height or weight on file to calculate BMI. Gen: WD/WN, NAD Head: [place]/AT, No temporalis wasting.  Ear/Nose/Throat: Hearing grossly intact, nares w/o erythema or drainage Eyes: PER, EOMI, sclera nonicteric.  Neck: Supple, no  gross masses or lesions.  No JVD.  Pulmonary:  Good air movement, no audible wheezing, no use of accessory muscles.  Cardiac: RRR, precordium non-hyperdynamic. Vascular:   PD catheter in place Vessel Right Left  Radial Palpable Palpable  Brachial Palpable Palpable  Gastrointestinal: soft, non-distended. No guarding/no peritoneal signs.  Musculoskeletal: M/S 5/5 throughout.  No deformity.  Neurologic: CN 2-12 intact. Pain and light touch intact in extremities.  Symmetrical.  Speech is fluent. Motor exam as listed above. Psychiatric: Judgment intact, Mood & affect appropriate for pt's clinical situation. Dermatologic: No rashes or ulcers noted.  No changes consistent with cellulitis.   CBC Lab Results  Component Value Date   WBC 5.5 07/17/2022   HGB 9.7 (L) 07/17/2022   HCT 29.6 (L) 07/17/2022   MCV 94 07/17/2022   PLT 271 07/17/2022    BMET    Component Value Date/Time   NA 132 (L) 07/02/2022 0310   NA 139 01/02/2022 1515   K 4.8 07/02/2022 0310   CL 104 07/02/2022 0310   CO2 13 (L) 07/02/2022 0310   GLUCOSE 92 07/02/2022 0310   BUN 112 (H) 07/02/2022 0310   BUN 89 (HH) 01/02/2022 1515   CREATININE 18.36 (H) 07/02/2022 0310   CALCIUM 8.1 (L) 07/17/2022 1122   GFRNONAA 3 (L) 07/02/2022 0310   CrCl cannot be calculated (Patient's most recent lab result is older than the maximum 21 days allowed.).  COAG Lab Results  Component Value Date   INR 1.3 (H) 02/22/2022    Radiology No results found.   Assessment/Plan 1. ESRD on dialysis [facility]) Recommend:  At this time the patient does not have appropriate extremity access for dialysis  Patient should have a left brachial axillary AV graft created.  As a secondary issue Dr. Candiss Norse has also requested a tunnel catheter.  Given that we are placing a graft and not a fistula and that the patient seems to be tolerating his current circumstance I wonder if we can get by long enough that a graft would be healed and we could avoid  the catheter altogether.  I will discuss this with Dr. Candiss Norse.  The risks, benefits and alternative therapies were reviewed in detail with the patient.  All questions were answered.  The patient agrees to proceed with surgery.  Addendum: After contacting Dr. Candiss Norse he responded saying the patient has begun doing his PD treatments again and no longer needs to be transitioned to hemodialysis at this time.  We will hold off on any graft creation.  However in the future should he need hemodialysis he clearly will need a left brachial axillary AV graft.  The patient will follow up with me in the office in 3 months.  2. Hypertension, unspecified type Continue antihypertensive medications as already ordered, these medications have been reviewed and there are  no changes at this time.  3. Lumbar radiculopathy Continue NSAID medications as already ordered, these medications have been reviewed and there are no changes at this time.  Continued activity and therapy was stressed.  4. Dyslipidemia Continue statin as ordered and reviewed, no changes at this time    Hortencia Pilar, MD  09/25/2022 12:59 PM

## 2022-09-26 ENCOUNTER — Ambulatory Visit (HOSPITAL_BASED_OUTPATIENT_CLINIC_OR_DEPARTMENT_OTHER)
Admission: RE | Admit: 2022-09-26 | Discharge: 2022-09-26 | Disposition: A | Discharge status: Home / Self Care | Payer: Medicare HMO | Source: Ambulatory Visit | Attending: Family Medicine | Admitting: Family Medicine

## 2022-09-26 ENCOUNTER — Encounter: Payer: Self-pay | Admitting: Family Medicine

## 2022-09-26 ENCOUNTER — Encounter (INDEPENDENT_AMBULATORY_CARE_PROVIDER_SITE_OTHER): Payer: Self-pay | Admitting: Vascular Surgery

## 2022-09-26 ENCOUNTER — Ambulatory Visit (INDEPENDENT_AMBULATORY_CARE_PROVIDER_SITE_OTHER): Payer: Medicare HMO | Admitting: Family Medicine

## 2022-09-26 VITALS — BP 128/72 | Ht 70.0 in | Wt 128.0 lb

## 2022-09-26 DIAGNOSIS — N186 End stage renal disease: Secondary | ICD-10-CM | POA: Diagnosis not present

## 2022-09-26 DIAGNOSIS — S3289XA Fracture of other parts of pelvis, initial encounter for closed fracture: Secondary | ICD-10-CM | POA: Diagnosis not present

## 2022-09-26 DIAGNOSIS — S32302G Unspecified fracture of left ilium, subsequent encounter for fracture with delayed healing: Secondary | ICD-10-CM

## 2022-09-26 DIAGNOSIS — M87052 Idiopathic aseptic necrosis of left femur: Secondary | ICD-10-CM

## 2022-09-26 DIAGNOSIS — Z992 Dependence on renal dialysis: Secondary | ICD-10-CM | POA: Diagnosis not present

## 2022-09-26 MED ORDER — HYDROCODONE-ACETAMINOPHEN 5-325 MG PO TABS: 1.0000 | ORAL_TABLET | Freq: Three times a day (TID) | ORAL | 0 refills | Status: DC | PRN

## 2022-09-26 NOTE — Patient Instructions (Signed)
Good to see you Please use heat on the area  You can consider the compression  We have sent a referral to physical therapy   We'll call with the results from today  Please send me a message in MyChart with any questions or updates.  Please see me back in 4 weeks.   --Dr. Raeford Razor

## 2022-09-26 NOTE — Assessment & Plan Note (Signed)
Reports no pain in the groin at this point. -X-ray

## 2022-09-26 NOTE — Progress Notes (Signed)
  Stphen Kruszynski Moore - 57 y.o. male MRN [id number]  Date of birth: Dec 20, 1965  SUBJECTIVE:  Including CC & ROS.  No chief complaint on file.   Derrick Moore is a 57 y.o. male that is following up for his left pelvic wing fracture.  He started getting pain in the lower back when he sits long or is more active.  The pain is intermittent in nature.  No radicular type pain..    Review of Systems See HPI   HISTORY: Past Medical, Surgical, Social, and Family History Reviewed & Updated per EMR.   Pertinent Historical Findings include:  Past Medical History:  Diagnosis Date   [facility])    Hyperlipidemia    Hypertension    Renal failure    Renal failure    Stroke [name])     History reviewed. No pertinent surgical history.   PHYSICAL EXAM:  VS: BP 128/72   Ht '5\' 10"'$  (1.778 m)   Wt 128 lb (58.1 kg)   BMI 18.37 kg/m  Physical Exam Gen: NAD, alert, cooperative with exam, well-appearing MSK:  Neurovascularly intact       ASSESSMENT & PLAN:   Closed fracture of left iliac wing ([name]) Initially occurred around 12/5.  Having some pain in the lower part of the back. -Counseled on home exercise therapy and supportive care. -[place]. -X-ray. - Referral to physical therapy.   Avascular necrosis of femoral head, left (HCC) Reports no pain in the groin at this point. -X-ray

## 2022-09-26 NOTE — Assessment & Plan Note (Signed)
Initially occurred around 12/5.  Having some pain in the lower part of the back. -Counseled on home exercise therapy and supportive care. -[place]co. -X-ray. - Referral to physical therapy.

## 2022-09-27 DIAGNOSIS — Z992 Dependence on renal dialysis: Secondary | ICD-10-CM | POA: Diagnosis not present

## 2022-09-27 DIAGNOSIS — N186 End stage renal disease: Secondary | ICD-10-CM | POA: Diagnosis not present

## 2022-09-28 DIAGNOSIS — N186 End stage renal disease: Secondary | ICD-10-CM | POA: Diagnosis not present

## 2022-09-28 DIAGNOSIS — Z992 Dependence on renal dialysis: Secondary | ICD-10-CM | POA: Diagnosis not present

## 2022-09-29 DIAGNOSIS — N186 End stage renal disease: Secondary | ICD-10-CM | POA: Diagnosis not present

## 2022-09-29 DIAGNOSIS — Z992 Dependence on renal dialysis: Secondary | ICD-10-CM | POA: Diagnosis not present

## 2022-09-30 ENCOUNTER — Telehealth: Payer: Self-pay | Admitting: Family Medicine

## 2022-09-30 DIAGNOSIS — Z992 Dependence on renal dialysis: Secondary | ICD-10-CM | POA: Diagnosis not present

## 2022-09-30 DIAGNOSIS — N186 End stage renal disease: Secondary | ICD-10-CM | POA: Diagnosis not present

## 2022-09-30 NOTE — Telephone Encounter (Signed)
Informed on results.   Rosemarie Ax, MD [facility] 09/30/2022, 4:23 PM

## 2022-10-01 DIAGNOSIS — Z992 Dependence on renal dialysis: Secondary | ICD-10-CM | POA: Diagnosis not present

## 2022-10-01 DIAGNOSIS — N186 End stage renal disease: Secondary | ICD-10-CM | POA: Diagnosis not present

## 2022-10-02 DIAGNOSIS — N186 End stage renal disease: Secondary | ICD-10-CM | POA: Diagnosis not present

## 2022-10-02 DIAGNOSIS — Z992 Dependence on renal dialysis: Secondary | ICD-10-CM | POA: Diagnosis not present

## 2022-10-03 DIAGNOSIS — Z992 Dependence on renal dialysis: Secondary | ICD-10-CM | POA: Diagnosis not present

## 2022-10-03 DIAGNOSIS — N186 End stage renal disease: Secondary | ICD-10-CM | POA: Diagnosis not present

## 2022-10-04 DIAGNOSIS — N186 End stage renal disease: Secondary | ICD-10-CM | POA: Diagnosis not present

## 2022-10-04 DIAGNOSIS — Z992 Dependence on renal dialysis: Secondary | ICD-10-CM | POA: Diagnosis not present

## 2022-10-05 DIAGNOSIS — N186 End stage renal disease: Secondary | ICD-10-CM | POA: Diagnosis not present

## 2022-10-05 DIAGNOSIS — Z992 Dependence on renal dialysis: Secondary | ICD-10-CM | POA: Diagnosis not present

## 2022-10-06 DIAGNOSIS — N186 End stage renal disease: Secondary | ICD-10-CM | POA: Diagnosis not present

## 2022-10-06 DIAGNOSIS — Z992 Dependence on renal dialysis: Secondary | ICD-10-CM | POA: Diagnosis not present

## 2022-10-07 DIAGNOSIS — Z992 Dependence on renal dialysis: Secondary | ICD-10-CM | POA: Diagnosis not present

## 2022-10-07 DIAGNOSIS — N186 End stage renal disease: Secondary | ICD-10-CM | POA: Diagnosis not present

## 2022-10-08 DIAGNOSIS — N186 End stage renal disease: Secondary | ICD-10-CM | POA: Diagnosis not present

## 2022-10-08 DIAGNOSIS — Z992 Dependence on renal dialysis: Secondary | ICD-10-CM | POA: Diagnosis not present

## 2022-10-09 DIAGNOSIS — Z992 Dependence on renal dialysis: Secondary | ICD-10-CM | POA: Diagnosis not present

## 2022-10-09 DIAGNOSIS — N186 End stage renal disease: Secondary | ICD-10-CM | POA: Diagnosis not present

## 2022-10-10 DIAGNOSIS — N186 End stage renal disease: Secondary | ICD-10-CM | POA: Diagnosis not present

## 2022-10-10 DIAGNOSIS — Z992 Dependence on renal dialysis: Secondary | ICD-10-CM | POA: Diagnosis not present

## 2022-10-11 DIAGNOSIS — N186 End stage renal disease: Secondary | ICD-10-CM | POA: Diagnosis not present

## 2022-10-11 DIAGNOSIS — Z992 Dependence on renal dialysis: Secondary | ICD-10-CM | POA: Diagnosis not present

## 2022-10-12 DIAGNOSIS — Z992 Dependence on renal dialysis: Secondary | ICD-10-CM | POA: Diagnosis not present

## 2022-10-12 DIAGNOSIS — N186 End stage renal disease: Secondary | ICD-10-CM | POA: Diagnosis not present

## 2022-10-13 DIAGNOSIS — Z992 Dependence on renal dialysis: Secondary | ICD-10-CM | POA: Diagnosis not present

## 2022-10-13 DIAGNOSIS — N186 End stage renal disease: Secondary | ICD-10-CM | POA: Diagnosis not present

## 2022-10-14 DIAGNOSIS — Z992 Dependence on renal dialysis: Secondary | ICD-10-CM | POA: Diagnosis not present

## 2022-10-14 DIAGNOSIS — N186 End stage renal disease: Secondary | ICD-10-CM | POA: Diagnosis not present

## 2022-10-15 DIAGNOSIS — Z992 Dependence on renal dialysis: Secondary | ICD-10-CM | POA: Diagnosis not present

## 2022-10-15 DIAGNOSIS — N186 End stage renal disease: Secondary | ICD-10-CM | POA: Diagnosis not present

## 2022-10-16 DIAGNOSIS — N186 End stage renal disease: Secondary | ICD-10-CM | POA: Diagnosis not present

## 2022-10-16 DIAGNOSIS — Z992 Dependence on renal dialysis: Secondary | ICD-10-CM | POA: Diagnosis not present

## 2022-10-17 DIAGNOSIS — N186 End stage renal disease: Secondary | ICD-10-CM | POA: Diagnosis not present

## 2022-10-17 DIAGNOSIS — Z992 Dependence on renal dialysis: Secondary | ICD-10-CM | POA: Diagnosis not present

## 2022-10-18 DIAGNOSIS — N186 End stage renal disease: Secondary | ICD-10-CM | POA: Diagnosis not present

## 2022-10-18 DIAGNOSIS — Z992 Dependence on renal dialysis: Secondary | ICD-10-CM | POA: Diagnosis not present

## 2022-10-19 DIAGNOSIS — Z992 Dependence on renal dialysis: Secondary | ICD-10-CM | POA: Diagnosis not present

## 2022-10-19 DIAGNOSIS — N186 End stage renal disease: Secondary | ICD-10-CM | POA: Diagnosis not present

## 2022-10-20 DIAGNOSIS — N186 End stage renal disease: Secondary | ICD-10-CM | POA: Diagnosis not present

## 2022-10-20 DIAGNOSIS — Z992 Dependence on renal dialysis: Secondary | ICD-10-CM | POA: Diagnosis not present

## 2022-10-21 DIAGNOSIS — N186 End stage renal disease: Secondary | ICD-10-CM | POA: Diagnosis not present

## 2022-10-21 DIAGNOSIS — Z992 Dependence on renal dialysis: Secondary | ICD-10-CM | POA: Diagnosis not present

## 2022-10-22 DIAGNOSIS — Z992 Dependence on renal dialysis: Secondary | ICD-10-CM | POA: Diagnosis not present

## 2022-10-22 DIAGNOSIS — N186 End stage renal disease: Secondary | ICD-10-CM | POA: Diagnosis not present

## 2022-10-23 ENCOUNTER — Other Ambulatory Visit: Payer: Self-pay | Admitting: Family Medicine

## 2022-10-23 DIAGNOSIS — S32302G Unspecified fracture of left ilium, subsequent encounter for fracture with delayed healing: Secondary | ICD-10-CM

## 2022-10-23 DIAGNOSIS — Z992 Dependence on renal dialysis: Secondary | ICD-10-CM | POA: Diagnosis not present

## 2022-10-23 DIAGNOSIS — N186 End stage renal disease: Secondary | ICD-10-CM | POA: Diagnosis not present

## 2022-10-23 DIAGNOSIS — M87052 Idiopathic aseptic necrosis of left femur: Secondary | ICD-10-CM

## 2022-10-23 MED ORDER — HYDROCODONE-ACETAMINOPHEN 5-325 MG PO TABS: 1.0000 | ORAL_TABLET | Freq: Three times a day (TID) | ORAL | 0 refills | Status: DC | PRN

## 2022-10-24 DIAGNOSIS — Z992 Dependence on renal dialysis: Secondary | ICD-10-CM | POA: Diagnosis not present

## 2022-10-24 DIAGNOSIS — N186 End stage renal disease: Secondary | ICD-10-CM | POA: Diagnosis not present

## 2022-10-25 DIAGNOSIS — Z992 Dependence on renal dialysis: Secondary | ICD-10-CM | POA: Diagnosis not present

## 2022-10-25 DIAGNOSIS — N186 End stage renal disease: Secondary | ICD-10-CM | POA: Diagnosis not present

## 2022-10-26 DIAGNOSIS — N186 End stage renal disease: Secondary | ICD-10-CM | POA: Diagnosis not present

## 2022-10-26 DIAGNOSIS — Z992 Dependence on renal dialysis: Secondary | ICD-10-CM | POA: Diagnosis not present

## 2022-10-27 ENCOUNTER — Ambulatory Visit (INDEPENDENT_AMBULATORY_CARE_PROVIDER_SITE_OTHER): Payer: Medicare HMO | Admitting: Family Medicine

## 2022-10-27 ENCOUNTER — Encounter: Payer: Self-pay | Admitting: Family Medicine

## 2022-10-27 VITALS — BP 110/74 | Ht 70.0 in | Wt 128.0 lb

## 2022-10-27 DIAGNOSIS — M87052 Idiopathic aseptic necrosis of left femur: Secondary | ICD-10-CM

## 2022-10-27 DIAGNOSIS — S32302G Unspecified fracture of left ilium, subsequent encounter for fracture with delayed healing: Secondary | ICD-10-CM

## 2022-10-27 DIAGNOSIS — N186 End stage renal disease: Secondary | ICD-10-CM | POA: Diagnosis not present

## 2022-10-27 DIAGNOSIS — Z992 Dependence on renal dialysis: Secondary | ICD-10-CM | POA: Diagnosis not present

## 2022-10-27 NOTE — Progress Notes (Signed)
  Derrick Moore - 57 y.o. male MRN [id number]  Date of birth: 1966/01/31  SUBJECTIVE:  Including CC & ROS.  No chief complaint on file.   Derrick Moore is a 57 y.o. male that is following up for his pelvis fracture.  He still has pain with prolonged standing or transitioning from sitting to standing.  He wants to get back to his normal activities at work.   Review of Systems See HPI   HISTORY: Past Medical, Surgical, Social, and Family History Reviewed & Updated per EMR.   Pertinent Historical Findings include:  Past Medical History:  Diagnosis Date   Dialysis patient    Hyperlipidemia    Hypertension    Renal failure    Renal failure    Stroke     History reviewed. No pertinent surgical history.   PHYSICAL EXAM:  VS: BP 110/74 (BP Location: Left Arm, Patient Position: Sitting)   Ht 5\' 10"  (1.778 m)   Wt 128 lb (58.1 kg)   BMI 18.37 kg/m  Physical Exam Gen: NAD, alert, cooperative with exam, well-appearing MSK:  Neurovascularly intact       ASSESSMENT & PLAN:   Closed fracture of left iliac wing ([place]) Initial injury occurred on 12/5.  Continues to have pain.  He has not started physical therapy yet.  His PTH is still elevated and is considering surgery. -Counseled on home exercise therapy and supportive care. -Advised to continue with physical therapy. -Provided prescription for brace. - could consider bone stimulator    Avascular necrosis of femoral head, left ([place]) Could consider injection if pain is more anterior in nature.

## 2022-10-27 NOTE — Assessment & Plan Note (Signed)
Could consider injection if pain is more anterior in nature.

## 2022-10-27 NOTE — Patient Instructions (Signed)
Good to see you [facility]  Please continue with physical therapy   Please send me a message in MyChart with any questions or updates.  Please see me back in 4 weeks.   --Dr. Raeford Razor

## 2022-10-27 NOTE — Assessment & Plan Note (Signed)
Initial injury occurred on 12/5.  Continues to have pain.  He has not started physical therapy yet.  His PTH is still elevated and is considering surgery. -Counseled on home exercise therapy and supportive care. -Advised to continue with physical therapy. -Provided prescription for brace. - could consider bone stimulator

## 2022-10-28 DIAGNOSIS — Z992 Dependence on renal dialysis: Secondary | ICD-10-CM | POA: Diagnosis not present

## 2022-10-28 DIAGNOSIS — N186 End stage renal disease: Secondary | ICD-10-CM | POA: Diagnosis not present

## 2022-10-29 DIAGNOSIS — Z992 Dependence on renal dialysis: Secondary | ICD-10-CM | POA: Diagnosis not present

## 2022-10-29 DIAGNOSIS — N186 End stage renal disease: Secondary | ICD-10-CM | POA: Diagnosis not present

## 2022-10-30 DIAGNOSIS — N186 End stage renal disease: Secondary | ICD-10-CM | POA: Diagnosis not present

## 2022-10-30 DIAGNOSIS — Z992 Dependence on renal dialysis: Secondary | ICD-10-CM | POA: Diagnosis not present

## 2022-10-31 DIAGNOSIS — N186 End stage renal disease: Secondary | ICD-10-CM | POA: Diagnosis not present

## 2022-10-31 DIAGNOSIS — Z992 Dependence on renal dialysis: Secondary | ICD-10-CM | POA: Diagnosis not present

## 2022-11-01 DIAGNOSIS — N186 End stage renal disease: Secondary | ICD-10-CM | POA: Diagnosis not present

## 2022-11-01 DIAGNOSIS — Z992 Dependence on renal dialysis: Secondary | ICD-10-CM | POA: Diagnosis not present

## 2022-11-02 DIAGNOSIS — Z992 Dependence on renal dialysis: Secondary | ICD-10-CM | POA: Diagnosis not present

## 2022-11-02 DIAGNOSIS — N186 End stage renal disease: Secondary | ICD-10-CM | POA: Diagnosis not present

## 2022-11-03 DIAGNOSIS — Z992 Dependence on renal dialysis: Secondary | ICD-10-CM | POA: Diagnosis not present

## 2022-11-03 DIAGNOSIS — Z5181 Encounter for therapeutic drug level monitoring: Secondary | ICD-10-CM | POA: Diagnosis not present

## 2022-11-03 DIAGNOSIS — Z79899 Other long term (current) drug therapy: Secondary | ICD-10-CM | POA: Diagnosis not present

## 2022-11-03 DIAGNOSIS — N186 End stage renal disease: Secondary | ICD-10-CM | POA: Diagnosis not present

## 2022-11-03 DIAGNOSIS — E785 Hyperlipidemia, unspecified: Secondary | ICD-10-CM | POA: Diagnosis not present

## 2022-11-04 DIAGNOSIS — N186 End stage renal disease: Secondary | ICD-10-CM | POA: Diagnosis not present

## 2022-11-04 DIAGNOSIS — Z992 Dependence on renal dialysis: Secondary | ICD-10-CM | POA: Diagnosis not present

## 2022-11-05 DIAGNOSIS — N186 End stage renal disease: Secondary | ICD-10-CM | POA: Diagnosis not present

## 2022-11-05 DIAGNOSIS — Z992 Dependence on renal dialysis: Secondary | ICD-10-CM | POA: Diagnosis not present

## 2022-11-06 DIAGNOSIS — N186 End stage renal disease: Secondary | ICD-10-CM | POA: Diagnosis not present

## 2022-11-06 DIAGNOSIS — Z992 Dependence on renal dialysis: Secondary | ICD-10-CM | POA: Diagnosis not present

## 2022-11-07 DIAGNOSIS — Z992 Dependence on renal dialysis: Secondary | ICD-10-CM | POA: Diagnosis not present

## 2022-11-07 DIAGNOSIS — N186 End stage renal disease: Secondary | ICD-10-CM | POA: Diagnosis not present

## 2022-11-08 DIAGNOSIS — Z992 Dependence on renal dialysis: Secondary | ICD-10-CM | POA: Diagnosis not present

## 2022-11-08 DIAGNOSIS — N186 End stage renal disease: Secondary | ICD-10-CM | POA: Diagnosis not present

## 2022-11-09 DIAGNOSIS — Z992 Dependence on renal dialysis: Secondary | ICD-10-CM | POA: Diagnosis not present

## 2022-11-09 DIAGNOSIS — N186 End stage renal disease: Secondary | ICD-10-CM | POA: Diagnosis not present

## 2022-11-10 ENCOUNTER — Encounter: Payer: Self-pay | Admitting: *Deleted

## 2022-11-10 DIAGNOSIS — N186 End stage renal disease: Secondary | ICD-10-CM | POA: Diagnosis not present

## 2022-11-10 DIAGNOSIS — Z992 Dependence on renal dialysis: Secondary | ICD-10-CM | POA: Diagnosis not present

## 2022-11-11 DIAGNOSIS — Z992 Dependence on renal dialysis: Secondary | ICD-10-CM | POA: Diagnosis not present

## 2022-11-11 DIAGNOSIS — N186 End stage renal disease: Secondary | ICD-10-CM | POA: Diagnosis not present

## 2022-11-12 DIAGNOSIS — Z992 Dependence on renal dialysis: Secondary | ICD-10-CM | POA: Diagnosis not present

## 2022-11-12 DIAGNOSIS — N186 End stage renal disease: Secondary | ICD-10-CM | POA: Diagnosis not present

## 2022-11-13 DIAGNOSIS — N186 End stage renal disease: Secondary | ICD-10-CM | POA: Diagnosis not present

## 2022-11-13 DIAGNOSIS — Z992 Dependence on renal dialysis: Secondary | ICD-10-CM | POA: Diagnosis not present

## 2022-11-14 DIAGNOSIS — N186 End stage renal disease: Secondary | ICD-10-CM | POA: Diagnosis not present

## 2022-11-14 DIAGNOSIS — Z992 Dependence on renal dialysis: Secondary | ICD-10-CM | POA: Diagnosis not present

## 2022-11-15 DIAGNOSIS — Z992 Dependence on renal dialysis: Secondary | ICD-10-CM | POA: Diagnosis not present

## 2022-11-15 DIAGNOSIS — N186 End stage renal disease: Secondary | ICD-10-CM | POA: Diagnosis not present

## 2022-11-16 ENCOUNTER — Other Ambulatory Visit: Payer: Self-pay | Admitting: Family Medicine

## 2022-11-16 DIAGNOSIS — S32302G Unspecified fracture of left ilium, subsequent encounter for fracture with delayed healing: Secondary | ICD-10-CM

## 2022-11-16 DIAGNOSIS — M87052 Idiopathic aseptic necrosis of left femur: Secondary | ICD-10-CM

## 2022-11-16 DIAGNOSIS — N186 End stage renal disease: Secondary | ICD-10-CM | POA: Diagnosis not present

## 2022-11-16 DIAGNOSIS — Z992 Dependence on renal dialysis: Secondary | ICD-10-CM | POA: Diagnosis not present

## 2022-11-17 DIAGNOSIS — Z992 Dependence on renal dialysis: Secondary | ICD-10-CM | POA: Diagnosis not present

## 2022-11-17 DIAGNOSIS — N186 End stage renal disease: Secondary | ICD-10-CM | POA: Diagnosis not present

## 2022-11-17 MED ORDER — HYDROCODONE-ACETAMINOPHEN 5-325 MG PO TABS: 1.0000 | ORAL_TABLET | Freq: Three times a day (TID) | ORAL | 0 refills | Status: DC | PRN

## 2022-11-18 DIAGNOSIS — Z992 Dependence on renal dialysis: Secondary | ICD-10-CM | POA: Diagnosis not present

## 2022-11-18 DIAGNOSIS — N186 End stage renal disease: Secondary | ICD-10-CM | POA: Diagnosis not present

## 2022-11-19 DIAGNOSIS — Z992 Dependence on renal dialysis: Secondary | ICD-10-CM | POA: Diagnosis not present

## 2022-11-19 DIAGNOSIS — N186 End stage renal disease: Secondary | ICD-10-CM | POA: Diagnosis not present

## 2022-11-20 DIAGNOSIS — N186 End stage renal disease: Secondary | ICD-10-CM | POA: Diagnosis not present

## 2022-11-20 DIAGNOSIS — Z992 Dependence on renal dialysis: Secondary | ICD-10-CM | POA: Diagnosis not present

## 2022-11-21 DIAGNOSIS — Z992 Dependence on renal dialysis: Secondary | ICD-10-CM | POA: Diagnosis not present

## 2022-11-21 DIAGNOSIS — N186 End stage renal disease: Secondary | ICD-10-CM | POA: Diagnosis not present

## 2022-11-22 DIAGNOSIS — N186 End stage renal disease: Secondary | ICD-10-CM | POA: Diagnosis not present

## 2022-11-22 DIAGNOSIS — Z992 Dependence on renal dialysis: Secondary | ICD-10-CM | POA: Diagnosis not present

## 2022-11-23 DIAGNOSIS — Z992 Dependence on renal dialysis: Secondary | ICD-10-CM | POA: Diagnosis not present

## 2022-11-23 DIAGNOSIS — N186 End stage renal disease: Secondary | ICD-10-CM | POA: Diagnosis not present

## 2022-11-24 ENCOUNTER — Ambulatory Visit: Payer: Medicare HMO | Admitting: Family Medicine

## 2022-11-24 DIAGNOSIS — Z992 Dependence on renal dialysis: Secondary | ICD-10-CM | POA: Diagnosis not present

## 2022-11-24 DIAGNOSIS — N186 End stage renal disease: Secondary | ICD-10-CM | POA: Diagnosis not present

## 2022-11-25 DIAGNOSIS — Z992 Dependence on renal dialysis: Secondary | ICD-10-CM | POA: Diagnosis not present

## 2022-11-25 DIAGNOSIS — N186 End stage renal disease: Secondary | ICD-10-CM | POA: Diagnosis not present

## 2022-11-26 DIAGNOSIS — N186 End stage renal disease: Secondary | ICD-10-CM | POA: Diagnosis not present

## 2022-11-26 DIAGNOSIS — Z992 Dependence on renal dialysis: Secondary | ICD-10-CM | POA: Diagnosis not present

## 2022-11-27 DIAGNOSIS — N186 End stage renal disease: Secondary | ICD-10-CM | POA: Diagnosis not present

## 2022-11-27 DIAGNOSIS — Z992 Dependence on renal dialysis: Secondary | ICD-10-CM | POA: Diagnosis not present

## 2022-11-28 DIAGNOSIS — Z992 Dependence on renal dialysis: Secondary | ICD-10-CM | POA: Diagnosis not present

## 2022-11-28 DIAGNOSIS — N186 End stage renal disease: Secondary | ICD-10-CM | POA: Diagnosis not present

## 2022-11-29 DIAGNOSIS — N186 End stage renal disease: Secondary | ICD-10-CM | POA: Diagnosis not present

## 2022-11-29 DIAGNOSIS — Z992 Dependence on renal dialysis: Secondary | ICD-10-CM | POA: Diagnosis not present

## 2022-11-30 DIAGNOSIS — Z992 Dependence on renal dialysis: Secondary | ICD-10-CM | POA: Diagnosis not present

## 2022-11-30 DIAGNOSIS — N186 End stage renal disease: Secondary | ICD-10-CM | POA: Diagnosis not present

## 2022-12-01 DIAGNOSIS — Z992 Dependence on renal dialysis: Secondary | ICD-10-CM | POA: Diagnosis not present

## 2022-12-01 DIAGNOSIS — N186 End stage renal disease: Secondary | ICD-10-CM | POA: Diagnosis not present

## 2022-12-02 ENCOUNTER — Other Ambulatory Visit: Payer: Self-pay | Admitting: Family Medicine

## 2022-12-02 DIAGNOSIS — Z992 Dependence on renal dialysis: Secondary | ICD-10-CM | POA: Diagnosis not present

## 2022-12-02 DIAGNOSIS — N186 End stage renal disease: Secondary | ICD-10-CM | POA: Diagnosis not present

## 2022-12-02 DIAGNOSIS — S32302G Unspecified fracture of left ilium, subsequent encounter for fracture with delayed healing: Secondary | ICD-10-CM

## 2022-12-02 DIAGNOSIS — M87052 Idiopathic aseptic necrosis of left femur: Secondary | ICD-10-CM

## 2022-12-03 DIAGNOSIS — Z992 Dependence on renal dialysis: Secondary | ICD-10-CM | POA: Diagnosis not present

## 2022-12-03 DIAGNOSIS — N186 End stage renal disease: Secondary | ICD-10-CM | POA: Diagnosis not present

## 2022-12-03 MED ORDER — HYDROCODONE-ACETAMINOPHEN 5-325 MG PO TABS: 1.0000 | ORAL_TABLET | Freq: Three times a day (TID) | ORAL | 0 refills | Status: DC | PRN

## 2022-12-04 DIAGNOSIS — Z992 Dependence on renal dialysis: Secondary | ICD-10-CM | POA: Diagnosis not present

## 2022-12-04 DIAGNOSIS — N186 End stage renal disease: Secondary | ICD-10-CM | POA: Diagnosis not present

## 2022-12-05 DIAGNOSIS — Z992 Dependence on renal dialysis: Secondary | ICD-10-CM | POA: Diagnosis not present

## 2022-12-05 DIAGNOSIS — N186 End stage renal disease: Secondary | ICD-10-CM | POA: Diagnosis not present

## 2022-12-06 DIAGNOSIS — N186 End stage renal disease: Secondary | ICD-10-CM | POA: Diagnosis not present

## 2022-12-06 DIAGNOSIS — Z992 Dependence on renal dialysis: Secondary | ICD-10-CM | POA: Diagnosis not present

## 2022-12-07 DIAGNOSIS — Z992 Dependence on renal dialysis: Secondary | ICD-10-CM | POA: Diagnosis not present

## 2022-12-07 DIAGNOSIS — N186 End stage renal disease: Secondary | ICD-10-CM | POA: Diagnosis not present

## 2022-12-08 DIAGNOSIS — Z992 Dependence on renal dialysis: Secondary | ICD-10-CM | POA: Diagnosis not present

## 2022-12-08 DIAGNOSIS — N186 End stage renal disease: Secondary | ICD-10-CM | POA: Diagnosis not present

## 2022-12-09 DIAGNOSIS — N186 End stage renal disease: Secondary | ICD-10-CM | POA: Diagnosis not present

## 2022-12-09 DIAGNOSIS — Z992 Dependence on renal dialysis: Secondary | ICD-10-CM | POA: Diagnosis not present

## 2022-12-10 DIAGNOSIS — Z992 Dependence on renal dialysis: Secondary | ICD-10-CM | POA: Diagnosis not present

## 2022-12-10 DIAGNOSIS — N186 End stage renal disease: Secondary | ICD-10-CM | POA: Diagnosis not present

## 2022-12-11 DIAGNOSIS — Z992 Dependence on renal dialysis: Secondary | ICD-10-CM | POA: Diagnosis not present

## 2022-12-11 DIAGNOSIS — N186 End stage renal disease: Secondary | ICD-10-CM | POA: Diagnosis not present

## 2022-12-12 DIAGNOSIS — N186 End stage renal disease: Secondary | ICD-10-CM | POA: Diagnosis not present

## 2022-12-12 DIAGNOSIS — Z992 Dependence on renal dialysis: Secondary | ICD-10-CM | POA: Diagnosis not present

## 2022-12-13 DIAGNOSIS — N186 End stage renal disease: Secondary | ICD-10-CM | POA: Diagnosis not present

## 2022-12-13 DIAGNOSIS — Z992 Dependence on renal dialysis: Secondary | ICD-10-CM | POA: Diagnosis not present

## 2022-12-14 DIAGNOSIS — Z992 Dependence on renal dialysis: Secondary | ICD-10-CM | POA: Diagnosis not present

## 2022-12-14 DIAGNOSIS — N186 End stage renal disease: Secondary | ICD-10-CM | POA: Diagnosis not present

## 2022-12-15 DIAGNOSIS — N186 End stage renal disease: Secondary | ICD-10-CM | POA: Diagnosis not present

## 2022-12-15 DIAGNOSIS — Z992 Dependence on renal dialysis: Secondary | ICD-10-CM | POA: Diagnosis not present

## 2022-12-16 DIAGNOSIS — Z992 Dependence on renal dialysis: Secondary | ICD-10-CM | POA: Diagnosis not present

## 2022-12-16 DIAGNOSIS — N186 End stage renal disease: Secondary | ICD-10-CM | POA: Diagnosis not present

## 2022-12-17 DIAGNOSIS — N186 End stage renal disease: Secondary | ICD-10-CM | POA: Diagnosis not present

## 2022-12-17 DIAGNOSIS — Z992 Dependence on renal dialysis: Secondary | ICD-10-CM | POA: Diagnosis not present

## 2022-12-18 ENCOUNTER — Ambulatory Visit (INDEPENDENT_AMBULATORY_CARE_PROVIDER_SITE_OTHER): Payer: Medicare HMO | Admitting: Family Medicine

## 2022-12-18 ENCOUNTER — Ambulatory Visit (HOSPITAL_BASED_OUTPATIENT_CLINIC_OR_DEPARTMENT_OTHER)
Admission: RE | Admit: 2022-12-18 | Discharge: 2022-12-18 | Disposition: A | Discharge status: Home / Self Care | Payer: Medicare HMO | Source: Ambulatory Visit | Attending: Family Medicine | Admitting: Family Medicine

## 2022-12-18 ENCOUNTER — Encounter: Payer: Self-pay | Admitting: Family Medicine

## 2022-12-18 VITALS — BP 128/98 | Ht 70.0 in | Wt 128.0 lb

## 2022-12-18 DIAGNOSIS — N186 End stage renal disease: Secondary | ICD-10-CM | POA: Diagnosis not present

## 2022-12-18 DIAGNOSIS — S3289XA Fracture of other parts of pelvis, initial encounter for closed fracture: Secondary | ICD-10-CM | POA: Diagnosis not present

## 2022-12-18 DIAGNOSIS — Z992 Dependence on renal dialysis: Secondary | ICD-10-CM | POA: Diagnosis not present

## 2022-12-18 DIAGNOSIS — S32302G Unspecified fracture of left ilium, subsequent encounter for fracture with delayed healing: Secondary | ICD-10-CM

## 2022-12-18 NOTE — Assessment & Plan Note (Signed)
Initial injury occurred on 12/5.  Pain is still occurring but less severe.  Continues in physical therapy.  He is still continuing with his dialysis -Counseled on home exercise therapy and supportive care. -Continue physical therapy. - X-ray. - referral to orthopedic surgery. - Completed paperwork

## 2022-12-18 NOTE — Patient Instructions (Signed)
Good to see you Please continue physical therapy  We'll call with the xray results.  We have placed a referral to the surgeon  Please send me a message in MyChart with any questions or updates.  Please see the clinic back as needed.   --Dr. Jordan Likes

## 2022-12-18 NOTE — Progress Notes (Signed)
  Derrick Moore - 57 y.o. male MRN [id number]  Date of birth: March 22, 1966  SUBJECTIVE:  Including CC & ROS.  No chief complaint on file.   Derrick Moore is a 57 y.o. male that is following up for his fracture and pain.  He continues in physical therapy.  Denies any worsening of his pain.  Denies any altered sensation and no radicular pain.   Review of Systems See HPI   HISTORY: Past Medical, Surgical, Social, and Family History Reviewed & Updated per EMR.   Pertinent Historical Findings include:  Past Medical History:  Diagnosis Date   Dialysis patient [facility])    Hyperlipidemia    Hypertension    Renal failure    Renal failure    Stroke [facility])     History reviewed. No pertinent surgical history.   PHYSICAL EXAM:  VS: BP (!) 128/98 (BP Location: Left Arm, Patient Position: Sitting)   Ht 5\' 10"  (1.778 m)   Wt 128 lb (58.1 kg)   BMI 18.37 kg/m  Physical Exam Gen: NAD, alert, cooperative with exam, well-appearing MSK:  Neurovascularly intact       ASSESSMENT & PLAN:   Closed fracture of left iliac wing ([facility]) Initial injury occurred on 12/5.  Pain is still occurring but less severe.  Continues in physical therapy.  He is still continuing with his dialysis -Counseled on home exercise therapy and supportive care. -Continue physical therapy. - X-ray. - referral to orthopedic surgery. - Completed paperwork

## 2022-12-19 DIAGNOSIS — N186 End stage renal disease: Secondary | ICD-10-CM | POA: Diagnosis not present

## 2022-12-19 DIAGNOSIS — Z992 Dependence on renal dialysis: Secondary | ICD-10-CM | POA: Diagnosis not present

## 2022-12-20 DIAGNOSIS — Z992 Dependence on renal dialysis: Secondary | ICD-10-CM | POA: Diagnosis not present

## 2022-12-20 DIAGNOSIS — N186 End stage renal disease: Secondary | ICD-10-CM | POA: Diagnosis not present

## 2022-12-21 DIAGNOSIS — N186 End stage renal disease: Secondary | ICD-10-CM | POA: Diagnosis not present

## 2022-12-21 DIAGNOSIS — Z992 Dependence on renal dialysis: Secondary | ICD-10-CM | POA: Diagnosis not present

## 2022-12-22 ENCOUNTER — Other Ambulatory Visit: Payer: Self-pay | Admitting: Family Medicine

## 2022-12-22 DIAGNOSIS — N186 End stage renal disease: Secondary | ICD-10-CM | POA: Diagnosis not present

## 2022-12-22 DIAGNOSIS — Z992 Dependence on renal dialysis: Secondary | ICD-10-CM | POA: Diagnosis not present

## 2022-12-22 DIAGNOSIS — M87052 Idiopathic aseptic necrosis of left femur: Secondary | ICD-10-CM

## 2022-12-22 DIAGNOSIS — S32302G Unspecified fracture of left ilium, subsequent encounter for fracture with delayed healing: Secondary | ICD-10-CM

## 2022-12-23 DIAGNOSIS — Z992 Dependence on renal dialysis: Secondary | ICD-10-CM | POA: Diagnosis not present

## 2022-12-23 DIAGNOSIS — N186 End stage renal disease: Secondary | ICD-10-CM | POA: Diagnosis not present

## 2022-12-24 DIAGNOSIS — Z992 Dependence on renal dialysis: Secondary | ICD-10-CM | POA: Diagnosis not present

## 2022-12-24 DIAGNOSIS — N186 End stage renal disease: Secondary | ICD-10-CM | POA: Diagnosis not present

## 2022-12-25 DIAGNOSIS — N186 End stage renal disease: Secondary | ICD-10-CM | POA: Diagnosis not present

## 2022-12-25 DIAGNOSIS — Z992 Dependence on renal dialysis: Secondary | ICD-10-CM | POA: Diagnosis not present

## 2022-12-26 DIAGNOSIS — Z992 Dependence on renal dialysis: Secondary | ICD-10-CM | POA: Diagnosis not present

## 2022-12-26 DIAGNOSIS — N186 End stage renal disease: Secondary | ICD-10-CM | POA: Diagnosis not present

## 2022-12-27 DIAGNOSIS — N186 End stage renal disease: Secondary | ICD-10-CM | POA: Diagnosis not present

## 2022-12-27 DIAGNOSIS — Z992 Dependence on renal dialysis: Secondary | ICD-10-CM | POA: Diagnosis not present

## 2022-12-28 DIAGNOSIS — Z992 Dependence on renal dialysis: Secondary | ICD-10-CM | POA: Diagnosis not present

## 2022-12-28 DIAGNOSIS — N186 End stage renal disease: Secondary | ICD-10-CM | POA: Diagnosis not present

## 2022-12-28 NOTE — Progress Notes (Deleted)
MRN : [id number]  Derrick Moore is a 57 y.o. (October 04, 1965) male who presents with chief complaint of check access.  History of Present Illness:   The patient is seen for evaluation for dialysis access. The patient has end stage renal insufficiency hypertension. The patient's most recent creatinine clearance is less than 3. The patient volume status has not yet become an issue as he has been sustained on PD. Patient's blood pressures been relatively well controlled. There are mild uremic symptoms which appear to be relatively well tolerated at this time, secondary to poor function of his PD catheter.   The patient notes the kidney problem has been present for a long time and has been progressively getting worse.   The patient is followed by nephrology.     The patient is right-handed.   The patient has been considering the various methods of dialysis and wishes to transition to hemodialysis and therefore he needs creation of AV access.   No recent shortening of the patient's walking distance or new symptoms consistent with claudication.  No history of rest pain symptoms. No new ulcers or wounds of the lower extremities have occurred.   The patient denies amaurosis fugax or recent TIA symptoms. There are no recent neurological changes noted. There is no history of DVT, PE or superficial thrombophlebitis. No recent episodes of angina or shortness of breath documented.     Upper extremity noninvasive studies obtained today demonstrate normal arterial flow bilateral upper extremities.  Venous mapping shows very small 1 to 2 mm veins inadequate for fistula creation.  No outpatient medications have been marked as taking for the 12/29/22 encounter (Appointment) with Gilda Crease, Latina Craver, MD.    Past Medical History:  Diagnosis Date   Dialysis patient [facility])    Hyperlipidemia    Hypertension    Renal failure    Renal failure    Stroke [facility])     No past surgical  history on file.  Social History Social History   Tobacco Use   Smoking status: Every Day    Packs/day: 0.25    Years: 33.00    Additional pack years: 0.00    Total pack years: 8.25    Types: Cigarettes   Smokeless tobacco: Never  Vaping Use   Vaping Use: Never used  Substance Use Topics   Alcohol use: Not Currently    Alcohol/week: 1.0 standard drink of alcohol    Types: 1 Cans of beer per week   Drug use: Yes    Types: Marijuana    Family History No family history on file.  No Known Allergies   REVIEW OF SYSTEMS (Negative unless checked)  Constitutional: [] Weight loss  [] Fever  [] Chills Cardiac: [] Chest pain   [] Chest pressure   [] Palpitations   [] Shortness of breath when laying flat   [] Shortness of breath with exertion. Vascular:  [] Pain in legs with walking   [] Pain in legs at rest  [] History of DVT   [] Phlebitis   [] Swelling in legs   [] Varicose veins   [] Non-healing ulcers Pulmonary:   [] Uses home oxygen   [] Productive cough   [] Hemoptysis   [] Wheeze  [] COPD   [] Asthma Neurologic:  [] Dizziness   [] Seizures   [] History of stroke   [] History of TIA  [] Aphasia   [] Vissual changes   [] Weakness or numbness in arm   [] Weakness or numbness in leg Musculoskeletal:   []   Joint swelling   [] Joint pain   [] Low back pain Hematologic:  [] Easy bruising  [] Easy bleeding   [] Hypercoagulable state   [] Anemic Gastrointestinal:  [] Diarrhea   [] Vomiting  [] Gastroesophageal reflux/heartburn   [] Difficulty swallowing. Genitourinary:  [x] Chronic kidney disease   [] Difficult urination  [] Frequent urination   [] Blood in urine Skin:  [] Rashes   [] Ulcers  Psychological:  [] History of anxiety   []  History of major depression.  Physical Examination  There were no vitals filed for this visit. There is no height or weight on file to calculate BMI. Gen: WD/WN, NAD Head: [place]/AT, No temporalis wasting.  Ear/Nose/Throat: Hearing grossly intact, nares w/o erythema or drainage Eyes: PER, EOMI,  sclera nonicteric.  Neck: Supple, no gross masses or lesions.  No JVD.  Pulmonary:  Good air movement, no audible wheezing, no use of accessory muscles.  Cardiac: RRR, precordium non-hyperdynamic. Vascular:   *** Vessel Right Left  Radial Palpable Palpable  Brachial Palpable Palpable  Gastrointestinal: soft, non-distended. No guarding/no peritoneal signs.  Musculoskeletal: M/S 5/5 throughout.  No deformity.  Neurologic: CN 2-12 intact. Pain and light touch intact in extremities.  Symmetrical.  Speech is fluent. Motor exam as listed above. Psychiatric: Judgment intact, Mood & affect appropriate for pt's clinical situation. Dermatologic: No rashes or ulcers noted.  No changes consistent with cellulitis.   CBC Lab Results  Component Value Date   WBC 5.5 07/17/2022   HGB 9.7 (L) 07/17/2022   HCT 29.6 (L) 07/17/2022   MCV 94 07/17/2022   PLT 271 07/17/2022    BMET    Component Value Date/Time   NA 132 (L) 07/02/2022 0310   NA 139 01/02/2022 1515   K 4.8 07/02/2022 0310   CL 104 07/02/2022 0310   CO2 13 (L) 07/02/2022 0310   GLUCOSE 92 07/02/2022 0310   BUN 112 (H) 07/02/2022 0310   BUN 89 (HH) 01/02/2022 1515   CREATININE 18.36 (H) 07/02/2022 0310   CALCIUM 8.1 (L) 07/17/2022 1122   GFRNONAA 3 (L) 07/02/2022 0310   CrCl cannot be calculated (Patient's most recent lab result is older than the maximum 21 days allowed.).  COAG Lab Results  Component Value Date   INR 1.3 (H) 02/22/2022    Radiology DG HIP UNILAT W OR W/O PELVIS 2-3 VIEWS LEFT  Result Date: 12/22/2022 CLINICAL DATA:  Pelvic fracture. EXAM: DG HIP (WITH OR WITHOUT PELVIS) 2-3V LEFT COMPARISON:  September 26, 2022 FINDINGS: Stable fracture through the iliac wing. Stable widening of the SI joints. Stable widening of the pubic symphysis with irregularity and possible fractures involving the adjacent pubic bones, unchanged. The left hip is intact with no fracture or dislocation. No interval changes. IMPRESSION: 1.  Stable fracture through the left iliac wing. 2. Stable widening of the SI joints. 3. Stable widening of the pubic symphysis with irregularity and possible stable fractures involving the adjacent pubic bones. 4. No interval changes. Electronically Signed   By: Gerome Sam III M.D.   On: 12/22/2022 13:36     Assessment/Plan There are no diagnoses linked to this encounter.   Levora Dredge, MD  12/28/2022 3:32 PM

## 2022-12-29 ENCOUNTER — Ambulatory Visit (INDEPENDENT_AMBULATORY_CARE_PROVIDER_SITE_OTHER): Payer: Medicare HMO | Admitting: Vascular Surgery

## 2022-12-29 ENCOUNTER — Encounter (INDEPENDENT_AMBULATORY_CARE_PROVIDER_SITE_OTHER): Payer: Self-pay

## 2022-12-29 DIAGNOSIS — E785 Hyperlipidemia, unspecified: Secondary | ICD-10-CM

## 2022-12-29 DIAGNOSIS — N186 End stage renal disease: Secondary | ICD-10-CM | POA: Diagnosis not present

## 2022-12-29 DIAGNOSIS — M5416 Radiculopathy, lumbar region: Secondary | ICD-10-CM

## 2022-12-29 DIAGNOSIS — Z992 Dependence on renal dialysis: Secondary | ICD-10-CM | POA: Diagnosis not present

## 2022-12-29 DIAGNOSIS — I10 Essential (primary) hypertension: Secondary | ICD-10-CM

## 2022-12-30 ENCOUNTER — Emergency Department (HOSPITAL_BASED_OUTPATIENT_CLINIC_OR_DEPARTMENT_OTHER)
Admission: EM | Admit: 2022-12-30 | Discharge: 2022-12-30 | Disposition: A | Discharge status: Home / Self Care | Payer: Medicare HMO | Attending: Emergency Medicine | Admitting: Emergency Medicine

## 2022-12-30 ENCOUNTER — Encounter (HOSPITAL_BASED_OUTPATIENT_CLINIC_OR_DEPARTMENT_OTHER): Payer: Self-pay | Admitting: Emergency Medicine

## 2022-12-30 ENCOUNTER — Other Ambulatory Visit: Payer: Self-pay

## 2022-12-30 DIAGNOSIS — I120 Hypertensive chronic kidney disease with stage 5 chronic kidney disease or end stage renal disease: Secondary | ICD-10-CM | POA: Diagnosis not present

## 2022-12-30 DIAGNOSIS — K0889 Other specified disorders of teeth and supporting structures: Secondary | ICD-10-CM | POA: Diagnosis not present

## 2022-12-30 DIAGNOSIS — R030 Elevated blood-pressure reading, without diagnosis of hypertension: Secondary | ICD-10-CM

## 2022-12-30 DIAGNOSIS — N186 End stage renal disease: Secondary | ICD-10-CM | POA: Diagnosis not present

## 2022-12-30 DIAGNOSIS — Z992 Dependence on renal dialysis: Secondary | ICD-10-CM | POA: Diagnosis not present

## 2022-12-30 DIAGNOSIS — I10 Essential (primary) hypertension: Secondary | ICD-10-CM | POA: Diagnosis not present

## 2022-12-30 MED ORDER — HYDROCODONE-ACETAMINOPHEN 5-325 MG PO TABS: 1.0000 | ORAL_TABLET | Freq: Four times a day (QID) | ORAL | 0 refills | Status: DC | PRN

## 2022-12-30 NOTE — Discharge Instructions (Addendum)
It was a pleasure taking care of you here in the emergency department.  Make sure to keep your appointment on Friday to have your tooth removed.  I have written you for a short course of pain medicine.  Please take as prescribed.  Do not drive or operate heavy machinery as this is a narcotic substance.  Continue to go to dialysis as previously scheduled  Your blood pressure has been elevated here today.  Make sure to follow-up with your primary care provider for this

## 2022-12-30 NOTE — ED Triage Notes (Signed)
Pt c/o dental pain; has appt to have tooth extracted on Fri, but is requesting pain medication to last him until then

## 2022-12-30 NOTE — ED Provider Notes (Signed)
[place] [facility] Provider Note   CSN: [id number] Arrival date & time: 12/30/22  1425    History  Chief Complaint  Patient presents with   Dental Pain    Derrick Moore is a 57 y.o. male history of hypertension, ESRD here for evaluation of right dental pain.  He noted this began 2 to 3 weeks ago after dental filling fell out.  He describes as an aching sensation, worse with eating or drinking anything hot or cold.  He has been using Tylenol and Orajel which has helped however he still has pain primarily when eating.  He was seen by oral surgery today who recommended he come to the emergency department narcotic pain medicine as they apparently do not prescribe this.  Denies any facial swelling, redness or warmth.  No fever.  He has been going to his dialysis as previously scheduled.  Denies any complications of this.  Apparently cannot have tooth removed in the office as he is on Plavix and need to be off of this for a few days prior to having dental procedure performed.  No chest pain, shortness of breath, neck pain.     HPI     Home Medications Prior to Admission medications   Medication Sig Start Date End Date Taking? Authorizing Provider  HYDROcodone-acetaminophen (NORCO/VICODIN) 5-325 MG tablet Take 1 tablet by mouth every 6 (six) hours as needed for severe pain. 12/30/22  Yes Sarely Stracener A, PA-C  amLODipine (NORVASC) 5 MG tablet Take 5 mg by mouth at bedtime.    [provider]  cinacalcet (SENSIPAR) 60 MG tablet Take 60 mg by mouth daily.    [provider]  clopidogrel (PLAVIX) 75 MG tablet Take 1 tablet (75 mg total) by mouth daily. 07/09/22   Hughie Closs, MD  furosemide (LASIX) 40 MG tablet Take 100 mg by mouth 2 (two) times daily. 09/10/22   [provider]  labetalol (NORMODYNE) 200 MG tablet Take 200 mg by mouth 2 (two) times daily.    [provider]  lidocaine (LIDODERM) 5 % Place 1 patch  onto the skin daily. Remove & Discard patch within 12 hours or as directed by MD Patient not taking: Reported on 07/01/2022 02/12/22   Virgina Norfolk, DO  lisinopril (ZESTRIL) 30 MG tablet Take 30 mg by mouth at bedtime. 07/04/20   [provider]  ondansetron (ZOFRAN) 4 MG tablet Take 1 tablet (4 mg total) by mouth every 6 (six) hours. Patient not taking: Reported on 07/01/2022 03/16/22   Virgina Norfolk, DO  pravastatin (PRAVACHOL) 20 MG tablet Take by mouth. 05/19/19   [provider]  PRESCRIPTION MEDICATION Tylenol 3    [provider]  sodium polystyrene (KAYEXALATE) 15 GM/60ML suspension Take 60 ml by mouth daily for 2 days. Patient not taking: Reported on 07/01/2022 03/10/22   Saguier, [name], PA-C  Vitamin D, Ergocalciferol, (DRISDOL) 1.25 MG (50000 UNIT) CAPS capsule Take 50,000 Units by mouth every 30 (thirty) days. 09/18/22   [provider]      Allergies    Patient has no known allergies.    Review of Systems   Review of Systems  Constitutional: Negative.   HENT:  Positive for dental problem. Negative for congestion, drooling, ear discharge, ear pain, facial swelling, hearing loss, mouth sores, nosebleeds, postnasal drip, rhinorrhea, sinus pressure, sinus pain, sneezing, sore throat and trouble swallowing.   Respiratory: Negative.    Cardiovascular: Negative.   Gastrointestinal: Negative.  Genitourinary: Negative.   Musculoskeletal: Negative.   Skin: Negative.   Neurological: Negative.   All other systems reviewed and are negative.  Physical Exam Updated Vital Signs BP (!) 177/128 (BP Location: Right Arm)   Pulse 81   Temp 97.9 F (36.6 C) (Oral)   Resp 18   Ht 5\' 10"  (1.778 m)   Wt 72.6 kg   SpO2 99%   BMI 22.96 kg/m  Physical Exam Vitals and nursing note reviewed.  Constitutional:      General: He is not in acute distress.    Appearance: He is well-developed. He is not ill-appearing, toxic-appearing or diaphoretic.  HENT:      Head: Normocephalic and atraumatic.     Jaw: There is normal jaw occlusion.     Comments: No facial swelling, no submandibular induration    Mouth/Throat:     Lips: Pink.     Mouth: Mucous membranes are moist.     Dentition: Abnormal dentition. Does not have dentures. Dental tenderness and dental caries present. No gingival swelling, dental abscesses or gum lesions.     Pharynx: Oropharynx is clear. Uvula midline.     Tonsils: No tonsillar exudate or tonsillar abscesses. 0 on the right. 0 on the left.      Comments: Multiple dental caries, loose tooth right upper dentition.  Tongue midline, sublingual area soft.  No gingival erythema, abscess Eyes:     Pupils: Pupils are equal, round, and reactive to light.  Neck:     Comments: Full range of motion Cardiovascular:     Rate and Rhythm: Normal rate and regular rhythm.  Pulmonary:     Effort: Pulmonary effort is normal. No respiratory distress.  Abdominal:     General: There is no distension.     Palpations: Abdomen is soft.  Musculoskeletal:        General: Normal range of motion.     Cervical back: Normal range of motion and neck supple.  Skin:    General: Skin is warm and dry.  Neurological:     General: No focal deficit present.     Mental Status: He is alert and oriented to person, place, and time.     ED Results / Procedures / Treatments   Labs (all labs ordered are listed, but only abnormal results are displayed) Labs Reviewed - No data to display  EKG None  Radiology No results found.  Procedures Procedures    Medications Ordered in ED Medications - No data to display  ED Course/ Medical Decision Making/ A&P   57 year old ESRD here for evaluation of dental pain which began 2 to 3 weeks ago.  He has achy pain worse to his posterior upper dentition, worse with food, hot and cold substances.  He has no evidence of facial cellulitis.  No evidence of Ludwig's angina, low suspicion for deep space infection.  He is  compliant with his dialysis.  No chest pain, shortness of breath.  He is hypertensive here however appears to be at his baseline per chart review.  No chest pain, shortness of breath, headache, dizziness, nausea vomiting or abdominal pain.  Low suspicion for hypertensive urgency or emergency. He does have paper with him from his oral surgeon where he will be having an extraction on Friday, tooth #1.  They recommend he come here for pain medicine as they apparently do not prescribe narcotic pain medicine.  Will write for short course of Norco.  No chest pain, shortness of breath to suggest  atypical ACS.  He is encouraged to follow-up with his PCP for his hypertension as well as follow-up with dentistry on Friday for his extraction.  The patient has been appropriately medically screened and/or stabilized in the ED. I have low suspicion for any other emergent medical condition which would require further screening, evaluation or treatment in the ED or require inpatient management.  Patient is hemodynamically stable and in no acute distress.  Patient able to ambulate in department prior to ED.  Evaluation does not show acute pathology that would require ongoing or additional emergent interventions while in the emergency department or further inpatient treatment.  I have discussed the diagnosis with the patient and answered all questions.  Pain is been managed while in the emergency department and patient has no further complaints prior to discharge.  Patient is comfortable with plan discussed in room and is stable for discharge at this time.  I have discussed strict return precautions for returning to the emergency department.  Patient was encouraged to follow-up with PCP/specialist refer to at discharge.                              Medical Decision Making Amount and/or Complexity of Data Reviewed External Data Reviewed: labs and notes.  Risk OTC drugs. Prescription drug management. Decision regarding  hospitalization. Diagnosis or treatment significantly limited by social determinants of health.           Final Clinical Impression(s) / ED Diagnoses Final diagnoses:  Pain, dental  Elevated blood pressure reading    Rx / DC Orders ED Discharge Orders          Ordered    HYDROcodone-acetaminophen (NORCO/VICODIN) 5-325 MG tablet  Every 6 hours PRN        12/30/22 1526              Baran Kuhrt A, PA-C 12/30/22 1546    Vanetta Mulders, MD 12/30/22 1631

## 2022-12-31 DIAGNOSIS — Z992 Dependence on renal dialysis: Secondary | ICD-10-CM | POA: Diagnosis not present

## 2022-12-31 DIAGNOSIS — N186 End stage renal disease: Secondary | ICD-10-CM | POA: Diagnosis not present

## 2023-01-01 DIAGNOSIS — Z992 Dependence on renal dialysis: Secondary | ICD-10-CM | POA: Diagnosis not present

## 2023-01-01 DIAGNOSIS — N186 End stage renal disease: Secondary | ICD-10-CM | POA: Diagnosis not present

## 2023-01-02 ENCOUNTER — Ambulatory Visit (INDEPENDENT_AMBULATORY_CARE_PROVIDER_SITE_OTHER): Payer: Medicare HMO | Admitting: Medical

## 2023-01-02 ENCOUNTER — Encounter: Payer: Self-pay | Admitting: Medical

## 2023-01-02 VITALS — BP 158/80 | HR 94 | Temp 98.0°F | Resp 18 | Ht 70.0 in | Wt 134.0 lb

## 2023-01-02 DIAGNOSIS — N186 End stage renal disease: Secondary | ICD-10-CM

## 2023-01-02 DIAGNOSIS — K0889 Other specified disorders of teeth and supporting structures: Secondary | ICD-10-CM | POA: Diagnosis not present

## 2023-01-02 DIAGNOSIS — Z992 Dependence on renal dialysis: Secondary | ICD-10-CM

## 2023-01-02 DIAGNOSIS — I10 Essential (primary) hypertension: Secondary | ICD-10-CM | POA: Diagnosis not present

## 2023-01-02 DIAGNOSIS — R9431 Abnormal electrocardiogram [ECG] [EKG]: Secondary | ICD-10-CM

## 2023-01-02 DIAGNOSIS — D649 Anemia, unspecified: Secondary | ICD-10-CM | POA: Diagnosis not present

## 2023-01-02 DIAGNOSIS — R011 Cardiac murmur, unspecified: Secondary | ICD-10-CM

## 2023-01-02 MED ORDER — AMOXICILLIN 500 MG PO CAPS: 500.0000 mg | ORAL_CAPSULE | Freq: Two times a day (BID) | ORAL | 0 refills | Status: AC

## 2023-01-02 NOTE — Progress Notes (Signed)
Subjective:    Patient ID: Derrick Moore, male    DOB: 04/18/1966, 57 y.o.   MRN: [id number]  HPI   Pt in for evaluation of tooth pain. Pt rt upper molar tooth needs to be extracted. Pt is on dialysis. Pt get dialysis from home every day. No  hx of  heart surgery. No prosthetic valves. Pt not vien prophlactic antibiotic in ED the other day. But given pain medication.  Pt has history of stroke in the past. Has hx of stroke/TIA. Last tia in 2016. He is on plavix. Describe various tia and one time in more severe stroke.  Pt is going to have IV sedation. Pt states he had bad experience with lidocain injection alone for anesthesia  states pain was not well controlled furing dental produres.    Pt on plavix due to history of tia.  Has seen dentis but not oral surgeon. He state has been referred but  needs to have clearance/guidance regarding plavis.   Htn- pt bp is always high. Latetalol 200 mg bid. Lisinopril 30 mg at bedtime and amlodipine 5 mg daily. His bp varies April and march very good control most recenlty increased. Most recently mild-moderate high. No associated cardiac or neurologic signs/symptoms.  Pt does daily dialyis at [facility] as has End stage renal disease.   Review of Systems  Constitutional:  Negative for chills, fatigue and fever.  HENT:  Negative for congestion and ear pain.        Rt upper molar tooth pain.  Respiratory:  Negative for cough, chest tightness, shortness of breath and wheezing.   Cardiovascular:  Negative for chest pain and palpitations.  Gastrointestinal:  Negative for abdominal pain.  Genitourinary:  Negative for frequency.  Neurological:  Negative for dizziness, weakness and numbness.  Hematological:  Negative for adenopathy. Does not bruise/bleed easily.  Psychiatric/Behavioral:  Negative for behavioral problems and confusion.    Past Medical History:  Diagnosis Date   Dialysis patient [facility])    Hyperlipidemia    Hypertension    Renal  failure    Renal failure    Stroke [facility])      Social History   Socioeconomic History   Marital status: Divorced    Spouse name: Not on file   Number of children: Not on file   Years of education: Not on file   Highest education level: Some college, no degree  Occupational History   Not on file  Tobacco Use   Smoking status: Every Day    Packs/day: 0.25    Years: 33.00    Additional pack years: 0.00    Total pack years: 8.25    Types: Cigarettes   Smokeless tobacco: Never  Vaping Use   Vaping Use: Never used  Substance and Sexual Activity   Alcohol use: Not Currently    Alcohol/week: 1.0 standard drink of alcohol    Types: 1 Cans of beer per week   Drug use: Yes    Types: Marijuana   Sexual activity: Not on file  Other Topics Concern   Not on file  Social History Narrative   Not on file   Social Determinants of Health   Financial Resource Strain: Low Risk  (12/30/2022)   Overall Financial Resource Strain (CARDIA)    Difficulty of Paying Living Expenses: Not hard at all  Food Insecurity: Food Insecurity Present (12/30/2022)   Hunger Vital Sign    Worried About Running Out of Food in the Last Year: Sometimes true  Ran Out of Food in the Last Year: Never true  Transportation Needs: No Transportation Needs (12/30/2022)   PRAPARE - Administrator, Civil Service (Medical): No    Lack of Transportation (Non-Medical): No  Physical Activity: Insufficiently Active (12/30/2022)   Exercise Vital Sign    Days of Exercise per Week: 3 days    Minutes of Exercise per Session: 30 min  Stress: No Stress Concern Present (12/30/2022)   [employer] of Occupational Health - Occupational Stress Questionnaire    Feeling of Stress : Only a little  Social Connections: Socially Isolated (12/30/2022)   Social Connection and Isolation Panel [NHANES]    Frequency of Communication with Friends and Family: Twice a week    Frequency of Social Gatherings with Friends and Family: Once  a week    Attends Religious Services: Never    Database administrator or Organizations: No    Attends Banker Meetings: Never    Marital Status: Divorced  Catering manager Violence: Not At Risk (07/02/2022)   Humiliation, Afraid, Rape, and Kick questionnaire    Fear of Current or Ex-Partner: No    Emotionally Abused: No    Physically Abused: No    Sexually Abused: No    No past surgical history on file.  No family history on file.  No Known Allergies  Current Outpatient Medications on File Prior to Visit  Medication Sig Dispense Refill   amLODipine (NORVASC) 5 MG tablet Take 5 mg by mouth at bedtime.     cinacalcet (SENSIPAR) 60 MG tablet Take 60 mg by mouth daily.     clopidogrel (PLAVIX) 75 MG tablet Take 1 tablet (75 mg total) by mouth daily. 90 tablet 3   furosemide (LASIX) 40 MG tablet Take 100 mg by mouth 2 (two) times daily.     HYDROcodone-acetaminophen (NORCO/VICODIN) 5-325 MG tablet Take 1 tablet by mouth every 6 (six) hours as needed for severe pain. 15 tablet 0   labetalol (NORMODYNE) 200 MG tablet Take 200 mg by mouth 2 (two) times daily.     lidocaine (LIDODERM) 5 % Place 1 patch onto the skin daily. Remove & Discard patch within 12 hours or as directed by MD (Patient not taking: Reported on 07/01/2022) 30 patch 0   lisinopril (ZESTRIL) 30 MG tablet Take 30 mg by mouth at bedtime.     ondansetron (ZOFRAN) 4 MG tablet Take 1 tablet (4 mg total) by mouth every 6 (six) hours. (Patient not taking: Reported on 07/01/2022) 12 tablet 0   pravastatin (PRAVACHOL) 20 MG tablet Take by mouth.     PRESCRIPTION MEDICATION Tylenol 3     sodium polystyrene (KAYEXALATE) 15 GM/60ML suspension Take 60 ml by mouth daily for 2 days. (Patient not taking: Reported on 07/01/2022) 240 mL 0   Vitamin D, Ergocalciferol, (DRISDOL) 1.25 MG (50000 UNIT) CAPS capsule Take 50,000 Units by mouth every 30 (thirty) days.     No current facility-administered medications on file prior to visit.     BP (!) 158/80   Pulse 94   Temp 98 F (36.7 C)   Resp 18   Ht 5\' 10"  (1.778 m)   Wt 134 lb (60.8 kg)   SpO2 98%   BMI 19.23 kg/m        Objective:   Physical Exam  General Mental Status- Alert. General Appearance- Not in acute distress.   Skin General: Color- Normal Color. Moisture- Normal Moisture.  Neck Carotid Arteries- Normal color. Moisture-  Normal Moisture. No carotid bruits. No JVD.  Chest and Lung Exam Auscultation: Breath Sounds:-Normal.  Cardiovascular Auscultation:Rythm- Regular but thoght heard murmur.    Abdomen Inspection:-Inspeection Normal. Palpation/Percussion:Note:No mass. Palpation and Percussion of the abdomen reveal- Non Tender, Non Distended + BS, no rebound or guarding.  Mouth- rt upper back side molar/wisdom tooth erupted, slight disolored and very tender to light palpation  Neurologic Cranial Nerve exam:- CN III-XII intact(No nystagmus), symmetric smile. Strength:- 5/5 equal and symmetric strength both upper and lower extremities.       Assessment & Plan:   Patient Instructions  1. Tooth pain Rx amoxicillin concern for infection of molar.(Lower dose rx)  2. Hypertension, unspecified type Continue current bp meds. Check bp daily and update me on readings. May need to adjust bp med regimen.   3. ESRD on dialysis [facility]) Continue daily dialysis.   4. Anemia, unspecified type Check cbc and platlets today  5. Abnormal EKG On review old  EKG state prolonged qt interval. Did not see that on today EKG. Appered nsr. Nonsepecific t wave anbormality. - EKG 12-Lead  6. Hx of tia and on plavix. On exam heard murmur. Get opinion from cardiology and likely echo. Get opinion on holding of plavix as well.  Follow up date to be determined after lab review.   Esperanza Richters, PA-C

## 2023-01-02 NOTE — Patient Instructions (Addendum)
1. Tooth pain Rx amoxicillin concern for infection of molar.(Lower dose rx)  2. Hypertension, unspecified type Continue current bp meds. Check bp daily and update me on readings. May need to adjust bp med regimen.   3. ESRD on dialysis [facility]) Continue daily dialysis.   4. Anemia, unspecified type Check cbc and platlets today  5. Abnormal EKG On review old  EKG state prolonged qt interval. Did not see that on today EKG. Appered nsr. Nonsepecific t wave anbormality. - EKG 12-Lead  6. Hx of tia and on plavix. On exam heard murmur. Get opinion from cardiology and likely echo. Get opinion on holding of plavix as well.  Follow up date to be determined after lab review.

## 2023-01-03 DIAGNOSIS — Z992 Dependence on renal dialysis: Secondary | ICD-10-CM | POA: Diagnosis not present

## 2023-01-03 DIAGNOSIS — N186 End stage renal disease: Secondary | ICD-10-CM | POA: Diagnosis not present

## 2023-01-03 LAB — CBC WITH DIFFERENTIAL/PLATELET
Absolute Monocytes: 530 cells/uL (ref 200–950)
Basophils Absolute: 31 cells/uL (ref 0–200)
Basophils Relative: 0.6 %
Eosinophils Absolute: 321 cells/uL (ref 15–500)
Eosinophils Relative: 6.3 %
HCT: 30.1 % — ABNORMAL LOW (ref 38.5–50.0)
Hemoglobin: 9.7 g/dL — ABNORMAL LOW (ref 13.2–17.1)
Lymphs Abs: 959 cells/uL (ref 850–3900)
MCH: 30.2 pg (ref 27.0–33.0)
MCHC: 32.2 g/dL (ref 32.0–36.0)
MCV: 93.8 fL (ref 80.0–100.0)
MPV: 10 fL (ref 7.5–12.5)
Monocytes Relative: 10.4 %
Neutro Abs: 3259 cells/uL (ref 1500–7800)
Neutrophils Relative %: 63.9 %
Platelets: 258 10*3/uL (ref 140–400)
RBC: 3.21 10*6/uL — ABNORMAL LOW (ref 4.20–5.80)
RDW: 16.1 % — ABNORMAL HIGH (ref 11.0–15.0)
Total Lymphocyte: 18.8 %
WBC: 5.1 10*3/uL (ref 3.8–10.8)

## 2023-01-03 LAB — COMPREHENSIVE METABOLIC PANEL
AG Ratio: 1.4 (calc) (ref 1.0–2.5)
ALT: 6 U/L — ABNORMAL LOW (ref 9–46)
AST: 8 U/L — ABNORMAL LOW (ref 10–35)
Albumin: 3.9 g/dL (ref 3.6–5.1)
Alkaline phosphatase (APISO): 147 U/L — ABNORMAL HIGH (ref 35–144)
BUN/Creatinine Ratio: 5 (calc) — ABNORMAL LOW (ref 6–22)
BUN: 81 mg/dL — ABNORMAL HIGH (ref 7–25)
CO2: 17 mmol/L — ABNORMAL LOW (ref 20–32)
Calcium: 8.1 mg/dL — ABNORMAL LOW (ref 8.6–10.3)
Chloride: 103 mmol/L (ref 98–110)
Creat: 15.37 mg/dL — ABNORMAL HIGH (ref 0.70–1.30)
Globulin: 2.8 g/dL (calc) (ref 1.9–3.7)
Glucose, Bld: 80 mg/dL (ref 65–99)
Potassium: 4.7 mmol/L (ref 3.5–5.3)
Sodium: 137 mmol/L (ref 135–146)
Total Bilirubin: 0.3 mg/dL (ref 0.2–1.2)
Total Protein: 6.7 g/dL (ref 6.1–8.1)

## 2023-01-04 DIAGNOSIS — N186 End stage renal disease: Secondary | ICD-10-CM | POA: Diagnosis not present

## 2023-01-04 DIAGNOSIS — Z992 Dependence on renal dialysis: Secondary | ICD-10-CM | POA: Diagnosis not present

## 2023-01-05 ENCOUNTER — Telehealth: Payer: Self-pay

## 2023-01-05 DIAGNOSIS — Z992 Dependence on renal dialysis: Secondary | ICD-10-CM | POA: Diagnosis not present

## 2023-01-05 DIAGNOSIS — N186 End stage renal disease: Secondary | ICD-10-CM | POA: Diagnosis not present

## 2023-01-05 NOTE — Telephone Encounter (Signed)
Lab Name [facility] Lab Phone Number [phone] Lab Tech Name [facility] Lab Reference Number [id number] Patient Name RENDER Achord Patient DOB Mar 19, 2066 Call Type Lab Message Only Reason for Call Report lab results Initial Comment The caller states she has some urgent lab results. Disp. Time Disposition Final User 01/03/2023 1:25:12 PM General Information Provided Yes Margo Aye, Bessie Call Closed By: Zebedee Iba Transaction Date/Time: 01/03/2023 1:21:16 PM (ET)

## 2023-01-05 NOTE — Telephone Encounter (Signed)
Edward reviewed labs and so did pt

## 2023-01-06 DIAGNOSIS — N186 End stage renal disease: Secondary | ICD-10-CM | POA: Diagnosis not present

## 2023-01-06 DIAGNOSIS — Z992 Dependence on renal dialysis: Secondary | ICD-10-CM | POA: Diagnosis not present

## 2023-01-07 DIAGNOSIS — N186 End stage renal disease: Secondary | ICD-10-CM | POA: Diagnosis not present

## 2023-01-07 DIAGNOSIS — Z992 Dependence on renal dialysis: Secondary | ICD-10-CM | POA: Diagnosis not present

## 2023-01-08 DIAGNOSIS — N186 End stage renal disease: Secondary | ICD-10-CM | POA: Diagnosis not present

## 2023-01-08 DIAGNOSIS — Z992 Dependence on renal dialysis: Secondary | ICD-10-CM | POA: Diagnosis not present

## 2023-01-09 DIAGNOSIS — N186 End stage renal disease: Secondary | ICD-10-CM | POA: Diagnosis not present

## 2023-01-09 DIAGNOSIS — Z992 Dependence on renal dialysis: Secondary | ICD-10-CM | POA: Diagnosis not present

## 2023-01-10 DIAGNOSIS — Z992 Dependence on renal dialysis: Secondary | ICD-10-CM | POA: Diagnosis not present

## 2023-01-10 DIAGNOSIS — N186 End stage renal disease: Secondary | ICD-10-CM | POA: Diagnosis not present

## 2023-01-11 DIAGNOSIS — N186 End stage renal disease: Secondary | ICD-10-CM | POA: Diagnosis not present

## 2023-01-11 DIAGNOSIS — Z992 Dependence on renal dialysis: Secondary | ICD-10-CM | POA: Diagnosis not present

## 2023-01-12 DIAGNOSIS — M810 Age-related osteoporosis without current pathological fracture: Secondary | ICD-10-CM | POA: Diagnosis not present

## 2023-01-12 DIAGNOSIS — D631 Anemia in chronic kidney disease: Secondary | ICD-10-CM | POA: Diagnosis not present

## 2023-01-12 DIAGNOSIS — I120 Hypertensive chronic kidney disease with stage 5 chronic kidney disease or end stage renal disease: Secondary | ICD-10-CM | POA: Diagnosis not present

## 2023-01-12 DIAGNOSIS — Z720 Tobacco use: Secondary | ICD-10-CM | POA: Diagnosis not present

## 2023-01-12 DIAGNOSIS — M461 Sacroiliitis, not elsewhere classified: Secondary | ICD-10-CM | POA: Diagnosis not present

## 2023-01-12 DIAGNOSIS — N2581 Secondary hyperparathyroidism of renal origin: Secondary | ICD-10-CM | POA: Diagnosis not present

## 2023-01-12 DIAGNOSIS — I69351 Hemiplegia and hemiparesis following cerebral infarction affecting right dominant side: Secondary | ICD-10-CM | POA: Diagnosis not present

## 2023-01-12 DIAGNOSIS — K047 Periapical abscess without sinus: Secondary | ICD-10-CM | POA: Diagnosis not present

## 2023-01-12 DIAGNOSIS — E785 Hyperlipidemia, unspecified: Secondary | ICD-10-CM | POA: Diagnosis not present

## 2023-01-12 DIAGNOSIS — N186 End stage renal disease: Secondary | ICD-10-CM | POA: Diagnosis not present

## 2023-01-12 DIAGNOSIS — D8481 Immunodeficiency due to conditions classified elsewhere: Secondary | ICD-10-CM | POA: Diagnosis not present

## 2023-01-12 DIAGNOSIS — Z992 Dependence on renal dialysis: Secondary | ICD-10-CM | POA: Diagnosis not present

## 2023-01-13 DIAGNOSIS — N186 End stage renal disease: Secondary | ICD-10-CM | POA: Diagnosis not present

## 2023-01-13 DIAGNOSIS — Z992 Dependence on renal dialysis: Secondary | ICD-10-CM | POA: Diagnosis not present

## 2023-01-14 DIAGNOSIS — N186 End stage renal disease: Secondary | ICD-10-CM | POA: Diagnosis not present

## 2023-01-14 DIAGNOSIS — Z992 Dependence on renal dialysis: Secondary | ICD-10-CM | POA: Diagnosis not present

## 2023-01-15 DIAGNOSIS — Z992 Dependence on renal dialysis: Secondary | ICD-10-CM | POA: Diagnosis not present

## 2023-01-15 DIAGNOSIS — N186 End stage renal disease: Secondary | ICD-10-CM | POA: Diagnosis not present

## 2023-01-16 DIAGNOSIS — N186 End stage renal disease: Secondary | ICD-10-CM | POA: Diagnosis not present

## 2023-01-16 DIAGNOSIS — Z992 Dependence on renal dialysis: Secondary | ICD-10-CM | POA: Diagnosis not present

## 2023-01-17 DIAGNOSIS — Z992 Dependence on renal dialysis: Secondary | ICD-10-CM | POA: Diagnosis not present

## 2023-01-17 DIAGNOSIS — N186 End stage renal disease: Secondary | ICD-10-CM | POA: Diagnosis not present

## 2023-01-18 DIAGNOSIS — Z992 Dependence on renal dialysis: Secondary | ICD-10-CM | POA: Diagnosis not present

## 2023-01-18 DIAGNOSIS — N186 End stage renal disease: Secondary | ICD-10-CM | POA: Diagnosis not present

## 2023-01-19 DIAGNOSIS — Z992 Dependence on renal dialysis: Secondary | ICD-10-CM | POA: Diagnosis not present

## 2023-01-19 DIAGNOSIS — N186 End stage renal disease: Secondary | ICD-10-CM | POA: Diagnosis not present

## 2023-01-20 DIAGNOSIS — N186 End stage renal disease: Secondary | ICD-10-CM | POA: Diagnosis not present

## 2023-01-20 DIAGNOSIS — Z992 Dependence on renal dialysis: Secondary | ICD-10-CM | POA: Diagnosis not present

## 2023-01-21 ENCOUNTER — Ambulatory Visit: Payer: Medicare HMO

## 2023-01-21 DIAGNOSIS — Z992 Dependence on renal dialysis: Secondary | ICD-10-CM | POA: Diagnosis not present

## 2023-01-21 DIAGNOSIS — N186 End stage renal disease: Secondary | ICD-10-CM | POA: Diagnosis not present

## 2023-01-22 ENCOUNTER — Ambulatory Visit
Admission: RE | Admit: 2023-01-22 | Discharge: 2023-01-22 | Disposition: A | Discharge status: Home / Self Care | Payer: Medicare HMO | Source: Ambulatory Visit | Attending: Urgent Care | Admitting: Urgent Care

## 2023-01-22 VITALS — BP 167/108 | HR 81 | Temp 98.1°F | Resp 20

## 2023-01-22 DIAGNOSIS — S80811A Abrasion, right lower leg, initial encounter: Secondary | ICD-10-CM

## 2023-01-22 DIAGNOSIS — L089 Local infection of the skin and subcutaneous tissue, unspecified: Secondary | ICD-10-CM

## 2023-01-22 DIAGNOSIS — Z992 Dependence on renal dialysis: Secondary | ICD-10-CM

## 2023-01-22 DIAGNOSIS — N186 End stage renal disease: Secondary | ICD-10-CM

## 2023-01-22 MED ORDER — DOXYCYCLINE HYCLATE 100 MG PO CAPS: 100.0000 mg | ORAL_CAPSULE | Freq: Two times a day (BID) | ORAL | 0 refills | Status: DC

## 2023-01-22 NOTE — Discharge Instructions (Addendum)
Start doxycycline for the infected abrasion. Change your dressing 2-3 times daily. Every time you change your dressing, clean the wound gently with warm water and Dial antibacterial soap. Pat the wound dry, let it breathe for roughly an hour before covering it back up. When you reapply a dressing, apply ointment to the wound, then cover with non-stick/non-adherent gauze.  After 4-7 days, the wound should scab over nicely and then you don't have to continue doing dressings.

## 2023-01-22 NOTE — ED Provider Notes (Signed)
[facility] - [facility]  Note:  This document was prepared using Conservation officer, historic buildings and may include unintentional dictation errors.  MRN: [id number] DOB: 1965/12/25  Subjective:   Derrick Moore is a 57 y.o. male presenting for 4-day history of acute persistent and worsening pain over the right lower leg.  Patient states that he scraped it against the metal bed frame that he has at home.  Since then has had persistent moderate pain.  Has been applying ointment.  Patient has ESRD and is on peritoneal dialysis.  No current facility-administered medications for this encounter.  Current Outpatient Medications:    amLODipine (NORVASC) 5 MG tablet, Take 5 mg by mouth at bedtime., Disp: , Rfl:    cinacalcet (SENSIPAR) 60 MG tablet, Take 60 mg by mouth daily., Disp: , Rfl:    clopidogrel (PLAVIX) 75 MG tablet, Take 1 tablet (75 mg total) by mouth daily., Disp: 90 tablet, Rfl: 3   furosemide (LASIX) 40 MG tablet, Take 100 mg by mouth 2 (two) times daily., Disp: , Rfl:    HYDROcodone-acetaminophen (NORCO/VICODIN) 5-325 MG tablet, Take 1 tablet by mouth every 6 (six) hours as needed for severe pain., Disp: 15 tablet, Rfl: 0   labetalol (NORMODYNE) 200 MG tablet, Take 200 mg by mouth 2 (two) times daily., Disp: , Rfl:    lidocaine (LIDODERM) 5 %, Place 1 patch onto the skin daily. Remove & Discard patch within 12 hours or as directed by MD (Patient not taking: Reported on 07/01/2022), Disp: 30 patch, Rfl: 0   lisinopril (ZESTRIL) 30 MG tablet, Take 30 mg by mouth at bedtime., Disp: , Rfl:    ondansetron (ZOFRAN) 4 MG tablet, Take 1 tablet (4 mg total) by mouth every 6 (six) hours. (Patient not taking: Reported on 07/01/2022), Disp: 12 tablet, Rfl: 0   pravastatin (PRAVACHOL) 20 MG tablet, Take by mouth., Disp: , Rfl:    PRESCRIPTION MEDICATION, Tylenol 3, Disp: , Rfl:    sodium polystyrene (KAYEXALATE) 15 GM/60ML suspension, Take 60 ml by mouth daily for 2 days. (Patient  not taking: Reported on 07/01/2022), Disp: 240 mL, Rfl: 0   Vitamin D, Ergocalciferol, (DRISDOL) 1.25 MG (50000 UNIT) CAPS capsule, Take 50,000 Units by mouth every 30 (thirty) days., Disp: , Rfl:    No Known Allergies  Past Medical History:  Diagnosis Date   Dialysis patient (HCC)    Hyperlipidemia    Hypertension    Renal failure    Renal failure    Stroke [facility])      History reviewed. No pertinent surgical history.  No family history on file.  Social History   Tobacco Use   Smoking status: Every Day    Packs/day: 0.25    Years: 33.00    Additional pack years: 0.00    Total pack years: 8.25    Types: Cigarettes   Smokeless tobacco: Never  Vaping Use   Vaping Use: Never used  Substance Use Topics   Alcohol use: Not Currently   Drug use: Yes    Types: Marijuana    ROS   Objective:   Vitals: BP (!) 167/108 (BP Location: Right Arm)   Pulse 81   Temp 98.1 F (36.7 C) (Oral)   Resp 20   SpO2 96%   Physical Exam Constitutional:      General: He is not in acute distress.    Appearance: Normal appearance. He is well-developed and normal weight. He is not ill-appearing, toxic-appearing or diaphoretic.  HENT:  Head: Normocephalic and atraumatic.     Right Ear: External ear normal.     Left Ear: External ear normal.     Nose: Nose normal.     Mouth/Throat:     Pharynx: Oropharynx is clear.  Eyes:     General: No scleral icterus.       Right eye: No discharge.        Left eye: No discharge.     Extraocular Movements: Extraocular movements intact.  Cardiovascular:     Rate and Rhythm: Normal rate.  Pulmonary:     Effort: Pulmonary effort is normal.  Musculoskeletal:     Cervical back: Normal range of motion.  Skin:      Neurological:     Mental Status: He is alert and oriented to person, place, and time.  Psychiatric:        Mood and Affect: Mood normal.        Behavior: Behavior normal.        Thought Content: Thought content normal.         Judgment: Judgment normal.       Assessment and Plan :   PDMP not reviewed this encounter.  1. Infected abrasion of skin of right lower leg   2. ESRD on dialysis [facility])    Dressing was applied, discussed wound care.  Start doxycycline for an infected abrasion.  Reports that his Tdap is today as he received it when he got placed on dialysis this year. Counseled patient on potential for adverse effects with medications prescribed/recommended today, ER and return-to-clinic precautions discussed, patient verbalized understanding.    [name], [place] 01/22/23 1039

## 2023-01-22 NOTE — ED Triage Notes (Signed)
Pt c/o pain, round red area to right calf area x 4 days-denies known injury-NAD-limping gait

## 2023-01-23 DIAGNOSIS — N186 End stage renal disease: Secondary | ICD-10-CM | POA: Diagnosis not present

## 2023-01-23 DIAGNOSIS — Z992 Dependence on renal dialysis: Secondary | ICD-10-CM | POA: Diagnosis not present

## 2023-01-24 DIAGNOSIS — N186 End stage renal disease: Secondary | ICD-10-CM | POA: Diagnosis not present

## 2023-01-24 DIAGNOSIS — Z992 Dependence on renal dialysis: Secondary | ICD-10-CM | POA: Diagnosis not present

## 2023-01-25 DIAGNOSIS — Z992 Dependence on renal dialysis: Secondary | ICD-10-CM | POA: Diagnosis not present

## 2023-01-25 DIAGNOSIS — N186 End stage renal disease: Secondary | ICD-10-CM | POA: Diagnosis not present

## 2023-01-26 DIAGNOSIS — N186 End stage renal disease: Secondary | ICD-10-CM | POA: Diagnosis not present

## 2023-01-26 DIAGNOSIS — Z992 Dependence on renal dialysis: Secondary | ICD-10-CM | POA: Diagnosis not present

## 2023-01-27 DIAGNOSIS — Z992 Dependence on renal dialysis: Secondary | ICD-10-CM | POA: Diagnosis not present

## 2023-01-27 DIAGNOSIS — N186 End stage renal disease: Secondary | ICD-10-CM | POA: Diagnosis not present

## 2023-01-28 DIAGNOSIS — N186 End stage renal disease: Secondary | ICD-10-CM | POA: Diagnosis not present

## 2023-01-28 DIAGNOSIS — Z992 Dependence on renal dialysis: Secondary | ICD-10-CM | POA: Diagnosis not present

## 2023-01-29 DIAGNOSIS — N186 End stage renal disease: Secondary | ICD-10-CM | POA: Diagnosis not present

## 2023-01-29 DIAGNOSIS — Z992 Dependence on renal dialysis: Secondary | ICD-10-CM | POA: Diagnosis not present

## 2023-01-30 DIAGNOSIS — Z992 Dependence on renal dialysis: Secondary | ICD-10-CM | POA: Diagnosis not present

## 2023-01-30 DIAGNOSIS — N186 End stage renal disease: Secondary | ICD-10-CM | POA: Diagnosis not present

## 2023-01-31 DIAGNOSIS — Z992 Dependence on renal dialysis: Secondary | ICD-10-CM | POA: Diagnosis not present

## 2023-01-31 DIAGNOSIS — N186 End stage renal disease: Secondary | ICD-10-CM | POA: Diagnosis not present

## 2023-02-01 DIAGNOSIS — Z992 Dependence on renal dialysis: Secondary | ICD-10-CM | POA: Diagnosis not present

## 2023-02-01 DIAGNOSIS — N186 End stage renal disease: Secondary | ICD-10-CM | POA: Diagnosis not present

## 2023-02-02 ENCOUNTER — Emergency Department (HOSPITAL_BASED_OUTPATIENT_CLINIC_OR_DEPARTMENT_OTHER)
Admission: EM | Admit: 2023-02-02 | Discharge: 2023-02-02 | Disposition: A | Discharge status: Home / Self Care | Payer: Medicare HMO | Attending: Emergency Medicine | Admitting: Emergency Medicine

## 2023-02-02 ENCOUNTER — Emergency Department (HOSPITAL_BASED_OUTPATIENT_CLINIC_OR_DEPARTMENT_OTHER): Payer: Medicare HMO

## 2023-02-02 DIAGNOSIS — M79604 Pain in right leg: Secondary | ICD-10-CM

## 2023-02-02 DIAGNOSIS — M79661 Pain in right lower leg: Secondary | ICD-10-CM | POA: Insufficient documentation

## 2023-02-02 DIAGNOSIS — N186 End stage renal disease: Secondary | ICD-10-CM | POA: Diagnosis not present

## 2023-02-02 DIAGNOSIS — W2203XA Walked into furniture, initial encounter: Secondary | ICD-10-CM | POA: Insufficient documentation

## 2023-02-02 DIAGNOSIS — M7989 Other specified soft tissue disorders: Secondary | ICD-10-CM | POA: Diagnosis not present

## 2023-02-02 DIAGNOSIS — Z992 Dependence on renal dialysis: Secondary | ICD-10-CM | POA: Diagnosis not present

## 2023-02-02 MED ORDER — OXYCODONE-ACETAMINOPHEN 5-325 MG PO TABS
1.0000 | ORAL_TABLET | Freq: Once | ORAL | Status: AC
  Administered 2023-02-02: 1 via ORAL
  Filled 2023-02-02: qty 1

## 2023-02-02 NOTE — ED Triage Notes (Signed)
Patient presents to ED via POV from home. Here with right posterior calf pain. Reports in the beginning of July he hit it on a bed frame. Ambulatory.

## 2023-02-02 NOTE — ED Provider Notes (Signed)
[facility] Provider Note   CSN: [id number] Arrival date & time: 02/02/23  1335     History {Add pertinent medical, surgical, social history, OB history to HPI:1} Chief Complaint  Patient presents with   Leg Pain    Derrick Moore is a 57 y.o. male.   Leg Pain  Patient is a 57 year old dialysis patient with history of strokes on Plavix  He is present emergency room today with complaints of right calf pain for approximately 5 days.  He states that he struck his calf on a bed frame at the beginning of July       Home Medications Prior to Admission medications   Medication Sig Start Date End Date Taking? Authorizing Provider  amLODipine (NORVASC) 5 MG tablet Take 5 mg by mouth at bedtime.    [provider]  cinacalcet (SENSIPAR) 60 MG tablet Take 60 mg by mouth daily.    [provider]  clopidogrel (PLAVIX) 75 MG tablet Take 1 tablet (75 mg total) by mouth daily. 07/09/22   Hughie Closs, MD  doxycycline (VIBRAMYCIN) 100 MG capsule Take 1 capsule (100 mg total) by mouth 2 (two) times daily. 01/22/23   [name], PA-C  furosemide (LASIX) 40 MG tablet Take 100 mg by mouth 2 (two) times daily. 09/10/22   [provider]  HYDROcodone-acetaminophen (NORCO/VICODIN) 5-325 MG tablet Take 1 tablet by mouth every 6 (six) hours as needed for severe pain. 12/30/22   Henderly, [name], PA-C  labetalol (NORMODYNE) 200 MG tablet Take 200 mg by mouth 2 (two) times daily.    [provider]  lidocaine (LIDODERM) 5 % Place 1 patch onto the skin daily. Remove & Discard patch within 12 hours or as directed by MD Patient not taking: Reported on 07/01/2022 02/12/22   Virgina Norfolk, DO  lisinopril (ZESTRIL) 30 MG tablet Take 30 mg by mouth at bedtime. 07/04/20   [provider]  ondansetron (ZOFRAN) 4 MG tablet Take 1 tablet (4 mg total) by mouth every 6 (six) hours. Patient not taking: Reported on 07/01/2022  03/16/22   Virgina Norfolk, DO  pravastatin (PRAVACHOL) 20 MG tablet Take by mouth. 05/19/19   [provider]  PRESCRIPTION MEDICATION Tylenol 3    [provider]  sodium polystyrene (KAYEXALATE) 15 GM/60ML suspension Take 60 ml by mouth daily for 2 days. Patient not taking: Reported on 07/01/2022 03/10/22   Saguier, [name], PA-C  Vitamin D, Ergocalciferol, (DRISDOL) 1.25 MG (50000 UNIT) CAPS capsule Take 50,000 Units by mouth every 30 (thirty) days. 09/18/22   [provider]      Allergies    Patient has no known allergies.    Review of Systems   Review of Systems  Physical Exam Updated Vital Signs BP (!) 143/97   Pulse 100   Temp 97.8 F (36.6 C) (Oral)   Resp 18   SpO2 99%  Physical Exam  ED Results / Procedures / Treatments   Labs (all labs ordered are listed, but only abnormal results are displayed) Labs Reviewed - No data to display  EKG None  Radiology No results found.  Procedures Procedures  {Document cardiac monitor, telemetry assessment procedure when appropriate:1}  Medications Ordered in ED Medications - No data to display  ED Course/ Medical Decision Making/ A&P   {   Click here for ABCD2, HEART and other calculatorsREFRESH Note before signing :1}  Medical Decision Making  ***  {Document critical care time when appropriate:1} {Document review of labs and clinical decision tools ie heart score, Chads2Vasc2 etc:1}  {Document your independent review of radiology images, and any outside records:1} {Document your discussion with family members, caretakers, and with consultants:1} {Document social determinants of health affecting pt's care:1} {Document your decision making why or why not admission, treatments were needed:1} Final Clinical Impression(s) / ED Diagnoses Final diagnoses:  None    Rx / DC Orders ED Discharge Orders     None

## 2023-02-02 NOTE — Discharge Instructions (Signed)
Keep the area clean with warm soap and water, apply a thin layer of Vaseline twice daily and keep covered with a Band-Aid when you are at work or in an environment where you could get the wound dirty.  You do not have any evidence of a blood clot.  I would recommend that you follow-up with your primary care doctor.  Tylenol 1000 mg every 6 hours for pain

## 2023-02-03 DIAGNOSIS — Z992 Dependence on renal dialysis: Secondary | ICD-10-CM | POA: Diagnosis not present

## 2023-02-03 DIAGNOSIS — N186 End stage renal disease: Secondary | ICD-10-CM | POA: Diagnosis not present

## 2023-02-04 ENCOUNTER — Encounter: Payer: Self-pay | Admitting: Family Medicine

## 2023-02-04 ENCOUNTER — Ambulatory Visit (INDEPENDENT_AMBULATORY_CARE_PROVIDER_SITE_OTHER): Payer: Medicare HMO | Admitting: Family Medicine

## 2023-02-04 ENCOUNTER — Ambulatory Visit: Payer: Medicare HMO | Admitting: Medical

## 2023-02-04 VITALS — BP 139/92 | HR 99 | Ht 70.0 in | Wt 127.6 lb

## 2023-02-04 DIAGNOSIS — M79661 Pain in right lower leg: Secondary | ICD-10-CM | POA: Diagnosis not present

## 2023-02-04 DIAGNOSIS — Z992 Dependence on renal dialysis: Secondary | ICD-10-CM | POA: Diagnosis not present

## 2023-02-04 DIAGNOSIS — N186 End stage renal disease: Secondary | ICD-10-CM | POA: Diagnosis not present

## 2023-02-04 MED ORDER — CYCLOBENZAPRINE HCL 5 MG PO TABS: 5.0000 mg | ORAL_TABLET | Freq: Three times a day (TID) | ORAL | 1 refills | Status: DC | PRN

## 2023-02-04 NOTE — Progress Notes (Signed)
Acute Office Visit  Subjective:     Patient ID: Derrick Moore, male    DOB: 1965-08-23, 58 y.o.   MRN: [id number]  Chief Complaint  Patient presents with   Leg injury     Back of right     HPI Patient is in today for right calf pain.  Discussed the use of AI scribe software for clinical note transcription with the patient, who gave verbal consent to proceed.  History of Present Illness   The patient, with a history of hypertension and on blood thinners, presents with a two-week history of a painful right calf following an injury to the back of the calf on a bed frame. The patient sought care at an urgent care center and the emergency department, where a DVT was ruled out via ultrasound. The patient was prescribed an doxycycline, which they completed.  The patient describes the pain as severe, likening it to a knife, and reports it is extremely tender to touch. The pain is so severe it disrupts their sleep and makes driving difficult due to the use of the affected leg for acceleration and braking. The patient also reports a sensation of tightness in the calf, describing it as if something is grabbing it. Despite attempts at stretching and using a topical cream, the patient reports no relief. The patient denies any drainage from the wound and no calf cramping. The patient has been using ice for the pain and has been taking 500mg  of Tylenol, which they report is not helping.            ROS All review of systems negative except what is listed in the HPI      Objective:    BP (!) 139/92   Pulse 99   Ht 5\' 10"  (1.778 m)   Wt 127 lb 9.6 oz (57.9 kg)   SpO2 100%   BMI 18.31 kg/m    Physical Exam Vitals reviewed.  Constitutional:      Appearance: Normal appearance.  Musculoskeletal:        General: No swelling. Normal range of motion.  Skin:    General: Skin is warm and dry.  Neurological:     General: No focal deficit present.     Mental Status: He is alert and  oriented to person, place, and time. Mental status is at baseline.  Psychiatric:        Mood and Affect: Mood normal.        Behavior: Behavior normal.        Thought Content: Thought content normal.        Judgment: Judgment normal.     No results found for any visits on 02/04/23.      Assessment & Plan:   Problem List Items Addressed This Visit   None Visit Diagnoses     Right calf pain    -  Primary Wound is healing nicely, small scab remaining in center, but otherwise resolved. No signs of infection.  Tylenol arthritis 2-3 times a day (Max of 3,000 mg in 24 hours) Start Flexeril as needed - could make you drowsy, be cautious  Heat, ice, massage, ice-hot cream, Voltaren etc (do not put creams directly onto the scab) Monitor for any new or worsening symptoms.   BP is elevated. Please monitor at home and if still >140/90 schedule a follow-up.       Relevant Medications   cyclobenzaprine (FLEXERIL) 5 MG tablet       Meds ordered  this encounter  Medications   cyclobenzaprine (FLEXERIL) 5 MG tablet    Sig: Take 1 tablet (5 mg total) by mouth 3 (three) times daily as needed for muscle spasms.    Dispense:  30 tablet    Refill:  1    Order Specific Question:   Supervising Provider    Answer:   Danise Edge A [[id number]]    Return if symptoms worsen or fail to improve.  [name], NP

## 2023-02-04 NOTE — Patient Instructions (Addendum)
Wound is healing nicely, small scab remaining in center, but otherwise resolved. No signs of infection.  Tylenol arthritis 2-3 times a day (Max of 3,000 mg in 24 hours) Start Flexeril as needed - could make you drowsy, be cautious  Heat, ice, massage, ice-hot cream, Voltaren etc (do not put creams directly onto the scab) Monitor for any new or worsening symptoms.   BP is elevated. Please monitor at home and if still >140/90 schedule a follow-up.

## 2023-02-05 DIAGNOSIS — N186 End stage renal disease: Secondary | ICD-10-CM | POA: Diagnosis not present

## 2023-02-05 DIAGNOSIS — Z992 Dependence on renal dialysis: Secondary | ICD-10-CM | POA: Diagnosis not present

## 2023-02-06 DIAGNOSIS — Z992 Dependence on renal dialysis: Secondary | ICD-10-CM | POA: Diagnosis not present

## 2023-02-06 DIAGNOSIS — N186 End stage renal disease: Secondary | ICD-10-CM | POA: Diagnosis not present

## 2023-02-07 DIAGNOSIS — Z992 Dependence on renal dialysis: Secondary | ICD-10-CM | POA: Diagnosis not present

## 2023-02-07 DIAGNOSIS — N186 End stage renal disease: Secondary | ICD-10-CM | POA: Diagnosis not present

## 2023-02-08 DIAGNOSIS — Z992 Dependence on renal dialysis: Secondary | ICD-10-CM | POA: Diagnosis not present

## 2023-02-08 DIAGNOSIS — N186 End stage renal disease: Secondary | ICD-10-CM | POA: Diagnosis not present

## 2023-02-09 ENCOUNTER — Emergency Department (HOSPITAL_BASED_OUTPATIENT_CLINIC_OR_DEPARTMENT_OTHER): Payer: Medicare HMO

## 2023-02-09 ENCOUNTER — Other Ambulatory Visit: Payer: Self-pay

## 2023-02-09 ENCOUNTER — Encounter (HOSPITAL_BASED_OUTPATIENT_CLINIC_OR_DEPARTMENT_OTHER): Payer: Self-pay

## 2023-02-09 ENCOUNTER — Emergency Department (HOSPITAL_BASED_OUTPATIENT_CLINIC_OR_DEPARTMENT_OTHER)
Admission: EM | Admit: 2023-02-09 | Discharge: 2023-02-09 | Disposition: A | Discharge status: Home / Self Care | Payer: Medicare HMO | Attending: Emergency Medicine | Admitting: Emergency Medicine

## 2023-02-09 DIAGNOSIS — S86819A Strain of other muscle(s) and tendon(s) at lower leg level, unspecified leg, initial encounter: Secondary | ICD-10-CM

## 2023-02-09 DIAGNOSIS — Z7902 Long term (current) use of antithrombotics/antiplatelets: Secondary | ICD-10-CM | POA: Diagnosis not present

## 2023-02-09 DIAGNOSIS — M79604 Pain in right leg: Secondary | ICD-10-CM | POA: Diagnosis not present

## 2023-02-09 DIAGNOSIS — W268XXA Contact with other sharp object(s), not elsewhere classified, initial encounter: Secondary | ICD-10-CM | POA: Diagnosis not present

## 2023-02-09 DIAGNOSIS — Z992 Dependence on renal dialysis: Secondary | ICD-10-CM | POA: Diagnosis not present

## 2023-02-09 DIAGNOSIS — Z79899 Other long term (current) drug therapy: Secondary | ICD-10-CM | POA: Diagnosis not present

## 2023-02-09 DIAGNOSIS — S86911A Strain of unspecified muscle(s) and tendon(s) at lower leg level, right leg, initial encounter: Secondary | ICD-10-CM | POA: Diagnosis not present

## 2023-02-09 DIAGNOSIS — N186 End stage renal disease: Secondary | ICD-10-CM | POA: Diagnosis not present

## 2023-02-09 DIAGNOSIS — S8991XA Unspecified injury of right lower leg, initial encounter: Secondary | ICD-10-CM | POA: Diagnosis not present

## 2023-02-09 NOTE — Discharge Instructions (Addendum)
Your x-ray did not show any broken bones or any signs of deep space infection.  Please follow-up with your family doctor in the office.  Have also given you information to follow-up with sports medicine clinic in the office.  Please return for rapid spreading redness or if you develop a fever.  I have given you crutches to try and keep your weight off of it.

## 2023-02-09 NOTE — ED Provider Notes (Signed)
[facility] Provider Note   CSN: [id number] Arrival date & time: 02/09/23  0840     History  Chief Complaint  Patient presents with   Leg Pain    Derrick Moore is a 57 y.o. male.  57 yo M with a chief complaint of right calf pain.  The patient had bumped it against the bed frame about a week or so ago.  He has been seen multiple times now, DVT study in the emergency department.  Saw his PCP a few days ago.  Tells me that he still has significant pain.  Worse to the lateral aspect of the calf.  He denies recurrent trauma and denies redness denies fevers.   Leg Pain      Home Medications Prior to Admission medications   Medication Sig Start Date End Date Taking? Authorizing Provider  acetaminophen (TYLENOL) 500 MG tablet Take 500 mg by mouth every 6 (six) hours as needed for mild pain or moderate pain.    [provider]  amLODipine (NORVASC) 5 MG tablet Take 5 mg by mouth at bedtime.    [provider]  cinacalcet (SENSIPAR) 60 MG tablet Take 60 mg by mouth daily.    [provider]  clopidogrel (PLAVIX) 75 MG tablet Take 1 tablet (75 mg total) by mouth daily. 07/09/22   Hughie Closs, MD  cyclobenzaprine (FLEXERIL) 5 MG tablet Take 1 tablet (5 mg total) by mouth 3 (three) times daily as needed for muscle spasms. 02/04/23   [name], NP  doxycycline (VIBRAMYCIN) 100 MG capsule Take 1 capsule (100 mg total) by mouth 2 (two) times daily. 01/22/23   [name], PA-C  furosemide (LASIX) 40 MG tablet Take 100 mg by mouth 2 (two) times daily. 09/10/22   [provider]  HYDROcodone-acetaminophen (NORCO/VICODIN) 5-325 MG tablet Take 1 tablet by mouth every 6 (six) hours as needed for severe pain. 12/30/22   Henderly, [name], PA-C  labetalol (NORMODYNE) 200 MG tablet Take 200 mg by mouth 2 (two) times daily.    [provider]  lidocaine (LIDODERM) 5 % Place 1 patch onto the skin daily.  Remove & Discard patch within 12 hours or as directed by MD 02/12/22   Virgina Norfolk, DO  lisinopril (ZESTRIL) 30 MG tablet Take 30 mg by mouth at bedtime. 07/04/20   [provider]  pravastatin (PRAVACHOL) 20 MG tablet Take by mouth. 05/19/19   [provider]  sodium polystyrene (KAYEXALATE) 15 GM/60ML suspension Take 60 ml by mouth daily for 2 days. 03/10/22   Saguier, [name], PA-C  Vitamin D, Ergocalciferol, (DRISDOL) 1.25 MG (50000 UNIT) CAPS capsule Take 50,000 Units by mouth every 30 (thirty) days. 09/18/22   [provider]      Allergies    Patient has no known allergies.    Review of Systems   Review of Systems  Physical Exam Updated Vital Signs BP (!) 163/114 (BP Location: Right Arm)   Pulse (!) 106   Temp 98.1 F (36.7 C) (Oral)   Resp 16   Ht 5\' 10"  (1.778 m)   Wt 57.9 kg   SpO2 99%   BMI 18.31 kg/m  Physical Exam Vitals and nursing note reviewed.  Constitutional:      Appearance: He is well-developed.  HENT:     Head: Normocephalic and atraumatic.  Eyes:     Pupils: Pupils are equal, round, and reactive to light.  Neck:  Vascular: No JVD.  Cardiovascular:     Rate and Rhythm: Normal rate and regular rhythm.     Heart sounds: No murmur heard.    No friction rub. No gallop.  Pulmonary:     Effort: No respiratory distress.     Breath sounds: No wheezing.  Abdominal:     General: There is no distension.     Tenderness: There is no abdominal tenderness. There is no guarding or rebound.  Musculoskeletal:        General: Tenderness present. Normal range of motion.     Cervical back: Normal range of motion and neck supple.     Comments: Patient has a small scab to the posterior aspect of the right lower leg.  There is no obvious fluctuance erythema or induration.  He does have pain out of proportion to palpation.  Intact pulse motor and sensation.  Skin:    Coloration: Skin is not pale.     Findings: No rash.  Neurological:      Mental Status: He is alert and oriented to person, place, and time.  Psychiatric:        Behavior: Behavior normal.     ED Results / Procedures / Treatments   Labs (all labs ordered are listed, but only abnormal results are displayed) Labs Reviewed - No data to display  EKG None  Radiology DG Tibia/Fibula Right  Result Date: 02/09/2023 CLINICAL DATA:  leg pain EXAM: RIGHT TIBIA AND FIBULA - 2 VIEW COMPARISON:  None Available. FINDINGS: No acute fracture or dislocation. No aggressive osseous lesion. The knee joint appears within normal limits. No significant arthritis. Ankle mortise appears intact. No focal soft tissue swelling. Extensive vascular calcifications noted. No radiopaque foreign bodies. IMPRESSION: 1. Negative. Electronically Signed   By: Jules Schick M.D.   On: 02/09/2023 09:45    Procedures Procedures    Medications Ordered in ED Medications - No data to display  ED Course/ Medical Decision Making/ A&P                             Medical Decision Making Amount and/or Complexity of Data Reviewed Radiology: ordered.   57 yo M with a chief complaints of right lower leg pain.  This occurred after he had bumped his leg against the bed frame.  He does have much more pain to the leg that anticipate though has no obvious other significant finding on physical exam.  Will obtain a screening plain film to assess for signs of necrotizing fasciitis.  If negative would suspect likely muscular strain and will have him follow-up with sports medicine in the office.  Plain film of the right tib-fib area independently interpreted by me without fracture, no subcutaneous edema.  PCP and sports medicine follow-up.  9:52 AM:  I have discussed the diagnosis/risks/treatment options with the patient.  Evaluation and diagnostic testing in the emergency department does not suggest an emergent condition requiring admission or immediate intervention beyond what has been performed at this  time.  They will follow up with PCP, sports med. We also discussed returning to the ED immediately if new or worsening sx occur. We discussed the sx which are most concerning (e.g., sudden worsening pain, fever, inability to tolerate by mouth) that necessitate immediate return. Medications administered to the patient during their visit and any new prescriptions provided to the patient are listed below.  Medications given during this visit Medications - No data to  display   The patient appears reasonably screen and/or stabilized for discharge and I doubt any other medical condition or other [facility] requiring further screening, evaluation, or treatment in the ED at this time prior to discharge.          Final Clinical Impression(s) / ED Diagnoses Final diagnoses:  Strain of calf muscle, initial encounter    Rx / DC Orders ED Discharge Orders     None         Melene Plan, DO 02/09/23 [phone]

## 2023-02-09 NOTE — ED Triage Notes (Signed)
States he hit his right calf a few weeks ago on a bed frame and has been having pain since. Been seen in ED, Urgent Care and PCP. States sensitive to touch.

## 2023-02-10 ENCOUNTER — Ambulatory Visit: Payer: Medicare HMO | Admitting: Medical

## 2023-02-10 DIAGNOSIS — Z992 Dependence on renal dialysis: Secondary | ICD-10-CM | POA: Diagnosis not present

## 2023-02-10 DIAGNOSIS — N186 End stage renal disease: Secondary | ICD-10-CM | POA: Diagnosis not present

## 2023-02-11 DIAGNOSIS — N186 End stage renal disease: Secondary | ICD-10-CM | POA: Diagnosis not present

## 2023-02-11 DIAGNOSIS — Z992 Dependence on renal dialysis: Secondary | ICD-10-CM | POA: Diagnosis not present

## 2023-02-12 DIAGNOSIS — Z992 Dependence on renal dialysis: Secondary | ICD-10-CM | POA: Diagnosis not present

## 2023-02-12 DIAGNOSIS — N186 End stage renal disease: Secondary | ICD-10-CM | POA: Diagnosis not present

## 2023-02-13 DIAGNOSIS — N186 End stage renal disease: Secondary | ICD-10-CM | POA: Diagnosis not present

## 2023-02-13 DIAGNOSIS — Z992 Dependence on renal dialysis: Secondary | ICD-10-CM | POA: Diagnosis not present

## 2023-02-14 DIAGNOSIS — N186 End stage renal disease: Secondary | ICD-10-CM | POA: Diagnosis not present

## 2023-02-14 DIAGNOSIS — Z992 Dependence on renal dialysis: Secondary | ICD-10-CM | POA: Diagnosis not present

## 2023-02-15 DIAGNOSIS — N186 End stage renal disease: Secondary | ICD-10-CM | POA: Diagnosis not present

## 2023-02-15 DIAGNOSIS — Z992 Dependence on renal dialysis: Secondary | ICD-10-CM | POA: Diagnosis not present

## 2023-02-16 DIAGNOSIS — N186 End stage renal disease: Secondary | ICD-10-CM | POA: Diagnosis not present

## 2023-02-16 DIAGNOSIS — Z992 Dependence on renal dialysis: Secondary | ICD-10-CM | POA: Diagnosis not present

## 2023-02-17 DIAGNOSIS — N186 End stage renal disease: Secondary | ICD-10-CM | POA: Diagnosis not present

## 2023-02-17 DIAGNOSIS — Z992 Dependence on renal dialysis: Secondary | ICD-10-CM | POA: Diagnosis not present

## 2023-02-18 DIAGNOSIS — N186 End stage renal disease: Secondary | ICD-10-CM | POA: Diagnosis not present

## 2023-02-18 DIAGNOSIS — Z992 Dependence on renal dialysis: Secondary | ICD-10-CM | POA: Diagnosis not present

## 2023-02-19 ENCOUNTER — Encounter: Payer: Self-pay | Admitting: Internal Medicine

## 2023-02-19 ENCOUNTER — Ambulatory Visit: Payer: Medicare HMO | Attending: Internal Medicine | Admitting: Internal Medicine

## 2023-02-19 DIAGNOSIS — N186 End stage renal disease: Secondary | ICD-10-CM | POA: Diagnosis not present

## 2023-02-19 DIAGNOSIS — Z992 Dependence on renal dialysis: Secondary | ICD-10-CM | POA: Diagnosis not present

## 2023-02-19 NOTE — Progress Notes (Deleted)
Cardiology Office Note:    Date:  02/19/2023   ID:  Derrick Moore, DOB [birth year], MRN [id number]  PCP:  Marisue Brooklyn   [facility] Providers Cardiologist:  None { Click to update primary MD,subspecialty MD or APP then REFRESH:1}    Referring MD: Esperanza Richters, PA-C   No chief complaint on file. ***  History of Present Illness:    Derrick Moore is a 57 y.o. male with a hx of HTN, ESRD on peritoneal dialysis, CVA on plavix referral for a murmur from his primary provider.   Past Medical History:  Diagnosis Date   Dialysis patient [facility])    Hyperlipidemia    Hypertension    Renal failure    Renal failure    Stroke (HCC)     No past surgical history on file.  Current Medications: No outpatient medications have been marked as taking for the 02/19/23 encounter (Appointment) with Maisie Fus, MD.     Allergies:   Patient has no known allergies.   Social History   Socioeconomic History   Marital status: Divorced    Spouse name: Not on file   Number of children: Not on file   Years of education: Not on file   Highest education level: Some college, no degree  Occupational History   Not on file  Tobacco Use   Smoking status: Every Day    Current packs/day: 0.25    Average packs/day: 0.3 packs/day for 33.0 years (8.3 ttl pk-yrs)    Types: Cigarettes   Smokeless tobacco: Never  Vaping Use   Vaping status: Never Used  Substance and Sexual Activity   Alcohol use: Not Currently   Drug use: Yes    Types: Marijuana   Sexual activity: Not on file  Other Topics Concern   Not on file  Social History Narrative   Not on file   Social Determinants of Health   Financial Resource Strain: Low Risk  (12/30/2022)   Overall Financial Resource Strain (CARDIA)    Difficulty of Paying Living Expenses: Not hard at all  Food Insecurity: Food Insecurity Present (12/30/2022)   Hunger Vital Sign    Worried About Running Out of Food in the Last Year:  Sometimes true    Ran Out of Food in the Last Year: Never true  Transportation Needs: No Transportation Needs (12/30/2022)   PRAPARE - Administrator, Civil Service (Medical): No    Lack of Transportation (Non-Medical): No  Physical Activity: Insufficiently Active (12/30/2022)   Exercise Vital Sign    Days of Exercise per Week: 3 days    Minutes of Exercise per Session: 30 min  Stress: No Stress Concern Present (12/30/2022)   [employer] of Occupational Health - Occupational Stress Questionnaire    Feeling of Stress : Only a little  Social Connections: Socially Isolated (12/30/2022)   Social Connection and Isolation Panel [NHANES]    Frequency of Communication with Friends and Family: Twice a week    Frequency of Social Gatherings with Friends and Family: Once a week    Attends Religious Services: Never    Database administrator or Organizations: No    Attends Engineer, structural: Never    Marital Status: Divorced     Family History: The patient's ***family history is not on file.  ROS:   Please see the history of present illness.    *** All other systems reviewed and are negative.  EKGs/Labs/Other Studies Reviewed:  The following studies were reviewed today: ***      Recent Labs: 07/17/2022: Magnesium 1.7 01/02/2023: ALT 6; BUN 81; Creat 15.37; Hemoglobin 9.7; Platelets 258; Potassium 4.7; Sodium 137  Recent Lipid Panel    Component Value Date/Time   CHOL 163 03/06/2022 1132   TRIG 75.0 03/06/2022 1132   HDL 61.60 03/06/2022 1132   CHOLHDL 3 03/06/2022 1132   VLDL 15.0 03/06/2022 1132   LDLCALC 87 03/06/2022 1132     Risk Assessment/Calculations:   {Does this patient have ATRIAL FIBRILLATION?:[phone]}  No BP recorded.  {Refresh Note OR Click here to enter BP  :1}***         Physical Exam:    VS:  There were no vitals taken for this visit.    Wt Readings from Last 3 Encounters:  02/09/23 127 lb 9.6 oz (57.9 kg)  02/04/23 127 lb 9.6  oz (57.9 kg)  01/02/23 134 lb (60.8 kg)     GEN: *** Well nourished, well developed in no acute distress HEENT: Normal NECK: No JVD; No carotid bruits LYMPHATICS: No lymphadenopathy CARDIAC: ***RRR, no murmurs, rubs, gallops RESPIRATORY:  Clear to auscultation without rales, wheezing or rhonchi  ABDOMEN: Soft, non-tender, non-distended MUSCULOSKELETAL:  No edema; No deformity  SKIN: Warm and dry NEUROLOGIC:  Alert and oriented x 3 PSYCHIATRIC:  Normal affect   ASSESSMENT:    No diagnosis found. PLAN:    In order of problems listed above:  ***      {Are you ordering a CV Procedure (e.g. stress test, cath, DCCV, TEE, etc)?   Press F2        :[id number]}    Medication Adjustments/Labs and Tests Ordered: Current medicines are reviewed at length with the patient today.  Concerns regarding medicines are outlined above.  No orders of the defined types were placed in this encounter.  No orders of the defined types were placed in this encounter.   There are no Patient Instructions on file for this visit.   Signed, Maisie Fus, MD  02/19/2023 11:57 AM    [facility]

## 2023-02-20 DIAGNOSIS — N186 End stage renal disease: Secondary | ICD-10-CM | POA: Diagnosis not present

## 2023-02-20 DIAGNOSIS — Z992 Dependence on renal dialysis: Secondary | ICD-10-CM | POA: Diagnosis not present

## 2023-02-21 DIAGNOSIS — N186 End stage renal disease: Secondary | ICD-10-CM | POA: Diagnosis not present

## 2023-02-21 DIAGNOSIS — Z992 Dependence on renal dialysis: Secondary | ICD-10-CM | POA: Diagnosis not present

## 2023-02-22 DIAGNOSIS — N186 End stage renal disease: Secondary | ICD-10-CM | POA: Diagnosis not present

## 2023-02-22 DIAGNOSIS — Z992 Dependence on renal dialysis: Secondary | ICD-10-CM | POA: Diagnosis not present

## 2023-02-23 DIAGNOSIS — N186 End stage renal disease: Secondary | ICD-10-CM | POA: Diagnosis not present

## 2023-02-23 DIAGNOSIS — Z992 Dependence on renal dialysis: Secondary | ICD-10-CM | POA: Diagnosis not present

## 2023-02-24 DIAGNOSIS — N186 End stage renal disease: Secondary | ICD-10-CM | POA: Diagnosis not present

## 2023-02-24 DIAGNOSIS — Z992 Dependence on renal dialysis: Secondary | ICD-10-CM | POA: Diagnosis not present

## 2023-02-25 DIAGNOSIS — Z992 Dependence on renal dialysis: Secondary | ICD-10-CM | POA: Diagnosis not present

## 2023-02-25 DIAGNOSIS — N186 End stage renal disease: Secondary | ICD-10-CM | POA: Diagnosis not present

## 2023-02-26 DIAGNOSIS — N186 End stage renal disease: Secondary | ICD-10-CM | POA: Diagnosis not present

## 2023-02-26 DIAGNOSIS — Z992 Dependence on renal dialysis: Secondary | ICD-10-CM | POA: Diagnosis not present

## 2023-02-27 DIAGNOSIS — N186 End stage renal disease: Secondary | ICD-10-CM | POA: Diagnosis not present

## 2023-02-27 DIAGNOSIS — Z992 Dependence on renal dialysis: Secondary | ICD-10-CM | POA: Diagnosis not present

## 2023-02-28 DIAGNOSIS — N186 End stage renal disease: Secondary | ICD-10-CM | POA: Diagnosis not present

## 2023-02-28 DIAGNOSIS — Z992 Dependence on renal dialysis: Secondary | ICD-10-CM | POA: Diagnosis not present

## 2023-03-01 DIAGNOSIS — Z992 Dependence on renal dialysis: Secondary | ICD-10-CM | POA: Diagnosis not present

## 2023-03-01 DIAGNOSIS — N186 End stage renal disease: Secondary | ICD-10-CM | POA: Diagnosis not present

## 2023-03-02 DIAGNOSIS — Z992 Dependence on renal dialysis: Secondary | ICD-10-CM | POA: Diagnosis not present

## 2023-03-02 DIAGNOSIS — N186 End stage renal disease: Secondary | ICD-10-CM | POA: Diagnosis not present

## 2023-03-03 ENCOUNTER — Emergency Department (HOSPITAL_BASED_OUTPATIENT_CLINIC_OR_DEPARTMENT_OTHER)
Admission: EM | Admit: 2023-03-03 | Discharge: 2023-03-03 | Disposition: A | Discharge status: Home / Self Care | Payer: Medicare HMO | Source: Home / Self Care | Attending: Emergency Medicine | Admitting: Emergency Medicine

## 2023-03-03 ENCOUNTER — Emergency Department (HOSPITAL_BASED_OUTPATIENT_CLINIC_OR_DEPARTMENT_OTHER): Payer: Medicare HMO

## 2023-03-03 ENCOUNTER — Encounter (HOSPITAL_BASED_OUTPATIENT_CLINIC_OR_DEPARTMENT_OTHER): Payer: Self-pay | Admitting: Emergency Medicine

## 2023-03-03 DIAGNOSIS — W228XXA Striking against or struck by other objects, initial encounter: Secondary | ICD-10-CM | POA: Diagnosis not present

## 2023-03-03 DIAGNOSIS — N186 End stage renal disease: Secondary | ICD-10-CM | POA: Insufficient documentation

## 2023-03-03 DIAGNOSIS — M79604 Pain in right leg: Secondary | ICD-10-CM | POA: Diagnosis not present

## 2023-03-03 DIAGNOSIS — Z7902 Long term (current) use of antithrombotics/antiplatelets: Secondary | ICD-10-CM | POA: Insufficient documentation

## 2023-03-03 DIAGNOSIS — Z992 Dependence on renal dialysis: Secondary | ICD-10-CM | POA: Insufficient documentation

## 2023-03-03 DIAGNOSIS — S81801A Unspecified open wound, right lower leg, initial encounter: Secondary | ICD-10-CM | POA: Diagnosis not present

## 2023-03-03 MED ORDER — HYDROCODONE-ACETAMINOPHEN 5-325 MG PO TABS: 2.0000 | ORAL_TABLET | ORAL | 0 refills | Status: DC | PRN

## 2023-03-03 MED ORDER — HYDROCODONE-ACETAMINOPHEN 5-325 MG PO TABS
1.0000 | ORAL_TABLET | Freq: Once | ORAL | Status: AC
  Administered 2023-03-03: 1 via ORAL
  Filled 2023-03-03: qty 1

## 2023-03-03 MED ORDER — DOXYCYCLINE HYCLATE 100 MG PO CAPS: 100.0000 mg | ORAL_CAPSULE | Freq: Two times a day (BID) | ORAL | 0 refills | Status: DC

## 2023-03-03 NOTE — ED Notes (Signed)
Discharge paperwork reviewed entirely with patient, including follow up care. Pain was under control. The patient received instruction and coaching on their prescriptions, and all follow-up questions were answered.  Pt verbalized understanding as well as all parties involved. No questions or concerns voiced at the time of discharge. No acute distress noted.   The pt used his rolling walker and exited the building with his mother assisting him, without incident.

## 2023-03-03 NOTE — ED Notes (Signed)
Not in room, pt in radiology  

## 2023-03-03 NOTE — ED Triage Notes (Signed)
Pt POV in wheelchair- c/o right leg pain x2 weeks after injury on bedframe. Reports pain is still present, concerned for infection.

## 2023-03-03 NOTE — Discharge Instructions (Signed)
As we discussed, you need to see a wound care specialist for management of the wound on your leg.  I have placed a referral for this and they should call you in 72 hours.  If they do not then you will need to call them to schedule an appointment.  I have also given you prescription for antibiotics and pain medication for you to take.  Please take the antibiotics as prescribed in its entirety.  Please take the pain medication as needed for severe pain only and do not drive or operate heavy machinery while taking it.  Follow-up with your primary doctor as well.  In the interim, please stop all use of hydrogen peroxide as it is terrible for wound healing.  Instead, please clean your wound with soap and water regularly and use some sort of lubricating barrier such as Neosporin, Vaseline, or Aquaphor on the area and then place a bandage over it.  Return if development of any new or worsening symptoms.

## 2023-03-03 NOTE — ED Provider Notes (Signed)
[place] [facility] AT [facility] Provider Note   CSN: [id number] Arrival date & time: 03/03/23  1424     History  Chief Complaint  Patient presents with   Leg Pain    Derrick Moore is a 57 y.o. male.  Patient with history of ESRD on peritoneal dialysis, retention presents today with complaints of left leg wound.  States that he first noticed same approximately 1 month ago when he hit his leg on a door frame.  States that the wound was originally very small.  He went to urgent care and got antibiotics which she took in its entirety.  He is unsure if this helped.  He has been to the emergency department several times for this but states that his wound continues to get worse.  He has not seen any outpatient specialist including Ortho or wound care for this.  He has been cleaning his wound with hydrogen peroxide regularly.  States that the wound is so painful that he has difficulty walking and even driving.  Denies fevers or chills.  The wound is not draining.  He is not diabetic.  The history is provided by the patient. No language interpreter was used.  Leg Pain      Home Medications Prior to Admission medications   Medication Sig Start Date End Date Taking? Authorizing Provider  acetaminophen (TYLENOL) 500 MG tablet Take 500 mg by mouth every 6 (six) hours as needed for mild pain or moderate pain.    [provider]  amLODipine (NORVASC) 5 MG tablet Take 5 mg by mouth at bedtime.    [provider]  cinacalcet (SENSIPAR) 60 MG tablet Take 60 mg by mouth daily.    [provider]  clopidogrel (PLAVIX) 75 MG tablet Take 1 tablet (75 mg total) by mouth daily. 07/09/22   Hughie Closs, MD  cyclobenzaprine (FLEXERIL) 5 MG tablet Take 1 tablet (5 mg total) by mouth 3 (three) times daily as needed for muscle spasms. 02/04/23   [name], NP  doxycycline (VIBRAMYCIN) 100 MG capsule Take 1 capsule (100 mg total) by mouth 2 (two)  times daily. 01/22/23   [name], PA-C  furosemide (LASIX) 40 MG tablet Take 100 mg by mouth 2 (two) times daily. 09/10/22   [provider]  HYDROcodone-acetaminophen (NORCO/VICODIN) 5-325 MG tablet Take 1 tablet by mouth every 6 (six) hours as needed for severe pain. 12/30/22   Henderly, [name], PA-C  labetalol (NORMODYNE) 200 MG tablet Take 200 mg by mouth 2 (two) times daily.    [provider]  lidocaine (LIDODERM) 5 % Place 1 patch onto the skin daily. Remove & Discard patch within 12 hours or as directed by MD 02/12/22   Virgina Norfolk, DO  lisinopril (ZESTRIL) 30 MG tablet Take 30 mg by mouth at bedtime. 07/04/20   [provider]  pravastatin (PRAVACHOL) 20 MG tablet Take by mouth. 05/19/19   [provider]  sodium polystyrene (KAYEXALATE) 15 GM/60ML suspension Take 60 ml by mouth daily for 2 days. 03/10/22   Saguier, [name], PA-C  Vitamin D, Ergocalciferol, (DRISDOL) 1.25 MG (50000 UNIT) CAPS capsule Take 50,000 Units by mouth every 30 (thirty) days. 09/18/22   [provider]      Allergies    Patient has no known allergies.    Review of Systems   Review of Systems  Skin:  Positive for wound.  All other systems reviewed and are negative.   Physical Exam  Updated Vital Signs BP 127/85 (BP Location: Right Arm)   Pulse (!) 101   Temp 97.8 F (36.6 C)   Resp 18   Ht 5\' 10"  (1.778 m)   Wt 68 kg   SpO2 99%   BMI 21.52 kg/m  Physical Exam Vitals and nursing note reviewed.  Constitutional:      General: He is not in acute distress.    Appearance: Normal appearance. He is normal weight. He is not ill-appearing, toxic-appearing or diaphoretic.  HENT:     Head: Normocephalic and atraumatic.  Cardiovascular:     Rate and Rhythm: Normal rate.  Pulmonary:     Effort: Pulmonary effort is normal. No respiratory distress.  Musculoskeletal:        General: Normal range of motion.     Cervical back: Normal range of motion.  Skin:     General: Skin is warm and dry.     Comments: Discoloration of the skin of the right calf with significant tenderness to palpation.  No drainage.  Mildly warm.  DP and PT pulses intact distally.  ROM intact.  See images below for further  Neurological:     General: No focal deficit present.     Mental Status: He is alert.  Psychiatric:        Mood and Affect: Mood normal.        Behavior: Behavior normal.     ED Results / Procedures / Treatments   Labs (all labs ordered are listed, but only abnormal results are displayed) Labs Reviewed - No data to display  EKG None  Radiology US Venous Img Lower Right (DVT Study)  Result Date: 03/03/2023 CLINICAL DATA:  Right leg pain EXAM: Right LOWER EXTREMITY VENOUS DOPPLER ULTRASOUND TECHNIQUE: Gray-scale sonography with compression, as well as color and duplex ultrasound, were performed to evaluate the deep venous system(s) from the level of the common femoral vein through the popliteal and proximal calf veins. COMPARISON:  02/02/2023 ultrasound FINDINGS: VENOUS Normal compressibility of the common femoral, superficial femoral, and popliteal veins. Visualized portions of profunda femoral vein and great saphenous vein unremarkable. No filling defects to suggest DVT on grayscale or color Doppler imaging. Doppler waveforms show normal direction of venous flow, normal respiratory plasticity and response to augmentation. Only exception is incomplete compression of the peroneal vein distally. An area of below-the-knee thrombus is possible. This areas incompletely visualized. Limited views of the contralateral common femoral vein are unremarkable. OTHER None. Limitations: none IMPRESSION: Incomplete compression of the peroneal vein distally. Focal below-the-knee nonocclusive thrombus is possible. This areas incompletely visualized as per the sonographer. No DVT seen above the knee. Electronically Signed   By: Karen Kays M.D.   On: 03/03/2023 16:13   DG  Tibia/Fibula Right  Result Date: 03/03/2023 CLINICAL DATA:  Right leg pain EXAM: RIGHT TIBIA AND FIBULA - 2 VIEW COMPARISON:  X-ray 02/09/2023 FINDINGS: There is no evidence of fracture or other focal bone lesions. Extensive soft tissue vascular calcifications. IMPRESSION: No acute osseous abnormality Electronically Signed   By: Karen Kays M.D.   On: 03/03/2023 16:11    Procedures Procedures    Medications Ordered in ED Medications  HYDROcodone-acetaminophen (NORCO/VICODIN) 5-325 MG per tablet 1 tablet (has no administration in time range)    ED Course/ Medical Decision Making/ A&P  Medical Decision Making Amount and/or Complexity of Data Reviewed Radiology: ordered.  Risk Prescription drug management.   Patient presents today with complaints of right calf wound.  He is afebrile, nontoxic-appearing, and in no acute distress with reassuring vital signs.  Physical exam reveals images per above.  There is discoloration in the right calf area with some warmth without erythema, fluctuance, or induration.  No drainage.  Good distal pulses and sensation.  Chart reviewed, patient has been here several times for this before it does appear on previous images that this area is expanding.  DVT ultrasound and x-ray previously the last being almost a month ago.  Will repeat for reevaluation given progression of wound.  Repeat x-ray and [place] have resulted and reveal  NAD, DVT inconclusive due to patients pain.   I have personally reviewed and interpreted this imaging and agree with radiology interpretation.  Patient's wound may have some cellulitic changes, will treat for same with a second course of doxycycline and pain meds. PDMP reviewed.  Patient advised not to drive or operate heavy machinery within this medication.  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  I have emphasized the  importance of not using peroxide anymore and wound care instructions given.  Patient is not septic. Evaluation and diagnostic testing in the emergency department does not suggest an emergent condition requiring admission or immediate intervention beyond what has been performed at this time.  Plan for discharge with close PCP follow-up.  Patient is understanding and amenable with plan, educated on red flag symptoms that would prompt immediate return.  Patient discharged in stable condition.   Findings and plan of care discussed with supervising physician Dr. Charm Barges who is in agreement.   Final Clinical Impression(s) / ED Diagnoses Final diagnoses:  Wound of right lower extremity, initial encounter    Rx / DC Orders ED Discharge Orders          Ordered    HYDROcodone-acetaminophen (NORCO/VICODIN) 5-325 MG tablet  Every 4 hours PRN        03/03/23 1703    Ambulatory referral to Wound Clinic        03/03/23 1703    doxycycline (VIBRAMYCIN) 100 MG capsule  2 times daily        03/03/23 1703          An After Visit Summary was printed and given to the patient.     Vear Clock 03/03/23 1709    Terrilee Files, MD 03/03/23 [phone]

## 2023-03-04 DIAGNOSIS — Z992 Dependence on renal dialysis: Secondary | ICD-10-CM | POA: Diagnosis not present

## 2023-03-04 DIAGNOSIS — N186 End stage renal disease: Secondary | ICD-10-CM | POA: Diagnosis not present

## 2023-03-05 DIAGNOSIS — N186 End stage renal disease: Secondary | ICD-10-CM | POA: Diagnosis not present

## 2023-03-05 DIAGNOSIS — Z992 Dependence on renal dialysis: Secondary | ICD-10-CM | POA: Diagnosis not present

## 2023-03-06 DIAGNOSIS — Z992 Dependence on renal dialysis: Secondary | ICD-10-CM | POA: Diagnosis not present

## 2023-03-06 DIAGNOSIS — N186 End stage renal disease: Secondary | ICD-10-CM | POA: Diagnosis not present

## 2023-03-07 DIAGNOSIS — N186 End stage renal disease: Secondary | ICD-10-CM | POA: Diagnosis not present

## 2023-03-07 DIAGNOSIS — Z992 Dependence on renal dialysis: Secondary | ICD-10-CM | POA: Diagnosis not present

## 2023-03-08 DIAGNOSIS — N186 End stage renal disease: Secondary | ICD-10-CM | POA: Diagnosis not present

## 2023-03-08 DIAGNOSIS — Z992 Dependence on renal dialysis: Secondary | ICD-10-CM | POA: Diagnosis not present

## 2023-03-09 DIAGNOSIS — Z992 Dependence on renal dialysis: Secondary | ICD-10-CM | POA: Diagnosis not present

## 2023-03-09 DIAGNOSIS — N186 End stage renal disease: Secondary | ICD-10-CM | POA: Diagnosis not present

## 2023-03-10 DIAGNOSIS — N186 End stage renal disease: Secondary | ICD-10-CM | POA: Diagnosis not present

## 2023-03-10 DIAGNOSIS — Z992 Dependence on renal dialysis: Secondary | ICD-10-CM | POA: Diagnosis not present

## 2023-03-11 DIAGNOSIS — N186 End stage renal disease: Secondary | ICD-10-CM | POA: Diagnosis not present

## 2023-03-11 DIAGNOSIS — Z992 Dependence on renal dialysis: Secondary | ICD-10-CM | POA: Diagnosis not present

## 2023-03-12 DIAGNOSIS — Z992 Dependence on renal dialysis: Secondary | ICD-10-CM | POA: Diagnosis not present

## 2023-03-12 DIAGNOSIS — N186 End stage renal disease: Secondary | ICD-10-CM | POA: Diagnosis not present

## 2023-03-13 DIAGNOSIS — Z992 Dependence on renal dialysis: Secondary | ICD-10-CM | POA: Diagnosis not present

## 2023-03-13 DIAGNOSIS — N186 End stage renal disease: Secondary | ICD-10-CM | POA: Diagnosis not present

## 2023-03-14 DIAGNOSIS — Z992 Dependence on renal dialysis: Secondary | ICD-10-CM | POA: Diagnosis not present

## 2023-03-14 DIAGNOSIS — N186 End stage renal disease: Secondary | ICD-10-CM | POA: Diagnosis not present

## 2023-03-15 DIAGNOSIS — N186 End stage renal disease: Secondary | ICD-10-CM | POA: Diagnosis not present

## 2023-03-15 DIAGNOSIS — Z992 Dependence on renal dialysis: Secondary | ICD-10-CM | POA: Diagnosis not present

## 2023-03-16 DIAGNOSIS — E8729 Other acidosis: Secondary | ICD-10-CM | POA: Diagnosis not present

## 2023-03-16 DIAGNOSIS — G459 Transient cerebral ischemic attack, unspecified: Secondary | ICD-10-CM | POA: Diagnosis not present

## 2023-03-16 DIAGNOSIS — I120 Hypertensive chronic kidney disease with stage 5 chronic kidney disease or end stage renal disease: Secondary | ICD-10-CM | POA: Diagnosis not present

## 2023-03-16 DIAGNOSIS — R000 Tachycardia, unspecified: Secondary | ICD-10-CM | POA: Diagnosis not present

## 2023-03-16 DIAGNOSIS — Z4901 Encounter for fitting and adjustment of extracorporeal dialysis catheter: Secondary | ICD-10-CM | POA: Diagnosis not present

## 2023-03-16 DIAGNOSIS — K644 Residual hemorrhoidal skin tags: Secondary | ICD-10-CM | POA: Diagnosis not present

## 2023-03-16 DIAGNOSIS — N189 Chronic kidney disease, unspecified: Secondary | ICD-10-CM | POA: Diagnosis not present

## 2023-03-16 DIAGNOSIS — L03116 Cellulitis of left lower limb: Secondary | ICD-10-CM | POA: Diagnosis not present

## 2023-03-16 DIAGNOSIS — Z91198 Patient's noncompliance with other medical treatment and regimen for other reason: Secondary | ICD-10-CM | POA: Diagnosis not present

## 2023-03-16 DIAGNOSIS — D631 Anemia in chronic kidney disease: Secondary | ICD-10-CM | POA: Diagnosis not present

## 2023-03-16 DIAGNOSIS — G319 Degenerative disease of nervous system, unspecified: Secondary | ICD-10-CM | POA: Diagnosis not present

## 2023-03-16 DIAGNOSIS — Z79899 Other long term (current) drug therapy: Secondary | ICD-10-CM | POA: Diagnosis not present

## 2023-03-16 DIAGNOSIS — S86999A Other injury of unspecified muscle(s) and tendon(s) at lower leg level, unspecified leg, initial encounter: Secondary | ICD-10-CM | POA: Diagnosis not present

## 2023-03-16 DIAGNOSIS — N186 End stage renal disease: Secondary | ICD-10-CM | POA: Diagnosis not present

## 2023-03-16 DIAGNOSIS — R4182 Altered mental status, unspecified: Secondary | ICD-10-CM | POA: Diagnosis not present

## 2023-03-16 DIAGNOSIS — I6782 Cerebral ischemia: Secondary | ICD-10-CM | POA: Diagnosis not present

## 2023-03-16 DIAGNOSIS — Z992 Dependence on renal dialysis: Secondary | ICD-10-CM | POA: Diagnosis not present

## 2023-03-16 DIAGNOSIS — Z7902 Long term (current) use of antithrombotics/antiplatelets: Secondary | ICD-10-CM | POA: Diagnosis not present

## 2023-03-16 DIAGNOSIS — I7090 Unspecified atherosclerosis: Secondary | ICD-10-CM | POA: Diagnosis not present

## 2023-03-16 DIAGNOSIS — R9431 Abnormal electrocardiogram [ECG] [EKG]: Secondary | ICD-10-CM | POA: Diagnosis not present

## 2023-03-16 DIAGNOSIS — I129 Hypertensive chronic kidney disease with stage 1 through stage 4 chronic kidney disease, or unspecified chronic kidney disease: Secondary | ICD-10-CM | POA: Diagnosis not present

## 2023-03-16 DIAGNOSIS — Z8673 Personal history of transient ischemic attack (TIA), and cerebral infarction without residual deficits: Secondary | ICD-10-CM | POA: Diagnosis not present

## 2023-03-16 DIAGNOSIS — Z91158 Patient's noncompliance with renal dialysis for other reason: Secondary | ICD-10-CM | POA: Diagnosis not present

## 2023-03-16 DIAGNOSIS — Z743 Need for continuous supervision: Secondary | ICD-10-CM | POA: Diagnosis not present

## 2023-03-16 DIAGNOSIS — L03115 Cellulitis of right lower limb: Secondary | ICD-10-CM | POA: Diagnosis not present

## 2023-03-16 DIAGNOSIS — E872 Acidosis, unspecified: Secondary | ICD-10-CM | POA: Diagnosis not present

## 2023-03-17 DIAGNOSIS — N186 End stage renal disease: Secondary | ICD-10-CM | POA: Diagnosis not present

## 2023-03-17 DIAGNOSIS — Z4901 Encounter for fitting and adjustment of extracorporeal dialysis catheter: Secondary | ICD-10-CM | POA: Diagnosis not present

## 2023-03-17 DIAGNOSIS — Z992 Dependence on renal dialysis: Secondary | ICD-10-CM | POA: Diagnosis not present

## 2023-03-19 DIAGNOSIS — G319 Degenerative disease of nervous system, unspecified: Secondary | ICD-10-CM | POA: Diagnosis not present

## 2023-03-19 DIAGNOSIS — I6782 Cerebral ischemia: Secondary | ICD-10-CM | POA: Diagnosis not present

## 2023-03-19 DIAGNOSIS — R4182 Altered mental status, unspecified: Secondary | ICD-10-CM | POA: Diagnosis not present

## 2023-03-20 DIAGNOSIS — Z743 Need for continuous supervision: Secondary | ICD-10-CM | POA: Diagnosis not present

## 2023-03-20 DIAGNOSIS — R58 Hemorrhage, not elsewhere classified: Secondary | ICD-10-CM | POA: Diagnosis not present

## 2023-03-23 DIAGNOSIS — N2581 Secondary hyperparathyroidism of renal origin: Secondary | ICD-10-CM | POA: Diagnosis not present

## 2023-03-23 DIAGNOSIS — R392 Extrarenal uremia: Secondary | ICD-10-CM | POA: Diagnosis not present

## 2023-03-23 DIAGNOSIS — Z23 Encounter for immunization: Secondary | ICD-10-CM | POA: Diagnosis not present

## 2023-03-23 DIAGNOSIS — T8249XA Other complication of vascular dialysis catheter, initial encounter: Secondary | ICD-10-CM | POA: Diagnosis not present

## 2023-03-23 DIAGNOSIS — R278 Other lack of coordination: Secondary | ICD-10-CM | POA: Diagnosis not present

## 2023-03-23 DIAGNOSIS — D509 Iron deficiency anemia, unspecified: Secondary | ICD-10-CM | POA: Diagnosis not present

## 2023-03-23 DIAGNOSIS — I739 Peripheral vascular disease, unspecified: Secondary | ICD-10-CM | POA: Diagnosis not present

## 2023-03-23 DIAGNOSIS — I10 Essential (primary) hypertension: Secondary | ICD-10-CM | POA: Diagnosis not present

## 2023-03-23 DIAGNOSIS — N186 End stage renal disease: Secondary | ICD-10-CM | POA: Diagnosis not present

## 2023-03-23 DIAGNOSIS — G8929 Other chronic pain: Secondary | ICD-10-CM | POA: Diagnosis not present

## 2023-03-23 DIAGNOSIS — N189 Chronic kidney disease, unspecified: Secondary | ICD-10-CM | POA: Diagnosis not present

## 2023-03-23 DIAGNOSIS — M6281 Muscle weakness (generalized): Secondary | ICD-10-CM | POA: Diagnosis not present

## 2023-03-23 DIAGNOSIS — Z992 Dependence on renal dialysis: Secondary | ICD-10-CM | POA: Diagnosis not present

## 2023-03-23 DIAGNOSIS — M6250 Muscle wasting and atrophy, not elsewhere classified, unspecified site: Secondary | ICD-10-CM | POA: Diagnosis not present

## 2023-03-23 DIAGNOSIS — Z8673 Personal history of transient ischemic attack (TIA), and cerebral infarction without residual deficits: Secondary | ICD-10-CM | POA: Diagnosis not present

## 2023-03-23 DIAGNOSIS — R296 Repeated falls: Secondary | ICD-10-CM | POA: Diagnosis not present

## 2023-03-23 DIAGNOSIS — M62838 Other muscle spasm: Secondary | ICD-10-CM | POA: Diagnosis not present

## 2023-03-23 DIAGNOSIS — K648 Other hemorrhoids: Secondary | ICD-10-CM | POA: Diagnosis not present

## 2023-03-23 DIAGNOSIS — R293 Abnormal posture: Secondary | ICD-10-CM | POA: Diagnosis not present

## 2023-03-23 DIAGNOSIS — L089 Local infection of the skin and subcutaneous tissue, unspecified: Secondary | ICD-10-CM | POA: Diagnosis not present

## 2023-03-23 DIAGNOSIS — N19 Unspecified kidney failure: Secondary | ICD-10-CM | POA: Diagnosis not present

## 2023-03-23 DIAGNOSIS — E8721 Acute metabolic acidosis: Secondary | ICD-10-CM | POA: Diagnosis not present

## 2023-03-25 DIAGNOSIS — D509 Iron deficiency anemia, unspecified: Secondary | ICD-10-CM | POA: Diagnosis not present

## 2023-03-25 DIAGNOSIS — M6250 Muscle wasting and atrophy, not elsewhere classified, unspecified site: Secondary | ICD-10-CM | POA: Diagnosis not present

## 2023-03-25 DIAGNOSIS — I10 Essential (primary) hypertension: Secondary | ICD-10-CM | POA: Diagnosis not present

## 2023-03-25 DIAGNOSIS — R296 Repeated falls: Secondary | ICD-10-CM | POA: Diagnosis not present

## 2023-03-25 DIAGNOSIS — Z8673 Personal history of transient ischemic attack (TIA), and cerebral infarction without residual deficits: Secondary | ICD-10-CM | POA: Diagnosis not present

## 2023-03-25 DIAGNOSIS — I70248 Atherosclerosis of native arteries of left leg with ulceration of other part of lower left leg: Secondary | ICD-10-CM | POA: Diagnosis not present

## 2023-03-25 DIAGNOSIS — N186 End stage renal disease: Secondary | ICD-10-CM | POA: Diagnosis not present

## 2023-03-25 DIAGNOSIS — N2581 Secondary hyperparathyroidism of renal origin: Secondary | ICD-10-CM | POA: Diagnosis not present

## 2023-03-25 DIAGNOSIS — I70238 Atherosclerosis of native arteries of right leg with ulceration of other part of lower right leg: Secondary | ICD-10-CM | POA: Diagnosis not present

## 2023-03-25 DIAGNOSIS — Z992 Dependence on renal dialysis: Secondary | ICD-10-CM | POA: Diagnosis not present

## 2023-03-25 DIAGNOSIS — Z23 Encounter for immunization: Secondary | ICD-10-CM | POA: Diagnosis not present

## 2023-03-25 DIAGNOSIS — M6281 Muscle weakness (generalized): Secondary | ICD-10-CM | POA: Diagnosis not present

## 2023-03-25 DIAGNOSIS — K648 Other hemorrhoids: Secondary | ICD-10-CM | POA: Diagnosis not present

## 2023-03-25 DIAGNOSIS — T8249XA Other complication of vascular dialysis catheter, initial encounter: Secondary | ICD-10-CM | POA: Diagnosis not present

## 2023-03-26 DIAGNOSIS — I739 Peripheral vascular disease, unspecified: Secondary | ICD-10-CM | POA: Diagnosis not present

## 2023-03-26 DIAGNOSIS — F4320 Adjustment disorder, unspecified: Secondary | ICD-10-CM | POA: Diagnosis not present

## 2023-03-26 DIAGNOSIS — G8929 Other chronic pain: Secondary | ICD-10-CM | POA: Diagnosis not present

## 2023-03-27 DIAGNOSIS — E8721 Acute metabolic acidosis: Secondary | ICD-10-CM | POA: Diagnosis not present

## 2023-03-27 DIAGNOSIS — N189 Chronic kidney disease, unspecified: Secondary | ICD-10-CM | POA: Diagnosis not present

## 2023-03-27 DIAGNOSIS — Z23 Encounter for immunization: Secondary | ICD-10-CM | POA: Diagnosis not present

## 2023-03-27 DIAGNOSIS — N186 End stage renal disease: Secondary | ICD-10-CM | POA: Diagnosis not present

## 2023-03-27 DIAGNOSIS — D509 Iron deficiency anemia, unspecified: Secondary | ICD-10-CM | POA: Diagnosis not present

## 2023-03-27 DIAGNOSIS — R392 Extrarenal uremia: Secondary | ICD-10-CM | POA: Diagnosis not present

## 2023-03-27 DIAGNOSIS — T8249XA Other complication of vascular dialysis catheter, initial encounter: Secondary | ICD-10-CM | POA: Diagnosis not present

## 2023-03-27 DIAGNOSIS — N2581 Secondary hyperparathyroidism of renal origin: Secondary | ICD-10-CM | POA: Diagnosis not present

## 2023-03-28 DIAGNOSIS — N186 End stage renal disease: Secondary | ICD-10-CM | POA: Diagnosis not present

## 2023-03-28 DIAGNOSIS — Z992 Dependence on renal dialysis: Secondary | ICD-10-CM | POA: Diagnosis not present

## 2023-03-30 DIAGNOSIS — N186 End stage renal disease: Secondary | ICD-10-CM | POA: Diagnosis not present

## 2023-03-30 DIAGNOSIS — S81801A Unspecified open wound, right lower leg, initial encounter: Secondary | ICD-10-CM | POA: Diagnosis not present

## 2023-03-30 DIAGNOSIS — E8721 Acute metabolic acidosis: Secondary | ICD-10-CM | POA: Diagnosis not present

## 2023-04-01 DIAGNOSIS — I70248 Atherosclerosis of native arteries of left leg with ulceration of other part of lower left leg: Secondary | ICD-10-CM | POA: Diagnosis not present

## 2023-04-01 DIAGNOSIS — I70238 Atherosclerosis of native arteries of right leg with ulceration of other part of lower right leg: Secondary | ICD-10-CM | POA: Diagnosis not present

## 2023-04-06 ENCOUNTER — Telehealth: Payer: Self-pay | Admitting: Medical

## 2023-04-06 NOTE — Telephone Encounter (Signed)
Copied from CRM [id number]. Topic: Medicare AWV >> Apr 06, 2023  1:48 PM Payton Doughty wrote: Reason for CRM: Called 04/06/2023 to sched AWV - NO VOICEMAIL  Verlee Rossetti; Care Guide Ambulatory Clinical Support [place] [facility] Direct Dial: [phone]

## 2023-04-08 DIAGNOSIS — I70238 Atherosclerosis of native arteries of right leg with ulceration of other part of lower right leg: Secondary | ICD-10-CM | POA: Diagnosis not present

## 2023-04-08 DIAGNOSIS — I70248 Atherosclerosis of native arteries of left leg with ulceration of other part of lower left leg: Secondary | ICD-10-CM | POA: Diagnosis not present

## 2023-04-09 DIAGNOSIS — N186 End stage renal disease: Secondary | ICD-10-CM | POA: Diagnosis not present

## 2023-04-09 DIAGNOSIS — L89150 Pressure ulcer of sacral region, unstageable: Secondary | ICD-10-CM | POA: Diagnosis not present

## 2023-04-09 DIAGNOSIS — Z992 Dependence on renal dialysis: Secondary | ICD-10-CM | POA: Diagnosis not present

## 2023-04-09 DIAGNOSIS — M6281 Muscle weakness (generalized): Secondary | ICD-10-CM | POA: Diagnosis not present

## 2023-04-09 DIAGNOSIS — M62838 Other muscle spasm: Secondary | ICD-10-CM | POA: Diagnosis not present

## 2023-04-10 DIAGNOSIS — I10 Essential (primary) hypertension: Secondary | ICD-10-CM | POA: Diagnosis not present

## 2023-04-11 DIAGNOSIS — R7889 Finding of other specified substances, not normally found in blood: Secondary | ICD-10-CM | POA: Diagnosis not present

## 2023-04-11 DIAGNOSIS — R519 Headache, unspecified: Secondary | ICD-10-CM | POA: Diagnosis not present

## 2023-04-11 DIAGNOSIS — B9681 Helicobacter pylori [H. pylori] as the cause of diseases classified elsewhere: Secondary | ICD-10-CM | POA: Diagnosis not present

## 2023-04-11 DIAGNOSIS — R410 Disorientation, unspecified: Secondary | ICD-10-CM | POA: Diagnosis not present

## 2023-04-11 DIAGNOSIS — R000 Tachycardia, unspecified: Secondary | ICD-10-CM | POA: Diagnosis not present

## 2023-04-11 DIAGNOSIS — K254 Chronic or unspecified gastric ulcer with hemorrhage: Secondary | ICD-10-CM | POA: Diagnosis not present

## 2023-04-11 DIAGNOSIS — M6281 Muscle weakness (generalized): Secondary | ICD-10-CM | POA: Diagnosis not present

## 2023-04-11 DIAGNOSIS — K2970 Gastritis, unspecified, without bleeding: Secondary | ICD-10-CM | POA: Diagnosis not present

## 2023-04-11 DIAGNOSIS — I6782 Cerebral ischemia: Secondary | ICD-10-CM | POA: Diagnosis not present

## 2023-04-11 DIAGNOSIS — R471 Dysarthria and anarthria: Secondary | ICD-10-CM | POA: Diagnosis not present

## 2023-04-11 DIAGNOSIS — Z8673 Personal history of transient ischemic attack (TIA), and cerebral infarction without residual deficits: Secondary | ICD-10-CM | POA: Diagnosis not present

## 2023-04-11 DIAGNOSIS — N186 End stage renal disease: Secondary | ICD-10-CM | POA: Diagnosis not present

## 2023-04-11 DIAGNOSIS — I10 Essential (primary) hypertension: Secondary | ICD-10-CM | POA: Diagnosis not present

## 2023-04-11 DIAGNOSIS — F17210 Nicotine dependence, cigarettes, uncomplicated: Secondary | ICD-10-CM | POA: Diagnosis not present

## 2023-04-11 DIAGNOSIS — D631 Anemia in chronic kidney disease: Secondary | ICD-10-CM | POA: Diagnosis not present

## 2023-04-11 DIAGNOSIS — I672 Cerebral atherosclerosis: Secondary | ICD-10-CM | POA: Diagnosis not present

## 2023-04-11 DIAGNOSIS — Z992 Dependence on renal dialysis: Secondary | ICD-10-CM | POA: Diagnosis not present

## 2023-04-11 DIAGNOSIS — K259 Gastric ulcer, unspecified as acute or chronic, without hemorrhage or perforation: Secondary | ICD-10-CM | POA: Diagnosis not present

## 2023-04-11 DIAGNOSIS — K449 Diaphragmatic hernia without obstruction or gangrene: Secondary | ICD-10-CM | POA: Diagnosis not present

## 2023-04-11 DIAGNOSIS — R7989 Other specified abnormal findings of blood chemistry: Secondary | ICD-10-CM | POA: Diagnosis not present

## 2023-04-11 DIAGNOSIS — D179 Benign lipomatous neoplasm, unspecified: Secondary | ICD-10-CM | POA: Diagnosis not present

## 2023-04-11 DIAGNOSIS — D649 Anemia, unspecified: Secondary | ICD-10-CM | POA: Diagnosis not present

## 2023-04-11 DIAGNOSIS — D62 Acute posthemorrhagic anemia: Secondary | ICD-10-CM | POA: Diagnosis not present

## 2023-04-11 DIAGNOSIS — Z91158 Patient's noncompliance with renal dialysis for other reason: Secondary | ICD-10-CM | POA: Diagnosis not present

## 2023-04-11 DIAGNOSIS — G319 Degenerative disease of nervous system, unspecified: Secondary | ICD-10-CM | POA: Diagnosis not present

## 2023-04-11 DIAGNOSIS — K2950 Unspecified chronic gastritis without bleeding: Secondary | ICD-10-CM | POA: Diagnosis not present

## 2023-04-11 DIAGNOSIS — I120 Hypertensive chronic kidney disease with stage 5 chronic kidney disease or end stage renal disease: Secondary | ICD-10-CM | POA: Diagnosis not present

## 2023-04-11 DIAGNOSIS — K644 Residual hemorrhoidal skin tags: Secondary | ICD-10-CM | POA: Diagnosis not present

## 2023-04-11 DIAGNOSIS — K317 Polyp of stomach and duodenum: Secondary | ICD-10-CM | POA: Diagnosis not present

## 2023-04-11 DIAGNOSIS — K648 Other hemorrhoids: Secondary | ICD-10-CM | POA: Diagnosis not present

## 2023-04-11 DIAGNOSIS — K3189 Other diseases of stomach and duodenum: Secondary | ICD-10-CM | POA: Diagnosis not present

## 2023-04-11 DIAGNOSIS — K208 Other esophagitis without bleeding: Secondary | ICD-10-CM | POA: Diagnosis not present

## 2023-04-11 DIAGNOSIS — K2091 Esophagitis, unspecified with bleeding: Secondary | ICD-10-CM | POA: Diagnosis not present

## 2023-04-11 DIAGNOSIS — R0902 Hypoxemia: Secondary | ICD-10-CM | POA: Diagnosis not present

## 2023-04-11 DIAGNOSIS — E785 Hyperlipidemia, unspecified: Secondary | ICD-10-CM | POA: Diagnosis not present

## 2023-04-11 DIAGNOSIS — E875 Hyperkalemia: Secondary | ICD-10-CM | POA: Diagnosis not present

## 2023-04-11 DIAGNOSIS — Z743 Need for continuous supervision: Secondary | ICD-10-CM | POA: Diagnosis not present

## 2023-04-11 DIAGNOSIS — I69320 Aphasia following cerebral infarction: Secondary | ICD-10-CM | POA: Diagnosis not present

## 2023-04-11 DIAGNOSIS — E871 Hypo-osmolality and hyponatremia: Secondary | ICD-10-CM | POA: Diagnosis not present

## 2023-04-16 DIAGNOSIS — M6281 Muscle weakness (generalized): Secondary | ICD-10-CM | POA: Diagnosis not present

## 2023-04-16 DIAGNOSIS — N186 End stage renal disease: Secondary | ICD-10-CM | POA: Diagnosis not present

## 2023-04-16 DIAGNOSIS — D649 Anemia, unspecified: Secondary | ICD-10-CM | POA: Diagnosis not present

## 2023-04-16 DIAGNOSIS — D72829 Elevated white blood cell count, unspecified: Secondary | ICD-10-CM | POA: Diagnosis not present

## 2023-04-16 DIAGNOSIS — R58 Hemorrhage, not elsewhere classified: Secondary | ICD-10-CM | POA: Diagnosis not present

## 2023-04-16 DIAGNOSIS — Z992 Dependence on renal dialysis: Secondary | ICD-10-CM | POA: Diagnosis not present

## 2023-04-16 DIAGNOSIS — I10 Essential (primary) hypertension: Secondary | ICD-10-CM | POA: Diagnosis not present

## 2023-04-16 DIAGNOSIS — Z743 Need for continuous supervision: Secondary | ICD-10-CM | POA: Diagnosis not present

## 2023-04-16 DIAGNOSIS — J9811 Atelectasis: Secondary | ICD-10-CM | POA: Diagnosis not present

## 2023-04-17 DIAGNOSIS — I10 Essential (primary) hypertension: Secondary | ICD-10-CM | POA: Diagnosis not present

## 2023-04-20 DIAGNOSIS — M6281 Muscle weakness (generalized): Secondary | ICD-10-CM | POA: Diagnosis not present

## 2023-04-20 DIAGNOSIS — S81801A Unspecified open wound, right lower leg, initial encounter: Secondary | ICD-10-CM | POA: Diagnosis not present

## 2023-04-20 DIAGNOSIS — E8721 Acute metabolic acidosis: Secondary | ICD-10-CM | POA: Diagnosis not present

## 2023-04-20 DIAGNOSIS — Z992 Dependence on renal dialysis: Secondary | ICD-10-CM | POA: Diagnosis not present

## 2023-04-20 DIAGNOSIS — N186 End stage renal disease: Secondary | ICD-10-CM | POA: Diagnosis not present

## 2023-04-22 DIAGNOSIS — I70238 Atherosclerosis of native arteries of right leg with ulceration of other part of lower right leg: Secondary | ICD-10-CM | POA: Diagnosis not present

## 2023-04-22 DIAGNOSIS — I70248 Atherosclerosis of native arteries of left leg with ulceration of other part of lower left leg: Secondary | ICD-10-CM | POA: Diagnosis not present

## 2023-04-23 DIAGNOSIS — Z01810 Encounter for preprocedural cardiovascular examination: Secondary | ICD-10-CM | POA: Diagnosis not present

## 2023-04-23 DIAGNOSIS — I82713 Chronic embolism and thrombosis of superficial veins of upper extremity, bilateral: Secondary | ICD-10-CM | POA: Diagnosis not present

## 2023-04-23 DIAGNOSIS — N186 End stage renal disease: Secondary | ICD-10-CM | POA: Diagnosis not present

## 2023-04-23 DIAGNOSIS — Z992 Dependence on renal dialysis: Secondary | ICD-10-CM | POA: Diagnosis not present

## 2023-04-27 DIAGNOSIS — N186 End stage renal disease: Secondary | ICD-10-CM | POA: Diagnosis not present

## 2023-04-27 DIAGNOSIS — Z992 Dependence on renal dialysis: Secondary | ICD-10-CM | POA: Diagnosis not present

## 2023-04-28 DIAGNOSIS — R059 Cough, unspecified: Secondary | ICD-10-CM | POA: Diagnosis not present

## 2023-04-28 DIAGNOSIS — I517 Cardiomegaly: Secondary | ICD-10-CM | POA: Diagnosis not present

## 2023-04-29 DIAGNOSIS — I70238 Atherosclerosis of native arteries of right leg with ulceration of other part of lower right leg: Secondary | ICD-10-CM | POA: Diagnosis not present

## 2023-04-29 DIAGNOSIS — L89153 Pressure ulcer of sacral region, stage 3: Secondary | ICD-10-CM | POA: Diagnosis not present

## 2023-04-29 DIAGNOSIS — E8721 Acute metabolic acidosis: Secondary | ICD-10-CM | POA: Diagnosis not present

## 2023-04-29 DIAGNOSIS — I70248 Atherosclerosis of native arteries of left leg with ulceration of other part of lower left leg: Secondary | ICD-10-CM | POA: Diagnosis not present

## 2023-04-29 DIAGNOSIS — S81801A Unspecified open wound, right lower leg, initial encounter: Secondary | ICD-10-CM | POA: Diagnosis not present

## 2023-04-29 DIAGNOSIS — N186 End stage renal disease: Secondary | ICD-10-CM | POA: Diagnosis not present

## 2023-04-30 DIAGNOSIS — Z7401 Bed confinement status: Secondary | ICD-10-CM | POA: Diagnosis not present

## 2023-04-30 DIAGNOSIS — R262 Difficulty in walking, not elsewhere classified: Secondary | ICD-10-CM | POA: Diagnosis not present

## 2023-04-30 DIAGNOSIS — R531 Weakness: Secondary | ICD-10-CM | POA: Diagnosis not present

## 2023-05-02 DIAGNOSIS — Z7401 Bed confinement status: Secondary | ICD-10-CM | POA: Diagnosis not present

## 2023-05-02 DIAGNOSIS — R410 Disorientation, unspecified: Secondary | ICD-10-CM | POA: Diagnosis not present

## 2023-05-02 DIAGNOSIS — I10 Essential (primary) hypertension: Secondary | ICD-10-CM | POA: Diagnosis not present

## 2023-05-02 DIAGNOSIS — R531 Weakness: Secondary | ICD-10-CM | POA: Diagnosis not present

## 2023-05-04 DIAGNOSIS — S81801A Unspecified open wound, right lower leg, initial encounter: Secondary | ICD-10-CM | POA: Diagnosis not present

## 2023-05-04 DIAGNOSIS — N186 End stage renal disease: Secondary | ICD-10-CM | POA: Diagnosis not present

## 2023-05-04 DIAGNOSIS — E8721 Acute metabolic acidosis: Secondary | ICD-10-CM | POA: Diagnosis not present

## 2023-05-06 DIAGNOSIS — I70238 Atherosclerosis of native arteries of right leg with ulceration of other part of lower right leg: Secondary | ICD-10-CM | POA: Diagnosis not present

## 2023-05-06 DIAGNOSIS — E8721 Acute metabolic acidosis: Secondary | ICD-10-CM | POA: Diagnosis not present

## 2023-05-06 DIAGNOSIS — S81801A Unspecified open wound, right lower leg, initial encounter: Secondary | ICD-10-CM | POA: Diagnosis not present

## 2023-05-06 DIAGNOSIS — N186 End stage renal disease: Secondary | ICD-10-CM | POA: Diagnosis not present

## 2023-05-06 DIAGNOSIS — I70248 Atherosclerosis of native arteries of left leg with ulceration of other part of lower left leg: Secondary | ICD-10-CM | POA: Diagnosis not present

## 2023-05-06 DIAGNOSIS — L89153 Pressure ulcer of sacral region, stage 3: Secondary | ICD-10-CM | POA: Diagnosis not present

## 2023-05-13 DIAGNOSIS — I70238 Atherosclerosis of native arteries of right leg with ulceration of other part of lower right leg: Secondary | ICD-10-CM | POA: Diagnosis not present

## 2023-05-13 DIAGNOSIS — I70248 Atherosclerosis of native arteries of left leg with ulceration of other part of lower left leg: Secondary | ICD-10-CM | POA: Diagnosis not present

## 2023-05-13 DIAGNOSIS — L89153 Pressure ulcer of sacral region, stage 3: Secondary | ICD-10-CM | POA: Diagnosis not present

## 2023-05-14 DIAGNOSIS — D631 Anemia in chronic kidney disease: Secondary | ICD-10-CM | POA: Diagnosis not present

## 2023-05-14 DIAGNOSIS — R531 Weakness: Secondary | ICD-10-CM | POA: Diagnosis not present

## 2023-05-14 DIAGNOSIS — Z743 Need for continuous supervision: Secondary | ICD-10-CM | POA: Diagnosis not present

## 2023-05-14 DIAGNOSIS — N186 End stage renal disease: Secondary | ICD-10-CM | POA: Diagnosis not present

## 2023-05-14 DIAGNOSIS — I120 Hypertensive chronic kidney disease with stage 5 chronic kidney disease or end stage renal disease: Secondary | ICD-10-CM | POA: Diagnosis not present

## 2023-05-14 DIAGNOSIS — D649 Anemia, unspecified: Secondary | ICD-10-CM | POA: Diagnosis not present

## 2023-05-14 DIAGNOSIS — F17210 Nicotine dependence, cigarettes, uncomplicated: Secondary | ICD-10-CM | POA: Diagnosis not present

## 2023-05-14 DIAGNOSIS — I10 Essential (primary) hypertension: Secondary | ICD-10-CM | POA: Diagnosis not present

## 2023-05-14 DIAGNOSIS — Z79899 Other long term (current) drug therapy: Secondary | ICD-10-CM | POA: Diagnosis not present

## 2023-05-14 DIAGNOSIS — Z992 Dependence on renal dialysis: Secondary | ICD-10-CM | POA: Diagnosis not present

## 2023-05-15 DIAGNOSIS — Z7401 Bed confinement status: Secondary | ICD-10-CM | POA: Diagnosis not present

## 2023-05-15 DIAGNOSIS — L603 Nail dystrophy: Secondary | ICD-10-CM | POA: Diagnosis not present

## 2023-05-15 DIAGNOSIS — R531 Weakness: Secondary | ICD-10-CM | POA: Diagnosis not present

## 2023-05-15 DIAGNOSIS — L602 Onychogryphosis: Secondary | ICD-10-CM | POA: Diagnosis not present

## 2023-05-15 DIAGNOSIS — Z043 Encounter for examination and observation following other accident: Secondary | ICD-10-CM | POA: Diagnosis not present

## 2023-05-15 DIAGNOSIS — W06XXXA Fall from bed, initial encounter: Secondary | ICD-10-CM | POA: Diagnosis not present

## 2023-05-15 DIAGNOSIS — L84 Corns and callosities: Secondary | ICD-10-CM | POA: Diagnosis not present

## 2023-05-15 DIAGNOSIS — I739 Peripheral vascular disease, unspecified: Secondary | ICD-10-CM | POA: Diagnosis not present

## 2023-05-18 DIAGNOSIS — Z992 Dependence on renal dialysis: Secondary | ICD-10-CM | POA: Diagnosis not present

## 2023-05-18 DIAGNOSIS — I10 Essential (primary) hypertension: Secondary | ICD-10-CM | POA: Diagnosis not present

## 2023-05-18 DIAGNOSIS — N186 End stage renal disease: Secondary | ICD-10-CM | POA: Diagnosis not present

## 2023-05-20 DIAGNOSIS — M6281 Muscle weakness (generalized): Secondary | ICD-10-CM | POA: Diagnosis not present

## 2023-05-20 DIAGNOSIS — I70238 Atherosclerosis of native arteries of right leg with ulceration of other part of lower right leg: Secondary | ICD-10-CM | POA: Diagnosis not present

## 2023-05-20 DIAGNOSIS — I70248 Atherosclerosis of native arteries of left leg with ulceration of other part of lower left leg: Secondary | ICD-10-CM | POA: Diagnosis not present

## 2023-05-25 DIAGNOSIS — R482 Apraxia: Secondary | ICD-10-CM | POA: Diagnosis not present

## 2023-05-25 DIAGNOSIS — N186 End stage renal disease: Secondary | ICD-10-CM | POA: Diagnosis not present

## 2023-05-25 DIAGNOSIS — R262 Difficulty in walking, not elsewhere classified: Secondary | ICD-10-CM | POA: Diagnosis not present

## 2023-05-25 DIAGNOSIS — Z8673 Personal history of transient ischemic attack (TIA), and cerebral infarction without residual deficits: Secondary | ICD-10-CM | POA: Diagnosis not present

## 2023-05-27 DIAGNOSIS — M6281 Muscle weakness (generalized): Secondary | ICD-10-CM | POA: Diagnosis not present

## 2023-05-27 DIAGNOSIS — I70248 Atherosclerosis of native arteries of left leg with ulceration of other part of lower left leg: Secondary | ICD-10-CM | POA: Diagnosis not present

## 2023-05-27 DIAGNOSIS — I70238 Atherosclerosis of native arteries of right leg with ulceration of other part of lower right leg: Secondary | ICD-10-CM | POA: Diagnosis not present

## 2023-05-28 DIAGNOSIS — Z992 Dependence on renal dialysis: Secondary | ICD-10-CM | POA: Diagnosis not present

## 2023-05-28 DIAGNOSIS — N186 End stage renal disease: Secondary | ICD-10-CM | POA: Diagnosis not present

## 2023-06-03 DIAGNOSIS — I70238 Atherosclerosis of native arteries of right leg with ulceration of other part of lower right leg: Secondary | ICD-10-CM | POA: Diagnosis not present

## 2023-06-03 DIAGNOSIS — M6281 Muscle weakness (generalized): Secondary | ICD-10-CM | POA: Diagnosis not present

## 2023-06-03 DIAGNOSIS — I70248 Atherosclerosis of native arteries of left leg with ulceration of other part of lower left leg: Secondary | ICD-10-CM | POA: Diagnosis not present

## 2023-06-10 DIAGNOSIS — I70248 Atherosclerosis of native arteries of left leg with ulceration of other part of lower left leg: Secondary | ICD-10-CM | POA: Diagnosis not present

## 2023-06-10 DIAGNOSIS — M6281 Muscle weakness (generalized): Secondary | ICD-10-CM | POA: Diagnosis not present

## 2023-06-10 DIAGNOSIS — I70238 Atherosclerosis of native arteries of right leg with ulceration of other part of lower right leg: Secondary | ICD-10-CM | POA: Diagnosis not present

## 2023-06-16 DIAGNOSIS — I10 Essential (primary) hypertension: Secondary | ICD-10-CM | POA: Diagnosis not present

## 2023-06-16 DIAGNOSIS — Z992 Dependence on renal dialysis: Secondary | ICD-10-CM | POA: Diagnosis not present

## 2023-06-16 DIAGNOSIS — Z043 Encounter for examination and observation following other accident: Secondary | ICD-10-CM | POA: Diagnosis not present

## 2023-06-16 DIAGNOSIS — N186 End stage renal disease: Secondary | ICD-10-CM | POA: Diagnosis not present

## 2023-06-17 DIAGNOSIS — I70248 Atherosclerosis of native arteries of left leg with ulceration of other part of lower left leg: Secondary | ICD-10-CM | POA: Diagnosis not present

## 2023-06-17 DIAGNOSIS — I70238 Atherosclerosis of native arteries of right leg with ulceration of other part of lower right leg: Secondary | ICD-10-CM | POA: Diagnosis not present

## 2023-06-19 DIAGNOSIS — L089 Local infection of the skin and subcutaneous tissue, unspecified: Secondary | ICD-10-CM | POA: Diagnosis not present

## 2023-06-19 DIAGNOSIS — R278 Other lack of coordination: Secondary | ICD-10-CM | POA: Diagnosis not present

## 2023-06-19 DIAGNOSIS — M6250 Muscle wasting and atrophy, not elsewhere classified, unspecified site: Secondary | ICD-10-CM | POA: Diagnosis not present

## 2023-06-19 DIAGNOSIS — N19 Unspecified kidney failure: Secondary | ICD-10-CM | POA: Diagnosis not present

## 2023-06-19 DIAGNOSIS — N186 End stage renal disease: Secondary | ICD-10-CM | POA: Diagnosis not present

## 2023-06-22 DIAGNOSIS — R278 Other lack of coordination: Secondary | ICD-10-CM | POA: Diagnosis not present

## 2023-06-22 DIAGNOSIS — Z992 Dependence on renal dialysis: Secondary | ICD-10-CM | POA: Diagnosis not present

## 2023-06-22 DIAGNOSIS — M6250 Muscle wasting and atrophy, not elsewhere classified, unspecified site: Secondary | ICD-10-CM | POA: Diagnosis not present

## 2023-06-22 DIAGNOSIS — L089 Local infection of the skin and subcutaneous tissue, unspecified: Secondary | ICD-10-CM | POA: Diagnosis not present

## 2023-06-22 DIAGNOSIS — N186 End stage renal disease: Secondary | ICD-10-CM | POA: Diagnosis not present

## 2023-06-22 DIAGNOSIS — D649 Anemia, unspecified: Secondary | ICD-10-CM | POA: Diagnosis not present

## 2023-06-22 DIAGNOSIS — I10 Essential (primary) hypertension: Secondary | ICD-10-CM | POA: Diagnosis not present

## 2023-06-22 DIAGNOSIS — N19 Unspecified kidney failure: Secondary | ICD-10-CM | POA: Diagnosis not present

## 2023-06-24 DIAGNOSIS — I70248 Atherosclerosis of native arteries of left leg with ulceration of other part of lower left leg: Secondary | ICD-10-CM | POA: Diagnosis not present

## 2023-06-24 DIAGNOSIS — N19 Unspecified kidney failure: Secondary | ICD-10-CM | POA: Diagnosis not present

## 2023-06-24 DIAGNOSIS — M6250 Muscle wasting and atrophy, not elsewhere classified, unspecified site: Secondary | ICD-10-CM | POA: Diagnosis not present

## 2023-06-24 DIAGNOSIS — I70238 Atherosclerosis of native arteries of right leg with ulceration of other part of lower right leg: Secondary | ICD-10-CM | POA: Diagnosis not present

## 2023-06-24 DIAGNOSIS — L089 Local infection of the skin and subcutaneous tissue, unspecified: Secondary | ICD-10-CM | POA: Diagnosis not present

## 2023-06-24 DIAGNOSIS — R278 Other lack of coordination: Secondary | ICD-10-CM | POA: Diagnosis not present

## 2023-06-24 DIAGNOSIS — N186 End stage renal disease: Secondary | ICD-10-CM | POA: Diagnosis not present

## 2023-06-27 DIAGNOSIS — N186 End stage renal disease: Secondary | ICD-10-CM | POA: Diagnosis not present

## 2023-06-27 DIAGNOSIS — Z992 Dependence on renal dialysis: Secondary | ICD-10-CM | POA: Diagnosis not present

## 2023-06-30 DIAGNOSIS — M6281 Muscle weakness (generalized): Secondary | ICD-10-CM | POA: Diagnosis not present

## 2023-06-30 DIAGNOSIS — Z8673 Personal history of transient ischemic attack (TIA), and cerebral infarction without residual deficits: Secondary | ICD-10-CM | POA: Diagnosis not present

## 2023-06-30 DIAGNOSIS — N186 End stage renal disease: Secondary | ICD-10-CM | POA: Diagnosis not present

## 2023-06-30 DIAGNOSIS — M6250 Muscle wasting and atrophy, not elsewhere classified, unspecified site: Secondary | ICD-10-CM | POA: Diagnosis not present

## 2023-06-30 DIAGNOSIS — R293 Abnormal posture: Secondary | ICD-10-CM | POA: Diagnosis not present

## 2023-06-30 DIAGNOSIS — R278 Other lack of coordination: Secondary | ICD-10-CM | POA: Diagnosis not present

## 2023-06-30 DIAGNOSIS — L089 Local infection of the skin and subcutaneous tissue, unspecified: Secondary | ICD-10-CM | POA: Diagnosis not present

## 2023-06-30 DIAGNOSIS — N19 Unspecified kidney failure: Secondary | ICD-10-CM | POA: Diagnosis not present

## 2023-07-01 DIAGNOSIS — M6281 Muscle weakness (generalized): Secondary | ICD-10-CM | POA: Diagnosis not present

## 2023-07-01 DIAGNOSIS — I70238 Atherosclerosis of native arteries of right leg with ulceration of other part of lower right leg: Secondary | ICD-10-CM | POA: Diagnosis not present

## 2023-07-01 DIAGNOSIS — I70248 Atherosclerosis of native arteries of left leg with ulceration of other part of lower left leg: Secondary | ICD-10-CM | POA: Diagnosis not present

## 2023-07-02 DIAGNOSIS — M6281 Muscle weakness (generalized): Secondary | ICD-10-CM | POA: Diagnosis not present

## 2023-07-02 DIAGNOSIS — L089 Local infection of the skin and subcutaneous tissue, unspecified: Secondary | ICD-10-CM | POA: Diagnosis not present

## 2023-07-02 DIAGNOSIS — R293 Abnormal posture: Secondary | ICD-10-CM | POA: Diagnosis not present

## 2023-07-02 DIAGNOSIS — D509 Iron deficiency anemia, unspecified: Secondary | ICD-10-CM | POA: Diagnosis not present

## 2023-07-02 DIAGNOSIS — Z8673 Personal history of transient ischemic attack (TIA), and cerebral infarction without residual deficits: Secondary | ICD-10-CM | POA: Diagnosis not present

## 2023-07-02 DIAGNOSIS — M6250 Muscle wasting and atrophy, not elsewhere classified, unspecified site: Secondary | ICD-10-CM | POA: Diagnosis not present

## 2023-07-02 DIAGNOSIS — N186 End stage renal disease: Secondary | ICD-10-CM | POA: Diagnosis not present

## 2023-07-02 DIAGNOSIS — R278 Other lack of coordination: Secondary | ICD-10-CM | POA: Diagnosis not present

## 2023-07-02 DIAGNOSIS — S81802A Unspecified open wound, left lower leg, initial encounter: Secondary | ICD-10-CM | POA: Diagnosis not present

## 2023-07-02 DIAGNOSIS — Z992 Dependence on renal dialysis: Secondary | ICD-10-CM | POA: Diagnosis not present

## 2023-07-02 DIAGNOSIS — N19 Unspecified kidney failure: Secondary | ICD-10-CM | POA: Diagnosis not present

## 2023-07-02 DIAGNOSIS — D649 Anemia, unspecified: Secondary | ICD-10-CM | POA: Diagnosis not present

## 2023-07-02 DIAGNOSIS — L89150 Pressure ulcer of sacral region, unstageable: Secondary | ICD-10-CM | POA: Diagnosis not present

## 2023-07-02 DIAGNOSIS — I739 Peripheral vascular disease, unspecified: Secondary | ICD-10-CM | POA: Diagnosis not present

## 2023-07-08 DIAGNOSIS — M6281 Muscle weakness (generalized): Secondary | ICD-10-CM | POA: Diagnosis not present

## 2023-07-08 DIAGNOSIS — I70248 Atherosclerosis of native arteries of left leg with ulceration of other part of lower left leg: Secondary | ICD-10-CM | POA: Diagnosis not present

## 2023-07-08 DIAGNOSIS — I70238 Atherosclerosis of native arteries of right leg with ulceration of other part of lower right leg: Secondary | ICD-10-CM | POA: Diagnosis not present

## 2023-07-09 DIAGNOSIS — R278 Other lack of coordination: Secondary | ICD-10-CM | POA: Diagnosis not present

## 2023-07-09 DIAGNOSIS — N186 End stage renal disease: Secondary | ICD-10-CM | POA: Diagnosis not present

## 2023-07-09 DIAGNOSIS — M6250 Muscle wasting and atrophy, not elsewhere classified, unspecified site: Secondary | ICD-10-CM | POA: Diagnosis not present

## 2023-07-09 DIAGNOSIS — M6281 Muscle weakness (generalized): Secondary | ICD-10-CM | POA: Diagnosis not present

## 2023-07-09 DIAGNOSIS — L089 Local infection of the skin and subcutaneous tissue, unspecified: Secondary | ICD-10-CM | POA: Diagnosis not present

## 2023-07-09 DIAGNOSIS — R293 Abnormal posture: Secondary | ICD-10-CM | POA: Diagnosis not present

## 2023-07-09 DIAGNOSIS — Z8673 Personal history of transient ischemic attack (TIA), and cerebral infarction without residual deficits: Secondary | ICD-10-CM | POA: Diagnosis not present

## 2023-07-09 DIAGNOSIS — N19 Unspecified kidney failure: Secondary | ICD-10-CM | POA: Diagnosis not present

## 2023-07-14 DIAGNOSIS — F4320 Adjustment disorder, unspecified: Secondary | ICD-10-CM | POA: Diagnosis not present

## 2023-07-15 DIAGNOSIS — I70238 Atherosclerosis of native arteries of right leg with ulceration of other part of lower right leg: Secondary | ICD-10-CM | POA: Diagnosis not present

## 2023-07-15 DIAGNOSIS — I70248 Atherosclerosis of native arteries of left leg with ulceration of other part of lower left leg: Secondary | ICD-10-CM | POA: Diagnosis not present

## 2023-07-16 DIAGNOSIS — R278 Other lack of coordination: Secondary | ICD-10-CM | POA: Diagnosis not present

## 2023-07-16 DIAGNOSIS — Z8673 Personal history of transient ischemic attack (TIA), and cerebral infarction without residual deficits: Secondary | ICD-10-CM | POA: Diagnosis not present

## 2023-07-16 DIAGNOSIS — N186 End stage renal disease: Secondary | ICD-10-CM | POA: Diagnosis not present

## 2023-07-16 DIAGNOSIS — L089 Local infection of the skin and subcutaneous tissue, unspecified: Secondary | ICD-10-CM | POA: Diagnosis not present

## 2023-07-16 DIAGNOSIS — R293 Abnormal posture: Secondary | ICD-10-CM | POA: Diagnosis not present

## 2023-07-16 DIAGNOSIS — M6250 Muscle wasting and atrophy, not elsewhere classified, unspecified site: Secondary | ICD-10-CM | POA: Diagnosis not present

## 2023-07-16 DIAGNOSIS — N19 Unspecified kidney failure: Secondary | ICD-10-CM | POA: Diagnosis not present

## 2023-07-16 DIAGNOSIS — M6281 Muscle weakness (generalized): Secondary | ICD-10-CM | POA: Diagnosis not present

## 2023-07-17 IMAGING — CR DG HIP (WITH OR WITHOUT PELVIS) 2-3V*R*
3 series · 3 of 3 positions shown · non-contrast
Comparison: None.

CLINICAL DATA: Pt complains of bilateral hip pain but the right hip
is worse. States he has had pain for a few months but this past week
has been worse. States he states up most of the time at his job. No
hx of hip injury or surgeries. right hip pain

EXAM:
DG HIP (WITH OR WITHOUT PELVIS) 2-3V RIGHT

[t hip ap right (1 of 2)]
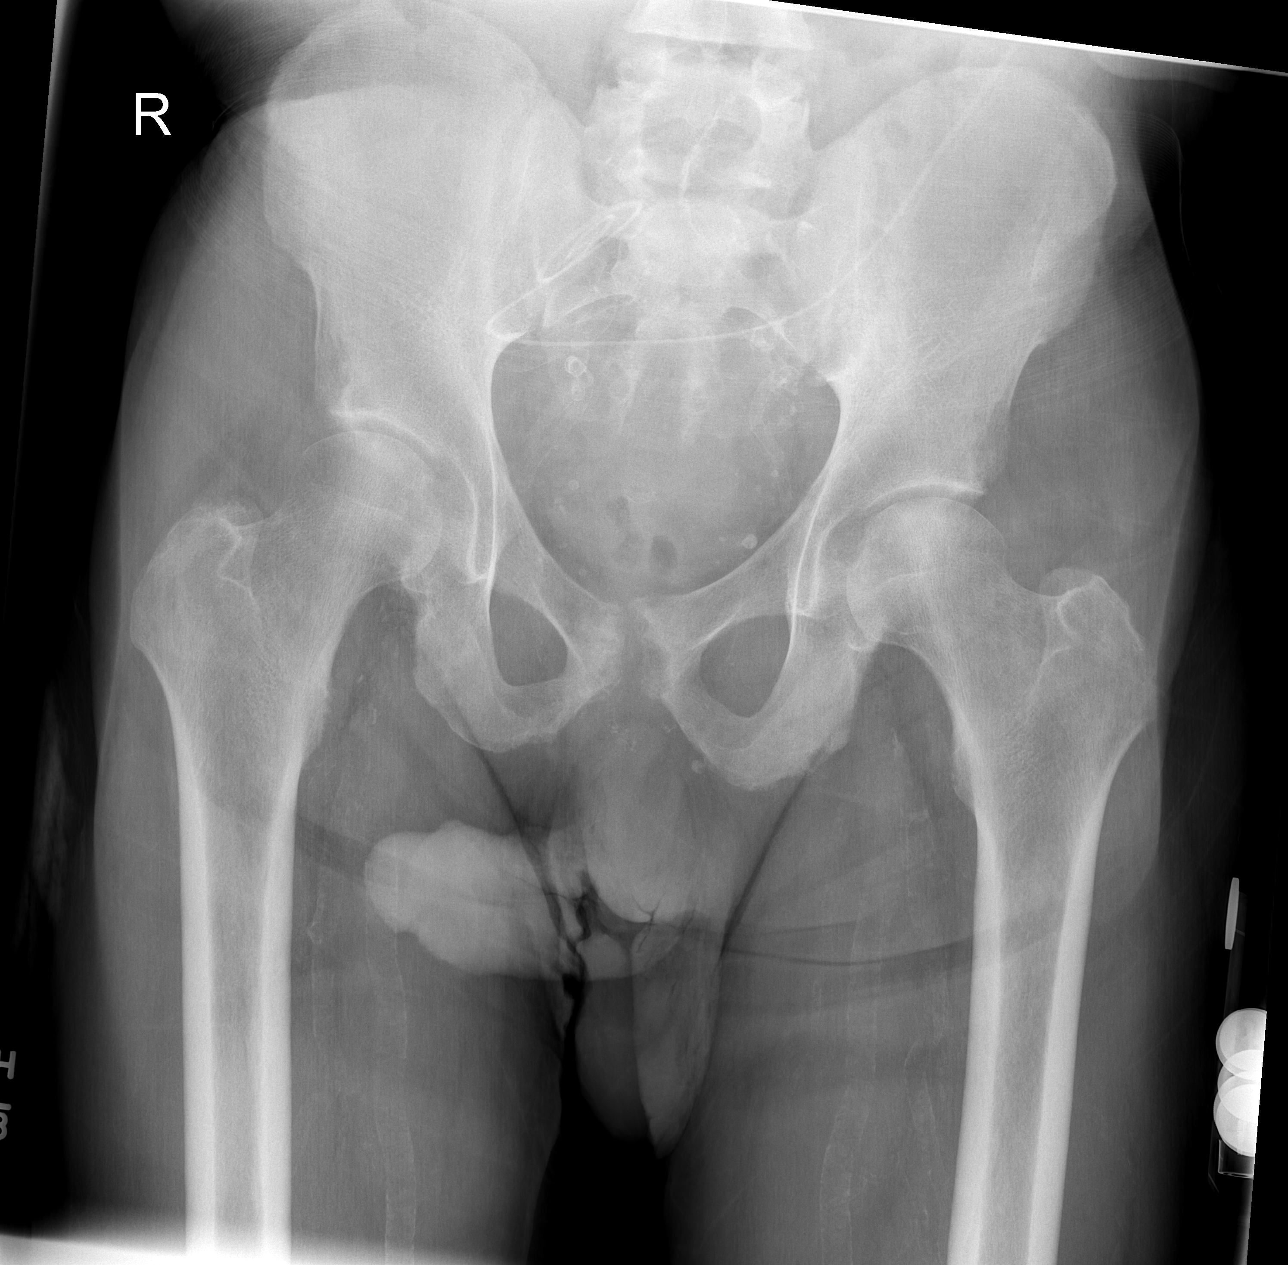

[t hip ap right (2 of 2)]
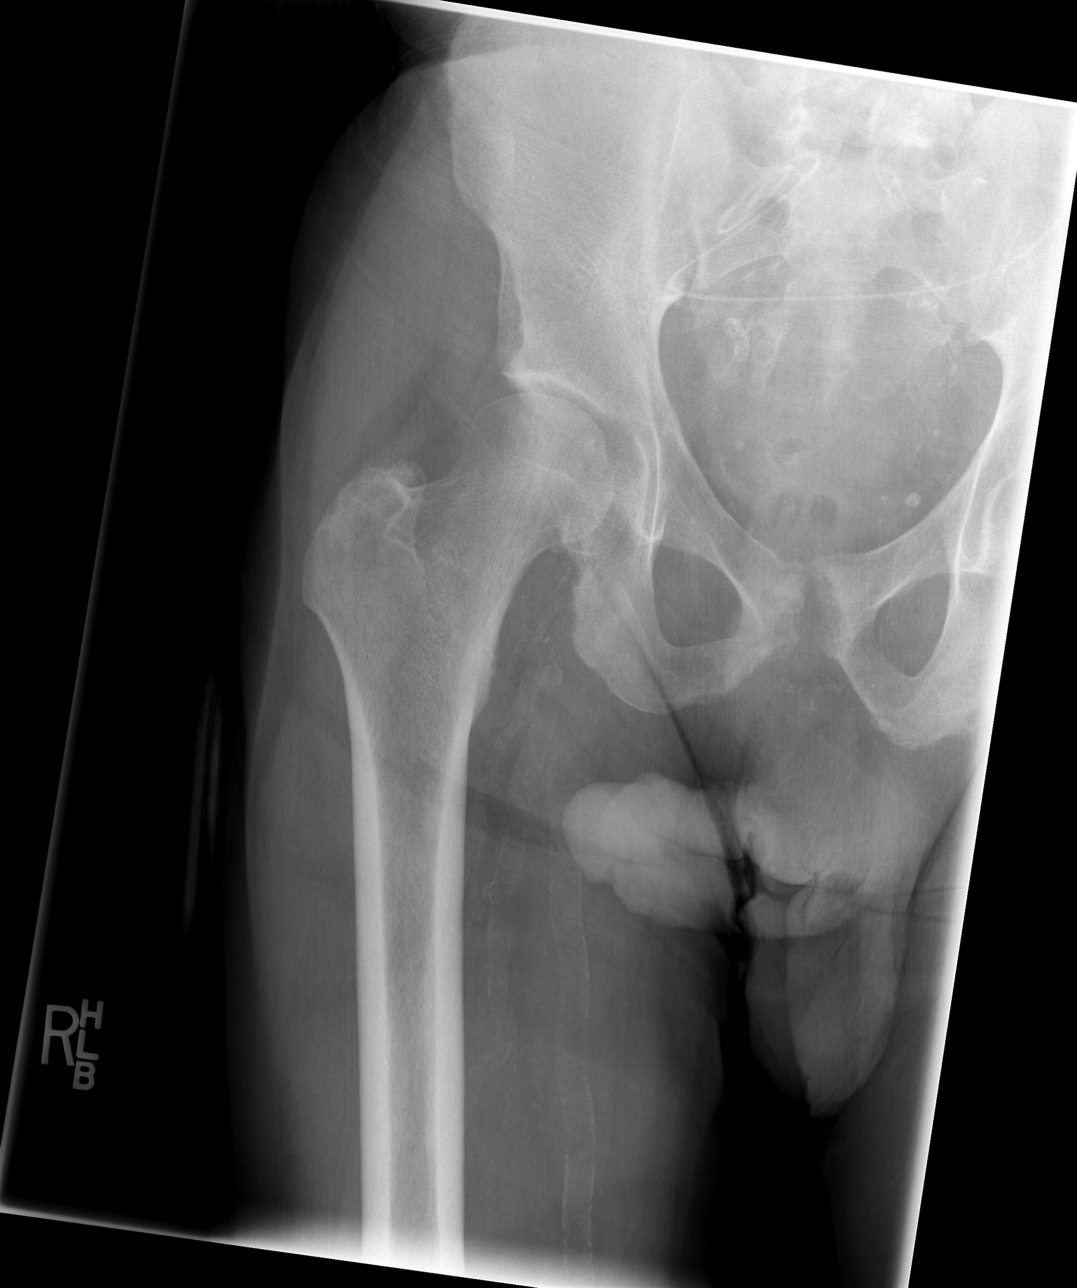

[t hip frog leg right]
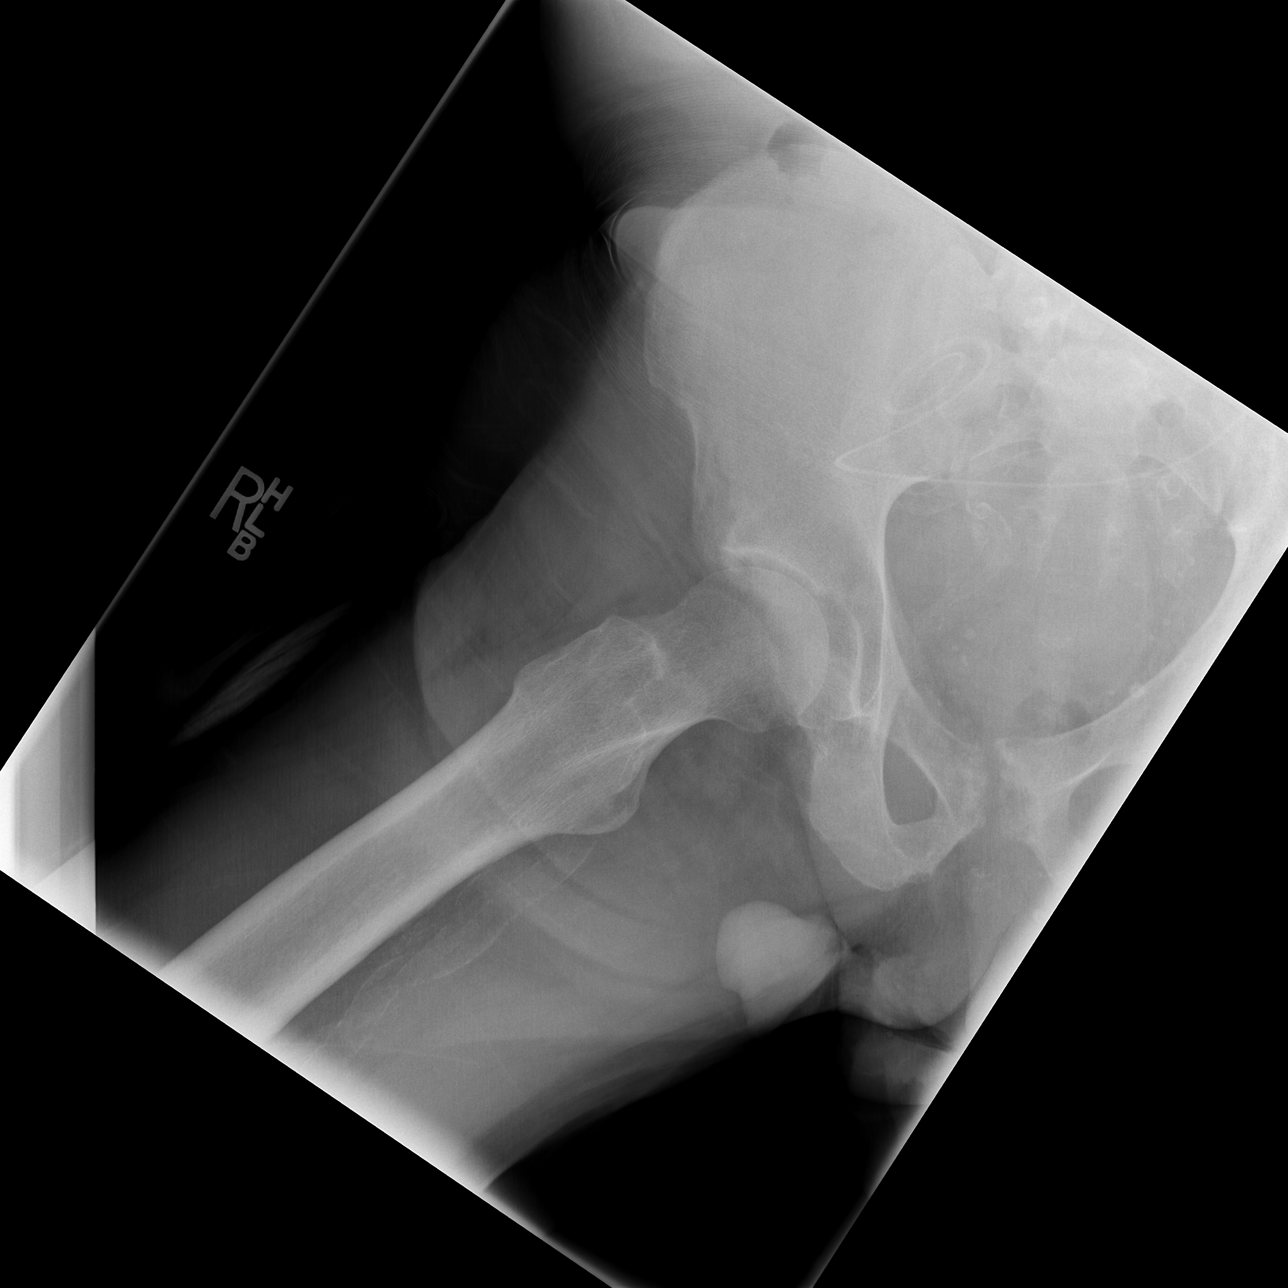

[3 of 3 positions shown; findings below may reference images not displayed]

FINDINGS: Hips are located. No evidence of pelvic fracture or sacral fracture.
Dedicated view of the RIGHT hip demonstrates no femoral neck
fracture.
IMPRESSION: 1. No fracture dislocation.
2. Significant arthropathy identified.

## 2023-07-19 DIAGNOSIS — R278 Other lack of coordination: Secondary | ICD-10-CM | POA: Diagnosis not present

## 2023-07-19 DIAGNOSIS — M6281 Muscle weakness (generalized): Secondary | ICD-10-CM | POA: Diagnosis not present

## 2023-07-19 DIAGNOSIS — Z8673 Personal history of transient ischemic attack (TIA), and cerebral infarction without residual deficits: Secondary | ICD-10-CM | POA: Diagnosis not present

## 2023-07-19 DIAGNOSIS — M6250 Muscle wasting and atrophy, not elsewhere classified, unspecified site: Secondary | ICD-10-CM | POA: Diagnosis not present

## 2023-07-19 DIAGNOSIS — L089 Local infection of the skin and subcutaneous tissue, unspecified: Secondary | ICD-10-CM | POA: Diagnosis not present

## 2023-07-19 DIAGNOSIS — R293 Abnormal posture: Secondary | ICD-10-CM | POA: Diagnosis not present

## 2023-07-19 DIAGNOSIS — N186 End stage renal disease: Secondary | ICD-10-CM | POA: Diagnosis not present

## 2023-07-19 DIAGNOSIS — N19 Unspecified kidney failure: Secondary | ICD-10-CM | POA: Diagnosis not present

## 2023-07-21 DIAGNOSIS — M6250 Muscle wasting and atrophy, not elsewhere classified, unspecified site: Secondary | ICD-10-CM | POA: Diagnosis not present

## 2023-07-21 DIAGNOSIS — R278 Other lack of coordination: Secondary | ICD-10-CM | POA: Diagnosis not present

## 2023-07-21 DIAGNOSIS — N186 End stage renal disease: Secondary | ICD-10-CM | POA: Diagnosis not present

## 2023-07-21 DIAGNOSIS — M6281 Muscle weakness (generalized): Secondary | ICD-10-CM | POA: Diagnosis not present

## 2023-07-21 DIAGNOSIS — N19 Unspecified kidney failure: Secondary | ICD-10-CM | POA: Diagnosis not present

## 2023-07-21 DIAGNOSIS — Z8673 Personal history of transient ischemic attack (TIA), and cerebral infarction without residual deficits: Secondary | ICD-10-CM | POA: Diagnosis not present

## 2023-07-21 DIAGNOSIS — L089 Local infection of the skin and subcutaneous tissue, unspecified: Secondary | ICD-10-CM | POA: Diagnosis not present

## 2023-07-21 DIAGNOSIS — R293 Abnormal posture: Secondary | ICD-10-CM | POA: Diagnosis not present

## 2023-07-23 DIAGNOSIS — M6250 Muscle wasting and atrophy, not elsewhere classified, unspecified site: Secondary | ICD-10-CM | POA: Diagnosis not present

## 2023-07-23 DIAGNOSIS — L089 Local infection of the skin and subcutaneous tissue, unspecified: Secondary | ICD-10-CM | POA: Diagnosis not present

## 2023-07-23 DIAGNOSIS — M6281 Muscle weakness (generalized): Secondary | ICD-10-CM | POA: Diagnosis not present

## 2023-07-23 DIAGNOSIS — Z8673 Personal history of transient ischemic attack (TIA), and cerebral infarction without residual deficits: Secondary | ICD-10-CM | POA: Diagnosis not present

## 2023-07-23 DIAGNOSIS — I70238 Atherosclerosis of native arteries of right leg with ulceration of other part of lower right leg: Secondary | ICD-10-CM | POA: Diagnosis not present

## 2023-07-23 DIAGNOSIS — N19 Unspecified kidney failure: Secondary | ICD-10-CM | POA: Diagnosis not present

## 2023-07-23 DIAGNOSIS — I70248 Atherosclerosis of native arteries of left leg with ulceration of other part of lower left leg: Secondary | ICD-10-CM | POA: Diagnosis not present

## 2023-07-23 DIAGNOSIS — R293 Abnormal posture: Secondary | ICD-10-CM | POA: Diagnosis not present

## 2023-07-23 DIAGNOSIS — R278 Other lack of coordination: Secondary | ICD-10-CM | POA: Diagnosis not present

## 2023-07-23 DIAGNOSIS — N186 End stage renal disease: Secondary | ICD-10-CM | POA: Diagnosis not present

## 2023-07-28 DIAGNOSIS — R293 Abnormal posture: Secondary | ICD-10-CM | POA: Diagnosis not present

## 2023-07-28 DIAGNOSIS — L089 Local infection of the skin and subcutaneous tissue, unspecified: Secondary | ICD-10-CM | POA: Diagnosis not present

## 2023-07-28 DIAGNOSIS — Z992 Dependence on renal dialysis: Secondary | ICD-10-CM | POA: Diagnosis not present

## 2023-07-28 DIAGNOSIS — N186 End stage renal disease: Secondary | ICD-10-CM | POA: Diagnosis not present

## 2023-07-28 DIAGNOSIS — R278 Other lack of coordination: Secondary | ICD-10-CM | POA: Diagnosis not present

## 2023-07-28 DIAGNOSIS — N19 Unspecified kidney failure: Secondary | ICD-10-CM | POA: Diagnosis not present

## 2023-07-28 DIAGNOSIS — M6281 Muscle weakness (generalized): Secondary | ICD-10-CM | POA: Diagnosis not present

## 2023-07-28 DIAGNOSIS — M6250 Muscle wasting and atrophy, not elsewhere classified, unspecified site: Secondary | ICD-10-CM | POA: Diagnosis not present

## 2023-07-28 DIAGNOSIS — Z8673 Personal history of transient ischemic attack (TIA), and cerebral infarction without residual deficits: Secondary | ICD-10-CM | POA: Diagnosis not present

## 2023-07-29 DIAGNOSIS — D509 Iron deficiency anemia, unspecified: Secondary | ICD-10-CM | POA: Diagnosis not present

## 2023-07-29 DIAGNOSIS — N19 Unspecified kidney failure: Secondary | ICD-10-CM | POA: Diagnosis not present

## 2023-07-29 DIAGNOSIS — E785 Hyperlipidemia, unspecified: Secondary | ICD-10-CM | POA: Diagnosis not present

## 2023-07-31 IMAGING — DX DG LUMBAR SPINE 2-3V
3 series · 3 of 3 positions shown · non-contrast
Comparison: 02/08/2021

11/21/2021

CLINICAL DATA: Low back pain radiating into the right gluteal
musculature. Sacroiliac joint dysfunction.

EXAM:
LUMBAR SPINE - 2-3 VIEW

[l-spine ap]
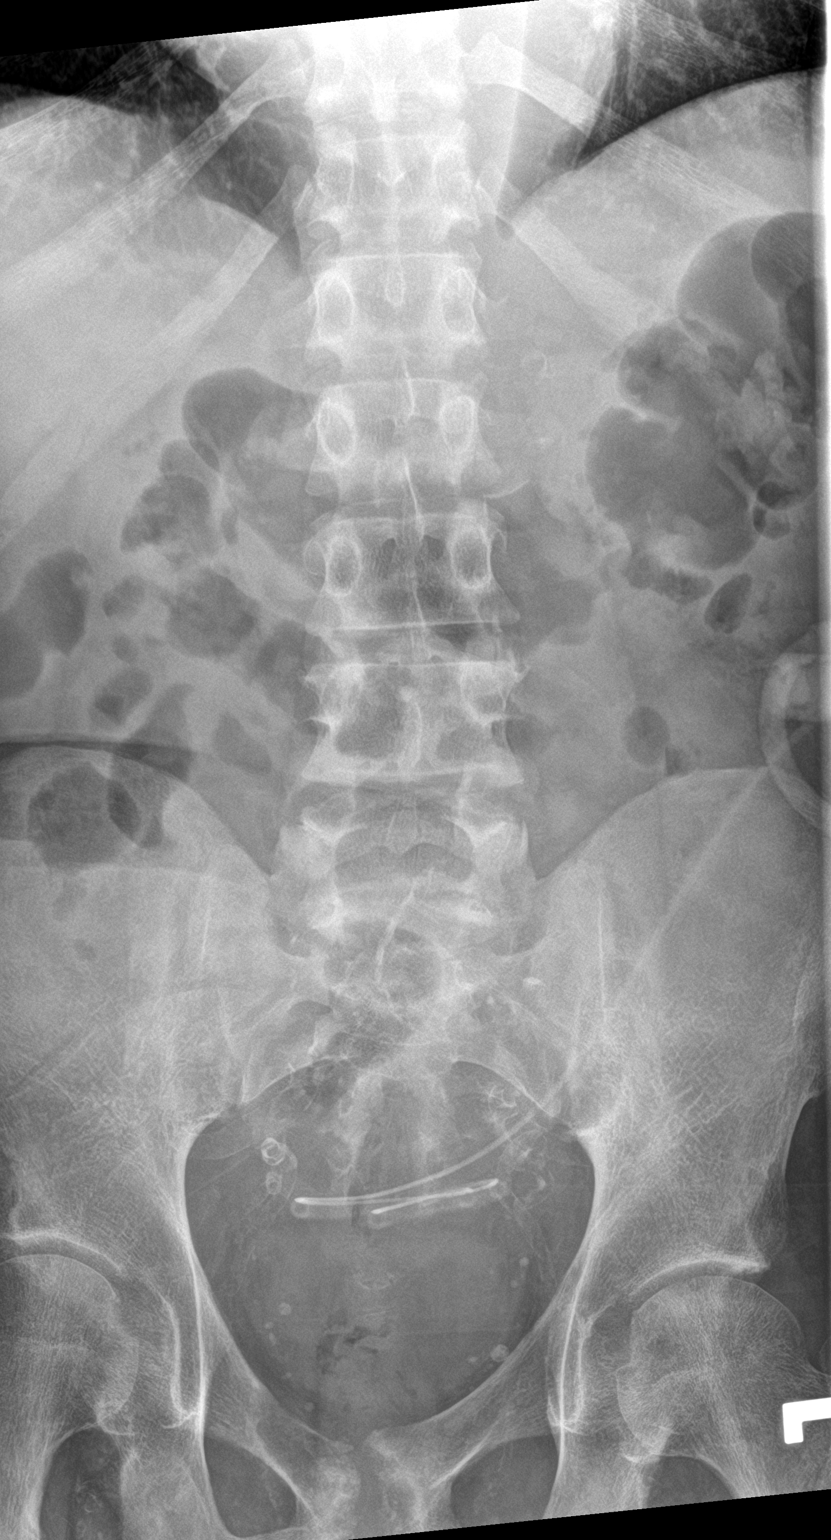

[l-spine lat]
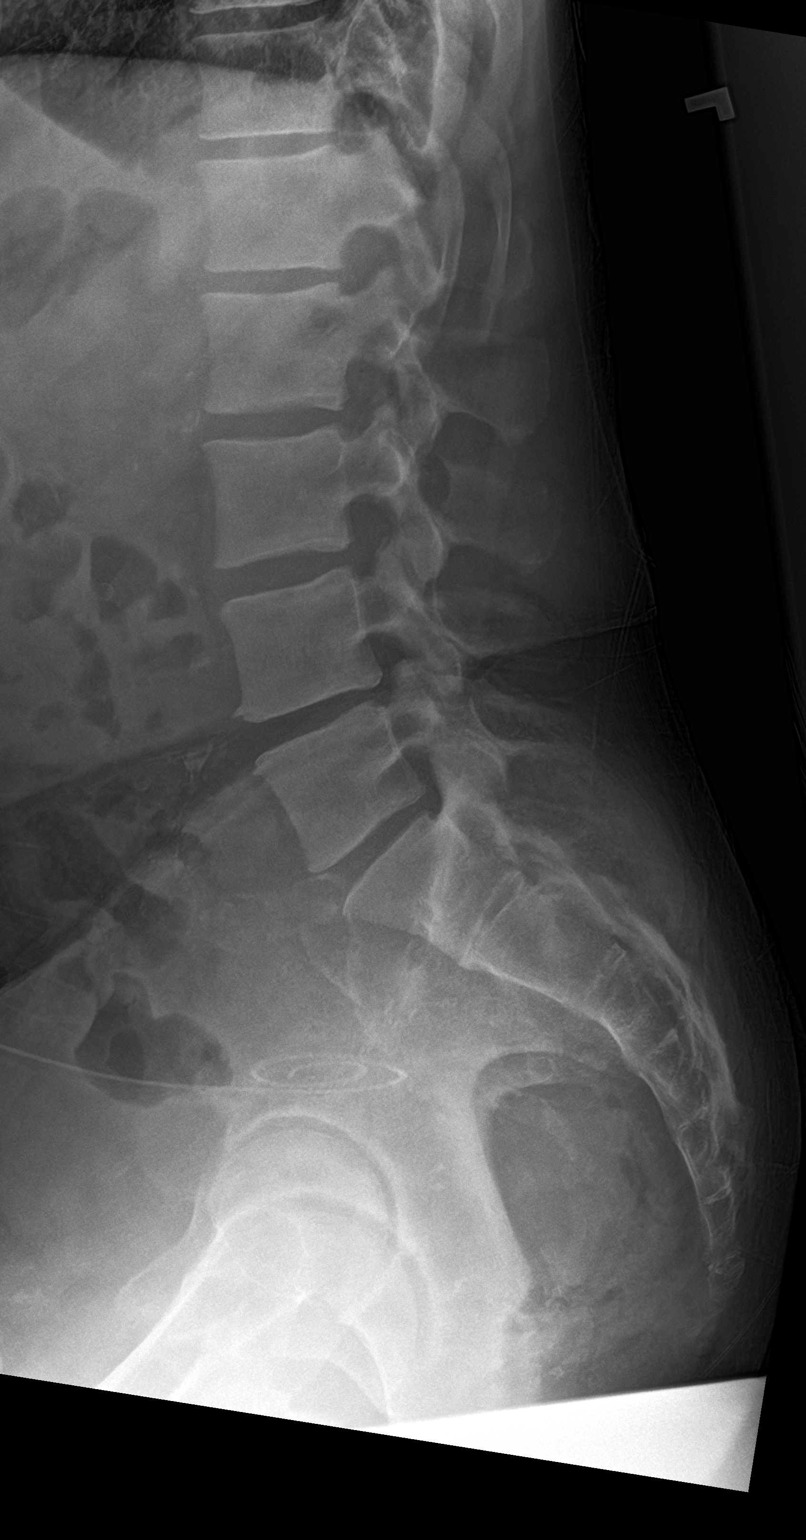

[l-spine spot]
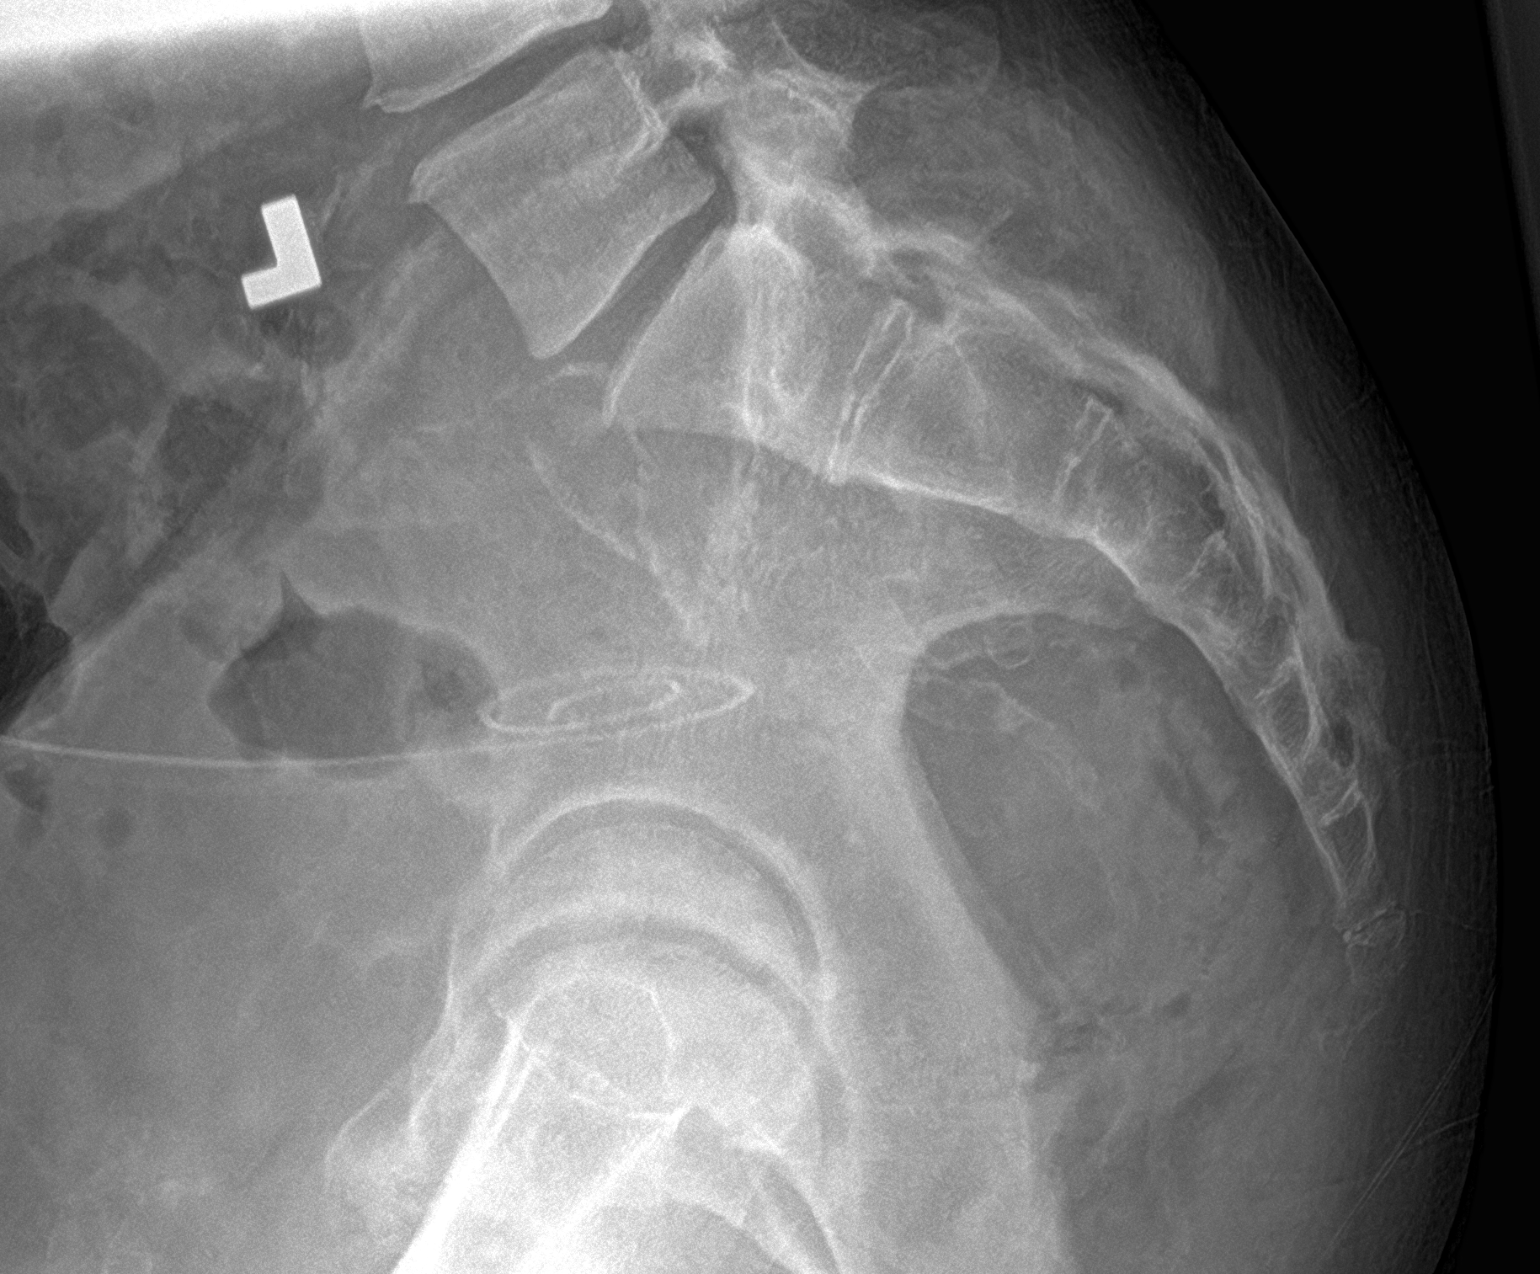

[3 of 3 positions shown; findings below may reference images not displayed]

FINDINGS: Peritoneal dialysis catheter terminates within the midline of the
lower pelvis.

Alignment of the lumbar spine is within normal limits. Mild
degenerative changes of the facets noted at L5-S1.

Erosive changes of the pubic symphysis with widening of the joint
space.
IMPRESSION: 1. Mild degenerative changes of the lower lumbar spine.
2. Erosive changes of the pubic symphysis with widening of the joint
space suspicious for inflammatory or infectious arthropathy. These
changes have progressed compared to prior pelvis radiograph from
02/08/2021. Further evaluation with MRI would be beneficial.

## 2023-08-03 DIAGNOSIS — N186 End stage renal disease: Secondary | ICD-10-CM | POA: Diagnosis not present

## 2023-08-03 DIAGNOSIS — I10 Essential (primary) hypertension: Secondary | ICD-10-CM | POA: Diagnosis not present

## 2023-08-04 DIAGNOSIS — F4320 Adjustment disorder, unspecified: Secondary | ICD-10-CM | POA: Diagnosis not present

## 2023-08-05 DIAGNOSIS — I70238 Atherosclerosis of native arteries of right leg with ulceration of other part of lower right leg: Secondary | ICD-10-CM | POA: Diagnosis not present

## 2023-08-05 DIAGNOSIS — M6281 Muscle weakness (generalized): Secondary | ICD-10-CM | POA: Diagnosis not present

## 2023-08-05 DIAGNOSIS — I70248 Atherosclerosis of native arteries of left leg with ulceration of other part of lower left leg: Secondary | ICD-10-CM | POA: Diagnosis not present

## 2023-08-11 DIAGNOSIS — F4320 Adjustment disorder, unspecified: Secondary | ICD-10-CM | POA: Diagnosis not present

## 2023-08-12 DIAGNOSIS — I70238 Atherosclerosis of native arteries of right leg with ulceration of other part of lower right leg: Secondary | ICD-10-CM | POA: Diagnosis not present

## 2023-08-12 DIAGNOSIS — M6281 Muscle weakness (generalized): Secondary | ICD-10-CM | POA: Diagnosis not present

## 2023-08-12 DIAGNOSIS — I70248 Atherosclerosis of native arteries of left leg with ulceration of other part of lower left leg: Secondary | ICD-10-CM | POA: Diagnosis not present

## 2023-08-18 DIAGNOSIS — F4320 Adjustment disorder, unspecified: Secondary | ICD-10-CM | POA: Diagnosis not present

## 2023-08-19 DIAGNOSIS — I70238 Atherosclerosis of native arteries of right leg with ulceration of other part of lower right leg: Secondary | ICD-10-CM | POA: Diagnosis not present

## 2023-08-19 DIAGNOSIS — I70248 Atherosclerosis of native arteries of left leg with ulceration of other part of lower left leg: Secondary | ICD-10-CM | POA: Diagnosis not present

## 2023-08-24 DIAGNOSIS — N186 End stage renal disease: Secondary | ICD-10-CM | POA: Diagnosis not present

## 2023-08-24 DIAGNOSIS — Z992 Dependence on renal dialysis: Secondary | ICD-10-CM | POA: Diagnosis not present

## 2023-08-24 DIAGNOSIS — Z8673 Personal history of transient ischemic attack (TIA), and cerebral infarction without residual deficits: Secondary | ICD-10-CM | POA: Diagnosis not present

## 2023-08-24 DIAGNOSIS — I10 Essential (primary) hypertension: Secondary | ICD-10-CM | POA: Diagnosis not present

## 2023-08-25 DIAGNOSIS — F4320 Adjustment disorder, unspecified: Secondary | ICD-10-CM | POA: Diagnosis not present

## 2023-08-26 DIAGNOSIS — I70248 Atherosclerosis of native arteries of left leg with ulceration of other part of lower left leg: Secondary | ICD-10-CM | POA: Diagnosis not present

## 2023-08-26 DIAGNOSIS — I70238 Atherosclerosis of native arteries of right leg with ulceration of other part of lower right leg: Secondary | ICD-10-CM | POA: Diagnosis not present

## 2023-08-28 DIAGNOSIS — Z992 Dependence on renal dialysis: Secondary | ICD-10-CM | POA: Diagnosis not present

## 2023-08-28 DIAGNOSIS — N186 End stage renal disease: Secondary | ICD-10-CM | POA: Diagnosis not present

## 2023-08-29 DIAGNOSIS — N19 Unspecified kidney failure: Secondary | ICD-10-CM | POA: Diagnosis not present

## 2023-08-29 DIAGNOSIS — L089 Local infection of the skin and subcutaneous tissue, unspecified: Secondary | ICD-10-CM | POA: Diagnosis not present

## 2023-08-29 DIAGNOSIS — M6250 Muscle wasting and atrophy, not elsewhere classified, unspecified site: Secondary | ICD-10-CM | POA: Diagnosis not present

## 2023-08-29 DIAGNOSIS — L89150 Pressure ulcer of sacral region, unstageable: Secondary | ICD-10-CM | POA: Diagnosis not present

## 2023-08-29 DIAGNOSIS — M6281 Muscle weakness (generalized): Secondary | ICD-10-CM | POA: Diagnosis not present

## 2023-08-29 DIAGNOSIS — Z8673 Personal history of transient ischemic attack (TIA), and cerebral infarction without residual deficits: Secondary | ICD-10-CM | POA: Diagnosis not present

## 2023-08-29 DIAGNOSIS — N186 End stage renal disease: Secondary | ICD-10-CM | POA: Diagnosis not present

## 2023-08-29 DIAGNOSIS — R278 Other lack of coordination: Secondary | ICD-10-CM | POA: Diagnosis not present

## 2023-08-29 DIAGNOSIS — E785 Hyperlipidemia, unspecified: Secondary | ICD-10-CM | POA: Diagnosis not present

## 2023-08-29 DIAGNOSIS — R293 Abnormal posture: Secondary | ICD-10-CM | POA: Diagnosis not present

## 2023-09-01 DIAGNOSIS — R278 Other lack of coordination: Secondary | ICD-10-CM | POA: Diagnosis not present

## 2023-09-01 DIAGNOSIS — N186 End stage renal disease: Secondary | ICD-10-CM | POA: Diagnosis not present

## 2023-09-01 DIAGNOSIS — Z8673 Personal history of transient ischemic attack (TIA), and cerebral infarction without residual deficits: Secondary | ICD-10-CM | POA: Diagnosis not present

## 2023-09-01 DIAGNOSIS — N19 Unspecified kidney failure: Secondary | ICD-10-CM | POA: Diagnosis not present

## 2023-09-01 DIAGNOSIS — M6250 Muscle wasting and atrophy, not elsewhere classified, unspecified site: Secondary | ICD-10-CM | POA: Diagnosis not present

## 2023-09-01 DIAGNOSIS — R293 Abnormal posture: Secondary | ICD-10-CM | POA: Diagnosis not present

## 2023-09-01 DIAGNOSIS — I10 Essential (primary) hypertension: Secondary | ICD-10-CM | POA: Diagnosis not present

## 2023-09-01 DIAGNOSIS — M6281 Muscle weakness (generalized): Secondary | ICD-10-CM | POA: Diagnosis not present

## 2023-09-01 DIAGNOSIS — L089 Local infection of the skin and subcutaneous tissue, unspecified: Secondary | ICD-10-CM | POA: Diagnosis not present

## 2023-09-02 DIAGNOSIS — L089 Local infection of the skin and subcutaneous tissue, unspecified: Secondary | ICD-10-CM | POA: Diagnosis not present

## 2023-09-02 DIAGNOSIS — R293 Abnormal posture: Secondary | ICD-10-CM | POA: Diagnosis not present

## 2023-09-02 DIAGNOSIS — I70248 Atherosclerosis of native arteries of left leg with ulceration of other part of lower left leg: Secondary | ICD-10-CM | POA: Diagnosis not present

## 2023-09-02 DIAGNOSIS — I70238 Atherosclerosis of native arteries of right leg with ulceration of other part of lower right leg: Secondary | ICD-10-CM | POA: Diagnosis not present

## 2023-09-02 DIAGNOSIS — M6250 Muscle wasting and atrophy, not elsewhere classified, unspecified site: Secondary | ICD-10-CM | POA: Diagnosis not present

## 2023-09-02 DIAGNOSIS — R278 Other lack of coordination: Secondary | ICD-10-CM | POA: Diagnosis not present

## 2023-09-02 DIAGNOSIS — Z8673 Personal history of transient ischemic attack (TIA), and cerebral infarction without residual deficits: Secondary | ICD-10-CM | POA: Diagnosis not present

## 2023-09-02 DIAGNOSIS — J069 Acute upper respiratory infection, unspecified: Secondary | ICD-10-CM | POA: Diagnosis not present

## 2023-09-02 DIAGNOSIS — N19 Unspecified kidney failure: Secondary | ICD-10-CM | POA: Diagnosis not present

## 2023-09-02 DIAGNOSIS — N186 End stage renal disease: Secondary | ICD-10-CM | POA: Diagnosis not present

## 2023-09-02 DIAGNOSIS — M6281 Muscle weakness (generalized): Secondary | ICD-10-CM | POA: Diagnosis not present

## 2023-09-03 DIAGNOSIS — F4320 Adjustment disorder, unspecified: Secondary | ICD-10-CM | POA: Diagnosis not present

## 2023-09-03 DIAGNOSIS — N186 End stage renal disease: Secondary | ICD-10-CM | POA: Diagnosis not present

## 2023-09-03 DIAGNOSIS — R278 Other lack of coordination: Secondary | ICD-10-CM | POA: Diagnosis not present

## 2023-09-03 DIAGNOSIS — M6250 Muscle wasting and atrophy, not elsewhere classified, unspecified site: Secondary | ICD-10-CM | POA: Diagnosis not present

## 2023-09-03 DIAGNOSIS — N19 Unspecified kidney failure: Secondary | ICD-10-CM | POA: Diagnosis not present

## 2023-09-03 DIAGNOSIS — M6281 Muscle weakness (generalized): Secondary | ICD-10-CM | POA: Diagnosis not present

## 2023-09-03 DIAGNOSIS — L089 Local infection of the skin and subcutaneous tissue, unspecified: Secondary | ICD-10-CM | POA: Diagnosis not present

## 2023-09-03 DIAGNOSIS — Z8673 Personal history of transient ischemic attack (TIA), and cerebral infarction without residual deficits: Secondary | ICD-10-CM | POA: Diagnosis not present

## 2023-09-03 DIAGNOSIS — R293 Abnormal posture: Secondary | ICD-10-CM | POA: Diagnosis not present

## 2023-09-04 DIAGNOSIS — R404 Transient alteration of awareness: Secondary | ICD-10-CM | POA: Diagnosis not present

## 2023-09-04 DIAGNOSIS — R9389 Abnormal findings on diagnostic imaging of other specified body structures: Secondary | ICD-10-CM | POA: Diagnosis not present

## 2023-09-04 DIAGNOSIS — Z4659 Encounter for fitting and adjustment of other gastrointestinal appliance and device: Secondary | ICD-10-CM | POA: Diagnosis not present

## 2023-09-04 DIAGNOSIS — E875 Hyperkalemia: Secondary | ICD-10-CM | POA: Diagnosis not present

## 2023-09-04 DIAGNOSIS — I629 Nontraumatic intracranial hemorrhage, unspecified: Secondary | ICD-10-CM | POA: Diagnosis not present

## 2023-09-04 DIAGNOSIS — I120 Hypertensive chronic kidney disease with stage 5 chronic kidney disease or end stage renal disease: Secondary | ICD-10-CM | POA: Diagnosis not present

## 2023-09-04 DIAGNOSIS — I610 Nontraumatic intracerebral hemorrhage in hemisphere, subcortical: Secondary | ICD-10-CM | POA: Diagnosis not present

## 2023-09-04 DIAGNOSIS — R4182 Altered mental status, unspecified: Secondary | ICD-10-CM | POA: Diagnosis not present

## 2023-09-04 DIAGNOSIS — I619 Nontraumatic intracerebral hemorrhage, unspecified: Secondary | ICD-10-CM | POA: Diagnosis not present

## 2023-09-04 DIAGNOSIS — E785 Hyperlipidemia, unspecified: Secondary | ICD-10-CM | POA: Diagnosis not present

## 2023-09-04 DIAGNOSIS — Z992 Dependence on renal dialysis: Secondary | ICD-10-CM | POA: Diagnosis not present

## 2023-09-04 DIAGNOSIS — R0989 Other specified symptoms and signs involving the circulatory and respiratory systems: Secondary | ICD-10-CM | POA: Diagnosis not present

## 2023-09-04 DIAGNOSIS — R000 Tachycardia, unspecified: Secondary | ICD-10-CM | POA: Diagnosis not present

## 2023-09-04 DIAGNOSIS — Z87891 Personal history of nicotine dependence: Secondary | ICD-10-CM | POA: Diagnosis not present

## 2023-09-04 DIAGNOSIS — I161 Hypertensive emergency: Secondary | ICD-10-CM | POA: Diagnosis not present

## 2023-09-04 DIAGNOSIS — I10 Essential (primary) hypertension: Secondary | ICD-10-CM | POA: Diagnosis not present

## 2023-09-04 DIAGNOSIS — G9341 Metabolic encephalopathy: Secondary | ICD-10-CM | POA: Diagnosis not present

## 2023-09-04 DIAGNOSIS — I6782 Cerebral ischemia: Secondary | ICD-10-CM | POA: Diagnosis not present

## 2023-09-04 DIAGNOSIS — Z136 Encounter for screening for cardiovascular disorders: Secondary | ICD-10-CM | POA: Diagnosis not present

## 2023-09-04 DIAGNOSIS — N186 End stage renal disease: Secondary | ICD-10-CM | POA: Diagnosis not present

## 2023-09-04 DIAGNOSIS — G936 Cerebral edema: Secondary | ICD-10-CM | POA: Diagnosis not present

## 2023-09-04 DIAGNOSIS — Z8673 Personal history of transient ischemic attack (TIA), and cerebral infarction without residual deficits: Secondary | ICD-10-CM | POA: Diagnosis not present

## 2023-09-04 DIAGNOSIS — R55 Syncope and collapse: Secondary | ICD-10-CM | POA: Diagnosis not present

## 2023-09-04 DIAGNOSIS — G9349 Other encephalopathy: Secondary | ICD-10-CM | POA: Diagnosis not present

## 2023-09-04 DIAGNOSIS — I615 Nontraumatic intracerebral hemorrhage, intraventricular: Secondary | ICD-10-CM | POA: Diagnosis not present

## 2023-09-04 DIAGNOSIS — Z91158 Patient's noncompliance with renal dialysis for other reason: Secondary | ICD-10-CM | POA: Diagnosis not present

## 2023-09-04 DIAGNOSIS — R410 Disorientation, unspecified: Secondary | ICD-10-CM | POA: Diagnosis not present

## 2023-09-05 DIAGNOSIS — I629 Nontraumatic intracranial hemorrhage, unspecified: Secondary | ICD-10-CM | POA: Diagnosis not present

## 2023-09-05 DIAGNOSIS — I618 Other nontraumatic intracerebral hemorrhage: Secondary | ICD-10-CM | POA: Diagnosis not present

## 2023-09-05 DIAGNOSIS — D631 Anemia in chronic kidney disease: Secondary | ICD-10-CM | POA: Diagnosis not present

## 2023-09-05 DIAGNOSIS — Z4659 Encounter for fitting and adjustment of other gastrointestinal appliance and device: Secondary | ICD-10-CM | POA: Diagnosis not present

## 2023-09-05 DIAGNOSIS — Z992 Dependence on renal dialysis: Secondary | ICD-10-CM | POA: Diagnosis not present

## 2023-09-05 DIAGNOSIS — I358 Other nonrheumatic aortic valve disorders: Secondary | ICD-10-CM | POA: Diagnosis not present

## 2023-09-05 DIAGNOSIS — I674 Hypertensive encephalopathy: Secondary | ICD-10-CM | POA: Diagnosis not present

## 2023-09-05 DIAGNOSIS — I120 Hypertensive chronic kidney disease with stage 5 chronic kidney disease or end stage renal disease: Secondary | ICD-10-CM | POA: Diagnosis not present

## 2023-09-05 DIAGNOSIS — R4182 Altered mental status, unspecified: Secondary | ICD-10-CM | POA: Diagnosis not present

## 2023-09-05 DIAGNOSIS — I161 Hypertensive emergency: Secondary | ICD-10-CM | POA: Diagnosis not present

## 2023-09-05 DIAGNOSIS — Z91158 Patient's noncompliance with renal dialysis for other reason: Secondary | ICD-10-CM | POA: Diagnosis not present

## 2023-09-05 DIAGNOSIS — E875 Hyperkalemia: Secondary | ICD-10-CM | POA: Diagnosis not present

## 2023-09-05 DIAGNOSIS — R410 Disorientation, unspecified: Secondary | ICD-10-CM | POA: Diagnosis not present

## 2023-09-05 DIAGNOSIS — I615 Nontraumatic intracerebral hemorrhage, intraventricular: Secondary | ICD-10-CM | POA: Diagnosis not present

## 2023-09-05 DIAGNOSIS — I619 Nontraumatic intracerebral hemorrhage, unspecified: Secondary | ICD-10-CM | POA: Diagnosis not present

## 2023-09-05 DIAGNOSIS — E871 Hypo-osmolality and hyponatremia: Secondary | ICD-10-CM | POA: Diagnosis not present

## 2023-09-05 DIAGNOSIS — G9341 Metabolic encephalopathy: Secondary | ICD-10-CM | POA: Diagnosis not present

## 2023-09-05 DIAGNOSIS — Z781 Physical restraint status: Secondary | ICD-10-CM | POA: Diagnosis not present

## 2023-09-05 DIAGNOSIS — R531 Weakness: Secondary | ICD-10-CM | POA: Diagnosis not present

## 2023-09-05 DIAGNOSIS — Z515 Encounter for palliative care: Secondary | ICD-10-CM | POA: Diagnosis not present

## 2023-09-05 DIAGNOSIS — I6782 Cerebral ischemia: Secondary | ICD-10-CM | POA: Diagnosis not present

## 2023-09-05 DIAGNOSIS — I639 Cerebral infarction, unspecified: Secondary | ICD-10-CM | POA: Diagnosis not present

## 2023-09-05 DIAGNOSIS — E8770 Fluid overload, unspecified: Secondary | ICD-10-CM | POA: Diagnosis not present

## 2023-09-05 DIAGNOSIS — G936 Cerebral edema: Secondary | ICD-10-CM | POA: Diagnosis not present

## 2023-09-05 DIAGNOSIS — G459 Transient cerebral ischemic attack, unspecified: Secondary | ICD-10-CM | POA: Diagnosis not present

## 2023-09-05 DIAGNOSIS — N186 End stage renal disease: Secondary | ICD-10-CM | POA: Diagnosis not present

## 2023-09-05 DIAGNOSIS — I739 Peripheral vascular disease, unspecified: Secondary | ICD-10-CM | POA: Diagnosis not present

## 2023-09-05 DIAGNOSIS — I610 Nontraumatic intracerebral hemorrhage in hemisphere, subcortical: Secondary | ICD-10-CM | POA: Diagnosis not present

## 2023-09-05 DIAGNOSIS — Z743 Need for continuous supervision: Secondary | ICD-10-CM | POA: Diagnosis not present

## 2023-09-05 DIAGNOSIS — R000 Tachycardia, unspecified: Secondary | ICD-10-CM | POA: Diagnosis not present

## 2023-09-05 DIAGNOSIS — Z8673 Personal history of transient ischemic attack (TIA), and cerebral infarction without residual deficits: Secondary | ICD-10-CM | POA: Diagnosis not present

## 2023-09-05 DIAGNOSIS — E785 Hyperlipidemia, unspecified: Secondary | ICD-10-CM | POA: Diagnosis not present

## 2023-09-05 DIAGNOSIS — E872 Acidosis, unspecified: Secondary | ICD-10-CM | POA: Diagnosis not present

## 2023-09-05 DIAGNOSIS — G9349 Other encephalopathy: Secondary | ICD-10-CM | POA: Diagnosis not present

## 2023-09-05 DIAGNOSIS — G935 Compression of brain: Secondary | ICD-10-CM | POA: Diagnosis not present

## 2023-09-05 DIAGNOSIS — I10 Essential (primary) hypertension: Secondary | ICD-10-CM | POA: Diagnosis not present

## 2023-09-05 LAB — LAB REPORT - SCANNED: EGFR: 7

## 2023-09-06 DIAGNOSIS — I120 Hypertensive chronic kidney disease with stage 5 chronic kidney disease or end stage renal disease: Secondary | ICD-10-CM | POA: Diagnosis not present

## 2023-09-06 DIAGNOSIS — G9349 Other encephalopathy: Secondary | ICD-10-CM | POA: Diagnosis not present

## 2023-09-06 DIAGNOSIS — G9341 Metabolic encephalopathy: Secondary | ICD-10-CM | POA: Diagnosis not present

## 2023-09-06 DIAGNOSIS — E872 Acidosis, unspecified: Secondary | ICD-10-CM | POA: Diagnosis not present

## 2023-09-06 DIAGNOSIS — N186 End stage renal disease: Secondary | ICD-10-CM | POA: Diagnosis not present

## 2023-09-06 DIAGNOSIS — I639 Cerebral infarction, unspecified: Secondary | ICD-10-CM | POA: Diagnosis not present

## 2023-09-06 DIAGNOSIS — Z992 Dependence on renal dialysis: Secondary | ICD-10-CM | POA: Diagnosis not present

## 2023-09-06 DIAGNOSIS — I739 Peripheral vascular disease, unspecified: Secondary | ICD-10-CM | POA: Diagnosis not present

## 2023-09-06 DIAGNOSIS — I610 Nontraumatic intracerebral hemorrhage in hemisphere, subcortical: Secondary | ICD-10-CM | POA: Diagnosis not present

## 2023-09-06 DIAGNOSIS — I674 Hypertensive encephalopathy: Secondary | ICD-10-CM | POA: Diagnosis not present

## 2023-09-06 DIAGNOSIS — I161 Hypertensive emergency: Secondary | ICD-10-CM | POA: Diagnosis not present

## 2023-09-06 DIAGNOSIS — G936 Cerebral edema: Secondary | ICD-10-CM | POA: Diagnosis not present

## 2023-09-06 DIAGNOSIS — E8770 Fluid overload, unspecified: Secondary | ICD-10-CM | POA: Diagnosis not present

## 2023-09-07 DIAGNOSIS — N186 End stage renal disease: Secondary | ICD-10-CM | POA: Diagnosis not present

## 2023-09-07 DIAGNOSIS — Z4659 Encounter for fitting and adjustment of other gastrointestinal appliance and device: Secondary | ICD-10-CM | POA: Diagnosis not present

## 2023-09-07 DIAGNOSIS — Z992 Dependence on renal dialysis: Secondary | ICD-10-CM | POA: Diagnosis not present

## 2023-09-07 DIAGNOSIS — I358 Other nonrheumatic aortic valve disorders: Secondary | ICD-10-CM | POA: Diagnosis not present

## 2023-09-07 DIAGNOSIS — I619 Nontraumatic intracerebral hemorrhage, unspecified: Secondary | ICD-10-CM | POA: Diagnosis not present

## 2023-09-08 DIAGNOSIS — N186 End stage renal disease: Secondary | ICD-10-CM | POA: Diagnosis not present

## 2023-09-08 DIAGNOSIS — Z4659 Encounter for fitting and adjustment of other gastrointestinal appliance and device: Secondary | ICD-10-CM | POA: Diagnosis not present

## 2023-09-08 DIAGNOSIS — I619 Nontraumatic intracerebral hemorrhage, unspecified: Secondary | ICD-10-CM | POA: Diagnosis not present

## 2023-09-08 DIAGNOSIS — Z992 Dependence on renal dialysis: Secondary | ICD-10-CM | POA: Diagnosis not present

## 2023-09-09 DIAGNOSIS — N186 End stage renal disease: Secondary | ICD-10-CM | POA: Diagnosis not present

## 2023-09-09 DIAGNOSIS — Z992 Dependence on renal dialysis: Secondary | ICD-10-CM | POA: Diagnosis not present

## 2023-09-09 DIAGNOSIS — I619 Nontraumatic intracerebral hemorrhage, unspecified: Secondary | ICD-10-CM | POA: Diagnosis not present

## 2023-09-10 DIAGNOSIS — N186 End stage renal disease: Secondary | ICD-10-CM | POA: Diagnosis not present

## 2023-09-10 DIAGNOSIS — I619 Nontraumatic intracerebral hemorrhage, unspecified: Secondary | ICD-10-CM | POA: Diagnosis not present

## 2023-09-10 DIAGNOSIS — I120 Hypertensive chronic kidney disease with stage 5 chronic kidney disease or end stage renal disease: Secondary | ICD-10-CM | POA: Diagnosis not present

## 2023-09-10 DIAGNOSIS — I615 Nontraumatic intracerebral hemorrhage, intraventricular: Secondary | ICD-10-CM | POA: Diagnosis not present

## 2023-09-10 DIAGNOSIS — G935 Compression of brain: Secondary | ICD-10-CM | POA: Diagnosis not present

## 2023-09-10 DIAGNOSIS — G9349 Other encephalopathy: Secondary | ICD-10-CM | POA: Diagnosis not present

## 2023-09-10 DIAGNOSIS — E871 Hypo-osmolality and hyponatremia: Secondary | ICD-10-CM | POA: Diagnosis not present

## 2023-09-10 DIAGNOSIS — G936 Cerebral edema: Secondary | ICD-10-CM | POA: Diagnosis not present

## 2023-09-10 DIAGNOSIS — I639 Cerebral infarction, unspecified: Secondary | ICD-10-CM | POA: Diagnosis not present

## 2023-09-10 DIAGNOSIS — I161 Hypertensive emergency: Secondary | ICD-10-CM | POA: Diagnosis not present

## 2023-09-10 DIAGNOSIS — I674 Hypertensive encephalopathy: Secondary | ICD-10-CM | POA: Diagnosis not present

## 2023-09-10 DIAGNOSIS — Z992 Dependence on renal dialysis: Secondary | ICD-10-CM | POA: Diagnosis not present

## 2023-09-11 DIAGNOSIS — N186 End stage renal disease: Secondary | ICD-10-CM | POA: Diagnosis not present

## 2023-09-11 DIAGNOSIS — I619 Nontraumatic intracerebral hemorrhage, unspecified: Secondary | ICD-10-CM | POA: Diagnosis not present

## 2023-09-11 DIAGNOSIS — Z992 Dependence on renal dialysis: Secondary | ICD-10-CM | POA: Diagnosis not present

## 2023-09-12 DIAGNOSIS — I619 Nontraumatic intracerebral hemorrhage, unspecified: Secondary | ICD-10-CM | POA: Diagnosis not present

## 2023-09-13 DIAGNOSIS — I619 Nontraumatic intracerebral hemorrhage, unspecified: Secondary | ICD-10-CM | POA: Diagnosis not present

## 2023-09-14 DIAGNOSIS — I120 Hypertensive chronic kidney disease with stage 5 chronic kidney disease or end stage renal disease: Secondary | ICD-10-CM | POA: Diagnosis not present

## 2023-09-14 DIAGNOSIS — I674 Hypertensive encephalopathy: Secondary | ICD-10-CM | POA: Diagnosis not present

## 2023-09-14 DIAGNOSIS — N186 End stage renal disease: Secondary | ICD-10-CM | POA: Diagnosis not present

## 2023-09-14 DIAGNOSIS — I161 Hypertensive emergency: Secondary | ICD-10-CM | POA: Diagnosis not present

## 2023-09-14 DIAGNOSIS — G935 Compression of brain: Secondary | ICD-10-CM | POA: Diagnosis not present

## 2023-09-14 DIAGNOSIS — G9349 Other encephalopathy: Secondary | ICD-10-CM | POA: Diagnosis not present

## 2023-09-14 DIAGNOSIS — Z992 Dependence on renal dialysis: Secondary | ICD-10-CM | POA: Diagnosis not present

## 2023-09-14 DIAGNOSIS — E871 Hypo-osmolality and hyponatremia: Secondary | ICD-10-CM | POA: Diagnosis not present

## 2023-09-14 DIAGNOSIS — I639 Cerebral infarction, unspecified: Secondary | ICD-10-CM | POA: Diagnosis not present

## 2023-09-14 DIAGNOSIS — I619 Nontraumatic intracerebral hemorrhage, unspecified: Secondary | ICD-10-CM | POA: Diagnosis not present

## 2023-09-14 DIAGNOSIS — G936 Cerebral edema: Secondary | ICD-10-CM | POA: Diagnosis not present

## 2023-09-15 DIAGNOSIS — N186 End stage renal disease: Secondary | ICD-10-CM | POA: Diagnosis not present

## 2023-09-15 DIAGNOSIS — I619 Nontraumatic intracerebral hemorrhage, unspecified: Secondary | ICD-10-CM | POA: Diagnosis not present

## 2023-09-15 LAB — LAB REPORT - SCANNED
Calcium: 6.5
EGFR: 8

## 2023-09-23 ENCOUNTER — Telehealth: Payer: Self-pay

## 2023-09-23 NOTE — Telephone Encounter (Signed)
 Caller Name: Marcell Anger Phone #:   [phone]  Date of death: 10/16/23 Place of death: [facility]

## 2023-09-26 DEATH — deceased
